# Patient Record
Sex: Female | Born: 1935
Health system: Southern US, Community
[De-identification: ages and names within clinical notes are randomized; demographics above are authoritative.]

## PROBLEM LIST (undated history)

## (undated) DIAGNOSIS — J4489 Other specified chronic obstructive pulmonary disease: Secondary | ICD-10-CM

## (undated) DIAGNOSIS — I10 Essential (primary) hypertension: Secondary | ICD-10-CM

## (undated) DIAGNOSIS — N904 Leukoplakia of vulva: Secondary | ICD-10-CM

## (undated) DIAGNOSIS — R1011 Right upper quadrant pain: Secondary | ICD-10-CM

## (undated) DIAGNOSIS — J449 Chronic obstructive pulmonary disease, unspecified: Secondary | ICD-10-CM

## (undated) DIAGNOSIS — C189 Malignant neoplasm of colon, unspecified: Secondary | ICD-10-CM

## (undated) DIAGNOSIS — M5030 Other cervical disc degeneration, unspecified cervical region: Secondary | ICD-10-CM

## (undated) DIAGNOSIS — G5680 Other specified mononeuropathies of unspecified upper limb: Secondary | ICD-10-CM

## (undated) DIAGNOSIS — B379 Candidiasis, unspecified: Secondary | ICD-10-CM

## (undated) DIAGNOSIS — M542 Cervicalgia: Secondary | ICD-10-CM

## (undated) DIAGNOSIS — M069 Rheumatoid arthritis, unspecified: Secondary | ICD-10-CM

## (undated) DIAGNOSIS — I4891 Unspecified atrial fibrillation: Secondary | ICD-10-CM

## (undated) HISTORY — DX: Right upper quadrant pain: R10.11

## (undated) HISTORY — DX: Candidiasis, unspecified: B37.9

## (undated) HISTORY — DX: Rheumatoid arthritis, unspecified: M06.9

## (undated) HISTORY — PX: APPENDECTOMY: SHX54

## (undated) HISTORY — DX: Leukoplakia of vulva: N90.4

## (undated) HISTORY — DX: Malignant neoplasm of colon, unspecified: C18.9

## (undated) HISTORY — DX: Other cervical disc degeneration, unspecified cervical region: M50.30

## (undated) HISTORY — DX: Cervicalgia: M54.2

## (undated) HISTORY — DX: Other specified mononeuropathies of unspecified upper limb: G56.80

---

## 1958-09-13 HISTORY — PX: ABDOMINAL HYSTERECTOMY: SHX81

## 2000-01-13 HISTORY — PX: CHOLECYSTECTOMY: SHX55

## 2001-05-18 ENCOUNTER — Ambulatory Visit (HOSPITAL_COMMUNITY): Admission: RE | Admit: 2001-05-18 | Discharge: 2001-05-18 | Payer: Self-pay | Admitting: Obstetrics and Gynecology

## 2001-05-18 ENCOUNTER — Encounter: Payer: Self-pay | Admitting: Obstetrics and Gynecology

## 2001-12-14 ENCOUNTER — Encounter: Payer: Self-pay | Admitting: Internal Medicine

## 2001-12-14 ENCOUNTER — Ambulatory Visit (HOSPITAL_COMMUNITY): Admission: RE | Admit: 2001-12-14 | Discharge: 2001-12-14 | Payer: Self-pay | Admitting: Internal Medicine

## 2002-05-22 ENCOUNTER — Ambulatory Visit (HOSPITAL_COMMUNITY): Admission: RE | Admit: 2002-05-22 | Discharge: 2002-05-22 | Payer: Self-pay | Admitting: Obstetrics and Gynecology

## 2002-05-22 ENCOUNTER — Encounter: Payer: Self-pay | Admitting: Obstetrics and Gynecology

## 2002-12-08 ENCOUNTER — Emergency Department (HOSPITAL_COMMUNITY): Admission: EM | Admit: 2002-12-08 | Discharge: 2002-12-09 | Payer: Self-pay | Admitting: *Deleted

## 2003-04-25 ENCOUNTER — Ambulatory Visit (HOSPITAL_COMMUNITY): Admission: RE | Admit: 2003-04-25 | Discharge: 2003-04-25 | Payer: Self-pay | Admitting: Internal Medicine

## 2003-10-10 ENCOUNTER — Ambulatory Visit (HOSPITAL_COMMUNITY): Admission: RE | Admit: 2003-10-10 | Discharge: 2003-10-10 | Payer: Self-pay | Admitting: Family Medicine

## 2003-10-11 ENCOUNTER — Ambulatory Visit (HOSPITAL_COMMUNITY): Admission: RE | Admit: 2003-10-11 | Discharge: 2003-10-11 | Payer: Self-pay | Admitting: Family Medicine

## 2003-10-13 HISTORY — PX: HEMICOLECTOMY: SHX854

## 2003-10-15 ENCOUNTER — Inpatient Hospital Stay (HOSPITAL_COMMUNITY): Admission: AD | Admit: 2003-10-15 | Discharge: 2003-10-21 | Payer: Self-pay | Admitting: Internal Medicine

## 2003-10-22 ENCOUNTER — Emergency Department (HOSPITAL_COMMUNITY): Admission: EM | Admit: 2003-10-22 | Discharge: 2003-10-22 | Payer: Self-pay | Admitting: Emergency Medicine

## 2003-11-08 ENCOUNTER — Encounter: Admission: RE | Admit: 2003-11-08 | Discharge: 2003-11-08 | Payer: Self-pay | Admitting: Oncology

## 2003-11-08 ENCOUNTER — Encounter (HOSPITAL_COMMUNITY): Admission: RE | Admit: 2003-11-08 | Discharge: 2003-12-08 | Payer: Self-pay | Admitting: Oncology

## 2003-11-19 ENCOUNTER — Ambulatory Visit (HOSPITAL_COMMUNITY): Admission: RE | Admit: 2003-11-19 | Discharge: 2003-11-19 | Payer: Self-pay | Admitting: Obstetrics and Gynecology

## 2004-01-22 ENCOUNTER — Encounter: Admission: RE | Admit: 2004-01-22 | Discharge: 2004-01-22 | Payer: Self-pay | Admitting: Oncology

## 2004-01-22 ENCOUNTER — Ambulatory Visit (HOSPITAL_COMMUNITY): Payer: Self-pay | Admitting: Oncology

## 2004-01-22 ENCOUNTER — Encounter (HOSPITAL_COMMUNITY): Admission: RE | Admit: 2004-01-22 | Discharge: 2004-02-21 | Payer: Self-pay | Admitting: Oncology

## 2004-05-07 ENCOUNTER — Encounter: Admission: RE | Admit: 2004-05-07 | Discharge: 2004-05-07 | Payer: Self-pay | Admitting: Oncology

## 2004-05-07 ENCOUNTER — Encounter (HOSPITAL_COMMUNITY): Admission: RE | Admit: 2004-05-07 | Discharge: 2004-06-06 | Payer: Self-pay | Admitting: Oncology

## 2004-05-07 ENCOUNTER — Ambulatory Visit (HOSPITAL_COMMUNITY): Payer: Self-pay | Admitting: Oncology

## 2004-08-06 ENCOUNTER — Encounter: Admission: RE | Admit: 2004-08-06 | Discharge: 2004-08-06 | Payer: Self-pay | Admitting: Oncology

## 2004-08-06 ENCOUNTER — Ambulatory Visit (HOSPITAL_COMMUNITY): Payer: Self-pay | Admitting: Oncology

## 2004-08-06 ENCOUNTER — Encounter (HOSPITAL_COMMUNITY): Admission: RE | Admit: 2004-08-06 | Discharge: 2004-09-05 | Payer: Self-pay | Admitting: Oncology

## 2004-10-29 ENCOUNTER — Ambulatory Visit (HOSPITAL_COMMUNITY): Payer: Self-pay | Admitting: Oncology

## 2004-10-29 ENCOUNTER — Encounter: Admission: RE | Admit: 2004-10-29 | Discharge: 2004-10-29 | Payer: Self-pay | Admitting: Oncology

## 2004-10-29 ENCOUNTER — Encounter (HOSPITAL_COMMUNITY): Admission: RE | Admit: 2004-10-29 | Discharge: 2004-11-28 | Payer: Self-pay | Admitting: Oncology

## 2004-11-20 ENCOUNTER — Ambulatory Visit (HOSPITAL_COMMUNITY): Admission: RE | Admit: 2004-11-20 | Discharge: 2004-11-20 | Payer: Self-pay | Admitting: Obstetrics and Gynecology

## 2004-12-07 ENCOUNTER — Emergency Department (HOSPITAL_COMMUNITY): Admission: EM | Admit: 2004-12-07 | Discharge: 2004-12-07 | Payer: Self-pay | Admitting: *Deleted

## 2005-01-21 ENCOUNTER — Encounter: Admission: RE | Admit: 2005-01-21 | Discharge: 2005-01-21 | Payer: Self-pay | Admitting: Oncology

## 2005-01-21 ENCOUNTER — Ambulatory Visit (HOSPITAL_COMMUNITY): Payer: Self-pay | Admitting: Oncology

## 2005-01-21 ENCOUNTER — Encounter (HOSPITAL_COMMUNITY): Admission: RE | Admit: 2005-01-21 | Discharge: 2005-02-20 | Payer: Self-pay | Admitting: Oncology

## 2005-05-07 ENCOUNTER — Ambulatory Visit (HOSPITAL_COMMUNITY): Payer: Self-pay | Admitting: Oncology

## 2005-05-07 ENCOUNTER — Encounter (HOSPITAL_COMMUNITY): Admission: RE | Admit: 2005-05-07 | Discharge: 2005-06-06 | Payer: Self-pay | Admitting: Oncology

## 2005-05-07 ENCOUNTER — Encounter: Admission: RE | Admit: 2005-05-07 | Discharge: 2005-05-07 | Payer: Self-pay | Admitting: Oncology

## 2005-06-04 ENCOUNTER — Ambulatory Visit (HOSPITAL_COMMUNITY): Admission: RE | Admit: 2005-06-04 | Discharge: 2005-06-04 | Payer: Self-pay | Admitting: Internal Medicine

## 2005-06-04 ENCOUNTER — Ambulatory Visit: Payer: Self-pay | Admitting: Internal Medicine

## 2005-06-04 HISTORY — PX: COLONOSCOPY: SHX174

## 2005-07-21 ENCOUNTER — Encounter (HOSPITAL_COMMUNITY): Admission: RE | Admit: 2005-07-21 | Discharge: 2005-08-20 | Payer: Self-pay | Admitting: Oncology

## 2005-07-21 ENCOUNTER — Ambulatory Visit (HOSPITAL_COMMUNITY): Payer: Self-pay | Admitting: Oncology

## 2005-07-21 ENCOUNTER — Encounter: Admission: RE | Admit: 2005-07-21 | Discharge: 2005-07-21 | Payer: Self-pay | Admitting: Oncology

## 2005-11-19 ENCOUNTER — Encounter (HOSPITAL_COMMUNITY): Admission: RE | Admit: 2005-11-19 | Discharge: 2005-12-19 | Payer: Self-pay | Admitting: Oncology

## 2005-11-19 ENCOUNTER — Encounter: Admission: RE | Admit: 2005-11-19 | Discharge: 2005-11-19 | Payer: Self-pay | Admitting: Oncology

## 2005-11-19 ENCOUNTER — Ambulatory Visit (HOSPITAL_COMMUNITY): Payer: Self-pay | Admitting: Oncology

## 2005-11-26 ENCOUNTER — Ambulatory Visit (HOSPITAL_COMMUNITY): Admission: RE | Admit: 2005-11-26 | Discharge: 2005-11-26 | Payer: Self-pay | Admitting: Family Medicine

## 2005-12-31 ENCOUNTER — Ambulatory Visit (HOSPITAL_COMMUNITY): Admission: RE | Admit: 2005-12-31 | Discharge: 2005-12-31 | Payer: Self-pay | Admitting: Family Medicine

## 2006-02-11 ENCOUNTER — Encounter (HOSPITAL_COMMUNITY): Admission: RE | Admit: 2006-02-11 | Discharge: 2006-03-13 | Payer: Self-pay | Admitting: Oncology

## 2006-02-11 ENCOUNTER — Ambulatory Visit (HOSPITAL_COMMUNITY): Payer: Self-pay | Admitting: Oncology

## 2006-05-18 ENCOUNTER — Ambulatory Visit (HOSPITAL_COMMUNITY): Payer: Self-pay | Admitting: Oncology

## 2006-05-18 ENCOUNTER — Encounter (HOSPITAL_COMMUNITY): Admission: RE | Admit: 2006-05-18 | Discharge: 2006-06-17 | Payer: Self-pay | Admitting: Oncology

## 2006-08-02 ENCOUNTER — Ambulatory Visit: Payer: Self-pay | Admitting: Critical Care Medicine

## 2006-08-03 ENCOUNTER — Ambulatory Visit: Payer: Self-pay | Admitting: Critical Care Medicine

## 2006-08-03 ENCOUNTER — Ambulatory Visit: Admission: RE | Admit: 2006-08-03 | Discharge: 2006-08-03 | Payer: Self-pay | Admitting: Critical Care Medicine

## 2006-08-05 ENCOUNTER — Encounter: Payer: Self-pay | Admitting: Critical Care Medicine

## 2006-08-05 ENCOUNTER — Ambulatory Visit: Admission: RE | Admit: 2006-08-05 | Discharge: 2006-08-05 | Payer: Self-pay | Admitting: Critical Care Medicine

## 2006-08-20 ENCOUNTER — Encounter (HOSPITAL_COMMUNITY): Admission: RE | Admit: 2006-08-20 | Discharge: 2006-09-19 | Payer: Self-pay | Admitting: Oncology

## 2006-08-20 ENCOUNTER — Ambulatory Visit (HOSPITAL_COMMUNITY): Payer: Self-pay | Admitting: Oncology

## 2006-08-31 ENCOUNTER — Ambulatory Visit: Payer: Self-pay | Admitting: Critical Care Medicine

## 2006-09-08 ENCOUNTER — Ambulatory Visit: Payer: Self-pay | Admitting: Internal Medicine

## 2006-11-29 ENCOUNTER — Encounter (HOSPITAL_COMMUNITY): Admission: RE | Admit: 2006-11-29 | Discharge: 2006-12-29 | Payer: Self-pay | Admitting: Oncology

## 2006-11-29 ENCOUNTER — Ambulatory Visit (HOSPITAL_COMMUNITY): Payer: Self-pay | Admitting: Oncology

## 2006-11-30 ENCOUNTER — Ambulatory Visit: Payer: Self-pay | Admitting: Critical Care Medicine

## 2006-11-30 DIAGNOSIS — J479 Bronchiectasis, uncomplicated: Secondary | ICD-10-CM

## 2006-11-30 DIAGNOSIS — M069 Rheumatoid arthritis, unspecified: Secondary | ICD-10-CM | POA: Insufficient documentation

## 2006-11-30 DIAGNOSIS — E785 Hyperlipidemia, unspecified: Secondary | ICD-10-CM | POA: Insufficient documentation

## 2007-02-28 ENCOUNTER — Encounter (HOSPITAL_COMMUNITY): Admission: RE | Admit: 2007-02-28 | Discharge: 2007-03-30 | Payer: Self-pay | Admitting: Oncology

## 2007-02-28 ENCOUNTER — Ambulatory Visit (HOSPITAL_COMMUNITY): Payer: Self-pay | Admitting: Oncology

## 2007-07-13 ENCOUNTER — Ambulatory Visit (HOSPITAL_COMMUNITY): Payer: Self-pay | Admitting: Oncology

## 2007-07-13 ENCOUNTER — Encounter (HOSPITAL_COMMUNITY): Admission: RE | Admit: 2007-07-13 | Discharge: 2007-08-12 | Payer: Self-pay | Admitting: Oncology

## 2007-10-05 ENCOUNTER — Encounter (HOSPITAL_COMMUNITY): Admission: RE | Admit: 2007-10-05 | Discharge: 2007-10-10 | Payer: Self-pay | Admitting: Oncology

## 2007-10-05 ENCOUNTER — Ambulatory Visit (HOSPITAL_COMMUNITY): Payer: Self-pay | Admitting: Oncology

## 2007-12-06 ENCOUNTER — Ambulatory Visit (HOSPITAL_COMMUNITY): Admission: RE | Admit: 2007-12-06 | Discharge: 2007-12-06 | Payer: Self-pay | Admitting: Obstetrics and Gynecology

## 2008-01-09 ENCOUNTER — Encounter (HOSPITAL_COMMUNITY): Admission: RE | Admit: 2008-01-09 | Discharge: 2008-02-08 | Payer: Self-pay | Admitting: Oncology

## 2008-01-09 ENCOUNTER — Ambulatory Visit (HOSPITAL_COMMUNITY): Payer: Self-pay | Admitting: Oncology

## 2008-04-09 ENCOUNTER — Encounter (HOSPITAL_COMMUNITY): Admission: RE | Admit: 2008-04-09 | Discharge: 2008-05-10 | Payer: Self-pay | Admitting: Oncology

## 2008-04-09 ENCOUNTER — Ambulatory Visit (HOSPITAL_COMMUNITY): Payer: Self-pay | Admitting: Oncology

## 2008-07-02 ENCOUNTER — Encounter (HOSPITAL_COMMUNITY): Admission: RE | Admit: 2008-07-02 | Discharge: 2008-08-01 | Payer: Self-pay | Admitting: Oncology

## 2008-07-02 ENCOUNTER — Ambulatory Visit (HOSPITAL_COMMUNITY): Payer: Self-pay | Admitting: Oncology

## 2008-08-24 ENCOUNTER — Inpatient Hospital Stay (HOSPITAL_COMMUNITY): Admission: EM | Admit: 2008-08-24 | Discharge: 2008-08-25 | Payer: Self-pay | Admitting: Emergency Medicine

## 2008-09-24 ENCOUNTER — Ambulatory Visit (HOSPITAL_COMMUNITY): Payer: Self-pay | Admitting: Oncology

## 2008-09-24 ENCOUNTER — Encounter (HOSPITAL_COMMUNITY): Admission: RE | Admit: 2008-09-24 | Discharge: 2008-10-10 | Payer: Self-pay | Admitting: Oncology

## 2008-12-10 ENCOUNTER — Ambulatory Visit (HOSPITAL_COMMUNITY): Admission: RE | Admit: 2008-12-10 | Discharge: 2008-12-10 | Payer: Self-pay | Admitting: Obstetrics and Gynecology

## 2009-02-15 ENCOUNTER — Ambulatory Visit (HOSPITAL_COMMUNITY): Payer: Self-pay | Admitting: Oncology

## 2009-02-15 ENCOUNTER — Encounter (HOSPITAL_COMMUNITY): Admission: RE | Admit: 2009-02-15 | Discharge: 2009-03-17 | Payer: Self-pay | Admitting: Oncology

## 2009-04-02 HISTORY — PX: COLONOSCOPY: SHX174

## 2009-05-08 ENCOUNTER — Encounter (HOSPITAL_COMMUNITY): Admission: RE | Admit: 2009-05-08 | Discharge: 2009-06-07 | Payer: Self-pay | Admitting: Oncology

## 2009-05-08 ENCOUNTER — Ambulatory Visit (HOSPITAL_COMMUNITY): Payer: Self-pay | Admitting: Oncology

## 2009-12-12 ENCOUNTER — Ambulatory Visit (HOSPITAL_COMMUNITY): Payer: Self-pay | Admitting: Oncology

## 2009-12-12 ENCOUNTER — Encounter (HOSPITAL_COMMUNITY)
Admission: RE | Admit: 2009-12-12 | Discharge: 2010-01-11 | Payer: Self-pay | Source: Home / Self Care | Attending: Oncology | Admitting: Oncology

## 2009-12-12 ENCOUNTER — Ambulatory Visit (HOSPITAL_COMMUNITY): Admission: RE | Admit: 2009-12-12 | Discharge: 2009-12-12 | Payer: Self-pay | Admitting: Family Medicine

## 2010-02-01 ENCOUNTER — Encounter: Payer: Self-pay | Admitting: Otolaryngology

## 2010-02-11 ENCOUNTER — Ambulatory Visit (HOSPITAL_COMMUNITY)
Admission: RE | Admit: 2010-02-11 | Discharge: 2010-02-11 | Payer: Self-pay | Source: Home / Self Care | Attending: Oncology | Admitting: Oncology

## 2010-02-11 ENCOUNTER — Encounter (HOSPITAL_COMMUNITY)
Admission: RE | Admit: 2010-02-11 | Discharge: 2010-02-11 | Payer: Self-pay | Source: Home / Self Care | Attending: Oncology | Admitting: Oncology

## 2010-02-14 ENCOUNTER — Ambulatory Visit (HOSPITAL_COMMUNITY): Payer: MEDICARE | Admitting: Oncology

## 2010-02-14 DIAGNOSIS — C189 Malignant neoplasm of colon, unspecified: Secondary | ICD-10-CM

## 2010-04-01 LAB — CEA: CEA: 0.8 ng/mL (ref 0.0–5.0)

## 2010-04-02 LAB — COMPREHENSIVE METABOLIC PANEL
ALT: 27 U/L (ref 0–35)
Alkaline Phosphatase: 45 U/L (ref 39–117)
CO2: 28 mEq/L (ref 19–32)
GFR calc non Af Amer: >60 mL/min (ref >60)
Glucose, Bld: 95 mg/dL (ref 70–99)
Potassium: 3.4 mEq/L — ABNORMAL LOW (ref 3.5–5.1)
Sodium: 139 mEq/L (ref 135–145)

## 2010-04-02 LAB — CBC
HCT: 39.8 % (ref 36.0–46.0)
Hemoglobin: 13.6 g/dL (ref 12.0–15.0)
RDW: 14.4 % (ref 11.5–15.5)

## 2010-04-02 LAB — DIFFERENTIAL
Basophils Relative: 1 % (ref 0–1)
Eosinophils Absolute: 0.2 10*3/uL (ref 0.0–0.7)
Neutrophils Relative %: 67 % (ref 43–77)

## 2010-04-18 LAB — CEA: CEA: 0.9 ng/mL (ref 0.0–5.0)

## 2010-04-20 LAB — DIFFERENTIAL
Basophils Absolute: 0 10*3/uL (ref 0.0–0.1)
Eosinophils Relative: 1 % (ref 0–5)
Lymphocytes Relative: 11 % — ABNORMAL LOW (ref 12–46)
Monocytes Absolute: 0.6 10*3/uL (ref 0.1–1.0)

## 2010-04-20 LAB — CBC
HCT: 35.5 % — ABNORMAL LOW (ref 36.0–46.0)
Hemoglobin: 12.2 g/dL (ref 12.0–15.0)
MCHC: 34.5 g/dL (ref 30.0–36.0)
MCV: 90.8 fL (ref 78.0–100.0)
Platelets: 214 10*3/uL (ref 150–400)
RDW: 14.5 % (ref 11.5–15.5)
WBC: 7.6 10*3/uL (ref 4.0–10.5)

## 2010-04-20 LAB — BASIC METABOLIC PANEL
BUN: 12 mg/dL (ref 6–23)
Calcium: 8.3 mg/dL — ABNORMAL LOW (ref 8.4–10.5)
Creatinine, Ser: 0.85 mg/dL (ref 0.4–1.2)
GFR calc non Af Amer: >60 mL/min (ref >60)
Glucose, Bld: 83 mg/dL (ref 70–99)
Sodium: 144 mEq/L (ref 135–145)

## 2010-04-20 LAB — COMPREHENSIVE METABOLIC PANEL
AST: 38 U/L — ABNORMAL HIGH (ref 0–37)
Albumin: 3.7 g/dL (ref 3.5–5.2)
Alkaline Phosphatase: 45 U/L (ref 39–117)
Chloride: 109 mEq/L (ref 96–112)
Creatinine, Ser: 0.79 mg/dL (ref 0.4–1.2)
GFR calc Af Amer: >60 mL/min (ref >60)
Potassium: 3.8 mEq/L (ref 3.5–5.1)
Total Bilirubin: 0.7 mg/dL (ref 0.3–1.2)

## 2010-04-20 LAB — CK TOTAL AND CKMB (NOT AT ARMC)
CK, MB: 2.7 ng/mL (ref 0.3–4.0)
Total CK: 81 U/L (ref 7–177)

## 2010-04-20 LAB — PROTIME-INR: Prothrombin Time: 13.4 seconds (ref 11.6–15.2)

## 2010-04-20 LAB — POCT CARDIAC MARKERS: Troponin i, poc: <0.05 ng/mL (ref 0.00–0.09)

## 2010-04-20 LAB — LIPID PANEL: VLDL: 25 mg/dL (ref 0–40)

## 2010-04-20 LAB — TROPONIN I: Troponin I: 0.03 ng/mL (ref 0.00–0.06)

## 2010-04-21 LAB — CEA: CEA: 1.2 ng/mL (ref 0.0–5.0)

## 2010-04-24 LAB — CEA: CEA: 1.5 ng/mL (ref 0.0–5.0)

## 2010-05-13 ENCOUNTER — Emergency Department (HOSPITAL_COMMUNITY): Admission: EM | Admit: 2010-05-13 | Payer: Medicare Other | Source: Home / Self Care

## 2010-05-13 ENCOUNTER — Encounter (HOSPITAL_COMMUNITY): Payer: Medicare Other | Attending: Oncology

## 2010-05-13 DIAGNOSIS — C189 Malignant neoplasm of colon, unspecified: Secondary | ICD-10-CM

## 2010-05-13 DIAGNOSIS — Z85038 Personal history of other malignant neoplasm of large intestine: Secondary | ICD-10-CM | POA: Insufficient documentation

## 2010-05-13 DIAGNOSIS — I10 Essential (primary) hypertension: Secondary | ICD-10-CM | POA: Insufficient documentation

## 2010-05-13 DIAGNOSIS — M069 Rheumatoid arthritis, unspecified: Secondary | ICD-10-CM | POA: Insufficient documentation

## 2010-05-13 DIAGNOSIS — Z79899 Other long term (current) drug therapy: Secondary | ICD-10-CM | POA: Insufficient documentation

## 2010-05-22 ENCOUNTER — Other Ambulatory Visit (HOSPITAL_COMMUNITY): Payer: Self-pay | Admitting: Family Medicine

## 2010-05-22 DIAGNOSIS — M069 Rheumatoid arthritis, unspecified: Secondary | ICD-10-CM

## 2010-05-27 ENCOUNTER — Ambulatory Visit (HOSPITAL_COMMUNITY)
Admission: RE | Admit: 2010-05-27 | Discharge: 2010-05-27 | Disposition: A | Discharge status: Home / Self Care | Payer: Medicare Other | Source: Ambulatory Visit | Attending: Family Medicine | Admitting: Family Medicine

## 2010-05-27 ENCOUNTER — Encounter (HOSPITAL_COMMUNITY): Payer: Self-pay

## 2010-05-27 DIAGNOSIS — IMO0002 Reserved for concepts with insufficient information to code with codable children: Secondary | ICD-10-CM | POA: Insufficient documentation

## 2010-05-27 DIAGNOSIS — M069 Rheumatoid arthritis, unspecified: Secondary | ICD-10-CM

## 2010-05-27 DIAGNOSIS — Z780 Asymptomatic menopausal state: Secondary | ICD-10-CM | POA: Insufficient documentation

## 2010-05-27 HISTORY — DX: Essential (primary) hypertension: I10

## 2010-05-27 NOTE — Op Note (Signed)
[name], [name]             ACCOUNT NO.:  [id number]   MEDICAL RECORD NO.:  [id number]          PATIENT TYPE:  AMB   LOCATION:  CARD                         FACILITY:  [facility]   PHYSICIAN:  Charlcie Cradle. Delford Field, MD, FCCPDATE OF BIRTH:  01-30-35   DATE OF PROCEDURE:  08/05/2006  DATE OF DISCHARGE:                               OPERATIVE REPORT   PROCEDURE PERFORMED:  Bronchoscopy.   CHIEF COMPLAINT:  Right lower lobe bronchiectasis, history of rheumatoid  arthritis and immunosuppressive therapy, methotrexate use, evaluate for  drug toxicity versus opportunistic infection with immunosuppression.   OPERATOR:  Charlcie Cradle. Delford Field, M.D., [facility].   ANESTHESIA:  1% Xylocaine local.   PREOPERATIVE MEDICATION:  Fentanyl 30 mcg, Versed 3 mg IV push.   PROCEDURE:  The video bronchoscope was introduced via the left naris.  The upper airways were visualized and were unremarkable.  The entire  tracheobronchial tree was visualized and revealed diffuse  tracheobronchitis with thin white secretions seen.  No endobronchial  lesions were seen.  Attention was then paid to the right lower lobe  medial basilar segment.  Bronchoalveolar lavage was obtained, 120 mL  volume infused, half this volume was returned. Transbronchial biopsies  x8 eight were obtained.   COMPLICATIONS:  None.   IMPRESSION:  Bronchiectasis right lower lobe with rheumatoid arthritis  and methotrexate use, evaluate for immunosuppressed infection versus  drug toxicity.   RECOMMENDATIONS:  Follow up microbiology and pathology.      Charlcie Cradle Delford Field, MD, [facility]  Electronically Signed     PEW/MEDQ  D:  08/05/2006  T:  08/05/2006  Job:  [id number]   cc:   Madelin Rear. Sherwood Gambler, MD  Fax: [phone]   Lemmie Evens, M.D.  Fax: [phone]

## 2010-05-27 NOTE — Assessment & Plan Note (Signed)
[facility]                             PULMONARY OFFICE NOTE   MECHELLE, PATES                      MRN:          [id number]  DATE:09/08/2006                            DOB:          1933-05-13    HISTORY:  A 77 year old white female, never smoker with documented  bronchiectasis by CT scan dated Jun 03, 2006 but normal lung function  tests on August 03, 2006 while on Symbicort 160/4.5 two puffs b.i.d. which  she feels has improved her breathing.  However, she continues to be  short of breath doing anything more than light housework for more than 5  or 10 minutes, after that she has to stop and rest.  She denies any  variability in terms of her symptoms with weather or environmental  changes and denies any significant fevers, chills, sweats or excessive  sputum production, pleuritic or exertional chest pain, orthopnea, PND or  leg swelling.   She comes in today acutely worse over the last week with tremors, rash,  face rash, tongue sore and chest discomfort that she has all read from  the package insert as side effects of taking Symbicort and stopped  medicine yesterday but is not sure she feels better yet.  She is mostly  concerned because it is making her break out.   MEDICATIONS:  Inventoried on the face sheet column dated September 08, 2006  and do include Toprol at a dose of 50 mg one whole daily and tapering  course of prednisone.   PHYSICAL EXAMINATION:  She is an anxious but pleasant ambulatory white  female in no acute distress.  She was afebrile, normal vital signs.  HEENT:  Unremarkable, oropharynx clear.  LUNGS:  Fields are completely clear bilaterally to auscultation and  percussion.  Regular rhythm without murmur, gallop, rub.  ABDOMEN:  Soft, benign.  EXTREMITIES:  Warm without calf tenderness, cyanosis, clubbing, edema.  Skin:  No rash seen.   IMPRESSION:  Possible adverse drug effect.  I think it is unlikely that  Symbicort is  the cause of all of her problems but she looks so good and  her lung functions are so normal I think it would be fine to stop the  Symbicort and then re-challenge her if she develops any symptoms of  dyspnea with Symbicort dose 160/4.5 one puff q.6prn dysnea (certainly  not an approved dose in the [place] but used this way in [place]  and can be at least tried this way) to see to what extent it helps her  with her symptoms or re-exacerbates or contributes to any of the  symptoms that she is describing.  That is, if she has no need for  Symbicort in terms of change in dyspnea on exertion then there is no  need to even re-challenge her, but if she does need it she can use it at  1 puff every 6 hours to see to what extent it helps her symptoms of  dyspnea and does or does not recreate some of the nonspecific symptoms  she complained about today.   I see no  evidence of thrush but did demonstrate for her optimal use of  the MDI and rinse and gargle after use.   I note that some of the nonspecific complaints she has are related to  tapering off of prednisone which I have encouraged her to continue under  Dr. Jeanine Luz direction and see her back ihere  right away f she notices a need for Symbicort but is truly intolerant of  the drug for alternative bronchodilator regimen.     Charlaine Dalton. Sherene Sires, MD, [facility]  Electronically Signed    MBW/MedQ  DD: 09/08/2006  DT: 09/09/2006  Job #: [id number]   cc:   Lemmie Evens, M.D.

## 2010-05-27 NOTE — Assessment & Plan Note (Signed)
[facility]                             PULMONARY OFFICE NOTE   [name], [name]                      MRN:          [id number]  DATE:08/31/2006                            DOB:          28-Jul-1933    Ms. [name] returns in followup a 77 year old white female history of  chronic dyspnea, bronchiectasis right lower lobe, underlying rheumatoid  arthritis, rule out __________ drug toxicity. Overall she has improved  since starting Symbicort 160/4.5 at 2 sprays b.i.d. She had undergone  bronchoscopy, results showed chronic inflammation, focal goblet cell  metaplasia, cultures were negative on the BAL. CT scan of the chest had  previously shown mild pleural parenchymal scarring and medial right  lower lobe associated bronchiectasis. There was no evidence on the  biopsy for drug induced toxicity. In the interim she has stopped the  chronic Ceftin therapy. She is maintaining;  1. Leflunomide at 20 mg daily.  2. Prednisone now 7.5 mg daily.  Other maintenance medicines are unchanged except that she is no longer  on tumeric or folic acid.   On exam, temperature 98, blood pressure 130/78, pulse 78, saturation 94%  room air.  CHEST: Showed to be clear except for a few scattered squeaks and expired  wheezes at the right base.  CARDIAC EXAM: Showed a regular rate and rhythm without S3, normal S1,  S2.  ABDOMEN: Soft, nontender.  EXTREMITIES: Showed no edema or clubbing.   IMPRESSION:  That of bronchiectasis with stable airflow function  positive response to Symbicort therapy. Plan is to maintain Symbicort as  is and return to this office in 3 months.     Charlcie Cradle Delford Field, MD, [facility]  Electronically Signed    PEW/MedQ  DD: 08/31/2006  DT: 09/01/2006  Job #: [id number]   cc:   Lemmie Evens, M.D.

## 2010-05-27 NOTE — Assessment & Plan Note (Signed)
[facility]                             PULMONARY OFFICE NOTE   RAQUELLE, PIETRO                      MRN:          [id number]  DATE:11/30/2006                            DOB:          1933-06-12    Ms. [name] is a 77 year old white female with a history of rheumatoid  arthritis, bronchiectasis, right lower lobe.  We had this patient on  Symbicort in August, but she developed a soreness to the tongue, in  which she had to stop this medication.  She states that her breathing is  doing well.  Her cough is improved.  She is currently having no  respiratory complaints.   PHYSICAL EXAMINATION:  Temp 98, blood pressure 132/76, pulse 80,  saturation 92% on room air.  CHEST:  Diminished breath sounds with prolonged expiratory phase.  No  wheeze or rhonchi noted.  CARDIAC:  Regular rate and rhythm without S3.  Normal S1 and S2.  ABDOMEN:  Soft, nontender.  EXTREMITIES:  No clubbing or edema.  SKIN:  Clear.   IMPRESSION:  Bronchiectasis, right lower lobe, with associated  rheumatoid arthritis.  Stable at this time.   PLAN:  Maintain off inhaled medications and return this patient for a  recheck in four months.     Charlcie Cradle Delford Field, MD, [facility]  Electronically Signed    PEW/MedQ  DD: 11/30/2006  DT: 11/30/2006  Job #: [id number]   cc:   Lemmie Evens, M.D.

## 2010-05-27 NOTE — Discharge Summary (Signed)
[name], [name]             ACCOUNT NO.:  [id number]   MEDICAL RECORD NO.:  [id number]          PATIENT TYPE:  INP   LOCATION:  2502                         FACILITY:  [facility]   PHYSICIAN:  Antonieta Iba, MD   DATE OF BIRTH:  06-May-1933   DATE OF ADMISSION:  08/24/2008  DATE OF DISCHARGE:  08/25/2008                               DISCHARGE SUMMARY   DISCHARGE DIAGNOSES:  1. Left shoulder and left arm pain secondary to rheumatoid arthritis.      a.     Negative myocardial infarction.      b.     Patent coronary arteries.  2. Rheumatoid arthritis.  3. Chronic steroid use.  4. Abnormal EKG with no cardiac reason found on cardiac      catheterization.   DISCHARGE CONDITION:  Improved.   PROCEDURES:  Combined left heart catheterization on August 24, 2008, by  Dr. Julieanne Manson with patent coronary arteries.   DISCHARGE MEDICATIONS:  1. Prednisone 5 mg daily.  2. Toprol-XL 50 mg daily.  3. Leflunomide 20 mg daily.  4. Aspirin 81 mg daily.   DISCHARGE INSTRUCTIONS:  1. Increase activity slowly.  May shower.  No lifting for 1 week.  No      driving for 2 days.  No sexual activity for 2 days.  2. Low-sodium, heart-healthy diet.  3. Wash cath site with soap and water.  Call if any bleeding,      swelling, or drainage.  4. Follow up with Dr. Regino Schultze next week.  Call for appointment.  5. Follow up with Dr. Mariah Milling if needed.   HOSPITAL COURSE:  The patient was admitted secondary to left shoulder  and left arm pain and abnormal EKG with T-wave inversion in V2-V5.  She  went to the Cath Lab more urgently and was found to have patent coronary  arteries and normal systolic function with EF of 60%.  She then  underwent CT of the chest to rule out PE and this again was negative for  pulmonary embolus, questionable acute viral or atypical pulmonary  infection, but she related she had problems with her lungs in the past  and actually had biopsy that was negative.   The patient  tolerated cardiac catheterization well by the next morning,  August 25, 2008, and she ambulated without problems.   PHYSICAL EXAMINATION:  VITAL SIGNS:  Blood pressure 140/70, pulse 66,  respirations 16, temp 97.9, and oxygen saturation 93% on room air.  HEART:  Regular rate and rhythm.  LUNGS:  Clear to auscultation with few diffuse rales.  ABDOMEN:  Soft and nontender.  EXTREMITIES: No edema.  Groin cath site stable.  NEURO:  Alert and oriented.  SKIN:  Warm and dry.   LABORATORY DATA:  Hemoglobin 12.2, hematocrit 35, platelets 192, and WBC  5.8.  TSH 0.764.  The patient was felt ready for discharge home and will  follow up as an outpatient with Dr. Regino Schultze and she can return to Dr.  Mariah Milling if any other cardiac issues arise.  We will be delighted to see  her.  Dr. Mariah Milling saw her  and discharged her home.      [name], N.P.      Antonieta Iba, MD  Electronically Signed    LRI/MEDQ  D:  08/25/2008  T:  08/25/2008  Job:  [id number]   cc:   Kirk Ruths, M.D.

## 2010-05-27 NOTE — H&P (Signed)
[name], [name]             ACCOUNT NO.:  [id number]   MEDICAL RECORD NO.:  [id number]          PATIENT TYPE:  EMS   LOCATION:  MAJO                         FACILITY:  [facility]   PHYSICIAN:  Cassandra Iba, MD   DATE OF BIRTH:  April 22, 1933   DATE OF ADMISSION:  08/24/2008  DATE OF DISCHARGE:                              HISTORY & PHYSICAL   CHIEF COMPLAINT:  Chest pain.   HISTORY OF PRESENT ILLNESS:  [name] is a pleasant 77 year old  female from [place] with no prior history of coronary disease.  She  is followed by [name].  She presented to [name] office today  with complaints of left shoulder and left arm pain.  She described this  as a dull ache.  She had taken Aleve at home without relief of her  symptoms.  At [name] office her EKG showed sinus rhythm with T-  wave inversion in V2 and V5.  This was a change from her previous EKGs.  Her EKG in the emergency room now shows a biphasic T-wave in V2, in V3  and V4.  There is no significant ST elevation.  The patient continues to  have some left arm pain.  She denies any associated nausea, vomiting,  diaphoresis or shortness of breath.   PAST MEDICAL HISTORY:  Remarkable for rheumatoid arthritis, she is  followed by [name].  She has a history of colon cancer and had  surgery in [place] in October of 2005.  She has treated hypertension.  She has had previous cholecystectomy, hysterectomy and appendectomy.   CURRENT MEDICATIONS:  1. Leflunomide 20 mg a day.  2. Toprol-XL 50 mg a day.  3. Prednisone 5 mg a day.   ALLERGIES:  She has no known drug allergies.   SOCIAL HISTORY:  She is married.  She never smoked.  She has 4 children,  11 grandchildren and 1 great-grandchild.  She lives in [place] with  her husband.   FAMILY HISTORY:  Is unremarkable for coronary disease, her father died  at an elderly age of complications of heart failure.   REVIEW OF SYSTEMS:  Essentially unremarkable  except for noted above.  She denies any GI bleeding or melena.  She has not had thyroid disease  or kidney disease.  She has not had palpitations, syncope or recent  fever or chills.  Her arm pain is not exacerbated by movement.  She does  admit to occasional red blood on her tissue which she attributes to  hemorrhoids.   PHYSICAL EXAMINATION:  VITAL SIGNS:  Blood pressure 143/63, pulse 75,  temperature 98.2, O2 is 96% on room air.  GENERAL:  She is a well-developed, well-nourished female in no acute  distress.  HEENT:  Normocephalic.  Extraocular movements are intact.  She wears  glasses.  NECK:  Without JVD.  She does have a soft short right carotid bruit.  CHEST:  Clear to auscultation and percussion.  CARDIAC:  Reveals regular rate and rhythm without obvious murmur, rub or  gallop.  Normal S1, S2.  ABDOMEN:  Nontender.  Nondistended.  EXTREMITIES:  Without  edema.  Distal pulses are 3+/4 bilaterally.  NEUROLOGICAL:  Exam is grossly intact.  She is awake, alert, oriented  and cooperative.  Moves all extremities without obvious deficit.  SKIN:  Skin is cool and dry.   LABS:  Are pending.   IMPRESSION:  1. Unstable angina.  2. Abnormal EKG.  3. Treated hypertension.  4. History of rheumatoid arthritis, on steroids, followed by Dr.      Jimmy Nguyen.  5. History of colon cancer, status post surgery October of 2005 in      [place].   PLAN:  The patient will be admitted to [facility] and started on heparin and  nitrates, beta-blocker, aspirin, statin and a proton pump inhibitor.  She will most likely require diagnostic catheterization.  She will be  seen today by [name] to making a final disposition.      Abelino Derrick, P.A.      Cassandra Iba, MD  Electronically Signed    LKK/MEDQ  D:  08/24/2008  T:  08/24/2008  Job:  [id number]   cc:   Cassandra Iba, MD

## 2010-05-27 NOTE — Cardiovascular Report (Signed)
[name], [name]             ACCOUNT NO.:  [id number]   MEDICAL RECORD NO.:  [id number]          PATIENT TYPE:  INP   LOCATION:  2502                         FACILITY:  [facility]   PHYSICIAN:  Thereasa Solo. Little, M.D. DATE OF BIRTH:  [birth year]   DATE OF PROCEDURE:  08/24/2008  DATE OF DISCHARGE:                            CARDIAC CATHETERIZATION   INDICATIONS FOR TEST:  This 77 year old female has a history of  rheumatoid arthritis and some rheumatoid lung involvement.  She awoke  this morning with the most severe shoulder and left arm pain she had  ever had.  She was seen urgently in Dr. Edison Simon office where an EKG  showed T-wave inversion in V2 through V5.  She came to the emergency  room here.  Her repeat cardiogram showed almost complete resolution of  the T-wave abnormalities, but she was still having 5/10 shoulder pain.   After obtaining informed consent, the patient was prepped and draped in  the usual sterile fashion exposing the right groin.  Following local  anesthetic with 1% Xylocaine, the Seldinger technique was employed, and  a 5-French introducer sheath was placed in the right femoral artery.  Left and right coronary arteriography and ventriculography in the RAO  projection was performed.   COMPLICATIONS:  None.   EQUIPMENT:  5-French Judkins configuration catheters.   TOTAL CONTRAST USED:  70 mL.   RESULTS:  1. Hemodynamic monitoring; central aortic pressure was 141/63.  Left      ventricular pressure was 142/3 and at the time of pullback, there      was no aortic valve gradient.  2. Ventriculography.  Ventriculography in the RAO projection performed      at the end of the procedure using 25 mL of contrast at 12 mL/sec      revealed normal LV systolic function with ejection fraction excess      of 60% and no mitral regurgitation.   Coronary arteriography:  Mild calcification seen in the left main,  proximal LAD, and proximal circ.   1. Left main normal.   It bifurcated.  2. Circumflex.  The circumflex gave rise to 2 medium size OM vessels,      both the OMs and the circumflex itself were free of disease.  3. LAD.  The LAD crossed the apex of the heart.  There was a diagonal      that came off in the midportion, it was a medium-sized vessel.      This entire system was free of disease.  4. Right coronary artery.  The right coronary artery including the PDA      and posterior lateral vessels were all free of disease.   CONCLUSION:  1. Normal left ventricular function.  2. No evidence of epicardial coronary artery disease.   I cannot explain her shoulder pain/arm pain secondary to her coronary  anatomy.  I suspect this is musculoskeletal/rheumatoid related.  Because  it is already 3 in the afternoon, I planned to keep her overnight, start  her on pain medications, check her shoulder x-ray tomorrow,  and we will  let her followup with  Dr. Regino Schultze and Dr. Jimmy Footman.           ______________________________  Thereasa Solo. Little, M.D.     ABL/MEDQ  D:  08/24/2008  T:  08/25/2008  Job:  [id number]   cc:   Madelin Rear. Sherwood Gambler, MD  Kirk Ruths, M.D.  Cath Lab

## 2010-05-27 NOTE — Assessment & Plan Note (Signed)
[facility]                             PULMONARY OFFICE NOTE   [name], [name]                      MRN:          [id number]  DATE:08/02/2006                            DOB:          12-05-1933    CHIEF COMPLAINT:  This 77 year old female here for second opinion on  pulmonary status.  She has been seen by Dr. Shaune Pollack, previously  diagnosed with bronchiectasis and placed on Ceftin 250 mg daily for the  first 7 days of each month for life.  Her first dose of this was earlier  this month.  She has a history of rheumatoid arthritis for which she  stopped methotrexate because of dyspnea in February of 2007 after 2-1/2  months therapy.  She has abnormal chest x-rays and referred for further  evaluation of this.  Currently her symptom is that she clears her throat  a lot.  Her shortness of breath is better but is with exertion only.  She is not shortness of breath at rest.  The cough is productive of  clear mucus.  She is also on Leflunomide, she has been on this for one  month.  Also on prednisone at 10 mg a day, not on any inhaled  medications.  Denies any chest pain.  Mucus is light but occasionally  green.  She does have rheumatoid arthritis involving all joints.  She is  a lifelong never smoker.  She denies any acid reflux, no loss of  appetite, weight change, abdominal pain, difficulty swallowing.  She  denies sore throat, nasal congestion, sneezing, itching, ear ache,  anxiety or depression.  She is referred for evaluation of pulmonary  status.   PAST MEDICAL HISTORY:  1. Hypertension.  2. Hypercholesterolemia.  3. History of colon cancer 2005.   OPERATIVE HISTORY:  1. Cholecystectomy in 1995.  2. Colon surgery 2005.  3. Partial hysterectomy in 1967.  4. Fractured hand in the past and arthritis as noted.   MEDICATION ALLERGIES:  NONE.   CURRENT MEDICATIONS:  1. Leflunomide 20 mg daily.  2. Ceftin 250 mg 2 tablets 7 days of each  month.  3. Prednisone 10 mg daily.  4. Zetia 5 mg daily.  5. Toprol XL 50 mg daily.  6. Folic acid 1 mg daily.  7. Vitamin D a week every other week.  8. Calcium.  9. Omega 3.  10.Tumeric daily.   SOCIAL HISTORY:  Lifelong never smoker, retired, lives at home with her  husband.   FAMILY HISTORY:  Asthma in a sister, heart disease in her father.   REVIEW OF SYSTEMS:  Otherwise noncontributory.   PHYSICAL EXAMINATION:  This is a mildly obese white female in no  distress.  Temperature 98, blood pressure 158/90, pulse 77, saturation 94% room  air, weight 182 pounds.  CHEST:  Showed a few expiratory squeaks in the right lower lung zone, no  other wheeze is determined.  CARDIAC:  Showed a regular rate and rhythm, without S3, normal S1, S2.  ABDOMEN:  Soft, nontender.  EXTREMITIES:  Showed no edema or clubbing.  SKIN:  Clear.  NEUROLOGIC:  Intact.  HEENT:  Showed no jugular venous distention, no lymphadenopathy,  oropharynx clear.  NECK:  Supple.   LABORATORY DATA:  Chest CT from May 2008 revealed bronchiectatic changes  right middle and right lower lobe.  No other acute infiltrates seen.  There is no evidence of metastatic disease.  There is mild pleural  parenchymal scarring in the right lower lobe noted.  White count from  May 2008 showed white count 8700, hemoglobin 13.9.  Liver functions  unremarkable.  Strongly positive anti-CCP antibody, greater than 100 is  noted from May 2008.  In June 2008 she had a hemoglobin of 13.4, white  count 11.6.  The CMET is unremarkable in June 2008 with the exception of  serum blood sugar 161.  Antimyeloperoxidase antibody is 1 which is  essentially negative.  The ANCA titers were negative.  The CCP antibody  again was positive.  No pulmonary functions have been yet obtained.   IMPRESSION:  That of bronchiectasis right middle and lower lobe with  associated scarring.  In this patient we need to evaluate for  immunosuppressed state with  rheumatoid arthritis treatment versus  toxicity from current medications including Leflunomide.  Also need to  evaluate for chronic colonization with mycobacterium avium  intracellulare.   PLAN:  Pursue pulmonary functions on August 03, 2006 and pursue  bronchoscopy on August 05, 2006.  Once these results are available,  further recommendations would follow.  No change in medications were  made today.     Charlcie Cradle Delford Field, MD, [facility]  Electronically Signed    PEW/MedQ  DD: 08/03/2006  DT: 08/03/2006  Job #: [id number]   cc:   Lemmie Evens, M.D.

## 2010-05-30 NOTE — Procedures (Signed)
NAMEJEREMIE, [name]             ACCOUNT NO.:  [id number]   MEDICAL RECORD NO.:  [id number]          PATIENT TYPE:  OUT   LOCATION:  RAD                           FACILITY:  [facility]   PHYSICIAN:  Nicki Guadalajara, M.D.     DATE OF BIRTH:  Feb 05, 1933   DATE OF PROCEDURE:  10/11/2003  DATE OF DISCHARGE:                                  ECHOCARDIOGRAM   PROCEDURE:  2-D echo Doppler study.   INDICATIONS:  [name] is a 77 year old patient of Dr. Phillips Odor  who has recently noted increasing episodes of dyspnea on exertion and  shortness of breath.  The patient is referred for a 2-D Doppler study to  assess LV size, wall thickness and valvular architecture.   FINDINGS:  1.  Technically adequate M-mode 2-Dimensional comprehensive Doppler      echocardiogram.  2.  There is evidence for concentric left ventricular hypertrophy with wall      thickness measuring 1.4 cm septally and 1.2 cm posteriorly.  Systolic      function is normal.  End-diastolic and end-systolic dimensions are      normal at 4.2 and 2.6 cm, respectively.  Diastolic parameters appeared      normal.  3.  Left atrial dimension is mildly increased at 4.4 cm.  The right atrium      is upper normal in size.  The right ventricle is normal size and      function.  4.  The aortic root dimension is normal at 3.2 cm.  5.  There is minimal sclerosis of a trileaflet aortic valve.  Systolic      excursion is excellent.  There is mild aortic insufficiency.  6.  There is a predominant moderate posterior mitral annular calcification      of the mitral valve.  Leaflets appeared minimally thickened, but had      normal excursion.  Frank mitral valve prolapse was not demonstrated.      There was mild mitral regurgitation.  7.  There was mild tricuspid regurgitation.  8.  Pulmonic valve was structurally normal.  9.  There were no intramyocardial masses, thrombi or effusions seen.   IMPRESSION:  Technically, this was an adequate echo  Doppler study  demonstrating mild concentric left ventricular hypertrophy with normal left  ventricular systolic as well as diastolic function.  There is very mild  aortic valve sclerosis with mild aortic insufficiency.  There is mitral  annular calcification with mild mitral regurgitation as well as mild left  atrial enlargement, and mild tricuspid regurgitation.      TK/MEDQ  D:  10/11/2003  T:  10/11/2003  Job:  [id number]   cc:   Corrie Mckusick, M.D.  [address] [place]. [place]  [place]  [place] [address]  Fax: [phone]

## 2010-08-01 ENCOUNTER — Other Ambulatory Visit (HOSPITAL_COMMUNITY): Payer: Self-pay | Admitting: Family Medicine

## 2010-08-01 DIAGNOSIS — M5090 Cervical disc disorder, unspecified, unspecified cervical region: Secondary | ICD-10-CM

## 2010-08-04 ENCOUNTER — Ambulatory Visit (HOSPITAL_COMMUNITY)
Admission: RE | Admit: 2010-08-04 | Discharge: 2010-08-04 | Disposition: A | Discharge status: Home / Self Care | Payer: Medicare Other | Source: Ambulatory Visit | Attending: Family Medicine | Admitting: Family Medicine

## 2010-08-04 DIAGNOSIS — M5020 Other cervical disc displacement, unspecified cervical region: Secondary | ICD-10-CM | POA: Insufficient documentation

## 2010-08-04 DIAGNOSIS — M542 Cervicalgia: Secondary | ICD-10-CM | POA: Insufficient documentation

## 2010-08-04 DIAGNOSIS — M5090 Cervical disc disorder, unspecified, unspecified cervical region: Secondary | ICD-10-CM

## 2010-08-05 ENCOUNTER — Encounter (HOSPITAL_COMMUNITY): Payer: Medicare Other | Attending: Oncology

## 2010-08-05 DIAGNOSIS — Z79899 Other long term (current) drug therapy: Secondary | ICD-10-CM | POA: Insufficient documentation

## 2010-08-05 DIAGNOSIS — I10 Essential (primary) hypertension: Secondary | ICD-10-CM | POA: Insufficient documentation

## 2010-08-05 DIAGNOSIS — Z85038 Personal history of other malignant neoplasm of large intestine: Secondary | ICD-10-CM | POA: Insufficient documentation

## 2010-08-05 DIAGNOSIS — C189 Malignant neoplasm of colon, unspecified: Secondary | ICD-10-CM

## 2010-08-05 DIAGNOSIS — M069 Rheumatoid arthritis, unspecified: Secondary | ICD-10-CM | POA: Insufficient documentation

## 2010-08-05 NOTE — Progress Notes (Signed)
Labs drawn today for cea 

## 2010-08-07 ENCOUNTER — Other Ambulatory Visit: Payer: Self-pay | Admitting: Neurosurgery

## 2010-08-07 DIAGNOSIS — M47812 Spondylosis without myelopathy or radiculopathy, cervical region: Secondary | ICD-10-CM

## 2010-08-12 ENCOUNTER — Ambulatory Visit
Admission: RE | Admit: 2010-08-12 | Discharge: 2010-08-12 | Disposition: A | Discharge status: Home / Self Care | Payer: Medicare Other | Source: Ambulatory Visit | Attending: Neurosurgery | Admitting: Neurosurgery

## 2010-08-12 ENCOUNTER — Observation Stay (HOSPITAL_COMMUNITY)
Admission: EM | Admit: 2010-08-12 | Discharge: 2010-08-13 | Disposition: A | Discharge status: Home / Self Care | Payer: Medicare Other | Attending: Internal Medicine | Admitting: Internal Medicine

## 2010-08-12 VITALS — BP 63/38 | HR 61 | Resp 22

## 2010-08-12 DIAGNOSIS — M129 Arthropathy, unspecified: Secondary | ICD-10-CM | POA: Insufficient documentation

## 2010-08-12 DIAGNOSIS — H8110 Benign paroxysmal vertigo, unspecified ear: Secondary | ICD-10-CM | POA: Insufficient documentation

## 2010-08-12 DIAGNOSIS — T782XXA Anaphylactic shock, unspecified, initial encounter: Principal | ICD-10-CM | POA: Insufficient documentation

## 2010-08-12 DIAGNOSIS — M47812 Spondylosis without myelopathy or radiculopathy, cervical region: Secondary | ICD-10-CM

## 2010-08-12 DIAGNOSIS — K219 Gastro-esophageal reflux disease without esophagitis: Secondary | ICD-10-CM | POA: Insufficient documentation

## 2010-08-12 DIAGNOSIS — I10 Essential (primary) hypertension: Secondary | ICD-10-CM | POA: Insufficient documentation

## 2010-08-12 DIAGNOSIS — Z85038 Personal history of other malignant neoplasm of large intestine: Secondary | ICD-10-CM | POA: Insufficient documentation

## 2010-08-12 LAB — DIFFERENTIAL
Basophils Absolute: 0 10*3/uL (ref 0.0–0.1)
Basophils Relative: 0 % (ref 0–1)
Eosinophils Absolute: 0.1 10*3/uL (ref 0.0–0.7)
Monocytes Relative: 5 % (ref 3–12)
Neutrophils Relative %: 46 % (ref 43–77)

## 2010-08-12 LAB — BASIC METABOLIC PANEL
CO2: 21 mEq/L (ref 19–32)
Calcium: 9 mg/dL (ref 8.4–10.5)
GFR calc non Af Amer: >60 mL/min (ref >60)
Sodium: 138 mEq/L (ref 135–145)

## 2010-08-12 LAB — CBC
MCH: 29.9 pg (ref 26.0–34.0)
Platelets: 210 10*3/uL (ref 150–400)
RBC: 4.81 MIL/uL (ref 3.87–5.11)

## 2010-08-12 MED ORDER — DIPHENHYDRAMINE HCL 25 MG PO CAPS
50.0000 mg | ORAL_CAPSULE | Freq: Once | ORAL | Status: AC
  Administered 2010-08-12: 50 mg via ORAL

## 2010-08-12 MED ORDER — EPINEPHRINE HCL 1 MG/ML IJ SOLN
0.2000 mg | Freq: Once | INTRAMUSCULAR | Status: AC
  Administered 2010-08-12: 0.2 mg via SUBCUTANEOUS

## 2010-08-12 MED ORDER — IOHEXOL 300 MG/ML  SOLN
1.0000 mL | Freq: Once | INTRAMUSCULAR | Status: AC | PRN
  Administered 2010-08-12: 1 mL via EPIDURAL

## 2010-08-12 MED ORDER — TRIAMCINOLONE ACETONIDE 40 MG/ML IJ SUSP (RADIOLOGY)
60.0000 mg | Freq: Once | INTRAMUSCULAR | Status: AC
  Administered 2010-08-12: 60 mg via EPIDURAL

## 2010-08-12 NOTE — Progress Notes (Signed)
Received pt to nursing station approx 0930 after CEPI.  Within one to two minutes patient began complaining of itching on arms, trunk.  Rash over these areas developed within two minutes.  Benadryl 50mg  PO given 0934.  By 1610 patient unresponsive in chair with eyes rolled back in head to upper right, jerking gently all over.  Assistance summoned; chair reclined in shock postion.  O2 added at 2L/min/[place] for O2 sats 88%.  Dr. Alfredo Batty at patient's side; patient responsive and pale within a minute of lying flat.  Epinephrine 1:1000, 0.2cc given SQ right arm.  EKG at [phone] showed SR with frequent PACs, rare PVCs, possible AF runs versus artifact.  Unable to obtain strip.  NS 250cc hung wide open through 22g  PIV left AC space.  O2 increased to 4L; sats remained mid-80's.  EMS arrived quickly; left with patient to [facility] ED at [phone].  Pt's husband given purse and shirt; Dr. Alfredo Batty had brought him in to see patient and to speak with him around 0945.  Details given to [facility], RN in ED approx 1040, including procedure and contents of CEPI.  jkl

## 2010-08-15 NOTE — H&P (Signed)
NAMEVERDIE, WILMS NO.:  [id number]  MEDICAL RECORD NO.:  [id number]  LOCATION:  3743                         FACILITY:  [facility]  PHYSICIAN:  Candelaria Celeste, DO      DATE OF BIRTH:  [birth year]  DATE OF ADMISSION:  08/12/2010 DATE OF DISCHARGE:                             HISTORY & PHYSICAL   PRIMARY CARE PROVIDER:  Corrie Mckusick, MD  CHIEF COMPLAINT:  Anaphylaxis with hypertension.  Ms. Shands is a 77 year old Caucasian female who presents with a past medical history including arthritis, hypertension, history of colon cancer status post colectomy and reflux who was brought to the emergency department by EMS after an anaphylactic reaction.  [facility] and undergoing a epidural injection to the cervical spine which contained 1 mL each of Kenalog IV contrast and lidocaine. The patient underwent the injection and approximately 5 minutes later, had acute onset of itching rash.  The patient was unresponsive for about 30 seconds.  The patient was given 50 mg of Benadryl and one dose of an EpiPen by EMS.  The patient did have hypertension to 60-80 systolically. The patient had 1-2 more doses of epinephrine in the emergency department to keep her blood pressure stabilized.  The patient currently denies symptoms of itching, rash, lightheadedness, chest pain, shortness of breath, nausea, abdominal pain.  She does have mild dizziness at the present time.  PAST MEDICAL HISTORY: 1. Hypertension. 2. Arthritis. 3. Gastroesophageal reflux disease. 4. History of colon cancer status post colectomy. 5. Appendectomy. 6. Cholecystectomy. 7. Hysterectomy. 8. Cataract removal of the left eye. 9. Right hand surgery for fracture. 10.Partial colectomy.  MEDICATIONS: 1. Metoprolol 50 mg daily. 2. Nexium 40 mg twice daily. 3. Hydrocodone/Tylenol 7.5/325 one tablet every 4 hours as needed. 4. Lisinopril 10 mg daily. 5. Vitamin D2 50,000 units  every 7 days. 6. Leflunomide 20 mg daily. 7. Fish oil over-the-counter 1 tablet daily.  ALLERGIES:  The patient describes reaction to NITROFURANTOIN.  FAMILY HISTORY:  Coronary artery disease, Parkinson's rheumatoid arthritis, vasculitis.  SOCIAL HISTORY:  The patient lives with her husband.  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  Patient is nonsmoker, nondrinker, and denies alcohol use.  REVIEW OF SYSTEMS:  A 11 systems review of systems was performed which was as stated in the HPI, otherwise negative.  PHYSICAL EXAMINATION:  VITAL SIGNS:  The patient is afebrile with blood pressure 108/59, heart rate of 67, respiratory rate 22, oxygen saturation 97% on room air. GENERAL:  This is an elderly Caucasian female who is awake, alert, and oriented x3 in no acute distress. HEENT:  Head normocephalic, atraumatic.  Pupils equal, round, and reactive light.  Extraocular muscles are intact.  Sclerae anicteric and noninjected.  Tympanic membranes are translucent and reflected good cone of light.  Mucous membranes are moist, nonerythematous oropharynx.  Neck is supple without lymphadenopathy. LUNGS:  Rales in the right lower base.  There is no egophony. CARDIAC:  Regular rate with normal S1 and S2 sound with no murmurs auscultated. ABDOMEN:  Soft, nontender, nondistended with appropriate bowel sounds. VASCULAR:  2+ dorsalis pedis radial pulses with warm extremities and  good capillary refill.  There is no edema present.  LABORATORY DATA: 1. A CMP was performed which was normal with the exception of a     glucose 163. 2. A CBC was drawn which was normal. 3. A EKG was drawn which showed sinus rhythm with a left anterior     fascicular block with no ST elevation and depressions.  IMPRESSION AND PLAN: 1. Anaphylaxis.  We will admit the patient to observation and provide     frequent vital signs.  We will continue patient's IV hydration at     75 mL  an hour with normal saline.  We will allow patient to eat. 2. Hypertension.  We will continue the patient's antihypertensives. 3. Arthritis.  The patient does take a Vicodin certainly at home, but     we will provide this as patient needs. 4. Deep vein thrombosis prophylaxis.  We will start the patient on     Lovenox.  CODE STATUS:  The patient is a full code.          ______________________________ Candelaria Celeste, DO     JS/MEDQ  D:  08/12/2010  T:  08/13/2010  Job:  [id number]  cc:   Corrie Mckusick, M.D.  Electronically Signed by Candelaria Celeste DO on 08/15/2010 01:19:44 PM

## 2010-08-20 ENCOUNTER — Encounter (INDEPENDENT_AMBULATORY_CARE_PROVIDER_SITE_OTHER): Payer: Self-pay

## 2010-09-17 NOTE — Discharge Summary (Signed)
NAMESHERECE, GAMBRILL             ACCOUNT NO.:  [id number]  MEDICAL RECORD NO.:  [id number]  LOCATION:  3743                         FACILITY:  [facility]  PHYSICIAN:  Ladell Pier, M.D.   DATE OF BIRTH:  03-Jan-1934  DATE OF ADMISSION:  08/12/2010 DATE OF DISCHARGE:  08/13/2010                              DISCHARGE SUMMARY   DISCHARGE DIAGNOSES: 1. Anaphylactic reaction while getting spinal injection, question of     secondary to Kenalog versus lidocaine.  She was getting a surgical     epidural injection. 2. Hypertension. 3. Arthritis. 4. Gastroesophageal reflux disease. 5. History of colon cancer, status post colectomy. 6. Status post appendectomy. 7. Status post cholecystectomy. 8. Status post hysterectomy. 9. Cataract removal of the left eye. 10.Right hand surgery for fracture. 11.Benign paroxysmal positional vertigo.  DISCHARGE MEDICATIONS: 1. Fish oil daily. 2. Hydrocodone 7.5/325 every 4 hours as needed for pain. 3. Leflunomide 20 mg daily. 4. Lisinopril 10 mg daily. 5. Metoprolol 50 mg daily. 6. Nexium 40 mg twice daily. 7. Vitamin D2 50,000 units every 7 days.  FOLLOWUP APPOINTMENTS:  The patient to follow up with Dr. Phillips Odor sometime this week.  PROCEDURES:  None.  CONSULTANTS:  None.  HISTORY OF PRESENT ILLNESS:  The patient is a 77 year old female who presents with a past medical history significant for hypertension, arthritis, colon cancer, status post colectomy.  [facility] and undergoing epidural injection in the cervical spine which contained Kenalog and lidocaine.  The patient underwent injection, and then approximately 5 minutes later she had an acute onset of itching, then she became unresponsive.  The patient was given 50 of Benadryl and one dose of epinephrine.  The patient did have hypotension, systolic blood pressure falls between 60 and 80.  The patient had 1-2 doses of epinephrine in the emergency department.   Blood pressure stabilized.  Please see admission note for remainder of history, past medical history, family history, social history, meds, allergies, review of systems per admission H and P.  VITAL SIGNS:  Temperature 97.3, pulse of 90, respirations 18, blood pressure 169/76, pulse ox 93% on room air. GENERAL:  The patient is lying in bed, well-nourished white female. HEENT:  Normocephalic, atraumatic.  Pupils reactive to light without erythema. CARDIOVASCULAR:  Regular rate and rhythm. LUNGS:  Clear bilaterally. ABDOMEN:  Positive bowel sounds. EXTREMITIES:  Without edema.  HOSPITAL COURSE: 1. Anaphylactic reaction while getting cervical epidural injection.     The patient was admitted to the hospital, given IV fluids.  She was     given epinephrine in the emergency room and was given Benadryl at     the [facility].  The patient feels fine and back to baseline     now.  She had one episode while inpatient of almost falling.  Per     husband, she fell on the bed.  She does have benign paroxysmal     positional vertigo and that also happens at home from time to time,     she has to get up slowly, but this is not a new event. 2. Hypertension.  She will resume her home medications and follow up  with her PCP in 2 days.  DISCHARGE LABS:  Sodium 138, potassium 3.7, chloride 103, CO2 of 21, glucose 163, BUN 15, creatinine 0.68.  WBC 6.8, hemoglobin of 40.4, MCV 87.7, platelets of 210.  Time spent with the patient and family and doing this discharge is approximately 35 minutes.     Ladell Pier, M.D.     N[place]/MEDQ  D:  08/13/2010  T:  08/14/2010  Job:  [id number]  cc:   Corrie Mckusick, M.D.  Electronically Signed by Ladell Pier M.D. on 09/17/2010 10:09:24 PM

## 2010-09-18 ENCOUNTER — Ambulatory Visit (INDEPENDENT_AMBULATORY_CARE_PROVIDER_SITE_OTHER): Payer: Medicare Other | Admitting: Internal Medicine

## 2010-09-30 ENCOUNTER — Encounter (INDEPENDENT_AMBULATORY_CARE_PROVIDER_SITE_OTHER): Payer: Self-pay | Admitting: Internal Medicine

## 2010-09-30 ENCOUNTER — Ambulatory Visit (INDEPENDENT_AMBULATORY_CARE_PROVIDER_SITE_OTHER): Payer: Medicare Other | Admitting: Internal Medicine

## 2010-09-30 VITALS — BP 140/80 | HR 74 | Temp 98.2°F | Resp 12 | Ht 62.0 in | Wt 152.0 lb

## 2010-09-30 DIAGNOSIS — K279 Peptic ulcer, site unspecified, unspecified as acute or chronic, without hemorrhage or perforation: Secondary | ICD-10-CM

## 2010-09-30 MED ORDER — LANSOPRAZOLE 30 MG PO CPDR: 30.0000 mg | DELAYED_RELEASE_CAPSULE | Freq: Every day | ORAL | Status: DC

## 2010-09-30 NOTE — Progress Notes (Signed)
Presenting complaint; epigastric pain. Subjective; [name] is 77 year old Caucasian female patient of Dr. Lamar Blinks was ever scheduled visit. About 2 months ago she presented to Dr. Phillips Odor with epigastric pain agree with meals. She was noted to be tender in the epigastric region. She was suspected of peptic ulcer disease and begun on a PPI double dose and now she is 95% better. She is wondering what is the next step. In July last year she was seen in our office by Ms. Dorene Ar NP and responded to AcipHex. Her H. pylori serology was negative. Presently she is feeling fine. She has a good appetite. She states she has lost 40 pounds over a period of 7 years and this year she believes she has lost 10 pounds but now has leveled off. She has 3-4 bowel movements per day which has been her pattern since her colonic surgery she had in October 2005. She denies melena or rectal bleeding. She also denies heartburn nausea or vomiting. She does not take OTC NSAIDs. Current medications; Current Outpatient Prescriptions on File Prior to Visit  Medication Sig Dispense Refill  . Calcium Carbonate-Vitamin D (CALCIUM-VITAMIN D) 500-200 MG-UNIT per tablet Take 1 tablet by mouth.        . esomeprazole (NEXIUM) 40 MG capsule Take 40 mg by mouth. Patient takes 1 in the morning and 1 at night      . fish oil-omega-3 fatty acids 1000 MG capsule Take 2 g by mouth daily.        Marland Kitchen HYDROcodone-acetaminophen (NORCO) 10-325 MG per tablet Take by mouth Nightly.       . leflunomide (ARAVA) 10 MG tablet Take 10 mg by mouth daily.        . lansoprazole (PREVACID) 30 MG capsule Take 1 capsule (30 mg total) by mouth daily.  30 capsule  11   past medical history; History of colon carcinoma status post right hemicolectomy in October 2005. T3, N0, M0 lesion he did not receive any chemotherapy and has remained in remission She had colonoscopy in May 2007 revealing sigmoid diverticulosis and wide-open anastomosis. Another colonoscopy in  March 2011. A few diverticula at sigmoid colon otherwise unremarkable. She has hyperlipidemia Bronchiectasis involving right lung. Rheumatoid arthritis diagnosed in 2800 care of Dr. Jimmy Footman and doing well. H. pylori serology in 2011 was negative Other surgeries include hysterectomy cholecystectomy and surgery on right wrist for fracture as well as a right shoulder for fracture Allergies NKA. Family history is negative for colorectal carcinoma. Objective; BP 140/80  Pulse 74  Temp(Src) 98.2 F (36.8 C) (Oral)  Resp 12  Ht 5\' 2"  (1.575 m)  Wt 152 lb (68.947 kg)  BMI 27.80 kg/m2 Conjunctiva is pink sclerae nonicteric. Oropharyngeal mucosa is normal. No neck masses or thyromegaly noted. Cardiac exam with regular rhythm normal S1 and S2. No murmur gallop noted. Lungs clear to auscultation the Abdomen is symmetrical. Bowel sounds are normal. No bruits noted. On palpation soft abdomen without tenderness organomegaly or masses. Rectal examination reveals soft stool in the vault and it is quite negative. No peripheral edema or clubbing noted. Lab data from Dr. Lamar Blinks office on 07/28/2010 WBC 7.8, hemoglobin 13.7, hematocrit 42.3, platelets 246k Comprehensive chemistry panel normal. Calcium 9.5. Serum albumin 4.4 AST 17 ALT 15 Assessment; #1 Epigastric pain has responded to double dose PPI; her abdominal exam is unremarkable and her stool is guaiac-negative. Her clinical course his daffy suggestive of peptic ulcer disease. Her H. pylori serology was negative last year and she does  not take any NSAIDs. Since she is feeling so much better am afraid EGD would be negative; however if her symptoms relapse consider EGD. #2. History of colon carcinoma last colonoscopy was in March 2011; next one due in March 2014. She is having frequent stools secondary to surgery. Plan; Would switch her to lansoprazole at 30 mg daily before his her co-pay is high for Nexium. She can take Imodium OTC 1 daily to  reduce frequency of her bowel movements. If her epigastric pain relapses will proceed with EGD otherwise she will return for office visit in 3 months.

## 2010-09-30 NOTE — Patient Instructions (Signed)
Stop nexium. Start Lansoprazole 30 mg by mouth 30 minutes daily before breakfast. Call if pain returns.

## 2010-10-03 LAB — CBC
MCV: 87.4
RBC: 4.19
WBC: 7.5

## 2010-10-03 LAB — CEA: CEA: <0.5

## 2010-10-09 LAB — CEA: CEA: 1.4

## 2010-10-17 LAB — CEA: CEA: 1.1 ng/mL (ref 0.0–5.0)

## 2010-10-21 LAB — CEA: CEA: 0.5

## 2010-10-27 LAB — FUNGUS CULTURE W SMEAR

## 2010-10-27 LAB — LEGIONELLA PROFILE(CULTURE+DFA/SMEAR)

## 2010-10-27 LAB — P CARINII SMEAR DFA

## 2010-10-28 ENCOUNTER — Encounter (HOSPITAL_COMMUNITY): Payer: Medicare Other | Attending: Oncology

## 2010-10-28 DIAGNOSIS — Z85038 Personal history of other malignant neoplasm of large intestine: Secondary | ICD-10-CM | POA: Insufficient documentation

## 2010-10-28 DIAGNOSIS — C189 Malignant neoplasm of colon, unspecified: Secondary | ICD-10-CM

## 2010-10-28 NOTE — Progress Notes (Signed)
Labs drawn today for cea 

## 2010-12-08 HISTORY — PX: BACK SURGERY: SHX140

## 2010-12-24 ENCOUNTER — Other Ambulatory Visit (HOSPITAL_COMMUNITY): Payer: Self-pay | Admitting: Family Medicine

## 2010-12-24 DIAGNOSIS — Z139 Encounter for screening, unspecified: Secondary | ICD-10-CM

## 2010-12-30 ENCOUNTER — Ambulatory Visit (HOSPITAL_COMMUNITY)
Admission: RE | Admit: 2010-12-30 | Discharge: 2010-12-30 | Disposition: A | Discharge status: Home / Self Care | Payer: Medicare Other | Source: Ambulatory Visit | Attending: Family Medicine | Admitting: Family Medicine

## 2010-12-30 DIAGNOSIS — Z139 Encounter for screening, unspecified: Secondary | ICD-10-CM

## 2010-12-30 DIAGNOSIS — Z1231 Encounter for screening mammogram for malignant neoplasm of breast: Secondary | ICD-10-CM | POA: Insufficient documentation

## 2011-01-20 ENCOUNTER — Encounter (HOSPITAL_COMMUNITY): Payer: Medicare Other | Attending: Oncology

## 2011-01-20 DIAGNOSIS — Z85038 Personal history of other malignant neoplasm of large intestine: Secondary | ICD-10-CM | POA: Insufficient documentation

## 2011-01-20 DIAGNOSIS — C189 Malignant neoplasm of colon, unspecified: Secondary | ICD-10-CM

## 2011-01-20 NOTE — Progress Notes (Signed)
Labs drawn today for cea 

## 2011-02-25 ENCOUNTER — Ambulatory Visit (HOSPITAL_COMMUNITY): Payer: Medicare Other | Admitting: Oncology

## 2011-02-25 ENCOUNTER — Encounter (HOSPITAL_COMMUNITY): Payer: Self-pay | Admitting: Oncology

## 2011-02-25 ENCOUNTER — Encounter (HOSPITAL_COMMUNITY): Payer: Medicare Other | Attending: Oncology | Admitting: Oncology

## 2011-02-25 DIAGNOSIS — C189 Malignant neoplasm of colon, unspecified: Secondary | ICD-10-CM

## 2011-02-25 DIAGNOSIS — R197 Diarrhea, unspecified: Secondary | ICD-10-CM

## 2011-02-25 DIAGNOSIS — R634 Abnormal weight loss: Secondary | ICD-10-CM | POA: Insufficient documentation

## 2011-02-25 LAB — DIFFERENTIAL
Basophils Relative: 1 % (ref 0–1)
Eosinophils Absolute: 0.3 10*3/uL (ref 0.0–0.7)
Monocytes Relative: 15 % — ABNORMAL HIGH (ref 3–12)
Neutrophils Relative %: 44 % (ref 43–77)

## 2011-02-25 LAB — CBC
Hemoglobin: 13.8 g/dL (ref 12.0–15.0)
MCH: 29.7 pg (ref 26.0–34.0)
MCHC: 33.5 g/dL (ref 30.0–36.0)
Platelets: 219 10*3/uL (ref 150–400)
RDW: 14.1 % (ref 11.5–15.5)

## 2011-02-25 LAB — SEDIMENTATION RATE: Sed Rate: 8 mm/hr (ref 0–22)

## 2011-02-25 LAB — COMPREHENSIVE METABOLIC PANEL
Albumin: 3.9 g/dL (ref 3.5–5.2)
Alkaline Phosphatase: 69 U/L (ref 39–117)
BUN: 8 mg/dL (ref 6–23)
Potassium: 3.8 mEq/L (ref 3.5–5.1)
Total Protein: 7.2 g/dL (ref 6.0–8.3)

## 2011-02-25 NOTE — Patient Instructions (Signed)
CORABELLE SPACKMAN  [id number] 1933/10/13   [facility]  Discharge Instructions  RECOMMENDATIONS MADE BY THE CONSULTANT AND ANY TEST RESULTS WILL BE SENT TO YOUR REFERRING DOCTOR.   EXAM FINDINGS BY MD TODAY AND SIGNS AND SYMPTOMS TO REPORT TO CLINIC OR PRIMARY MD: want to do some labs today and also make a referral for you to see Dr. Karilyn Cota for your diarrhea and weight loss.  If you start coughing up yellow or greenish mucus let us know.  MEDICATIONS PRESCRIBED: none   INSTRUCTIONS GIVEN AND DISCUSSED:   SPECIAL INSTRUCTIONS/FOLLOW-UP: Return to Clinic in 1 month.   I acknowledge that I have been informed and understand all the instructions given to me and received a copy. I do not have any more questions at this time, but understand that I may call the Specialty Clinic at [facility] at [phone] during business hours should I have any further questions or need assistance in obtaining follow-up care.    __________________________________________  _____________  __________ Signature of Patient or Authorized Representative            Date                   Time    __________________________________________ Nurse's Signature

## 2011-02-25 NOTE — Progress Notes (Signed)
This office note has been dictated.

## 2011-02-25 NOTE — Progress Notes (Signed)
[name] presented for labwork. Labs per MD order drawn via Peripheral Line 23 gauge needle inserted in left AC  Good blood return present. Procedure without incident.  Needle removed intact. Patient tolerated procedure well.

## 2011-02-25 NOTE — Progress Notes (Signed)
CC:   Cassandra Rear. Sherwood Gambler, MD [name], M.D. Cassandra Deeds, MD [name], M.D.  DIAGNOSES: 1. Stage II adenocarcinoma of the colon (T3 N0 M0) right-sided     intermediate grade with a right hemicolectomy on 10/17/2003 for a     5.0 cm cancer of the cecum, 13 negative nodes and we did not     recommend adjuvant chemotherapy at that time, though we did offer     her a protocol and she declined.  She had no LVI.  CEAs have     remained within the normal range since and she had a colonoscopy     February 2011.  She is due for one in February 2014. 2. Rheumatoid arthritis for which she has seen Dr. Syliva [place] in     the past, now seeing Dr. Dierdre [place]. 3. History of bronchiectasis for which she saw Dr. Delford [place] many years     ago. 4. Weight loss of approximately 28 pounds in 2 years now with a     decreasing appetite. 5. Hypertension on the lisinopril presently. 6. Gastroesophageal reflux disease symptomatology on Prevacid.  [name] is here today, just not feeling well, does not look as well to me either.  She looks more chronically ill these days.  She does not have a great appetite.  She has a little bit of an appetite.  When she eats anything, it stimulates her bowels to move and she is going 10-12 times a day.  Several months ago, she was going as much as 15-20 times a day.  It is usually small amounts, and she states it has been somewhat like this since her surgery.  She just does not know why she is not holding on to her weight.  She has not had any fevers or night sweats. Her last CEA level that we checked was still well within the normal range.  That was on 01/20/2011.  She is up-to-date on her mammography, which was done in [place].  She still sees Dr. Emelda [place], her gynecologist.  She is not really on much right now for her rheumatoid arthritis realistically at all except some acetaminophen.  She is not aware of any lumps anywhere.  She says she coughs, brings  up only clear phlegm.  She did have neck surgery by Dr. Trey [place] about 2 months ago.  That went well.  She states she has much less neck pain, but still has some right arm discomfort and shoulder discomfort on the right, which is not much better, but she was told that might not improve, but the neck pain is significantly better she states and her incision is well-healed on the left neck.  PHYSICAL EXAMINATION:  She is afebrile.  She denies any pain.  Blood pressure 175/83 left arm sitting position, pulse 80 and regular, respirations 16 and unlabored.  Her weight is 140 pounds, 5 feet 2 inches tall.  BMI 25.7.  She has no obvious lymphadenopathy in any location.  She has no obvious hepatosplenomegaly.  She has no abdominal distention.  Bowel sounds are normal.  Her heart shows a regular rhythm and rate without obvious murmur, rub, or gallop, but she does have rales at both lung bases.  She has some small expiratory wheezes on both upper and lower lobes bilaterally.  She has some left anterior wheezing and some rhonchi, and she has a few rhonchi bilaterally in both lungs.  She is 78 years old, but she thought her appetite should be  decreasing anyway with age, but I do not think that is the case.  So I am going to do some blood work, and also get her back to Dr. Karilyn [place] to see about anything else to be done about this diarrhea, frequency of stools.  I want to see her back in a month.  If her lungs are not better, I think we ought to repeat CT of the chest and probably do the abdomen as well to make sure nothing else is going on.  I do not really like this weight loss and the way that she looks today.    ______________________________ Cassandra Horns. Mariel Sleet, MD ESN/MEDQ  D:  02/25/2011  T:  02/25/2011  Job:  [id number]

## 2011-02-26 LAB — CEA: CEA: 1 ng/mL (ref 0.0–5.0)

## 2011-02-27 ENCOUNTER — Telehealth (HOSPITAL_COMMUNITY): Payer: Self-pay | Admitting: *Deleted

## 2011-02-27 NOTE — Telephone Encounter (Signed)
Message copied by Dennie Maizes on Fri Feb 27, 2011  9:46 AM ------      Message from: Mariel Sleet, ERIC S      Created: Fri Feb 27, 2011  9:25 AM       Labs great.

## 2011-02-27 NOTE — Telephone Encounter (Signed)
Spoke with pt as below. Pleased with results.

## 2011-03-17 ENCOUNTER — Encounter (INDEPENDENT_AMBULATORY_CARE_PROVIDER_SITE_OTHER): Payer: Self-pay | Admitting: Internal Medicine

## 2011-03-17 ENCOUNTER — Ambulatory Visit (INDEPENDENT_AMBULATORY_CARE_PROVIDER_SITE_OTHER): Payer: Medicare Other | Admitting: Internal Medicine

## 2011-03-17 VITALS — BP 140/90 | HR 80 | Temp 97.9°F | Resp 12 | Ht 61.0 in | Wt 139.9 lb

## 2011-03-17 DIAGNOSIS — R634 Abnormal weight loss: Secondary | ICD-10-CM

## 2011-03-17 DIAGNOSIS — Z85038 Personal history of other malignant neoplasm of large intestine: Secondary | ICD-10-CM | POA: Insufficient documentation

## 2011-03-17 DIAGNOSIS — R197 Diarrhea, unspecified: Secondary | ICD-10-CM

## 2011-03-17 MED ORDER — LOPERAMIDE HCL 2 MG PO TABS: 2.0000 mg | ORAL_TABLET | Freq: Every day | ORAL | Status: AC

## 2011-03-17 NOTE — Patient Instructions (Signed)
Continue probiotic one capsule daily. Imodium (OTC) 2 mg daily by mouth every morning. Keep stool diary until her office visit

## 2011-03-18 NOTE — Progress Notes (Signed)
Presenting complaint; Diarrhea and weight loss. Subjective: [name] is an 78 year old Caucasian female who presents for evaluation of diarrhea. Patient states that she's had 3-4 bowel movements ever since she had right, colectomy in October 2005. However last fall frequency of her bowel movements increased to 10-12 per day. She had liquid stools. She was seen by Dr. Sherwood Gambler. One of her stool studies was abnormal and she was given antibiotic. In the meantime she is also started take probiotic and feels much better. She has kept stool diary for the last 3 weeks and she said anywhere from one to 4 stools per day. Her appetite is fair. She denies nausea vomiting heartburn or epigastric pain. When she was having 10-12 stools per day she had few accidents. She is still wearing a diaper just to be on the safe side. He has abdominal pain or cramps. Her stool and foul smelling or bulky. She also denies excessive flatus. Since her last visit of 09/30/2011 for epigastric pain she has lost another 13 pounds. All in all she has lost close to 50 pounds since her colon surgery. She denies fever chills or night sweats. She has been having problems with neck pain. She developed reaction to epidural injection and ended up in the hospital. After failed neck traction she went on to have neck surgery on 12/08/2010 and finally she is feeling better. Current Medications: Current Outpatient Prescriptions on File Prior to Visit  Medication Sig Dispense Refill  . Calcium Carbonate-Vitamin D (CALCIUM-VITAMIN D) 500-200 MG-UNIT per tablet Take 1 tablet by mouth.        . ergocalciferol (VITAMIN D2) 50000 UNITS capsule Take 50,000 Units by mouth once a week.        . lansoprazole (PREVACID) 30 MG capsule Take 1 capsule (30 mg total) by mouth daily.  30 capsule  11  . leflunomide (ARAVA) 10 MG tablet Take 20 mg by mouth daily.       Marland Kitchen lisinopril (PRINIVIL,ZESTRIL) 10 MG tablet Take 10 mg by mouth daily.         past medical  history; History of colon carcinoma. Status post right, colectomy in October 2005. He not receive adjuvant therapy and has remained in remission. [place] colonoscopy was in March 2011. Epigastric pain responding to PPI. Pylori serology has been negative. Rheumatoid arthritis was diagnosed in 200. Bronchiectasis involving the right lung. Hyperlipidemia. Next surgery in November 2012. Cholecystectomy. Hysterectomy. Right wrist surgery for fracture. Surgery on shoulder for fracture     Objective: Blood pressure 140/90, pulse 80, temperature 97.9 F (36.6 C), temperature source Oral, resp. rate 12, height 5\' 1"  (1.549 m), weight 139 lb 14.4 oz (63.458 kg).  Conjunctiva is pink. Sclera is nonicteric Oropharyngeal mucosa is normal. No neck masses or thyromegaly noted. Cardiac exam with regular rhythm normal S1 and S2. No murmur or gallop noted. Lungs are clear to auscultation. Abdomen is symmetrical. Bowel sounds are normal. No bruits noted. On palpation soft abdomen without tenderness organomegaly or masses. Rectal examination reveals guaiac-negative stool. LE edema or clubbing noted.  Labs/studies Results: From 02/25/2011. WBC 5.3, hemoglobin 13.8, hematocrit 41.2, platelets 219K. After lites normal. BUN 8, creatinine 0.53, calcium 9.9, bilirubin oh 0.3, AB 69,  AST 32, ALT 24, total protein 7.2 and albumin of 3.9.   Assessment: Patient is a 78 year old Caucasian female who has noted increased frequency of defecation since right, colectomy in October 2006 who recently noted big change in her bowel movements with as many as 10-12 stools per day.  She has improved with antibiotic therapy that was given by Dr. Sherwood Gambler for abnormal stool study. She is now having 1-4 stools per day. Her gradual weight loss is of concern. She has lost 13 pounds since her office visit on 09/30/2010. it is possible some of her weight loss may be secondary to neck problems and drop in appetite  She could have small  bowel bacterial overgrowth which would explain her weight loss and response to recent course of antibiotic. However she does not have very foul-smelling stool or bulky stool. It is reassuring to know that she had normal CBC and comprehensive chemistry panel 3 weeks ago. Since she is feeling so much better I would like to monitor her over the next 2 weeks and determine need for further workup.   Plan: Request results of stool studies from Dr. Sharyon Medicus office. Continue probiotic one capsule daily.  Imodium OTC 2 mg by mouth every morning. Stool diary as to consistency and frequency of bowel movements. Office visit in 4 weeks.

## 2011-03-24 ENCOUNTER — Encounter (HOSPITAL_COMMUNITY): Payer: Self-pay | Admitting: Oncology

## 2011-03-24 ENCOUNTER — Encounter (HOSPITAL_COMMUNITY): Payer: Medicare Other | Attending: Oncology | Admitting: Oncology

## 2011-03-24 VITALS — BP 131/73 | HR 73 | Temp 97.8°F | Ht 62.0 in | Wt 139.2 lb

## 2011-03-24 DIAGNOSIS — J479 Bronchiectasis, uncomplicated: Secondary | ICD-10-CM

## 2011-03-24 DIAGNOSIS — Z85038 Personal history of other malignant neoplasm of large intestine: Secondary | ICD-10-CM | POA: Insufficient documentation

## 2011-03-24 NOTE — Progress Notes (Signed)
CC:   [name]. Cassandra Gambler, MD Lionel December, M.D. Alben Deeds, MD Dalia Heading, M.D.  DIAGNOSES: 1. Stage II adenocarcinoma of the colon (T3 N0 right-sided     intermediate grade) with a right hemicolectomy on 10/17/2003 for a     5.0-cm cancer of the cecum with 13 negative nodes, and she was     offered inclusion in a protocol at that time but declined.  She had     no LVI. CEAs remained normal.  Colonoscopies have been     unremarkable, and she is due for another 1 in February 2014. 2. Bronchiectasis since 2008. 3. Rheumatoid arthritis. 4. Hyperlipidemia. 5. Appendectomy at the time of the hysterectomy for precancer cells of     the cervix. 6. Hysterectomy. 7. Cholecystectomy. 8. Right rotator cuff damage at the time of trauma at age 24, and also     requiring right hand surgery x3 with pin placements. [name] saw Dr. Karilyn Cota who helped her to get much better control of her bowels.  He has put her on a probiotic capsule once a day, Imodium over the counter 2 mg by mouth every morning, and to keep a stool diarrhea. She is already feeling a whole lot better.  Her weight is stabilized. She is not losing weight.  Her appetite seems to be little better she states.  From the standpoint of her lungs, which I brought her back today to listen to, she restarted her Mucinex once a day, and she still has rales at the right lung base in particular. Just a few at the left lung base, very minor. Most of the rales clear with coughing, but the ones on the right persists.  So I think this goes along with him bronchiectasis. Since starting the Mucinex once again, she has also started bringing up the last few days, some greenish phlegm, but she is breathing better she states.  So what I have told her to do, if the green phlegm persists for 2 more weeks, then I think I would like her to see Dr. Delford Field to see if she needs any further therapy.  I am going to ask her to increase her Mucinex to  twice a day to see if that will help, and hopefully she will push fluids.  Her sense of well-being is definitely better she states.  So we will see her back in February of next year sooner if need be, but she is out over 7 years since her surgery.  She just needs routine follow up from her colon standpoint with Dr. Karilyn Cota and just to see [place] once a year.    ______________________________ Ladona Horns. Mariel Sleet, MD ESN/MEDQ  D:  03/24/2011  T:  03/24/2011  Job:  [id number]

## 2011-03-24 NOTE — Progress Notes (Signed)
This office note has been dictated.

## 2011-03-24 NOTE — Patient Instructions (Signed)
MECKENZIE BALSLEY  [id number] [birth year]  [facility]  Discharge Instructions  RECOMMENDATIONS MADE BY THE CONSULTANT AND ANY TEST RESULTS WILL BE SENT TO YOUR REFERRING DOCTOR.   EXAM FINDINGS BY MD TODAY AND SIGNS AND SYMPTOMS TO REPORT TO CLINIC OR PRIMARY MD: MD wants you to increase your mucinex to two times daily.  If no improvement he wants you to let us know and we will get you scheduled to see Dr. Delford Field.  MEDICATIONS PRESCRIBED: increase mucinex to twice daily   INSTRUCTIONS GIVEN AND DISCUSSED: Other :  Report increased shortness of breath, changes in bowel habits, blood in stool, etc.  SPECIAL INSTRUCTIONS/FOLLOW-UP: Lab work Needed in 1 year  and Return to Clinic in 1 year after lab work.   I acknowledge that I have been informed and understand all the instructions given to me and received a copy. I do not have any more questions at this time, but understand that I may call the Specialty Clinic at [facility] at [phone] during business hours should I have any further questions or need assistance in obtaining follow-up care.    __________________________________________  _____________  __________ Signature of Patient or Authorized Representative            Date                   Time    __________________________________________ Nurse's Signature

## 2011-04-14 ENCOUNTER — Encounter (INDEPENDENT_AMBULATORY_CARE_PROVIDER_SITE_OTHER): Payer: Self-pay | Admitting: Internal Medicine

## 2011-04-14 ENCOUNTER — Ambulatory Visit (INDEPENDENT_AMBULATORY_CARE_PROVIDER_SITE_OTHER): Payer: Medicare Other | Admitting: Internal Medicine

## 2011-04-14 VITALS — BP 140/90 | HR 70 | Temp 98.5°F | Resp 18 | Ht 62.0 in | Wt 139.4 lb

## 2011-04-14 DIAGNOSIS — R197 Diarrhea, unspecified: Secondary | ICD-10-CM

## 2011-04-14 DIAGNOSIS — R1013 Epigastric pain: Secondary | ICD-10-CM | POA: Insufficient documentation

## 2011-04-14 NOTE — Patient Instructions (Addendum)
Continue Imodium 1-2 mg daily by mouth. Can substitute activia for probiotic or use align one capsule daily.

## 2011-04-14 NOTE — Progress Notes (Signed)
Presenting complaint;  Followup for chronic diarrhea.  Subjective:  [name] is 78 year old Caucasian female who is here for followup regarding her diarrhea. She was last seen 4 weeks ago. She had negative stool studies by Dr. Sherwood Gambler. Patient was advised to continue probiotic and asked to take Imodium 2 mg every morning. As recommended she has kept stool diary for 26 days. Only on one day she had 3 bowel movements and rest of the days she had one or two stools per day. She has not had any accidents or urgency. She has stopped using depends. Most of her stools are small caliber and semi-formed. Her weight is stable and her appetite is normal. She is interested in an alternative to probiotic as it costs her $34 a month. She has been taking Mucinex and her cough has decreased and she has easy time expectorating and sputum is getting clear.   Current Medications: Current Outpatient Prescriptions  Medication Sig Dispense Refill  . Calcium Carbonate-Vitamin D (CALCIUM-VITAMIN D) 500-200 MG-UNIT per tablet Take 1 tablet by mouth.        . ergocalciferol (VITAMIN D2) 50000 UNITS capsule Take 50,000 Units by mouth once a week.        . GuaiFENesin (MUCINEX PO) Take by mouth 2 (two) times daily.       Marland Kitchen HYDROcodone-acetaminophen (VICODIN) 5-500 MG per tablet Take 1 tablet by mouth. Patient states that she takes 1 - 1 1/2 at night      . Krill Oil 500 MG CAPS Take 500 mg by mouth daily.      . lansoprazole (PREVACID) 30 MG capsule Take 1 capsule (30 mg total) by mouth daily.  30 capsule  11  . leflunomide (ARAVA) 10 MG tablet Take 20 mg by mouth daily.       Marland Kitchen lisinopril (PRINIVIL,ZESTRIL) 10 MG tablet Take 10 mg by mouth daily.        Marland Kitchen loperamide (IMODIUM A-D) 2 MG tablet Take 2 mg by mouth daily.      . Probiotic Product (PROBIOTIC FORMULA) CAPS Take 1 capsule by mouth daily.         Objective: Blood pressure 140/90, pulse 70, temperature 98.5 F (36.9 C), temperature source Oral, resp. rate 18,  height 5\' 2"  (1.575 m), weight 139 lb 6.4 oz (63.231 kg). Patient is in no distress. Conjunctiva is pink. Sclera is nonicteric Oropharyngeal mucosa is normal. No neck masses or thyromegaly noted. Cardiac exam with regular rhythm normal S1 and S2. No murmur or gallop noted. She has few rhonchi at left base. Abdomen is flat. Bowel sounds are normal. Abdomen is soft and nontender without organomegaly or masses the  No LE edema or clubbing noted.    Assessment:  #1. Chronic diarrhea possibly related to prior right, colectomy she had in October 2005 for colon carcinoma. She has responded to simple measures; therefore would not pursue with further workup. Her last colonoscopy was in March 2011. Her diarrhea is not typical of small bowel bacterial overgrowth or do to chloretic  effect of bile on colon. If she begins to lose weight Will need further workup.   Plan:  Continue Imodium OTC 2 mg by mouth every morning. She was given samples of Align 1 capsule by mouth daily. When she runs out of samples she will try Activia one cup daily. Unless she has problems she will return for office visit in one year. Surveillance colonoscopy in one year

## 2011-11-23 ENCOUNTER — Other Ambulatory Visit (HOSPITAL_COMMUNITY): Payer: Self-pay | Admitting: Family Medicine

## 2011-11-23 DIAGNOSIS — Z139 Encounter for screening, unspecified: Secondary | ICD-10-CM

## 2012-01-01 ENCOUNTER — Ambulatory Visit (HOSPITAL_COMMUNITY)
Admission: RE | Admit: 2012-01-01 | Discharge: 2012-01-01 | Disposition: A | Discharge status: Home / Self Care | Payer: Medicare Other | Source: Ambulatory Visit | Attending: Family Medicine | Admitting: Family Medicine

## 2012-01-01 DIAGNOSIS — Z139 Encounter for screening, unspecified: Secondary | ICD-10-CM

## 2012-01-01 DIAGNOSIS — Z1231 Encounter for screening mammogram for malignant neoplasm of breast: Secondary | ICD-10-CM | POA: Insufficient documentation

## 2012-03-11 ENCOUNTER — Encounter (INDEPENDENT_AMBULATORY_CARE_PROVIDER_SITE_OTHER): Payer: Self-pay | Admitting: *Deleted

## 2012-03-21 ENCOUNTER — Encounter (HOSPITAL_COMMUNITY): Payer: Medicare Other | Attending: Oncology

## 2012-03-21 ENCOUNTER — Other Ambulatory Visit (HOSPITAL_COMMUNITY): Payer: Self-pay | Admitting: Oncology

## 2012-03-21 DIAGNOSIS — Z85038 Personal history of other malignant neoplasm of large intestine: Secondary | ICD-10-CM | POA: Insufficient documentation

## 2012-03-21 DIAGNOSIS — Z09 Encounter for follow-up examination after completed treatment for conditions other than malignant neoplasm: Secondary | ICD-10-CM | POA: Insufficient documentation

## 2012-03-21 DIAGNOSIS — E876 Hypokalemia: Secondary | ICD-10-CM

## 2012-03-21 LAB — COMPREHENSIVE METABOLIC PANEL
BUN: 11 mg/dL (ref 6–23)
CO2: 30 mEq/L (ref 19–32)
Calcium: 9.7 mg/dL (ref 8.4–10.5)
Creatinine, Ser: 0.72 mg/dL (ref 0.50–1.10)
GFR calc Af Amer: >90 mL/min (ref >90)
GFR calc non Af Amer: 81 mL/min — ABNORMAL LOW (ref >90)
Glucose, Bld: 100 mg/dL — ABNORMAL HIGH (ref 70–99)

## 2012-03-21 LAB — CBC
HCT: 39.7 % (ref 36.0–46.0)
Hemoglobin: 13.3 g/dL (ref 12.0–15.0)
MCH: 29.8 pg (ref 26.0–34.0)
MCV: 88.8 fL (ref 78.0–100.0)
RBC: 4.47 MIL/uL (ref 3.87–5.11)

## 2012-03-21 MED ORDER — POTASSIUM CHLORIDE CRYS ER 15 MEQ PO TBCR: 15.0000 meq | EXTENDED_RELEASE_TABLET | Freq: Every day | ORAL | Status: DC

## 2012-03-21 NOTE — Progress Notes (Signed)
Labs drawn today for cbc,cmp,cea

## 2012-03-23 ENCOUNTER — Encounter (HOSPITAL_COMMUNITY): Payer: Self-pay | Admitting: Oncology

## 2012-03-23 ENCOUNTER — Encounter (HOSPITAL_BASED_OUTPATIENT_CLINIC_OR_DEPARTMENT_OTHER): Payer: Medicare Other | Admitting: Oncology

## 2012-03-23 VITALS — BP 131/78 | HR 84 | Temp 97.9°F | Resp 16 | Wt 137.4 lb

## 2012-03-23 DIAGNOSIS — L293 Anogenital pruritus, unspecified: Secondary | ICD-10-CM

## 2012-03-23 DIAGNOSIS — Z85038 Personal history of other malignant neoplasm of large intestine: Secondary | ICD-10-CM

## 2012-03-23 DIAGNOSIS — L290 Pruritus ani: Secondary | ICD-10-CM

## 2012-03-23 NOTE — Patient Instructions (Addendum)
[facility] Discharge Instructions  RECOMMENDATIONS MADE BY THE CONSULTANT AND ANY TEST RESULTS WILL BE SENT TO YOUR REFERRING PHYSICIAN.  EXAM FINDINGS BY THE PHYSICIAN TODAY AND SIGNS OR SYMPTOMS TO REPORT TO CLINIC OR PRIMARY PHYSICIAN: Exam and discussion by MD.  The area on your perineal area is not a yeast infection and you need to see Dr. Emelda Fear again.  Avoid rubbing or scratching the area.  Don't use soaps with fragrances when washing that area.  After urination rinse area and pat dry. Your CEA is great.  No evidence of recurrence by exam.  MEDICATIONS PRESCRIBED:  none  INSTRUCTIONS GIVEN AND DISCUSSED: Report changes in your bowel habits, blood in your stool etc.  SPECIAL INSTRUCTIONS/FOLLOW-UP: Return for blood work in 1 year and then for follow-up.  Thank you for choosing [facility] to provide your oncology and hematology care.  To afford each patient quality time with our providers, please arrive at least 15 minutes before your scheduled appointment time.  With your help, our goal is to use those 15 minutes to complete the necessary work-up to ensure our physicians have the information they need to help with your evaluation and healthcare recommendations.    Effective January 1st, 2014, we ask that you re-schedule your appointment with our physicians should you arrive 10 or more minutes late for your appointment.  We strive to give you quality time with our providers, and arriving late affects you and other patients whose appointments are after yours.    Again, thank you for choosing [facility].  Our hope is that these requests will decrease the amount of time that you wait before being seen by our physicians.       _____________________________________________________________  Should you have questions after your visit to [facility], please contact our office at [phone] between the hours of 8:30 a.m. and  5:00 p.m.  Voicemails left after 4:30 p.m. will not be returned until the following business day.  For prescription refill requests, have your pharmacy contact our office with your prescription refill request.

## 2012-03-23 NOTE — Progress Notes (Signed)
Problem #1 stage II adenocarcinoma of colon (T3, N0) right-sided, grade 2, status post right hemicolectomy on 10/17/2003 for a 5.0 cm cancer the cecum with 13 negative nodes. She was offered inclusion in a protocol at that time but declined. She did not have LV I. Her CEAs have remained normal and she has remained disease-free since. Colonoscopies have remained unremarkable. She is due for EGD and colonoscopy very soon. #2 bronchiectasis since 2008 #3 rheumatoid arthritis #4 hyperlipidemia #5 appendectomy at the time of a hysterectomy for a pre-malignant process of the cervix. #6 hysterectomy in the past #7 cholecystectomy #8 right rotator cuff damage at the time of trauma at age 34 required right hand surgery x3 as well #9 itching and burning of the peri- labial areas  Her review of systems is positive for itching and burning around her genital areas and perianal areas. She has been treated with an antibiotic for UTI per Dr. Emelda Fear. She is also been treated on 2 occasions for candidiasis of the vaginal area without any improvement in her symptoms. Therefore all 3 interventions since January have been futile. She still itches and burns daily. She cannot hardly keep herself from scratching.  Vital signs are stable. Bowel function is good. She is not aware of any discharge I should also add at the present time.  She has no lymphadenopathy in any location. Her lungs are clear except for rales at the bases predominantly laterally. Heart shows a regular rhythm and rate without murmur rub or gallop. Abdomen remains soft and nontender without hepatosplenomegaly. Bowel sounds are normal. She has no arm edema or leg edema.  Her genital areas have no discharge from the vagina but she has a whitish, atrophic, slightly irregularly bordered area that extends the entire length around the labia majora and the perianal area. She has one at least small excavated area 9 mm across to the right of the anus.  I've  discussed her case with Dr. Emelda Fear who is agreed to see and evaluate her again in the very near future.  I have informed her that this is not candidiasis but that needs further evaluation and may be a type of lichen sclerosis.

## 2012-03-28 ENCOUNTER — Encounter (INDEPENDENT_AMBULATORY_CARE_PROVIDER_SITE_OTHER): Payer: Self-pay | Admitting: Internal Medicine

## 2012-03-28 ENCOUNTER — Other Ambulatory Visit (INDEPENDENT_AMBULATORY_CARE_PROVIDER_SITE_OTHER): Payer: Self-pay | Admitting: *Deleted

## 2012-03-28 ENCOUNTER — Telehealth (INDEPENDENT_AMBULATORY_CARE_PROVIDER_SITE_OTHER): Payer: Self-pay | Admitting: *Deleted

## 2012-03-28 ENCOUNTER — Ambulatory Visit (INDEPENDENT_AMBULATORY_CARE_PROVIDER_SITE_OTHER): Payer: Medicare Other | Admitting: Internal Medicine

## 2012-03-28 VITALS — BP 146/68 | HR 72 | Temp 98.4°F | Ht 62.0 in | Wt 138.7 lb

## 2012-03-28 DIAGNOSIS — R1013 Epigastric pain: Secondary | ICD-10-CM

## 2012-03-28 DIAGNOSIS — G8929 Other chronic pain: Secondary | ICD-10-CM | POA: Insufficient documentation

## 2012-03-28 DIAGNOSIS — Z85038 Personal history of other malignant neoplasm of large intestine: Secondary | ICD-10-CM

## 2012-03-28 DIAGNOSIS — Z1211 Encounter for screening for malignant neoplasm of colon: Secondary | ICD-10-CM

## 2012-03-28 NOTE — Telephone Encounter (Signed)
Patient needs movi prep 

## 2012-03-28 NOTE — Patient Instructions (Addendum)
EGD/Colonoscopy

## 2012-03-28 NOTE — Progress Notes (Signed)
Subjective:     Patient ID: [name], female   DOB: [birth year], 79 y.o.   MRN: [id number]  HPIHere for f/u. Due for a surveillance colonoscopy.  She tells me today she is having epigastric pain. She has had pain for 2-3 yrs. Pain is worse.  She takes Tums during the night for the pain. She took Nexium 6-8 months ago and this helped her pain. She stopped but the pain came back.  She says she has no indigestion, just a gnawing feeling. Omeprazole and Prevacid helped but not as much as the Nexium. Appetite is not good. She tells me that foods due not taste right. Foods taste like motor oil.   She usually has BM twice a day. She is on Align and this has helped. She previously was having about 10 stools a day. Weight in 2011 166. Today weight is 138.7   Right colectomy she had in October 2005 for colon carcinoma. Last cololnoscopy in 2011: Single small diverticulum at sigmoid colon, otherwise normal.   CMP     Component Value Date/Time   NA 142 03/21/2012 0900   K 3.4* 03/21/2012 0900   CL 101 03/21/2012 0900   CO2 30 03/21/2012 0900   GLUCOSE 100* 03/21/2012 0900   BUN 11 03/21/2012 0900   CREATININE 0.72 03/21/2012 0900   CALCIUM 9.7 03/21/2012 0900   PROT 6.8 03/21/2012 0900   ALBUMIN 3.6 03/21/2012 0900   AST 27 03/21/2012 0900   ALT 21 03/21/2012 0900   ALKPHOS 58 03/21/2012 0900   BILITOT 0.4 03/21/2012 0900   GFRNONAA 81* 03/21/2012 0900   GFRAA >90 03/21/2012 0900       Review of Systems  See hpi Past Medical History  Diagnosis Date  . Hypertension   . Colon cancer   . RUQ pain   . Rheumatoid arthritis   . Cervicalgia   . Degeneration of cervical intervertebral disc   . Pinched nerve in shoulder   . Yeast infection    Current Outpatient Prescriptions  Medication Sig Dispense Refill  . Calcium Carbonate-Vitamin D (CALCIUM-VITAMIN D) 500-200 MG-UNIT per tablet Take 1 tablet by mouth.        . ergocalciferol (VITAMIN D2) 50000 UNITS capsule Take 50,000 Units by mouth once a  week.        Marland Kitchen HYDROcodone-acetaminophen (VICODIN) 5-500 MG per tablet Take 1 tablet by mouth. Patient states that she takes 1 - 1 1/2 at night      . leflunomide (ARAVA) 10 MG tablet Take 20 mg by mouth daily.       Marland Kitchen lisinopril (PRINIVIL,ZESTRIL) 10 MG tablet Take 10 mg by mouth daily.        . Magnesium 250 MG TABS Take by mouth as needed.      . Omega-3 Fatty Acids (FISH OIL) 1000 MG CAPS Take 2 capsules by mouth daily. Out right now but normally takes daily.      . potassium chloride SA (KLOR-CON M15) 15 MEQ tablet Take 1 tablet (15 mEq total) by mouth daily.  30 tablet  0  . Probiotic Product (PROBIOTIC FORMULA) CAPS Take 1 capsule by mouth daily.      . vitamin B-12 (CYANOCOBALAMIN) 1000 MCG tablet Take 1,000 mcg by mouth daily. Takes occasionally      . vitamin C (ASCORBIC ACID) 500 MG tablet Take 500 mg by mouth daily. Takes occasionally      . lansoprazole (PREVACID) 30 MG capsule Take 1 capsule (30 mg total)  by mouth daily.  30 capsule  11   No current facility-administered medications for this visit.   Past Surgical History  Procedure Laterality Date  . Colonoscopy  04/02/2009  . Colonoscopy  06/04/05  . Hemicolectomy  10/05  . Cholecystectomy  2002  . Appendectomy  with hysterectomy  . Abdominal hysterectomy  1960'S  . Back surgery  12/08/10    plate/screws C3 C4   Allergies  Allergen Reactions  . Iodinated Diagnostic Agents Anaphylaxis    08-12-10 Pt had severe reaction with itching/hives/loss of consciousness within 5 minutes of CEPI today.  She received both Kenalog and Iondinated Contrast, so not certain which agent caused reaction.  If patient returns for another CEPI, Dr. Alfredo Batty considers using Decadron and a 13-hour prep.  jkl  . Nerve Block Tray Anaphylaxis    Patient states that she was sent to have Epidural for her shoulder. After rec'ving she remembers saying that she was itching all over  And that's it. She was transported to hospital  By EMS. Stayed overnight.   Claudine Mouton Derivatives Hives and Nausea And Vomiting  . Triamcinolone Acetonide Anaphylaxis    08-12-10  Pt had severe reaction with itching/hives/loss of consciousness within five minutes of CEPI today.  She received both Kenalog and Iodinated Contrast, so not certain which agent caused reaction.  If pt returns for another cervical epidural steroid injection, Dr. Alfredo Batty suggests using Decadron and a 13-hr prep.  JKL          Objective:   Physical Exam  Filed Vitals:   03/28/12 1450  BP: 146/68  Pulse: 72  Temp: 98.4 F (36.9 C)  Height: 5\' 2"  (1.575 m)  Weight: 138 lb 11.2 oz (62.914 kg)   Alert and oriented. Skin warm and dry. Oral mucosa is moist.   . Sclera anicteric, conjunctivae is pink. Thyroid not enlarged. No cervical lymphadenopathy. Lungs clear. Heart regular rate and rhythm.  Abdomen is soft. Bowel sounds are positive. No hepatomegaly. No abdominal masses felt Epigastric  tenderness.  No edema to lower extremities.       Assessment:    Epigastric pain: PUD needs to be ruled out.  She also is due for a surveillance colonoscopy for hx of colon cancer.   Plan:    EGD/Colonoscopy. Align 20 boxes given to patient and Dexilant 4 box given to patient (samples)

## 2012-03-29 MED ORDER — PEG-KCL-NACL-NASULF-NA ASC-C 100 G PO SOLR: 1.0000 | Freq: Once | ORAL | Status: DC

## 2012-03-31 ENCOUNTER — Encounter (HOSPITAL_COMMUNITY): Payer: Self-pay | Admitting: Pharmacy Technician

## 2012-04-08 ENCOUNTER — Encounter (HOSPITAL_COMMUNITY): Payer: Self-pay | Admitting: *Deleted

## 2012-04-08 ENCOUNTER — Ambulatory Visit (HOSPITAL_COMMUNITY)
Admission: RE | Admit: 2012-04-08 | Discharge: 2012-04-08 | Disposition: A | Discharge status: Home / Self Care | Payer: Medicare Other | Source: Ambulatory Visit | Attending: Internal Medicine | Admitting: Internal Medicine

## 2012-04-08 ENCOUNTER — Encounter (HOSPITAL_COMMUNITY)
Admission: RE | Disposition: A | Discharge status: Home / Self Care | Payer: Self-pay | Source: Ambulatory Visit | Attending: Internal Medicine

## 2012-04-08 DIAGNOSIS — Z85038 Personal history of other malignant neoplasm of large intestine: Secondary | ICD-10-CM | POA: Insufficient documentation

## 2012-04-08 DIAGNOSIS — K2100 Gastro-esophageal reflux disease with esophagitis, without bleeding: Secondary | ICD-10-CM | POA: Insufficient documentation

## 2012-04-08 DIAGNOSIS — R1013 Epigastric pain: Secondary | ICD-10-CM | POA: Insufficient documentation

## 2012-04-08 DIAGNOSIS — I10 Essential (primary) hypertension: Secondary | ICD-10-CM | POA: Insufficient documentation

## 2012-04-08 DIAGNOSIS — K5730 Diverticulosis of large intestine without perforation or abscess without bleeding: Secondary | ICD-10-CM | POA: Insufficient documentation

## 2012-04-08 DIAGNOSIS — G8929 Other chronic pain: Secondary | ICD-10-CM

## 2012-04-08 HISTORY — PX: COLONOSCOPY WITH ESOPHAGOGASTRODUODENOSCOPY (EGD): SHX5779

## 2012-04-08 SURGERY — COLONOSCOPY WITH ESOPHAGOGASTRODUODENOSCOPY (EGD)
Anesthesia: Moderate Sedation

## 2012-04-08 MED ORDER — OMEPRAZOLE MAGNESIUM 20 MG PO TBEC: 40.0000 mg | DELAYED_RELEASE_TABLET | Freq: Every day | ORAL | Status: DC

## 2012-04-08 MED ORDER — STERILE WATER FOR IRRIGATION IR SOLN
Status: DC | PRN
  Administered 2012-04-08 (×2)

## 2012-04-08 MED ORDER — BUTAMBEN-TETRACAINE-BENZOCAINE 2-2-14 % EX AERO
INHALATION_SPRAY | CUTANEOUS | Status: DC | PRN
  Administered 2012-04-08: 2 via TOPICAL

## 2012-04-08 MED ORDER — MIDAZOLAM HCL 5 MG/5ML IJ SOLN
INTRAMUSCULAR | Status: AC
  Filled 2012-04-08: qty 10

## 2012-04-08 MED ORDER — MEPERIDINE HCL 50 MG/ML IJ SOLN
INTRAMUSCULAR | Status: DC | PRN
  Administered 2012-04-08 (×2): 25 mg via INTRAVENOUS

## 2012-04-08 MED ORDER — MEPERIDINE HCL 50 MG/ML IJ SOLN
INTRAMUSCULAR | Status: AC
  Filled 2012-04-08: qty 1

## 2012-04-08 MED ORDER — SODIUM CHLORIDE 0.9 % IV SOLN
INTRAVENOUS | Status: DC
  Administered 2012-04-08: 11:00:00 via INTRAVENOUS

## 2012-04-08 MED ORDER — MIDAZOLAM HCL 5 MG/5ML IJ SOLN
INTRAMUSCULAR | Status: DC | PRN
  Administered 2012-04-08: 2 mg via INTRAVENOUS
  Administered 2012-04-08: 1 mg via INTRAVENOUS
  Administered 2012-04-08: 2 mg via INTRAVENOUS

## 2012-04-08 NOTE — Op Note (Signed)
EGD PROCEDURE REPORT  PATIENT:  [name]  MR#:  [id number] Birthdate:  [birth year], 79 y.o., female Endoscopist:  Dr. Malissa Hippo, MD Referred By:  Dr. Madelin Rear. Sherwood Gambler, MD Procedure Date: 04/08/2012  Procedure:   EGD & Colonoscopy  Indications:  Patient is 79 year old Caucasian female with recurrent epigastric pain. She is feeling better since her PPI was changed. She has history of colon carcinoma. She is status post right hemi-colectomy in 2005. Her last colonoscopy was 3 years ago            Informed Consent:  The risks, benefits, alternatives & imponderables which include, but are not limited to, bleeding, infection, perforation, drug reaction and potential missed lesion have been reviewed.  The potential for biopsy, lesion removal, esophageal dilation, etc. have also been discussed.  Questions have been answered.  All parties agreeable.  Please see history & physical in medical record for more information.  Medications:  Demerol 50 mg IV Versed 5 mg IV Cetacaine spray topically for oropharyngeal anesthesia  EGD  Description of procedure:  The endoscope was introduced through the mouth and advanced to the second portion of the duodenum without difficulty or limitations. The mucosal surfaces were surveyed very carefully during advancement of the scope and upon withdrawal.  Findings:  Esophagus:  Mucosa of the esophagus was normal. Erythema and edema noted at GE junction. No ring or stricture present. GEJ:  40 cm Stomach:  Stomach was empty and distended very well with insufflation. Folds in the proximal stomach are normal. Examination mucosa at body, antrum, pyloric channel, angularis, fundus and cardia was normal. Duodenum:  Normal bulbar and post bulbar mucosa. Ampulla of Vater  was also identified and appeared to be normal.  Therapeutic/Diagnostic Maneuvers Performed:  None  COLONOSCOPY Description of procedure:  After a digital rectal exam was performed, that  colonoscope was advanced from the anus through the rectum and colon to the area of the cecum, ileocecal valve and appendiceal orifice. The cecum was deeply intubated. These structures were well-seen and photographed for the record. From the level of the cecum and ileocecal valve, the scope was slowly and cautiously withdrawn. The mucosal surfaces were carefully surveyed utilizing scope tip to flexion to facilitate fold flattening as needed. The scope was pulled down into the rectum where a thorough exam including retroflexion was performed.  Findings:   Prep excellent. Wide-open ileocolonic anastomosis located in the vicinity of hepatic flexure. Few small diverticula at sigmoid colon but no other abnormalities noted. Normal rectal mucosa and anal rectal junction.  Therapeutic/Diagnostic Maneuvers Performed:  None  Complications:  None  Cecal Withdrawal Time:  7 minutes  Impression:  Mild changes of reflux esophagitis limited to GE junction. No evidence of peptic ulcer disease or gastritis. Normal colonoscopy to ileocolonic anastomosis except few small diverticula at sigmoid colon.  Recommendations:  Begin omeprazole 40 mg by mouth every morning when samples of Dexilant used. Next colonoscopy in 5 years.  REHMAN,NAJEEB U  04/08/2012 12:18 PM  CC: Dr. Cassell Smiles., MD & Dr. Bonnetta Barry ref. provider found

## 2012-04-08 NOTE — H&P (Addendum)
[name] is an 79 y.o. female.   Chief Complaint: Patient is here for EGD and colonoscopy. HPI: Patient is 79 year old Caucasian female who has acute on chronic epigastric pain not associated with nausea vomiting melena or rectal bleeding. She is feeling better with a different PPI. At times pain is quite sharp. She has had her gallbladder removed remotely. She does not take NSAIDs. She is also undergoing colonoscopy for surveillance purposes. She has history of colon carcinoma and underwent right hemi-colectomy in 2005. Her last colonoscopy was 3 years ago.  Past Medical History  Diagnosis Date  . Hypertension   . Colon cancer   . RUQ pain   . Rheumatoid arthritis   . Cervicalgia   . Degeneration of cervical intervertebral disc   . Pinched nerve in shoulder   . Yeast infection     Past Surgical History  Procedure Laterality Date  . Colonoscopy  04/02/2009  . Colonoscopy  06/04/05  . Hemicolectomy  10/05  . Cholecystectomy  2002  . Appendectomy  with hysterectomy  . Abdominal hysterectomy  1960'S  . Back surgery  12/08/10    plate/screws C3 C4    Family History  Problem Relation Age of Onset  . Rheum arthritis Mother   . Parkinsonism Father   . Hiatal hernia Sister   . Healthy Brother   . Healthy Daughter   . Healthy Son   . Healthy Son    Social History:  reports that she has never smoked. She has never used smokeless tobacco. She reports that she does not drink alcohol or use illicit drugs.  Allergies:  Allergies  Allergen Reactions  . Iodinated Diagnostic Agents Anaphylaxis    08-12-10 Pt had severe reaction with itching/hives/loss of consciousness within 5 minutes of CEPI today.  She received both Kenalog and Iondinated Contrast, so not certain which agent caused reaction.  If patient returns for another CEPI, Dr. Alfredo Batty considers using Decadron and a 13-hour prep.  jkl  . Nerve Block Tray Anaphylaxis    Patient states that she was sent to have Epidural for  her shoulder. After rec'ving she remembers saying that she was itching all over  And that's it. She was transported to hospital  By EMS. Stayed overnight.  Claudine Mouton Derivatives Hives and Nausea And Vomiting  . Triamcinolone Acetonide Anaphylaxis    08-12-10  Pt had severe reaction with itching/hives/loss of consciousness within five minutes of CEPI today.  She received both Kenalog and Iodinated Contrast, so not certain which agent caused reaction.  If pt returns for another cervical epidural steroid injection, Dr. Alfredo Batty suggests using Decadron and a 13-hr prep.  JKL    Medications Prior to Admission  Medication Sig Dispense Refill  . Calcium Carbonate-Vitamin D (CALCIUM-VITAMIN D) 500-200 MG-UNIT per tablet Take 1 tablet by mouth daily.       Marland Kitchen dexlansoprazole (DEXILANT) 60 MG capsule Take 60 mg by mouth daily.      . ergocalciferol (VITAMIN D2) 50000 UNITS capsule Take 50,000 Units by mouth once a week.        Marland Kitchen HYDROcodone-acetaminophen (VICODIN) 5-500 MG per tablet Take 1 tablet by mouth at bedtime as needed for pain.       Marland Kitchen leflunomide (ARAVA) 20 MG tablet Take 20 mg by mouth daily.      Marland Kitchen lisinopril (PRINIVIL,ZESTRIL) 10 MG tablet Take 10 mg by mouth daily.        . Magnesium 250 MG TABS Take by mouth as needed.      Marland Kitchen  Omega-3 Fatty Acids (FISH OIL) 1000 MG CAPS Take 2 capsules by mouth daily.       . peg 3350 powder (MOVIPREP) 100 G SOLR Take 1 kit (100 g total) by mouth once.  1 kit  0  . potassium chloride SA (KLOR-CON M15) 15 MEQ tablet Take 1 tablet (15 mEq total) by mouth daily.  30 tablet  0  . Probiotic Product (PROBIOTIC FORMULA) CAPS Take 1 capsule by mouth once a week.       . vitamin B-12 (CYANOCOBALAMIN) 1000 MCG tablet Take 1,000 mcg by mouth daily. Takes occasionally      . vitamin C (ASCORBIC ACID) 500 MG tablet Take 500 mg by mouth daily. Takes occasionally        No results found for this or any previous visit (from the past 48 hour(s)). No results  found.  ROS  Blood pressure 159/91, pulse 98, temperature 98.2 F (36.8 C), temperature source Oral, resp. rate 24, height 5\' 2"  (1.575 m), weight 136 lb (61.689 kg), SpO2 96.00%. Physical Exam  Constitutional: She appears well-developed and well-nourished.  HENT:  Mouth/Throat: Oropharynx is clear and moist.  Eyes: Conjunctivae are normal. No scleral icterus.  Neck: No thyromegaly present.  Cardiovascular: Normal rate, regular rhythm and normal heart sounds.   No murmur heard. Respiratory: Effort normal and breath sounds normal.  GI: She exhibits no distension and no mass. Tenderness: mild midepigastric tenderness.  Musculoskeletal: She exhibits no edema.  Lymphadenopathy:    She has no cervical adenopathy.  Neurological: She is alert.  Skin: Skin is warm and dry.     Assessment/Plan Acute on chronic epigastric pain. History of colon carcinoma. EGD and colonoscopy.  Sanela Evola U 04/08/2012, 11:37 AM

## 2012-04-12 ENCOUNTER — Encounter (INDEPENDENT_AMBULATORY_CARE_PROVIDER_SITE_OTHER): Payer: Self-pay

## 2012-04-12 ENCOUNTER — Encounter (HOSPITAL_COMMUNITY): Payer: Self-pay | Admitting: Internal Medicine

## 2012-04-25 ENCOUNTER — Telehealth: Payer: Self-pay | Admitting: Obstetrics and Gynecology

## 2012-04-25 NOTE — Telephone Encounter (Signed)
Routed to Dr. Ferguson. JSY 

## 2012-04-26 NOTE — Telephone Encounter (Signed)
Phone call followup regarding [name]'s treatment for vulvar dystrophy. Notes and prior paper chart indicates in March 2014 patient was placed on testosterone 2% topical for suspected vulvar dystrophy. This is been used for a month and the patient's irritation in the rectal area is worse. She was advised to stop the testosterone, make followup appointment has been made for next Friday, 10 days. Patient using over-the-counter cream and present. If symptoms not improved in 2 days which will call back for attempt to move her appointment closer.

## 2012-04-28 ENCOUNTER — Encounter (INDEPENDENT_AMBULATORY_CARE_PROVIDER_SITE_OTHER): Payer: Self-pay | Admitting: *Deleted

## 2012-05-02 ENCOUNTER — Encounter: Payer: Self-pay | Admitting: Radiology

## 2012-05-03 ENCOUNTER — Encounter: Payer: Self-pay | Admitting: *Deleted

## 2012-05-04 ENCOUNTER — Encounter: Payer: Self-pay | Admitting: Obstetrics and Gynecology

## 2012-05-04 ENCOUNTER — Ambulatory Visit (INDEPENDENT_AMBULATORY_CARE_PROVIDER_SITE_OTHER): Payer: Medicare Other | Admitting: Obstetrics and Gynecology

## 2012-05-04 VITALS — BP 172/100 | Wt 138.4 lb

## 2012-05-04 DIAGNOSIS — R82998 Other abnormal findings in urine: Secondary | ICD-10-CM

## 2012-05-04 DIAGNOSIS — N904 Leukoplakia of vulva: Secondary | ICD-10-CM | POA: Insufficient documentation

## 2012-05-04 DIAGNOSIS — R300 Dysuria: Secondary | ICD-10-CM

## 2012-05-04 LAB — POCT URINALYSIS DIPSTICK: Glucose, UA: NEGATIVE

## 2012-05-04 NOTE — Progress Notes (Signed)
Patient ID: [name], female   DOB: April 08, 1933, 79 y.o.   MRN: [id number] [name] returns for followup regarding vulvar dystrophy Patient continues to have irritation particularly after voiding. She has nocturnal pruritus. She is burning upon voiding Exam shows cracking and irritation particularly on the posterior fourchette. The labia minora have become obliterated by the dystrophy process. Plan: Drive vulva regimen with tissue tuckled between labial and gluteal creases, changing of tissue at least 6 times daily Desitin to be used prior to voiding. Followup 2 weeks

## 2012-05-04 NOTE — Patient Instructions (Addendum)
Please use the tissue stuck between skin folds at least 6 times daily  Do not use clobetasol anymore May use Desitin prior to urination

## 2012-05-06 ENCOUNTER — Ambulatory Visit: Payer: Self-pay | Admitting: Obstetrics and Gynecology

## 2012-05-09 ENCOUNTER — Ambulatory Visit (INDEPENDENT_AMBULATORY_CARE_PROVIDER_SITE_OTHER): Payer: Medicare Other | Admitting: Internal Medicine

## 2012-05-18 ENCOUNTER — Ambulatory Visit (INDEPENDENT_AMBULATORY_CARE_PROVIDER_SITE_OTHER): Payer: Medicare Other | Admitting: Obstetrics and Gynecology

## 2012-05-18 ENCOUNTER — Encounter: Payer: Self-pay | Admitting: Obstetrics and Gynecology

## 2012-05-18 VITALS — BP 178/100 | Ht 62.0 in | Wt 138.6 lb

## 2012-05-18 DIAGNOSIS — N904 Leukoplakia of vulva: Secondary | ICD-10-CM

## 2012-05-18 MED ORDER — HYDROCORTISONE 2.5 % RE CREA: TOPICAL_CREAM | Freq: Two times a day (BID) | RECTAL | Status: DC

## 2012-05-18 NOTE — Patient Instructions (Signed)
Use proctocream twice daily

## 2012-05-18 NOTE — Progress Notes (Signed)
  Assessment:  refractory vulvar dystrophy, involving perianal and perineal tissues will reschedule patient for vulvar biopsy, and use Proct ocream in the meantime   Plan:  Return for vulvar biopsy rx Proctocream to see if symptomatic relief possible    Subjective:  [name] is a 79 y.o. female, G4P4, who presents for f/u dystophy. Patient continues to remain irritated, and burning with defecation or voiding She's been tried on clobetasol which made things worse, and has had some improvement in her ability to sleep at night when using Benadryl. She remains irritated and frustrated The following portions of the patient's history were reviewed and updated as appropriate: allergies, current medications, past medical & surgical history, & past family history.   There is no significant family history of breast or ovarian cancer.    Review of Systems Pertinent items are noted in HPI. Breast:Negative for breast lump,nipple discharge or nipple retraction Gastrointestinal: Negative for abdominal pain, change in bowel habits or rectal bleeding GU: Negative for dysuria, frequency, urgency or incontinence.   GYN: No LMP recorded. Patient has had a hysterectomy.   Objective:  BP 178/100  Ht 5\' 2"  (1.575 m)  Wt 138 lb 9.6 oz (62.869 kg)  BMI 25.34 kg/m2    BMI: Body mass index is 25.34 kg/(m^2).  General Appearance: Alert, appropriate appearance for age. No acute distress HEENT: Grossly normal Neck / Thyroid: Supple, no masses, nodes or enlargement Cardiovascular: Regular rate and rhythm. S1, S2, no murmur Lungs: Clear to auscultation bilaterally Back: No CVA tenderness Gastrointestinal: Soft, non-tender, no masses or organomegaly Pelvic Exam: External genitalia: Atrophic tissues with loss of labia minora. A 2 cm circumferential area of erythema and leukoplakia surround the anus Rectovaginal: not indicated  Christin Bach MD

## 2012-05-25 ENCOUNTER — Ambulatory Visit: Payer: Medicare Other | Admitting: Obstetrics and Gynecology

## 2012-11-16 ENCOUNTER — Other Ambulatory Visit (HOSPITAL_COMMUNITY): Payer: Self-pay | Admitting: Internal Medicine

## 2012-11-28 ENCOUNTER — Other Ambulatory Visit (HOSPITAL_COMMUNITY): Payer: Self-pay | Admitting: Family Medicine

## 2012-11-28 DIAGNOSIS — Z139 Encounter for screening, unspecified: Secondary | ICD-10-CM

## 2013-01-09 ENCOUNTER — Ambulatory Visit (HOSPITAL_COMMUNITY)
Admission: RE | Admit: 2013-01-09 | Discharge: 2013-01-09 | Disposition: A | Discharge status: Home / Self Care | Payer: Medicare Other | Source: Ambulatory Visit | Attending: Family Medicine | Admitting: Family Medicine

## 2013-01-09 DIAGNOSIS — Z139 Encounter for screening, unspecified: Secondary | ICD-10-CM

## 2013-01-09 DIAGNOSIS — Z1231 Encounter for screening mammogram for malignant neoplasm of breast: Secondary | ICD-10-CM | POA: Insufficient documentation

## 2013-01-31 ENCOUNTER — Encounter: Payer: Self-pay | Admitting: *Deleted

## 2013-01-31 DIAGNOSIS — R000 Tachycardia, unspecified: Secondary | ICD-10-CM | POA: Insufficient documentation

## 2013-02-01 ENCOUNTER — Ambulatory Visit (INDEPENDENT_AMBULATORY_CARE_PROVIDER_SITE_OTHER): Payer: Medicare Other | Admitting: Cardiovascular Disease

## 2013-02-01 VITALS — BP 190/98 | HR 105 | Ht 62.0 in | Wt 137.0 lb

## 2013-02-01 DIAGNOSIS — R000 Tachycardia, unspecified: Secondary | ICD-10-CM

## 2013-02-01 DIAGNOSIS — I10 Essential (primary) hypertension: Secondary | ICD-10-CM

## 2013-02-01 LAB — CBC
HCT: 41.1 % (ref 36.0–46.0)
HEMOGLOBIN: 13.8 g/dL (ref 12.0–15.0)
MCH: 29.9 pg (ref 26.0–34.0)
MCHC: 33.6 g/dL (ref 30.0–36.0)
MCV: 89.2 fL (ref 78.0–100.0)
PLATELETS: 217 10*3/uL (ref 150–400)
RBC: 4.61 MIL/uL (ref 3.87–5.11)
RDW: 14.1 % (ref 11.5–15.5)
WBC: 5.3 10*3/uL (ref 4.0–10.5)

## 2013-02-01 LAB — TSH: TSH: 0.465 u[IU]/mL (ref 0.350–4.500)

## 2013-02-01 LAB — T4, FREE: FREE T4: 1.13 ng/dL (ref 0.80–1.80)

## 2013-02-01 NOTE — Progress Notes (Signed)
Patient ID: [name], female   DOB: Jun 03, 1933, 80 y.o.   MRN: [id number]       CARDIOLOGY CONSULT NOTE  Patient ID: [name] MRN: [id number] DOB/AGE: Sep 15, 1933 80 y.o.  Admit date: (Not on file) Primary Physician Glo Herring., MD  Reason for Consultation: tachycardia  HPI: The patient is a 80 year old woman who has been referred for the evaluation and treatment of tachycardia. She has a history of hypertension, hyperlipidemia and GERD. She exercises quite regularly, at least 4 times per week at the [facility]. As per records PCPs office and upon speaking with the patient, when she exercises at the gym, she has noticed that her heart rate rapidly increased to 170 beats per minute. She exercised on different machines and confirmed that this was the case. She later went home and lied down and after approximately 2 hours, her heart rate returned to normal. She was asymptomatic during this time. At her PCPs office, her heart rate was noted to be 140 beats per minute.  These episodes at the gym occurred approximately 7 weeks ago. Over a period of about 2 or 3 days, she also noted that her heart beat was irregular. She denied any associated chest pain or shortness of breath. She has benign positional vertigo and has dizziness related to this. She passed out approximately 2 months ago but said this occurred when she stood up too quickly and rushed to the bathroom. She denies having had any recent blood work. She says that approximately 2 years ago she had a heart evaluation at [facility], although I did not find any results of stress testing or echocardiography, and am uncertain as to what her testing involved.  She said that her blood pressure is normally well controlled and her systolic readings are usually in the 130 mm mercury range, but she was notified shortly before this office visit that one of her close friends passed away in his sleep and this alarmed her.   Fam: Her sister  reportedly had a history of tachycardia and required an ablation. Her father received a pacemaker in his 58s.     Allergies  Allergen Reactions  . Iodinated Diagnostic Agents Anaphylaxis    08-12-10 Pt had severe reaction with itching/hives/loss of consciousness within 5 minutes of CEPI today.  She received both Kenalog and Iondinated Contrast, so not certain which agent caused reaction.  If patient returns for another CEPI, Dr. Jobe Igo considers using Decadron and a 13-hour prep.  jkl  . Nerve Block Tray Anaphylaxis    Patient states that she was sent to have Epidural for her shoulder. After rec'ving she remembers saying that she was itching all over  And that's it. She was transported to hospital  By EMS. Stayed overnight.  Bufford Spikes Derivatives Hives and Nausea And Vomiting  . Triamcinolone Acetonide Anaphylaxis    08-12-10  Pt had severe reaction with itching/hives/loss of consciousness within five minutes of CEPI today.  She received both Kenalog and Iodinated Contrast, so not certain which agent caused reaction.  If pt returns for another cervical epidural steroid injection, Dr. Jobe Igo suggests using Decadron and a 13-hr prep.  JKL    Current Outpatient Prescriptions  Medication Sig Dispense Refill  . ergocalciferol (VITAMIN D2) 50000 UNITS capsule Take 50,000 Units by mouth once a week.        . fluticasone (FLONASE) 50 MCG/ACT nasal spray Place 1 spray into both nostrils daily.      Marland Kitchen HYDROcodone-acetaminophen (VICODIN) 5-500  MG per tablet Take 1 tablet by mouth at bedtime as needed for pain.       . hydrocortisone (PROCTOZONE-HC) 2.5 % rectal cream Place rectally 2 (two) times daily.  30 g  2  . leflunomide (ARAVA) 20 MG tablet Take 20 mg by mouth daily.      Marland Kitchen lisinopril (PRINIVIL,ZESTRIL) 10 MG tablet Take 10 mg by mouth daily.        Marland Kitchen omeprazole (PRILOSEC) 20 MG capsule Take 20 mg by mouth 2 (two) times daily before a meal.      . Probiotic Product (PROBIOTIC FORMULA) CAPS  Take 1 capsule by mouth every other day.        No current facility-administered medications for this visit.    Past Medical History  Diagnosis Date  . Hypertension   . Colon cancer   . RUQ pain   . Rheumatoid arthritis(714.0)   . Cervicalgia   . Degeneration of cervical intervertebral disc   . Pinched nerve in shoulder   . Yeast infection   . Vulvar dystrophy     Past Surgical History  Procedure Laterality Date  . Colonoscopy  04/02/2009  . Colonoscopy  06/04/05  . Hemicolectomy  10/05  . Cholecystectomy  2002  . Appendectomy  with hysterectomy  . Abdominal hysterectomy  1960'S  . Back surgery  12/08/10    plate/screws C3 C4  . Colonoscopy with esophagogastroduodenoscopy (egd) N/A 04/08/2012    Procedure: COLONOSCOPY WITH ESOPHAGOGASTRODUODENOSCOPY (EGD);  Surgeon: Rogene Houston, MD;  Location: [facility];  Service: Endoscopy;  Laterality: N/A;  130-moved to [place] notified pt    History   Social History  . Marital Status: Married    Spouse Name: N/A    Number of Children: N/A  . Years of Education: N/A   Occupational History  . Not on file.   Social History Main Topics  . Smoking status: Never Smoker   . Smokeless tobacco: Never Used  . Alcohol Use: No  . Drug Use: No  . Sexual Activity: No   Other Topics Concern  . Not on file   Social History Narrative  . No narrative on file     Family History  Problem Relation Age of Onset  . Rheum arthritis Mother   . Parkinsonism Father   . Hiatal hernia Sister   . Healthy Brother   . Healthy Daughter   . Healthy Son   . Healthy Son      Prior to Admission medications   Medication Sig Start Date End Date Taking? Authorizing Provider  ergocalciferol (VITAMIN D2) 50000 UNITS capsule Take 50,000 Units by mouth once a week.     Yes Historical Provider, MD  fluticasone (FLONASE) 50 MCG/ACT nasal spray Place 1 spray into both nostrils daily.   Yes Historical Provider, MD  HYDROcodone-acetaminophen  (VICODIN) 5-500 MG per tablet Take 1 tablet by mouth at bedtime as needed for pain.    Yes Historical Provider, MD  hydrocortisone (PROCTOZONE-HC) 2.5 % rectal cream Place rectally 2 (two) times daily. 05/18/12  Yes Jonnie Kind, MD  leflunomide (ARAVA) 20 MG tablet Take 20 mg by mouth daily.   Yes Historical Provider, MD  lisinopril (PRINIVIL,ZESTRIL) 10 MG tablet Take 10 mg by mouth daily.     Yes Historical Provider, MD  omeprazole (PRILOSEC) 20 MG capsule Take 20 mg by mouth 2 (two) times daily before a meal.   Yes Historical Provider, MD  Probiotic Product (PROBIOTIC FORMULA) CAPS Take  1 capsule by mouth every other day.    Yes Historical Provider, MD     Review of systems complete and found to be negative unless listed above in HPI     Physical exam Blood pressure 190/98, pulse 105, height 5\' 2"  (1.575 m), weight 137 lb (62.143 kg).  Repeat HR: 96 bpm  General: NAD Neck: No JVD, no thyromegaly or thyroid nodule.  Lungs: Clear to auscultation bilaterally with normal respiratory effort. CV: Nondisplaced PMI.  Heart regular rhythm, HR at upper limits of normal, normal S1/S2, no S3/S4, no murmur.  No peripheral edema.  No carotid bruit.  Normal pedal pulses.  Abdomen: Soft, nontender, no hepatosplenomegaly, no distention.  Skin: Intact without lesions or rashes.  Neurologic: Alert and oriented x 3.  Psych: Normal affect. Extremities: No clubbing or cyanosis.  HEENT: Normal.   Labs:   Lab Results  Component Value Date   WBC 4.8 03/21/2012   HGB 13.3 03/21/2012   HCT 39.7 03/21/2012   MCV 88.8 03/21/2012   PLT 185 03/21/2012   No results found for this basename: NA, K, CL, CO2, BUN, CREATININE, CALCIUM, LABALBU, PROT, BILITOT, ALKPHOS, ALT, AST, GLUCOSE,  in the last 168 hours Lab Results  Component Value Date   CKTOTAL 81 08/24/2008   CKMB 2.7 08/24/2008   TROPONINI  Value: 0.03        NO INDICATION OF MYOCARDIAL INJURY. 08/24/2008    Lab Results  Component Value Date   CHOL   Value: 209        ATP III CLASSIFICATION:  <200     mg/dL   Desirable  200-239  mg/dL   Borderline High  >=240    mg/dL   High       * 08/25/2008   Lab Results  Component Value Date   HDL 44 08/25/2008   Lab Results  Component Value Date   LDLCALC  Value: 140        Total Cholesterol/HDL:CHD Risk Coronary Heart Disease Risk Table                     Men   Women  1/2 Average Risk   3.4   3.3  Average Risk       5.0   4.4  2 X Average Risk   9.6   7.1  3 X Average Risk  23.4   11.0        Use the calculated Patient Ratio above and the CHD Risk Table to determine the patient's CHD Risk.        ATP III CLASSIFICATION (LDL):  <100     mg/dL   Optimal  100-129  mg/dL   Near or Above                    Optimal  130-159  mg/dL   Borderline  160-189  mg/dL   High  >190     mg/dL   Very High* 08/25/2008   Lab Results  Component Value Date   TRIG 126 08/25/2008   Lab Results  Component Value Date   CHOLHDL 4.8 08/25/2008   No results found for this basename: LDLDIRECT       EKG: Sinus rhythm, rate 94 bpm, axis within normal limits, intervals within normal limits, nonspecific ST-T abnormality with mild T wave inversions in V2-4.  Studies: No results found.  ASSESSMENT AND PLAN:  1. Tachycardia: In order to rule out a physiologic cause of  tachycardia, I will check thyroid function studies to rule out hyperthyroidism and also check a CBC to rule out anemia. I will also have her wear a 24-hour Holter monitor to see what her heart rate averages. I will followup with her in one week. I would consider the addition of atenolol 25 mg daily if she were to have an inappropriate sinus tachycardia. 2. HTN: I will monitor this. She believes it may be elevated this morning due to receiving alarming news of a friend that suddenly passed away. If it remains elevated, I will increase the dose of lisinopril.  Dispo: f/u 1 week.   Signed: Kate Sable, M.D., F.A.C.C.  02/01/2013, 9:17 AM

## 2013-02-01 NOTE — Patient Instructions (Signed)
Your physician recommends that you schedule a follow-up appointment in: [place]  Your physician recommends that you return for lab work in: TODAY ([place] T4, TSH,CBC)  Your physician has recommended that you wear a holter monitor. Holter monitors are medical devices that record the heart's electrical activity. Doctors most often use these monitors to diagnose arrhythmias. Arrhythmias are problems with the speed or rhythm of the heartbeat. The monitor is a small, portable device. You can wear one while you do your normal daily activities. This is usually used to diagnose what is causing palpitations/syncope (passing out).  WE WILL CALL YOU WITH YOUR TEST RESULTS/INSTRUCTIONS/NEXT STEPS ONCE RECEIVED BY THE PROVIDER

## 2013-02-02 ENCOUNTER — Ambulatory Visit (HOSPITAL_COMMUNITY)
Admission: RE | Admit: 2013-02-02 | Discharge: 2013-02-02 | Disposition: A | Discharge status: Home / Self Care | Payer: Medicare Other | Source: Ambulatory Visit | Attending: Cardiovascular Disease | Admitting: Cardiovascular Disease

## 2013-02-02 DIAGNOSIS — R000 Tachycardia, unspecified: Secondary | ICD-10-CM

## 2013-02-02 NOTE — Progress Notes (Signed)
24 hour Holter Monitor in progress. 

## 2013-02-09 ENCOUNTER — Ambulatory Visit (INDEPENDENT_AMBULATORY_CARE_PROVIDER_SITE_OTHER): Payer: Medicare Other | Admitting: Cardiovascular Disease

## 2013-02-09 ENCOUNTER — Encounter: Payer: Self-pay | Admitting: Cardiovascular Disease

## 2013-02-09 VITALS — BP 168/86 | HR 102 | Ht 62.0 in | Wt 137.0 lb

## 2013-02-09 DIAGNOSIS — I10 Essential (primary) hypertension: Secondary | ICD-10-CM

## 2013-02-09 DIAGNOSIS — R000 Tachycardia, unspecified: Secondary | ICD-10-CM

## 2013-02-09 MED ORDER — METOPROLOL SUCCINATE ER 25 MG PO TB24: 25.0000 mg | ORAL_TABLET | Freq: Every day | ORAL | Status: DC

## 2013-02-09 NOTE — Patient Instructions (Signed)
Your physician recommends that you schedule a follow-up appointment in: 3 weeks   Your physician has recommended you make the following change in your medication:    1.START Toprol XL 25 mg daily

## 2013-02-09 NOTE — Progress Notes (Signed)
Patient ID: [name], female   DOB: Dec 10, 1933, 80 y.o.   MRN: [id number]      SUBJECTIVE: The patient returns for followup of cardiovascular testing performed for the evaluation of tachycardia. Her Holter monitor showed predominantly sinus rhythm with one episode of sinus tachycardia with a heart rate of 122 beats per minute. There were occasional PACs and very few PVCs. There was no evidence of a supraventricular tachyarrhythmia. Her hemoglobin, TSH and free T4 were all normal. She denies chest pain, palpitations, shortness of breath, lightheadedness, dizziness and leg swelling.    Allergies  Allergen Reactions  . Iodinated Diagnostic Agents Anaphylaxis    08-12-10 Pt had severe reaction with itching/hives/loss of consciousness within 5 minutes of CEPI today.  She received both Kenalog and Iondinated Contrast, so not certain which agent caused reaction.  If patient returns for another CEPI, Dr. Jobe Igo considers using Decadron and a 13-hour prep.  jkl  . Nerve Block Tray Anaphylaxis    Patient states that she was sent to have Epidural for her shoulder. After rec'ving she remembers saying that she was itching all over  And that's it. She was transported to hospital  By EMS. Stayed overnight.  Bufford Spikes Derivatives Hives and Nausea And Vomiting  . Triamcinolone Acetonide Anaphylaxis    08-12-10  Pt had severe reaction with itching/hives/loss of consciousness within five minutes of CEPI today.  She received both Kenalog and Iodinated Contrast, so not certain which agent caused reaction.  If pt returns for another cervical epidural steroid injection, Dr. Jobe Igo suggests using Decadron and a 13-hr prep.  JKL    Current Outpatient Prescriptions  Medication Sig Dispense Refill  . ergocalciferol (VITAMIN D2) 50000 UNITS capsule Take 50,000 Units by mouth once a week.        . fluticasone (FLONASE) 50 MCG/ACT nasal spray Place 1 spray into both nostrils daily.      Marland Kitchen  HYDROcodone-acetaminophen (VICODIN) 5-500 MG per tablet Take 1 tablet by mouth at bedtime as needed for pain.       . hydrocortisone (PROCTOZONE-HC) 2.5 % rectal cream Place rectally 2 (two) times daily.  30 g  2  . leflunomide (ARAVA) 20 MG tablet Take 20 mg by mouth daily.      Marland Kitchen lisinopril (PRINIVIL,ZESTRIL) 10 MG tablet Take 10 mg by mouth daily.        Marland Kitchen omeprazole (PRILOSEC) 20 MG capsule Take 20 mg by mouth 2 (two) times daily before a meal.      . Probiotic Product (PROBIOTIC FORMULA) CAPS Take 1 capsule by mouth every other day.        No current facility-administered medications for this visit.    Past Medical History  Diagnosis Date  . Hypertension   . Colon cancer   . RUQ pain   . Rheumatoid arthritis(714.0)   . Cervicalgia   . Degeneration of cervical intervertebral disc   . Pinched nerve in shoulder   . Yeast infection   . Vulvar dystrophy     Past Surgical History  Procedure Laterality Date  . Colonoscopy  04/02/2009  . Colonoscopy  06/04/05  . Hemicolectomy  10/05  . Cholecystectomy  2002  . Appendectomy  with hysterectomy  . Abdominal hysterectomy  1960'S  . Back surgery  12/08/10    plate/screws C3 C4  . Colonoscopy with esophagogastroduodenoscopy (egd) N/A 04/08/2012    Procedure: COLONOSCOPY WITH ESOPHAGOGASTRODUODENOSCOPY (EGD);  Surgeon: Rogene Houston, MD;  Location[facility];  Service: Endoscopy;  Laterality: N/A;  130-moved to [place] notified pt    History   Social History  . Marital Status: Married    Spouse Name: N/A    Number of Children: N/A  . Years of Education: N/A   Occupational History  . Not on file.   Social History Main Topics  . Smoking status: Never Smoker   . Smokeless tobacco: Never Used  . Alcohol Use: No  . Drug Use: No  . Sexual Activity: No   Other Topics Concern  . Not on file   Social History Narrative  . No narrative on file     Filed Vitals:   02/09/13 1617  BP: 168/86  Pulse: 102  Height: 5\' 2"   (1.575 m)  Weight: 137 lb (62.143 kg)    PHYSICAL EXAM General: NAD Neck: No JVD, no thyromegaly or thyroid nodule.  Lungs: Clear to auscultation bilaterally with normal respiratory effort. CV: Nondisplaced PMI.  Heart regular but tachycardic, normal S1/S2, no S3/S4, no murmur.  No peripheral edema.  No carotid bruit.  Normal pedal pulses.  Abdomen: Soft, nontender, no hepatosplenomegaly, no distention.  Neurologic: Alert and oriented x 3.  Psych: Normal affect. Extremities: No clubbing or cyanosis.   ECG: reviewed and available in electronic records.      ASSESSMENT AND PLAN: 1. Tachycardia: there do not appear to be any readily apparent physiologic causes of her tachycardia. I will start Toprol-XL 25 mg daily for what appears to be an inappropriate sinus tachycardia.  2. HTN: it remains uncontrolled today, and I will continue to monitor this. If it remains elevated at her next visit after starting Toprol-XL, I will increase the dose of lisinopril.   Dispo: f/u 3 weeks.    Kate Sable, M.D., F.A.C.C.

## 2013-02-13 ENCOUNTER — Other Ambulatory Visit: Payer: Self-pay | Admitting: *Deleted

## 2013-02-13 DIAGNOSIS — R000 Tachycardia, unspecified: Secondary | ICD-10-CM

## 2013-03-02 ENCOUNTER — Encounter: Payer: Self-pay | Admitting: Cardiovascular Disease

## 2013-03-02 ENCOUNTER — Ambulatory Visit (INDEPENDENT_AMBULATORY_CARE_PROVIDER_SITE_OTHER): Payer: Medicare Other | Admitting: Cardiovascular Disease

## 2013-03-02 VITALS — BP 155/72 | HR 93 | Ht 62.0 in | Wt 137.2 lb

## 2013-03-02 DIAGNOSIS — R000 Tachycardia, unspecified: Secondary | ICD-10-CM

## 2013-03-02 DIAGNOSIS — I10 Essential (primary) hypertension: Secondary | ICD-10-CM

## 2013-03-02 MED ORDER — LISINOPRIL 10 MG PO TABS: 20.0000 mg | ORAL_TABLET | Freq: Every day | ORAL | Status: DC

## 2013-03-02 MED ORDER — LISINOPRIL 20 MG PO TABS: 20.0000 mg | ORAL_TABLET | Freq: Every day | ORAL | Status: DC

## 2013-03-02 MED ORDER — METOPROLOL SUCCINATE ER 25 MG PO TB24: 50.0000 mg | ORAL_TABLET | Freq: Every day | ORAL | Status: DC

## 2013-03-02 MED ORDER — METOPROLOL SUCCINATE ER 50 MG PO TB24: 50.0000 mg | ORAL_TABLET | Freq: Two times a day (BID) | ORAL | Status: DC

## 2013-03-02 NOTE — Patient Instructions (Addendum)
Your physician recommends that you schedule a follow-up appointment in: 4 months   Your physician has recommended you make the following change in your medication:    Increase Toprol  XL to 50 mg daily  Increase Lisinopril to 20 mg daily     Thanks for choosing [facility] !

## 2013-03-02 NOTE — Progress Notes (Signed)
Patient ID: [name], female   DOB: 12-21-1933, 80 y.o.   MRN: [id number]      SUBJECTIVE: The patient is here to follow up for inappropriate sinus tachycardia and hypertension. She is feeling well and denies chest pain, palpitations, shortness of breath and leg swelling. She has chronic positional vertigo which is unchanged.    Allergies  Allergen Reactions  . Iodinated Diagnostic Agents Anaphylaxis    08-12-10 Pt had severe reaction with itching/hives/loss of consciousness within 5 minutes of CEPI today.  She received both Kenalog and Iondinated Contrast, so not certain which agent caused reaction.  If patient returns for another CEPI, Dr. Jobe Igo considers using Decadron and a 13-hour prep.  jkl  . Nerve Block Tray Anaphylaxis    Patient states that she was sent to have Epidural for her shoulder. After rec'ving she remembers saying that she was itching all over  And that's it. She was transported to hospital  By EMS. Stayed overnight.  Bufford Spikes Derivatives Hives and Nausea And Vomiting  . Triamcinolone Acetonide Anaphylaxis    08-12-10  Pt had severe reaction with itching/hives/loss of consciousness within five minutes of CEPI today.  She received both Kenalog and Iodinated Contrast, so not certain which agent caused reaction.  If pt returns for another cervical epidural steroid injection, Dr. Jobe Igo suggests using Decadron and a 13-hr prep.  JKL    Current Outpatient Prescriptions  Medication Sig Dispense Refill  . ergocalciferol (VITAMIN D2) 50000 UNITS capsule Take 50,000 Units by mouth once a week.        . fluticasone (FLONASE) 50 MCG/ACT nasal spray Place 1 spray into both nostrils daily.      Marland Kitchen HYDROcodone-acetaminophen (VICODIN) 5-500 MG per tablet Take 1 tablet by mouth at bedtime as needed for pain.       . hydrocortisone (PROCTOZONE-HC) 2.5 % rectal cream Place rectally 2 (two) times daily.  30 g  2  . leflunomide (ARAVA) 20 MG tablet Take 20 mg by mouth daily.        Marland Kitchen lisinopril (PRINIVIL,ZESTRIL) 10 MG tablet Take 10 mg by mouth daily.        . metoprolol succinate (TOPROL XL) 25 MG 24 hr tablet Take 1 tablet (25 mg total) by mouth daily.  60 tablet  3  . omeprazole (PRILOSEC) 20 MG capsule Take 20 mg by mouth 2 (two) times daily before a meal.      . Probiotic Product (PROBIOTIC FORMULA) CAPS Take 1 capsule by mouth every other day.        No current facility-administered medications for this visit.    Past Medical History  Diagnosis Date  . Hypertension   . Colon cancer   . RUQ pain   . Rheumatoid arthritis(714.0)   . Cervicalgia   . Degeneration of cervical intervertebral disc   . Pinched nerve in shoulder   . Yeast infection   . Vulvar dystrophy     Past Surgical History  Procedure Laterality Date  . Colonoscopy  04/02/2009  . Colonoscopy  06/04/05  . Hemicolectomy  10/05  . Cholecystectomy  2002  . Appendectomy  with hysterectomy  . Abdominal hysterectomy  1960'S  . Back surgery  12/08/10    plate/screws C3 C4  . Colonoscopy with esophagogastroduodenoscopy (egd) N/A 04/08/2012    Procedure: COLONOSCOPY WITH ESOPHAGOGASTRODUODENOSCOPY (EGD);  Surgeon: Rogene Houston, MD;  Location: [facility];  Service: Endoscopy;  Laterality: N/A;  130-moved to [address] notified pt  History   Social History  . Marital Status: Married    Spouse Name: N/A    Number of Children: N/A  . Years of Education: N/A   Occupational History  . Not on file.   Social History Main Topics  . Smoking status: Never Smoker   . Smokeless tobacco: Never Used  . Alcohol Use: No  . Drug Use: No  . Sexual Activity: No   Other Topics Concern  . Not on file   Social History Narrative  . No narrative on file     Filed Vitals:   03/02/13 1051  BP: 155/72  Pulse: 93  Height: 5\' 2"  (1.575 m)  Weight: 137 lb 4 oz (62.256 kg)    PHYSICAL EXAM General: NAD Neck: No JVD, no thyromegaly or thyroid nodule.  Lungs: Clear to auscultation bilaterally  with normal respiratory effort. CV: Nondisplaced PMI.  Heart regular S1/S2, no S3/S4, no murmur.  No peripheral edema.  No carotid bruit.  Normal pedal pulses.  Abdomen: Soft, nontender, no hepatosplenomegaly, no distention.  Neurologic: Alert and oriented x 3.  Psych: Normal affect. Extremities: No clubbing or cyanosis.   ECG: reviewed and available in electronic records.     ASSESSMENT AND PLAN: 1. Tachycardia: She has what appears to be an inappropriate sinus tachycardia, which is better controlled on Toprol-XL 25 mg daily. However, her resting heart rate is 93 beats per minute. I will increase Toprol-XL to 50 mg daily so that she does not have an inappropriate elevation of her heart rate with exertion. 2. HTN: it continues to remain uncontrolled today. I will increase lisinopril to 20 mg daily.   Dispo: f/u 4 months.    Kate Sable, M.D., F.A.C.C.

## 2013-03-21 ENCOUNTER — Telehealth: Payer: Self-pay | Admitting: Adult Health

## 2013-03-21 NOTE — Telephone Encounter (Signed)
Confirmed lisinopril increased to 20 mg daily and Toprol XL increased to 50 mg daily

## 2013-03-21 NOTE — Telephone Encounter (Signed)
Please see paper in refill bin / tgs  °

## 2013-03-22 ENCOUNTER — Encounter (HOSPITAL_COMMUNITY): Payer: Medicare Other | Attending: Hematology and Oncology

## 2013-03-22 DIAGNOSIS — Z9049 Acquired absence of other specified parts of digestive tract: Secondary | ICD-10-CM | POA: Insufficient documentation

## 2013-03-22 DIAGNOSIS — M5030 Other cervical disc degeneration, unspecified cervical region: Secondary | ICD-10-CM | POA: Insufficient documentation

## 2013-03-22 DIAGNOSIS — I10 Essential (primary) hypertension: Secondary | ICD-10-CM | POA: Insufficient documentation

## 2013-03-22 DIAGNOSIS — Z85038 Personal history of other malignant neoplasm of large intestine: Secondary | ICD-10-CM | POA: Insufficient documentation

## 2013-03-22 DIAGNOSIS — Z09 Encounter for follow-up examination after completed treatment for conditions other than malignant neoplasm: Secondary | ICD-10-CM | POA: Insufficient documentation

## 2013-03-22 LAB — CBC WITH DIFFERENTIAL/PLATELET
BASOS ABS: 0 10*3/uL (ref 0.0–0.1)
BASOS PCT: 1 % (ref 0–1)
Eosinophils Absolute: 0.3 10*3/uL (ref 0.0–0.7)
Eosinophils Relative: 6 % — ABNORMAL HIGH (ref 0–5)
HCT: 37.9 % (ref 36.0–46.0)
Hemoglobin: 12.6 g/dL (ref 12.0–15.0)
LYMPHS PCT: 20 % (ref 12–46)
Lymphs Abs: 1.1 10*3/uL (ref 0.7–4.0)
MCH: 29.8 pg (ref 26.0–34.0)
MCHC: 33.2 g/dL (ref 30.0–36.0)
MCV: 89.6 fL (ref 78.0–100.0)
MONO ABS: 0.8 10*3/uL (ref 0.1–1.0)
Monocytes Relative: 14 % — ABNORMAL HIGH (ref 3–12)
NEUTROS ABS: 3.4 10*3/uL (ref 1.7–7.7)
NEUTROS PCT: 60 % (ref 43–77)
PLATELETS: 174 10*3/uL (ref 150–400)
RBC: 4.23 MIL/uL (ref 3.87–5.11)
RDW: 14.5 % (ref 11.5–15.5)
WBC: 5.6 10*3/uL (ref 4.0–10.5)

## 2013-03-22 LAB — COMPREHENSIVE METABOLIC PANEL
ALBUMIN: 3.5 g/dL (ref 3.5–5.2)
ALT: 14 U/L (ref 0–35)
AST: 21 U/L (ref 0–37)
Alkaline Phosphatase: 66 U/L (ref 39–117)
BILIRUBIN TOTAL: 0.4 mg/dL (ref 0.3–1.2)
BUN: 10 mg/dL (ref 6–23)
CHLORIDE: 102 meq/L (ref 96–112)
CO2: 29 meq/L (ref 19–32)
CREATININE: 0.71 mg/dL (ref 0.50–1.10)
Calcium: 8.9 mg/dL (ref 8.4–10.5)
GFR calc Af Amer: >90 mL/min (ref >90)
GFR calc non Af Amer: 81 mL/min — ABNORMAL LOW (ref >90)
Glucose, Bld: 103 mg/dL — ABNORMAL HIGH (ref 70–99)
POTASSIUM: 3.8 meq/L (ref 3.7–5.3)
SODIUM: 140 meq/L (ref 137–147)
Total Protein: 6.9 g/dL (ref 6.0–8.3)

## 2013-03-22 NOTE — Progress Notes (Signed)
Labs drawn today for cbc/diff,cmp,cea 

## 2013-03-23 LAB — CEA: CEA: 1 ng/mL (ref 0.0–5.0)

## 2013-03-24 ENCOUNTER — Encounter (HOSPITAL_COMMUNITY): Payer: Self-pay

## 2013-03-24 ENCOUNTER — Encounter (HOSPITAL_BASED_OUTPATIENT_CLINIC_OR_DEPARTMENT_OTHER): Payer: Medicare Other

## 2013-03-24 VITALS — BP 171/70 | HR 77 | Temp 97.9°F | Resp 18 | Wt 138.9 lb

## 2013-03-24 DIAGNOSIS — K219 Gastro-esophageal reflux disease without esophagitis: Secondary | ICD-10-CM

## 2013-03-24 DIAGNOSIS — Z85038 Personal history of other malignant neoplasm of large intestine: Secondary | ICD-10-CM

## 2013-03-24 NOTE — Patient Instructions (Signed)
[facility] Discharge Instructions  RECOMMENDATIONS MADE BY THE CONSULTANT AND ANY TEST RESULTS WILL BE SENT TO YOUR REFERRING PHYSICIAN.  Call the clinic for any further problems or concerns.    Thank you for choosing [facility] to provide your oncology and hematology care.  To afford each patient quality time with our providers, please arrive at least 15 minutes before your scheduled appointment time.  With your help, our goal is to use those 15 minutes to complete the necessary work-up to ensure our physicians have the information they need to help with your evaluation and healthcare recommendations.    Effective January 1st, 2014, we ask that you re-schedule your appointment with our physicians should you arrive 10 or more minutes late for your appointment.  We strive to give you quality time with our providers, and arriving late affects you and other patients whose appointments are after yours.    Again, thank you for choosing [facility]  Our hope is that these requests will decrease the amount of time that you wait before being seen by our physicians.       _____________________________________________________________  Should you have questions after your visit to [facility], please contact our office at [phone] between the hours of 8:30 a.m. and 5:00 p.m.  Voicemails left after 4:30 p.m. will not be returned until the following business day.  For prescription refill requests, have your pharmacy contact our office with your prescription refill request.

## 2013-03-24 NOTE — Progress Notes (Signed)
[facility]  OFFICE PROGRESS NOTE  Glo Herring., MD [address] [place] [address]  DIAGNOSIS: History of colon cancer  Chief Complaint  Patient presents with  . Colon Cancer    CURRENT THERAPY: Watchful expectation.  INTERVAL HISTORY: REEGAN MCTIGHE 80 y.o. female returns for followup of stage II carcinoma of the ascending colon, status post right hemicolectomy on 10/17/2003 with no postop therapy. Last colonoscopy in March of 2014 was unremarkable. She continues to do well excellent appetite except for occasional reflux symptoms in the middle of the night he she's prior to going to bed. She does have blocks for the upper portion of her bed. She denies any fever, night sweats, cough, wheezing, diarrhea, constipation, melena, hematochezia, hematuria, abdominal distention, fever, night sweats, lower extremity swelling or redness, skin rash, cough, wheezing, headache, or seizures.   MEDICAL HISTORY: Past Medical History  Diagnosis Date  . Hypertension   . Colon cancer   . RUQ pain   . Rheumatoid arthritis(714.0)   . Cervicalgia   . Degeneration of cervical intervertebral disc   . Pinched nerve in shoulder   . Yeast infection   . Vulvar dystrophy     INTERIM HISTORY: has HYPERLIPIDEMIA; BRONCHIECTASIS; RHEUMATOID ARTHRITIS; History of colon cancer; Diarrhea; Dyspepsia; Abdominal pain, epigastric; Dystrophy, vulva; and Tachycardia on her problem list.   stage II adenocarcinoma of colon (T3, N0) right-sided, grade 2, status post right hemicolectomy on 10/17/2003 for a 5.0 cm cancer the cecum with 13 negative nodes. She was offered inclusion in a protocol at that time but declined. She did not have LV I. Her CEAs have remained normal and she has remained disease-free since.  ALLERGIES:  is allergic to iodinated diagnostic agents; nerve block tray; nitrofuran derivatives; and triamcinolone  acetonide.  MEDICATIONS: has a current medication list which includes the following prescription(s): ergocalciferol, fluticasone, hydrocodone-acetaminophen, leflunomide, lisinopril, metoprolol succinate, omeprazole, hydrocortisone, and probiotic formula.  SURGICAL HISTORY:  Past Surgical History  Procedure Laterality Date  . Colonoscopy  04/02/2009  . Colonoscopy  06/04/05  . Hemicolectomy  10/05  . Cholecystectomy  2002  . Appendectomy  with hysterectomy  . Abdominal hysterectomy  1960'S  . Back surgery  12/08/10    plate/screws C3 C4  . Colonoscopy with esophagogastroduodenoscopy (egd) N/A 04/08/2012    Procedure: COLONOSCOPY WITH ESOPHAGOGASTRODUODENOSCOPY (EGD);  Surgeon: Rogene Houston, MD;  Location: [facility];  Service: Endoscopy;  Laterality: N/A;  130-moved to 29 Ann notified pt    FAMILY HISTORY: family history includes Healthy in her brother, daughter, son, and son; Hiatal hernia in her sister; Parkinsonism in her father; Rheum arthritis in her mother.  SOCIAL HISTORY:  reports that she has never smoked. She has never used smokeless tobacco. She reports that she does not drink alcohol or use illicit drugs.  REVIEW OF SYSTEMS:  Other than that discussed above is noncontributory.  PHYSICAL EXAMINATION: ECOG PERFORMANCE STATUS: 0 - Asymptomatic  Blood pressure 171/70, pulse 77, temperature 97.9 F (36.6 C), temperature source Oral, resp. rate 18, weight 138 lb 14.4 oz (63.005 kg), SpO2 94.00%.  GENERAL:alert, no distress and comfortable SKIN: skin color, texture, turgor are normal, no rashes or significant lesions EYES: PERLA; Conjunctiva are pink and non-injected, sclera clear OROPHARYNX:no exudate, no erythema on lips, buccal mucosa, or tongue. NECK: supple, thyroid normal size, non-tender, without nodularity. No masses CHEST: Normal AP diameter with no breast masses. LYMPH:  no  palpable lymphadenopathy in the cervical, axillary or inguinal LUNGS: clear to  auscultation and percussion with normal breathing effort HEART: regular rate & rhythm and no murmurs. ABDOMEN:abdomen soft, non-tender and normal bowel sounds. No evidence of hepatosplenomegaly, ascites, or CVA tenderness. MUSCULOSKELETAL:no cyanosis of digits and no clubbing. Range of motion normal.  NEURO: alert & oriented x 3 with fluent speech, no focal motor/sensory deficits   LABORATORY DATA: Infusion on 03/22/2013  Component Date Value Ref Range Status  . WBC 03/22/2013 5.6  4.0 - 10.5 K/uL Final  . RBC 03/22/2013 4.23  3.87 - 5.11 MIL/uL Final  . Hemoglobin 03/22/2013 12.6  12.0 - 15.0 g/dL Final  . HCT 03/22/2013 37.9  36.0 - 46.0 % Final  . MCV 03/22/2013 89.6  78.0 - 100.0 fL Final  . MCH 03/22/2013 29.8  26.0 - 34.0 pg Final  . MCHC 03/22/2013 33.2  30.0 - 36.0 g/dL Final  . RDW 03/22/2013 14.5  11.5 - 15.5 % Final  . Platelets 03/22/2013 174  150 - 400 K/uL Final  . Neutrophils Relative % 03/22/2013 60  43 - 77 % Final  . Neutro Abs 03/22/2013 3.4  1.7 - 7.7 K/uL Final  . Lymphocytes Relative 03/22/2013 20  12 - 46 % Final  . Lymphs Abs 03/22/2013 1.1  0.7 - 4.0 K/uL Final  . Monocytes Relative 03/22/2013 14* 3 - 12 % Final  . Monocytes Absolute 03/22/2013 0.8  0.1 - 1.0 K/uL Final  . Eosinophils Relative 03/22/2013 6* 0 - 5 % Final  . Eosinophils Absolute 03/22/2013 0.3  0.0 - 0.7 K/uL Final  . Basophils Relative 03/22/2013 1  0 - 1 % Final  . Basophils Absolute 03/22/2013 0.0  0.0 - 0.1 K/uL Final  . Sodium 03/22/2013 140  137 - 147 mEq/L Final  . Potassium 03/22/2013 3.8  3.7 - 5.3 mEq/L Final  . Chloride 03/22/2013 102  96 - 112 mEq/L Final  . CO2 03/22/2013 29  19 - 32 mEq/L Final  . Glucose, Bld 03/22/2013 103* 70 - 99 mg/dL Final  . BUN 03/22/2013 10  6 - 23 mg/dL Final  . Creatinine, Ser 03/22/2013 0.71  0.50 - 1.10 mg/dL Final  . Calcium 03/22/2013 8.9  8.4 - 10.5 mg/dL Final  . Total Protein 03/22/2013 6.9  6.0 - 8.3 g/dL Final  . Albumin 03/22/2013 3.5   3.5 - 5.2 g/dL Final  . AST 03/22/2013 21  0 - 37 U/L Final  . ALT 03/22/2013 14  0 - 35 U/L Final  . Alkaline Phosphatase 03/22/2013 66  39 - 117 U/L Final  . Total Bilirubin 03/22/2013 0.4  0.3 - 1.2 mg/dL Final  . GFR calc non Af Amer 03/22/2013 81* >90 mL/min Final  . GFR calc Af Amer 03/22/2013 >90  >90 mL/min Final   Comment: (NOTE)                          The eGFR has been calculated using the CKD EPI equation.                          This calculation has not been validated in all clinical situations.                          eGFR's persistently <90 mL/min signify possible Chronic Kidney  Disease.  . CEA 03/22/2013 1.0  0.0 - 5.0 ng/mL Final   Performed at [facility]: No new pathology. Upper and lower endoscopy done in March of 2014 unremarkable except for mild reflux esophagitis. Urinalysis    Component Value Date/Time   NITRITE neg 05/04/2012 0856   LEUKOCYTESUR small (1+) 05/04/2012 0856    RADIOGRAPHIC STUDIES: MM Digital Screening Status: Final result            Study Result    CLINICAL DATA: Screening.  EXAM:  DIGITAL SCREENING BILATERAL MAMMOGRAM WITH CAD  COMPARISON: Previous exam(s).  ACR Breast Density Category b: There are scattered areas of  fibroglandular density.  FINDINGS:  There are no findings suspicious for malignancy. Images were  processed with CAD.  IMPRESSION:  No mammographic evidence of malignancy. A result letter of this  screening mammogram will be mailed directly to the patient.  RECOMMENDATION:  Screening mammogram in one year. (Code:SM-B-01Y)  BI-RADS CATEGORY 1: Negative  Electronically Signed  By: Luberta Robertson M.D.  On: 01/10/2013 12   .  ASSESSMENT:  #1. Stage II (T3 N0) adenocarcinoma of the descending colon, grade 2, status post right hemicolectomy on 10/17/2003 for a 5 cm cancer in the cecum with 13 lymph nodes negative, no evidence of disease. #2 bronchiectasis since  2008  #3 rheumatoid arthritis  #4 hyperlipidemia  #5 appendectomy at the time of a hysterectomy for a pre-malignant process of the cervix.  #6 hysterectomy in the past  #7 cholecystectomy  #8 right rotator cuff damage at the time of trauma at age 80 required right hand surgery x3 as well .   PLAN:  #1. Patient was advised to elevate the head of the bed with the blocks she  has at home. She was also advised to continue using antacids during the night  should she have breakthrough reflux symptoms. #2. Patient was discharged from this clinic but was told to call should she have any new problems occur persistent and bothersome.   All questions were answered. The patient knows to call the clinic with any problems, questions or concerns. We can certainly see the patient much sooner if necessary.   I spent 25 minutes counseling the patient face to face. The total time spent in the appointment was 30 minutes.    Doroteo Bradford, MD 03/24/2013 11:07 AM

## 2013-03-27 ENCOUNTER — Other Ambulatory Visit: Payer: Self-pay | Admitting: Obstetrics and Gynecology

## 2013-03-29 NOTE — Telephone Encounter (Signed)
Pt reports allergy to corticosteroid meds, will clarify with pt before refilling meds.

## 2013-03-30 ENCOUNTER — Other Ambulatory Visit: Payer: Self-pay | Admitting: Obstetrics and Gynecology

## 2013-03-30 DIAGNOSIS — N904 Leukoplakia of vulva: Secondary | ICD-10-CM

## 2013-03-30 MED ORDER — HYDROCORTISONE 2.5 % RE CREA: TOPICAL_CREAM | Freq: Two times a day (BID) | RECTAL | Status: DC

## 2013-07-03 ENCOUNTER — Ambulatory Visit: Payer: Medicare Other | Admitting: Cardiovascular Disease

## 2013-07-04 ENCOUNTER — Ambulatory Visit (INDEPENDENT_AMBULATORY_CARE_PROVIDER_SITE_OTHER): Payer: Medicare Other | Admitting: Adult Health

## 2013-07-04 ENCOUNTER — Encounter: Payer: Self-pay | Admitting: Adult Health

## 2013-07-04 VITALS — BP 182/62 | HR 65 | Ht 62.0 in | Wt 139.0 lb

## 2013-07-04 DIAGNOSIS — Z1329 Encounter for screening for other suspected endocrine disorder: Secondary | ICD-10-CM

## 2013-07-04 DIAGNOSIS — I10 Essential (primary) hypertension: Secondary | ICD-10-CM | POA: Insufficient documentation

## 2013-07-04 DIAGNOSIS — R000 Tachycardia, unspecified: Secondary | ICD-10-CM

## 2013-07-04 MED ORDER — AMLODIPINE BESYLATE 5 MG PO TABS: 5.0000 mg | ORAL_TABLET | Freq: Every day | ORAL | Status: DC

## 2013-07-04 NOTE — Progress Notes (Signed)
HPI: [name] is a 80 year old patient of Dr. Pennelope Bracken were following for ongoing assessment and management of tachycardia and hypertension. She was seen by Dr. Pennelope Bracken in February 2015 was continued on metoprolol but had increased dose to 50 mg daily from 25 mg daily.   Blood pressure was not well-controlled during her evaluation in the office at 155/72, Lisinopril was increased to 20 mg daily. She is here for evaluation of heart rate and blood pressure.  She comes today with complaints of recurrent tachycardia, which she has documented on 4 separate occasions. She states once she was walking onto [address] and her heart rate (ACE faster than normal. It was checked at 180 beats per minute. She had another episode while she was bathing and bending over to wash her hair and her heart rate increase rapidly and went away with lying down. She also had an episode while she was lifting 2 L bottles of cola from the grocery store, with heart rate going up lasting approximately one hour. Before the episode was this past Sunday when she had been working in the kitchen preparing a meal, lasting for approximately one hour and going away with lying down.  The patient's blood pressure is elevated today as well. She has been taking her medications as directed and is uncertain why he remains elevated. She is very talkative and continues to have pressure in her chest with multiple symptoms associated.    Allergies  Allergen Reactions  . Iodinated Diagnostic Agents Anaphylaxis    08-12-10 Pt had severe reaction with itching/hives/loss of consciousness within 5 minutes of CEPI today.  She received both Kenalog and Iondinated Contrast, so not certain which agent caused reaction.  If patient returns for another CEPI, Dr. Jobe Igo considers using Decadron and a 13-hour prep.  jkl  . Nerve Block Tray Anaphylaxis    Patient states that she was sent to have Epidural for her shoulder. After rec'ving she remembers saying  that she was itching all over  And that's it. She was transported to hospital  By EMS. Stayed overnight.  [place] Spikes Derivatives Hives and Nausea And Vomiting  . Triamcinolone Acetonide Anaphylaxis    08-12-10  Pt had severe reaction with itching/hives/loss of consciousness within five minutes of CEPI today.  She received both Kenalog and Iodinated Contrast, so not certain which agent caused reaction.  If pt returns for another cervical epidural steroid injection, Dr. Jobe Igo suggests using Decadron and a 13-hr prep.  JKL PATIENT USES PROCTOCREAM 2.5% WITHOUT DIFFICULTY FOR VULVAR DYSTROPHY    Current Outpatient Prescriptions  Medication Sig Dispense Refill  . ergocalciferol (VITAMIN D2) 50000 UNITS capsule Take 50,000 Units by mouth once a week.        . fluticasone (FLONASE) 50 MCG/ACT nasal spray Place 1 spray into both nostrils daily.      Marland Kitchen HYDROcodone-acetaminophen (VICODIN) 5-500 MG per tablet Take 1 tablet by mouth at bedtime as needed for pain.       . hydrocortisone (PROCTOZONE-HC) 2.5 % rectal cream Place rectally 2 (two) times daily.  30 g  2  . leflunomide (ARAVA) 20 MG tablet Take 20 mg by mouth daily.      Marland Kitchen lisinopril (PRINIVIL,ZESTRIL) 20 MG tablet Take 1 tablet (20 mg total) by mouth daily.  30 tablet  3  . metoprolol succinate (TOPROL XL) 50 MG 24 hr tablet Take 1 tablet (50 mg total) by mouth 2 (two) times daily.  60 tablet  3  . omeprazole (PRILOSEC)  20 MG capsule Take 20 mg by mouth 2 (two) times daily before a meal.      . Probiotic Product (PROBIOTIC FORMULA) CAPS Take 1 capsule by mouth every other day.       Marland Kitchen amLODipine (NORVASC) 5 MG tablet Take 1 tablet (5 mg total) by mouth daily.  180 tablet  3   No current facility-administered medications for this visit.    Past Medical History  Diagnosis Date  . Hypertension   . Colon cancer   . RUQ pain   . Rheumatoid arthritis(714.0)   . Cervicalgia   . Degeneration of cervical intervertebral disc   . Pinched nerve  in shoulder   . Yeast infection   . Vulvar dystrophy     Past Surgical History  Procedure Laterality Date  . Colonoscopy  04/02/2009  . Colonoscopy  06/04/05  . Hemicolectomy  10/05  . Cholecystectomy  2002  . Appendectomy  with hysterectomy  . Abdominal hysterectomy  1960'S  . Back surgery  12/08/10    plate/screws C3 C4  . Colonoscopy with esophagogastroduodenoscopy (egd) N/A 04/08/2012    Procedure: COLONOSCOPY WITH ESOPHAGOGASTRODUODENOSCOPY (EGD);  Surgeon: Rogene Houston, MD;  Location: [facility];  Service: Endoscopy;  Laterality: N/A;  130-moved to 245 Ann notified pt    ROS: Review of systems complete and found to be negative unless listed above  PHYSICAL EXAM BP 182/62  Pulse 65  Ht 5\' 2"  (1.575 m)  Wt 139 lb (63.05 kg)  BMI 25.42 kg/m2  SpO2 95% General: Well developed, well nourished, in no acute distress Head: Eyes PERRLA, No xanthomas.   Normal cephalic and atramatic  Lungs: Clear bilaterally to auscultation and percussion. Heart: HRRR S1 S2, S4 noted without MRG.  Pulses are 2+ & equal.            No carotid bruit. No JVD.  No abdominal bruits. No femoral bruits. Abdomen: Bowel sounds are positive, abdomen soft and non-tender without masses or                  Hernia's noted. Msk:  Back normal, normal gait. Normal strength and tone for age. Extremities: No clubbing, cyanosis or edema.  DP +1 Neuro: Alert and oriented X 3. Psych:  Good affect, responds appropriately   ASSESSMENT AND PLAN

## 2013-07-04 NOTE — Assessment & Plan Note (Signed)
Blood pressure was elevated on last office visit at 171/70 at which time lisinopril was increased to 20 mg daily. Blood pressure is elevated again today in the office. I rechecked it manually and found to be accurate with triage recording. I will begin her on amlodipine 5 mg daily. She is advised to get a blood pressure cuff, and take it at the same time every day and record. I provided a pressure recording sheet, which she is to document her blood pressure. She will return in the office in next week for a nurse visit and blood pressure check on new medications. She is to bring with her her record of blood pressures at home.

## 2013-07-04 NOTE — Patient Instructions (Addendum)
Your physician recommends that you schedule a follow-up appointment in: 5-6 weeks with Ms.Lawrence NP   START Amlodipine 5 mg daily for blood pressure  PLEASE buy a blood pressure monitoring machine and retuen in 1 week for nurse visit for blood pressure check   Please get blood work today (TSH,BMET)    Your physician has recommended that you wear an event monitor for 30 days. Event monitors are medical devices that record the heart's electrical activity. Doctors most often [place] these monitors to diagnose arrhythmias. Arrhythmias are problems with the speed or rhythm of the heartbeat. The monitor is a small, portable device. You can wear one while you do your normal daily activities. This is usually used to diagnose what is causing palpitations/syncope (passing out).

## 2013-07-04 NOTE — Assessment & Plan Note (Signed)
She continues to have episodes of tachycardia to which she is very sensitive causing her to feel pressure in her chest, usually lasting about an hour sometimes longer. She has had 4 episodes over the last 3 months which is documented. She has had a 24-hour Holter monitor placed but not recording a rapid heart rhythm. She remains medically compliant.  We will place a 30 day cardiac monitor on this patient. The patient is having more frequent episodes of tachycardia. She states that she has a sister who had the same episodes and eventually was seen by electrophysiology, and required ablation. This may be something we need to address should be have some documented tachycardia or other arrhythmias. For now make any changes on her beta blocker as he here in the office it is 65 beats per minute and do not wish to cause significant bradycardia when she is not having these episodes.  A TSH was drawn in January of 2015 and 0.465. I will repeat this along with the BMET to evaluate for dehydration or abnormal levels. She will return to the office and by weeks to discuss cardiac monitor results. She is advised that if any tachycardic arrhythmias occur she will be notified by the monitoring company and we will be notified as well. She verbalizes understanding

## 2013-07-04 NOTE — Progress Notes (Deleted)
Name: [name]    DOB: [birth year]  Age: 80 y.o.  MR#: [id number]       PCP:  Glo Herring., MD      Insurance: Payor: [employer] MEDICARE / Plan: AARP MEDICARE COMPLETE / Product Type: *No Product type* /   CC:    Chief Complaint  Patient presents with  . Hypertension  . Tachycardia    VS Filed Vitals:   07/04/13 1510  BP: 182/62  Pulse: 65  Height: 5\' 2"  (1.575 m)  Weight: 139 lb (63.05 kg)  SpO2: 95%    Weights Current Weight  07/04/13 139 lb (63.05 kg)  03/24/13 138 lb 14.4 oz (63.005 kg)  03/02/13 137 lb 4 oz (62.256 kg)    Blood Pressure  BP Readings from Last 3 Encounters:  07/04/13 182/62  03/24/13 171/70  03/02/13 155/72     Admit date:  (Not on file) Last encounter with RMR:  03/21/2013   Allergy Iodinated diagnostic agents; Nerve block tray; Nitrofuran derivatives; and Triamcinolone acetonide  Current Outpatient Prescriptions  Medication Sig Dispense Refill  . ergocalciferol (VITAMIN D2) 50000 UNITS capsule Take 50,000 Units by mouth once a week.        . fluticasone (FLONASE) 50 MCG/ACT nasal spray Place 1 spray into both nostrils daily.      Marland Kitchen HYDROcodone-acetaminophen (VICODIN) 5-500 MG per tablet Take 1 tablet by mouth at bedtime as needed for pain.       . hydrocortisone (PROCTOZONE-HC) 2.5 % rectal cream Place rectally 2 (two) times daily.  30 g  2  . leflunomide (ARAVA) 20 MG tablet Take 20 mg by mouth daily.      Marland Kitchen lisinopril (PRINIVIL,ZESTRIL) 20 MG tablet Take 1 tablet (20 mg total) by mouth daily.  30 tablet  3  . metoprolol succinate (TOPROL XL) 50 MG 24 hr tablet Take 1 tablet (50 mg total) by mouth 2 (two) times daily.  60 tablet  3  . omeprazole (PRILOSEC) 20 MG capsule Take 20 mg by mouth 2 (two) times daily before a meal.      . Probiotic Product (PROBIOTIC FORMULA) CAPS Take 1 capsule by mouth every other day.        No current facility-administered medications for this visit.    Discontinued Meds:   There are no  discontinued medications.  Patient Active Problem List   Diagnosis Date Noted  . Tachycardia 01/31/2013  . Dystrophy, vulva 05/04/2012  . Abdominal pain, epigastric 03/28/2012  . Dyspepsia 04/14/2011  . History of colon cancer 03/17/2011  . Diarrhea 03/17/2011  . HYPERLIPIDEMIA 11/30/2006  . BRONCHIECTASIS 11/30/2006  . RHEUMATOID ARTHRITIS 11/30/2006    LABS    Component Value Date/Time   NA 140 03/22/2013 1035   NA 142 03/21/2012 0900   NA 140 02/25/2011 1325   K 3.8 03/22/2013 1035   K 3.4* 03/21/2012 0900   K 3.8 02/25/2011 1325   CL 102 03/22/2013 1035   CL 101 03/21/2012 0900   CL 102 02/25/2011 1325   CO2 29 03/22/2013 1035   CO2 30 03/21/2012 0900   CO2 26 02/25/2011 1325   GLUCOSE 103* 03/22/2013 1035   GLUCOSE 100* 03/21/2012 0900   GLUCOSE 89 02/25/2011 1325   BUN 10 03/22/2013 1035   BUN 11 03/21/2012 0900   BUN 8 02/25/2011 1325   CREATININE 0.71 03/22/2013 1035   CREATININE 0.72 03/21/2012 0900   CREATININE 0.53 02/25/2011 1325   CALCIUM 8.9 03/22/2013 1035   CALCIUM 9.7  03/21/2012 0900   CALCIUM 9.9 02/25/2011 1325   GFRNONAA 81* 03/22/2013 1035   GFRNONAA 81* 03/21/2012 0900   GFRNONAA >90 02/25/2011 1325   GFRAA >90 03/22/2013 1035   GFRAA >90 03/21/2012 0900   GFRAA >90 02/25/2011 1325   CMP     Component Value Date/Time   NA 140 03/22/2013 1035   K 3.8 03/22/2013 1035   CL 102 03/22/2013 1035   CO2 29 03/22/2013 1035   GLUCOSE 103* 03/22/2013 1035   BUN 10 03/22/2013 1035   CREATININE 0.71 03/22/2013 1035   CALCIUM 8.9 03/22/2013 1035   PROT 6.9 03/22/2013 1035   ALBUMIN 3.5 03/22/2013 1035   AST 21 03/22/2013 1035   ALT 14 03/22/2013 1035   ALKPHOS 66 03/22/2013 1035   BILITOT 0.4 03/22/2013 1035   GFRNONAA 81* 03/22/2013 1035   GFRAA >90 03/22/2013 1035       Component Value Date/Time   WBC 5.6 03/22/2013 1035   WBC 5.3 02/01/2013 1010   WBC 4.8 03/21/2012 0900   HGB 12.6 03/22/2013 1035   HGB 13.8 02/01/2013 1010   HGB 13.3 03/21/2012 0900   HCT 37.9 03/22/2013 1035    HCT 41.1 02/01/2013 1010   HCT 39.7 03/21/2012 0900   MCV 89.6 03/22/2013 1035   MCV 89.2 02/01/2013 1010   MCV 88.8 03/21/2012 0900    Lipid Panel     Component Value Date/Time   CHOL  Value: 209        ATP III CLASSIFICATION:  <200     mg/dL   Desirable  200-239  mg/dL   Borderline High  >=240    mg/dL   High       * 08/25/2008 0440   TRIG 126 08/25/2008 0440   HDL 44 08/25/2008 0440   CHOLHDL 4.8 08/25/2008 0440   VLDL 25 08/25/2008 0440   LDLCALC  Value: 140        Total Cholesterol/HDL:CHD Risk Coronary Heart Disease Risk Table                     Men   Women  1/2 Average Risk   3.4   3.3  Average Risk       5.0   4.4  2 X Average Risk   9.6   7.1  3 X Average Risk  23.4   11.0        Use the calculated Patient Ratio above and the CHD Risk Table to determine the patient's CHD Risk.        ATP III CLASSIFICATION (LDL):  <100     mg/dL   Optimal  100-129  mg/dL   Near or Above                    Optimal  130-159  mg/dL   Borderline  160-189  mg/dL   High  >190     mg/dL   Very High* 08/25/2008 0440    ABG No results found for this basename: phart, pco2, pco2art, po2, po2art, hco3, tco2, acidbasedef, o2sat     Lab Results  Component Value Date   TSH 0.465 02/01/2013   BNP (last 3 results) No results found for this basename: PROBNP,  in the last 8760 hours Cardiac Panel (last 3 results) No results found for this basename: CKTOTAL, CKMB, TROPONINI, RELINDX,  in the last 72 hours  Iron/TIBC/Ferritin No results found for this basename: iron, tibc, ferritin     EKG  Orders placed in visit on 02/13/13  . HOLTER MONITOR - 24 HOUR     Prior Assessment and Plan Problem List as of 07/04/2013     Respiratory   BRONCHIECTASIS     Musculoskeletal and Integument   RHEUMATOID ARTHRITIS     Genitourinary   Dystrophy, vulva     Other   Tachycardia   HYPERLIPIDEMIA   History of colon cancer   Diarrhea   Dyspepsia   Abdominal pain, epigastric       Imaging: No results  found.

## 2013-07-05 ENCOUNTER — Encounter: Payer: Self-pay | Admitting: *Deleted

## 2013-07-05 LAB — BASIC METABOLIC PANEL
BUN: 11 mg/dL (ref 6–23)
CHLORIDE: 104 meq/L (ref 96–112)
CO2: 25 meq/L (ref 19–32)
CREATININE: 0.68 mg/dL (ref 0.50–1.10)
Calcium: 8.8 mg/dL (ref 8.4–10.5)
GLUCOSE: 88 mg/dL (ref 70–99)
Potassium: 3.7 mEq/L (ref 3.5–5.3)
Sodium: 142 mEq/L (ref 135–145)

## 2013-07-05 LAB — TSH: TSH: 0.873 u[IU]/mL (ref 0.350–4.500)

## 2013-07-12 ENCOUNTER — Ambulatory Visit (INDEPENDENT_AMBULATORY_CARE_PROVIDER_SITE_OTHER): Payer: Medicare Other | Admitting: *Deleted

## 2013-07-12 VITALS — BP 140/78 | HR 78 | Ht 62.0 in | Wt 138.0 lb

## 2013-07-12 DIAGNOSIS — I10 Essential (primary) hypertension: Secondary | ICD-10-CM

## 2013-07-12 NOTE — Patient Instructions (Signed)
Your physician recommends that you continue on your current medications as directed. Please refer to the Current Medication list given to you today.  We will show your blood pressure to Dr Bronson Ing and call you with any changes.  Thank you for choosing [facility]!!

## 2013-07-12 NOTE — Progress Notes (Signed)
Patient here for bp check 140/78. Made copy of bp for Koneswaran review. Will contact pt with any changes in medication or treatment. Average bp is 120s/70s

## 2013-07-17 ENCOUNTER — Other Ambulatory Visit (INDEPENDENT_AMBULATORY_CARE_PROVIDER_SITE_OTHER): Payer: Self-pay | Admitting: Internal Medicine

## 2013-07-17 ENCOUNTER — Telehealth: Payer: Self-pay | Admitting: Cardiovascular Disease

## 2013-07-17 MED ORDER — LISINOPRIL 20 MG PO TABS: 20.0000 mg | ORAL_TABLET | Freq: Every day | ORAL | Status: DC

## 2013-07-17 MED ORDER — OMEPRAZOLE 20 MG PO CPDR: 20.0000 mg | DELAYED_RELEASE_CAPSULE | Freq: Two times a day (BID) | ORAL | Status: DC

## 2013-07-17 NOTE — Telephone Encounter (Signed)
Received fax refill request  Rx # [id number] Medication:  Lisinopril 20 mg tablet Qty 30 Sig:  Take one tablet by mouth once daily Physician:  Bronson Ing

## 2013-07-17 NOTE — Telephone Encounter (Signed)
Medication sent to pharmacy  

## 2013-07-25 ENCOUNTER — Encounter: Payer: Self-pay | Admitting: Cardiovascular Disease

## 2013-08-10 ENCOUNTER — Ambulatory Visit (INDEPENDENT_AMBULATORY_CARE_PROVIDER_SITE_OTHER): Payer: Medicare Other | Admitting: Adult Health

## 2013-08-10 ENCOUNTER — Encounter: Payer: Self-pay | Admitting: Adult Health

## 2013-08-10 ENCOUNTER — Telehealth: Payer: Self-pay | Admitting: Adult Health

## 2013-08-10 VITALS — BP 148/82 | HR 79 | Ht 62.0 in | Wt 139.0 lb

## 2013-08-10 DIAGNOSIS — I10 Essential (primary) hypertension: Secondary | ICD-10-CM

## 2013-08-10 DIAGNOSIS — I4891 Unspecified atrial fibrillation: Secondary | ICD-10-CM

## 2013-08-10 DIAGNOSIS — I480 Paroxysmal atrial fibrillation: Secondary | ICD-10-CM

## 2013-08-10 LAB — BASIC METABOLIC PANEL WITH GFR
BUN: 11 mg/dL (ref 6–23)
CHLORIDE: 105 meq/L (ref 96–112)
CO2: 28 mEq/L (ref 19–32)
CREATININE: 0.65 mg/dL (ref 0.50–1.10)
Calcium: 8.7 mg/dL (ref 8.4–10.5)
GFR, Est Non African American: 85 mL/min
GLUCOSE: 107 mg/dL — AB (ref 70–99)
POTASSIUM: 4.1 meq/L (ref 3.5–5.3)
Sodium: 142 mEq/L (ref 135–145)

## 2013-08-10 MED ORDER — EDOXABAN TOSYLATE 60 MG PO TABS: 60.0000 mg | ORAL_TABLET | Freq: Every day | ORAL | Status: DC

## 2013-08-10 NOTE — Assessment & Plan Note (Signed)
Blood pressure is much better controlled. She brings with her her list of her blood pressure recordings over the last month. Highest is 140/81 with the lowest 115/75. Average average blood pressure running 078 675 systolic. We will continue her on amlodipine 5 mg as directed. As followup.

## 2013-08-10 NOTE — Progress Notes (Deleted)
Name: [name]    DOB: 1933/10/19  Age: 80 y.o.  MR#: [id number]       PCP:  Glo Herring., MD      Insurance: Payor: [employer] MEDICARE / Plan: AARP MEDICARE COMPLETE / Product Type: *No Product type* /   CC:    Chief Complaint  Patient presents with  . Hypertension    VS Filed Vitals:   08/10/13 1315  BP: 148/82  Pulse: 79  Height: 5\' 2"  (1.575 m)  Weight: 139 lb (63.05 kg)  SpO2: 96%    Weights Current Weight  08/10/13 139 lb (63.05 kg)  07/12/13 138 lb (62.596 kg)  07/04/13 139 lb (63.05 kg)    Blood Pressure  BP Readings from Last 3 Encounters:  08/10/13 148/82  07/12/13 140/78  07/04/13 182/62     Admit date:  (Not on file) Last encounter with RMR:  07/04/2013   Allergy Iodinated diagnostic agents; Nerve block tray; Nitrofuran derivatives; and Triamcinolone acetonide  Current Outpatient Prescriptions  Medication Sig Dispense Refill  . amLODipine (NORVASC) 5 MG tablet Take 1 tablet (5 mg total) by mouth daily.  180 tablet  3  . ergocalciferol (VITAMIN D2) 50000 UNITS capsule Take 50,000 Units by mouth once a week.        . fluticasone (FLONASE) 50 MCG/ACT nasal spray Place 1 spray into both nostrils daily.      Marland Kitchen HYDROcodone-acetaminophen (VICODIN) 5-500 MG per tablet Take 1 tablet by mouth at bedtime as needed for pain.       . hydrocortisone (PROCTOZONE-HC) 2.5 % rectal cream Place rectally 2 (two) times daily.  30 g  2  . leflunomide (ARAVA) 20 MG tablet Take 20 mg by mouth daily.      Marland Kitchen lisinopril (PRINIVIL,ZESTRIL) 20 MG tablet Take 1 tablet (20 mg total) by mouth daily.  30 tablet  3  . metoprolol succinate (TOPROL XL) 50 MG 24 hr tablet Take 1 tablet (50 mg total) by mouth 2 (two) times daily.  60 tablet  3  . omeprazole (PRILOSEC) 20 MG capsule Take 1 capsule (20 mg total) by mouth 2 (two) times daily before a meal.  60 capsule  4  . Probiotic Product (PROBIOTIC FORMULA) CAPS Take 1 capsule by mouth every other day.        No current  facility-administered medications for this visit.    Discontinued Meds:   There are no discontinued medications.  Patient Active Problem List   Diagnosis Date Noted  . Essential hypertension 07/04/2013  . Tachycardia 01/31/2013  . Dystrophy, vulva 05/04/2012  . Abdominal pain, epigastric 03/28/2012  . Dyspepsia 04/14/2011  . History of colon cancer 03/17/2011  . Diarrhea 03/17/2011  . HYPERLIPIDEMIA 11/30/2006  . BRONCHIECTASIS 11/30/2006  . RHEUMATOID ARTHRITIS 11/30/2006    LABS    Component Value Date/Time   NA 142 07/04/2013 1639   NA 140 03/22/2013 1035   NA 142 03/21/2012 0900   K 3.7 07/04/2013 1639   K 3.8 03/22/2013 1035   K 3.4* 03/21/2012 0900   CL 104 07/04/2013 1639   CL 102 03/22/2013 1035   CL 101 03/21/2012 0900   CO2 25 07/04/2013 1639   CO2 29 03/22/2013 1035   CO2 30 03/21/2012 0900   GLUCOSE 88 07/04/2013 1639   GLUCOSE 103* 03/22/2013 1035   GLUCOSE 100* 03/21/2012 0900   BUN 11 07/04/2013 1639   BUN 10 03/22/2013 1035   BUN 11 03/21/2012 0900   CREATININE 0.68 07/04/2013  1639   CREATININE 0.71 03/22/2013 1035   CREATININE 0.72 03/21/2012 0900   CREATININE 0.53 02/25/2011 1325   CALCIUM 8.8 07/04/2013 1639   CALCIUM 8.9 03/22/2013 1035   CALCIUM 9.7 03/21/2012 0900   GFRNONAA 81* 03/22/2013 1035   GFRNONAA 81* 03/21/2012 0900   GFRNONAA >90 02/25/2011 1325   GFRAA >90 03/22/2013 1035   GFRAA >90 03/21/2012 0900   GFRAA >90 02/25/2011 1325   CMP     Component Value Date/Time   NA 142 07/04/2013 1639   K 3.7 07/04/2013 1639   CL 104 07/04/2013 1639   CO2 25 07/04/2013 1639   GLUCOSE 88 07/04/2013 1639   BUN 11 07/04/2013 1639   CREATININE 0.68 07/04/2013 1639   CREATININE 0.71 03/22/2013 1035   CALCIUM 8.8 07/04/2013 1639   PROT 6.9 03/22/2013 1035   ALBUMIN 3.5 03/22/2013 1035   AST 21 03/22/2013 1035   ALT 14 03/22/2013 1035   ALKPHOS 66 03/22/2013 1035   BILITOT 0.4 03/22/2013 1035   GFRNONAA 81* 03/22/2013 1035   GFRAA >90 03/22/2013 1035       Component Value  Date/Time   WBC 5.6 03/22/2013 1035   WBC 5.3 02/01/2013 1010   WBC 4.8 03/21/2012 0900   HGB 12.6 03/22/2013 1035   HGB 13.8 02/01/2013 1010   HGB 13.3 03/21/2012 0900   HCT 37.9 03/22/2013 1035   HCT 41.1 02/01/2013 1010   HCT 39.7 03/21/2012 0900   MCV 89.6 03/22/2013 1035   MCV 89.2 02/01/2013 1010   MCV 88.8 03/21/2012 0900    Lipid Panel     Component Value Date/Time   CHOL  Value: 209        ATP III CLASSIFICATION:  <200     mg/dL   Desirable  200-239  mg/dL   Borderline High  >=240    mg/dL   High       * 08/25/2008 0440   TRIG 126 08/25/2008 0440   HDL 44 08/25/2008 0440   CHOLHDL 4.8 08/25/2008 0440   VLDL 25 08/25/2008 0440   LDLCALC  Value: 140        Total Cholesterol/HDL:CHD Risk Coronary Heart Disease Risk Table                     Men   Women  1/2 Average Risk   3.4   3.3  Average Risk       5.0   4.4  2 X Average Risk   9.6   7.1  3 X Average Risk  23.4   11.0        Use the calculated Patient Ratio above and the CHD Risk Table to determine the patient's CHD Risk.        ATP III CLASSIFICATION (LDL):  <100     mg/dL   Optimal  100-129  mg/dL   Near or Above                    Optimal  130-159  mg/dL   Borderline  160-189  mg/dL   High  >190     mg/dL   Very High* 08/25/2008 0440    ABG No results found for this basename: phart, pco2, pco2art, po2, po2art, hco3, tco2, acidbasedef, o2sat     Lab Results  Component Value Date   TSH 0.873 07/04/2013   BNP (last 3 results) No results found for this basename: PROBNP,  in the last 8760 hours Cardiac Panel (  last 3 results) No results found for this basename: CKTOTAL, CKMB, TROPONINI, RELINDX,  in the last 72 hours  Iron/TIBC/Ferritin/ %Sat No results found for this basename: iron, tibc, ferritin, ironpctsat     EKG Orders placed in visit on 07/04/13  . CARDIAC EVENT MONITOR     Prior Assessment and Plan Problem List as of 08/10/2013     Cardiovascular and Mediastinum   Essential hypertension   Last Assessment & Plan    07/04/2013 Office Visit Written 07/04/2013  4:53 PM by Lendon Colonel, NP     Blood pressure was elevated on last office visit at 171/70 at which time lisinopril was increased to 20 mg daily. Blood pressure is elevated again today in the office. I rechecked it manually and found to be accurate with triage recording. I will begin her on amlodipine 5 mg daily. She is advised to get a blood pressure cuff, and take it at the same time every day and record. I provided a pressure recording sheet, which she is to document her blood pressure. She will return in the office in next week for a nurse visit and blood pressure check on new medications. She is to bring with her her record of blood pressures at home.      Respiratory   BRONCHIECTASIS     Musculoskeletal and Integument   RHEUMATOID ARTHRITIS     Genitourinary   Dystrophy, vulva     Other   Tachycardia   Last Assessment & Plan   07/04/2013 Office Visit Written 07/04/2013  4:51 PM by Lendon Colonel, NP     She continues to have episodes of tachycardia to which she is very sensitive causing her to feel pressure in her chest, usually lasting about an hour sometimes longer. She has had 4 episodes over the last 3 months which is documented. She has had a 24-hour Holter monitor placed but not recording a rapid heart rhythm. She remains medically compliant.  We will place a 30 day cardiac monitor on this patient. The patient is having more frequent episodes of tachycardia. She states that she has a sister who had the same episodes and eventually was seen by electrophysiology, and required ablation. This may be something we need to address should be have some documented tachycardia or other arrhythmias. For now make any changes on her beta blocker as he here in the office it is 65 beats per minute and do not wish to cause significant bradycardia when she is not having these episodes.  A TSH was drawn in January of 2015 and 0.465. I will repeat this  along with the BMET to evaluate for dehydration or abnormal levels. She will return to the office and by weeks to discuss cardiac monitor results. She is advised that if any tachycardic arrhythmias occur she will be notified by the monitoring company and we will be notified as well. She verbalizes understanding    HYPERLIPIDEMIA   History of colon cancer   Diarrhea   Dyspepsia   Abdominal pain, epigastric       Imaging: No results found.

## 2013-08-10 NOTE — Assessment & Plan Note (Addendum)
Cardiac monitor revealed paroxysmal atrial fibrillation, this is also intermittent with normal sinus with frequent PACs. The patient did have one elevated heart rate of 152 beats per minute documented, and the patient was symptomatic with it, stating she feel her heart racing, and she was mildly short of breath with it.  Since beginning amlodipine, the patient states that the episodes have almost been eliminated, and her blood pressure is better controlled, even amlodipine is not an actual rate reducing medications.  The patient has a CHADS-Vasc Score of 4. I have shown the cardiac monitor results to Dr. branch on site, and discuss anticoagulation options with the patient. It also discuss potential side effect of bleeding. She wishes to have the least expensive medication available with the least side effects. I suggested Savaysa, as we can provide this for her at $4 a month. I reviewed GFR per labs. We will repeat the BMET as her GFR was greater than 90 in the past. With BMET is evaluated, we will have her begin anticoagulation therapy is recommended in her month supply for free. She will followup in the office in one month with Dr. Pennelope Bracken for ongoing assessment and management. Can consider increasing metoprolol to 75 mg twice a day if she continues to have recurrent rapid heart rhythms, although she states she has not had any in several weeks.

## 2013-08-10 NOTE — Patient Instructions (Addendum)
Your physician recommends that you schedule a follow-up appointment in: 1 month with Dr Bronson Ing   Your physician has recommended you make the following change in your medication:   Start Savaysa 60 mg daily. Gave samples and card.  .Your physician recommends that you return for lab work. CBC, BMET w/ GFR  Please hold Savaysa until we get blood work results.

## 2013-08-10 NOTE — Telephone Encounter (Signed)
Please see refill bin / tgs  °

## 2013-08-10 NOTE — Progress Notes (Signed)
HPI: [name] is a 80 year old patient of Dr. Bronson Ing, for follow for ongoing assessment and management of tachycardia and hypertension. The patient was last seen in the office in June of 2050 with complaints of recurrent tachycardia. Patient states his occurred [place] or [place] heavy objects. Once while cooking in the kitchen. She denies medical noncompliance. She is to be hypertensive in the office I began her on amlodipine 5 mg daily. Heart rate was 65 beats per minute in the office with a blood pressure 182/62 she did have a 24 Holter monitor placed but it did not record any rapid rhythms. I placed a 30 day cardiac monitor on the patient and she is complaining of more frequent episodes of tachycardia. TSH was drawn in January and found to be 0.465.  She comes today with complaints that cardiac monitor caused her to have whelps and itching on her chest. She wore it for 20 days only. She is tolerating amlodipine and has felt better on this medication, with one episode of rapid HR while wearing monitor.   Allergies  Allergen Reactions  . Iodinated Diagnostic Agents Anaphylaxis    08-12-10 Pt had severe reaction with itching/hives/loss of consciousness within 5 minutes of CEPI today.  She received both Kenalog and Iondinated Contrast, so not certain which agent caused reaction.  If patient returns for another CEPI, Dr. Jobe Igo considers using Decadron and a 13-hour prep.  jkl  . Nerve Block Tray Anaphylaxis    Patient states that she was sent to have Epidural for her shoulder. After rec'ving she remembers saying that she was itching all over  And that's it. She was transported to hospital  By EMS. Stayed overnight.  Bufford Spikes Derivatives Hives and Nausea And Vomiting  . Triamcinolone Acetonide Anaphylaxis    08-12-10  Pt had severe reaction with itching/hives/loss of consciousness within five minutes of CEPI today.  She received both Kenalog and Iodinated Contrast,  so not certain which agent caused reaction.  If pt returns for another cervical epidural steroid injection, Dr. Jobe Igo suggests using Decadron and a 13-hr prep.  JKL PATIENT USES PROCTOCREAM 2.5% WITHOUT DIFFICULTY FOR VULVAR DYSTROPHY    Current Outpatient Prescriptions  Medication Sig Dispense Refill  . amLODipine (NORVASC) 5 MG tablet Take 1 tablet (5 mg total) by mouth daily.  180 tablet  3  . ergocalciferol (VITAMIN D2) 50000 UNITS capsule Take 50,000 Units by mouth once a week.        . fluticasone (FLONASE) 50 MCG/ACT nasal spray Place 1 spray into both nostrils daily.      Marland Kitchen HYDROcodone-acetaminophen (VICODIN) 5-500 MG per tablet Take 1 tablet by mouth at bedtime as needed for pain.       . hydrocortisone (PROCTOZONE-HC) 2.5 % rectal cream Place rectally 2 (two) times daily.  30 g  2  . leflunomide (ARAVA) 20 MG tablet Take 20 mg by mouth daily.      Marland Kitchen lisinopril (PRINIVIL,ZESTRIL) 20 MG tablet Take 1 tablet (20 mg total) by mouth daily.  30 tablet  3  . metoprolol succinate (TOPROL XL) 50 MG 24 hr tablet Take 1 tablet (50 mg total) by mouth 2 (two) times daily.  60 tablet  3  . omeprazole (PRILOSEC) 20 MG capsule Take 1 capsule (20 mg total) by mouth 2 (two) times daily before a meal.  60 capsule  4  . Probiotic Product (PROBIOTIC FORMULA) CAPS Take 1 capsule by mouth every other day.       Marland Kitchen  edoxaban (SAVAYSA) 60 MG TABS tablet Take 60 mg by mouth daily.  30 tablet  0   No current facility-administered medications for this visit.    Past Medical History  Diagnosis Date  . Hypertension   . Colon cancer   . RUQ pain   . Rheumatoid arthritis(714.0)   . Cervicalgia   . Degeneration of cervical intervertebral disc   . Pinched nerve in shoulder   . Yeast infection   . Vulvar dystrophy     Past Surgical History  Procedure Laterality Date  . Colonoscopy  04/02/2009  . Colonoscopy  06/04/05  . Hemicolectomy  10/05  . Cholecystectomy  2002  . Appendectomy  with hysterectomy    . Abdominal hysterectomy  1960'S  . Back surgery  12/08/10    plate/screws C3 C4  . Colonoscopy with esophagogastroduodenoscopy (egd) N/A 04/08/2012    Procedure: COLONOSCOPY WITH ESOPHAGOGASTRODUODENOSCOPY (EGD);  Surgeon: Rogene Houston, MD;  Location: [facility];  Service: Endoscopy;  Laterality: N/A;  130-moved to [place] notified pt    ROS: Review of systems complete and found to be negative unless listed above  PHYSICAL EXAM BP 148/82  Pulse 79  Ht 5\' 2"  (1.575 m)  Wt 139 lb (63.05 kg)  BMI 25.42 kg/m2  SpO2 96% General: Well developed, well nourished, in no acute distress Head: Eyes PERRLA, No xanthomas.   Normal cephalic and atramatic  Lungs: Clear bilaterally to auscultation and percussion. Heart: HRIR S1 S2, without MRG.  Pulses are 2+ & equal.            No carotid bruit. No JVD.  No abdominal bruits. No femoral bruits. Abdomen: Bowel sounds are positive, abdomen soft and non-tender without masses or                  Hernia's noted. Msk:  Back normal, normal gait. Normal strength and tone for age. Extremities: No clubbing, cyanosis or edema.  DP +1 Neuro: Alert and oriented X 3. Psych:  Good affect, responds appropriately   EKG: Sinus rhythm with frequent PACs. Rate of 81 beats per minute  ASSESSMENT AND PLAN

## 2013-08-10 NOTE — Assessment & Plan Note (Signed)
Cardiac monitor demonstrated PAF with one episode of symptomatic rapid HR while going to bed. I will begin Savaysa 60 mg daily,

## 2013-08-11 LAB — CBC
HEMATOCRIT: 37.5 % (ref 36.0–46.0)
HEMOGLOBIN: 13.2 g/dL (ref 12.0–15.0)
MCH: 29.8 pg (ref 26.0–34.0)
MCHC: 35.2 g/dL (ref 30.0–36.0)
MCV: 84.7 fL (ref 78.0–100.0)
Platelets: 228 10*3/uL (ref 150–400)
RBC: 4.43 MIL/uL (ref 3.87–5.11)
RDW: 14 % (ref 11.5–15.5)
WBC: 6.1 10*3/uL (ref 4.0–10.5)

## 2013-08-11 NOTE — Addendum Note (Signed)
Addended by: Barbarann Ehlers A on: 08/11/2013 08:14 AM   Modules accepted: Orders

## 2013-08-14 ENCOUNTER — Telehealth: Payer: Self-pay

## 2013-08-14 NOTE — Telephone Encounter (Signed)
BMET with GFR normal,lm that pt may start Savaysa 60 mg daily. Told pt to call if she had any questions      .

## 2013-08-17 ENCOUNTER — Other Ambulatory Visit: Payer: Self-pay | Admitting: *Deleted

## 2013-08-17 DIAGNOSIS — R000 Tachycardia, unspecified: Secondary | ICD-10-CM

## 2013-08-21 ENCOUNTER — Ambulatory Visit: Payer: Medicare Other | Admitting: Cardiovascular Disease

## 2013-09-01 ENCOUNTER — Other Ambulatory Visit: Payer: Self-pay | Admitting: *Deleted

## 2013-09-01 ENCOUNTER — Telehealth: Payer: Self-pay | Admitting: *Deleted

## 2013-09-01 NOTE — Progress Notes (Signed)
Faxed prio auth in for savaysa 60 mg to [facility] [employer]

## 2013-09-01 NOTE — Telephone Encounter (Signed)
Faxed received that pt insurance will not cover savaysa. They will only cover eliquis, pradaxa or xarelto. Will message Jory Sims to advice on which to switch to. Pt wants the cheapest possible

## 2013-09-04 ENCOUNTER — Telehealth: Payer: Self-pay | Admitting: Adult Health

## 2013-09-04 MED ORDER — APIXABAN 5 MG PO TABS: 5.0000 mg | ORAL_TABLET | Freq: Two times a day (BID) | ORAL | Status: DC

## 2013-09-04 NOTE — Telephone Encounter (Signed)
Sent Eliquis to pharmacy. Notified pt

## 2013-09-04 NOTE — Telephone Encounter (Signed)
Message copied by Desma Mcgregor on Mon Sep 04, 2013  7:39 AM ------      Message from: Lendon Colonel      Created: Sun Sep 03, 2013  5:07 PM       May prescribe Eliquis 5 mg BID # 60 with 8 refills      ----- Message -----         From: Desma Mcgregor, CMA         Sent: 09/01/2013   3:38 PM           To: Lendon Colonel, NP            Pt insurance will not cover Savaysa. Will only cover Eliquis, Pradaxa, Xarelto. Pt would like the cheapest possible and also said she has enough samples to last until 09/08/13. Told her we would call her back by 09/06/13. Please advise       ------

## 2013-09-04 NOTE — Telephone Encounter (Signed)
Please see refill bin / tgs  °

## 2013-09-06 ENCOUNTER — Telehealth: Payer: Self-pay | Admitting: *Deleted

## 2013-09-06 NOTE — Telephone Encounter (Signed)
Gave pt samples of Eliquis while waiting for prior auth 4 boxes

## 2013-09-06 NOTE — Telephone Encounter (Signed)
Pharmacy received request. Pt notified

## 2013-09-06 NOTE — Telephone Encounter (Signed)
PT stopped by today because the pharmacy is still waiting for [place] to  [place] on her eliquis. Looks like it was already done on 09/04/13 could we call and make sure they have what they need? Pt only has enough for three days.

## 2013-09-07 ENCOUNTER — Telehealth: Payer: Self-pay | Admitting: *Deleted

## 2013-09-07 NOTE — Telephone Encounter (Signed)
Received prior auth and faxed back to pharmacy for insurance for the Eliquis. Notified pt

## 2013-09-08 ENCOUNTER — Telehealth: Payer: Self-pay | Admitting: Adult Health

## 2013-09-08 NOTE — Telephone Encounter (Signed)
Prior auth faxed yesterday by Hilarie Fredrickson CMA,the request today was dated from yesterday and was duplicate

## 2013-09-08 NOTE — Telephone Encounter (Signed)
Please see refill bin for prior authorization form / tgs

## 2013-09-11 ENCOUNTER — Telehealth: Payer: Self-pay | Admitting: Adult Health

## 2013-09-11 NOTE — Telephone Encounter (Signed)
Prior Authorization / please see refill bin / tgs

## 2013-09-11 NOTE — Telephone Encounter (Signed)
Faxed prio [name]

## 2013-09-15 ENCOUNTER — Encounter: Payer: Self-pay | Admitting: Cardiovascular Disease

## 2013-09-15 ENCOUNTER — Ambulatory Visit (INDEPENDENT_AMBULATORY_CARE_PROVIDER_SITE_OTHER): Payer: Medicare Other | Admitting: Cardiovascular Disease

## 2013-09-15 VITALS — BP 124/70 | HR 74 | Ht 62.0 in | Wt 138.0 lb

## 2013-09-15 DIAGNOSIS — I10 Essential (primary) hypertension: Secondary | ICD-10-CM

## 2013-09-15 DIAGNOSIS — I4891 Unspecified atrial fibrillation: Secondary | ICD-10-CM

## 2013-09-15 DIAGNOSIS — I480 Paroxysmal atrial fibrillation: Secondary | ICD-10-CM

## 2013-09-15 DIAGNOSIS — Z5181 Encounter for therapeutic drug level monitoring: Secondary | ICD-10-CM

## 2013-09-15 DIAGNOSIS — Z7901 Long term (current) use of anticoagulants: Secondary | ICD-10-CM

## 2013-09-15 NOTE — Progress Notes (Signed)
Patient ID: [name], female   DOB: 1933-04-03, 80 y.o.   MRN: [id number]      SUBJECTIVE: The patient is here to follow up for paroxysmal atrial fibrillation and hypertension. She wore an event monitor which detected paroxysms of atrial flutter and fibrillation with RVR. She has since been started on [facility] for anticoagulation. She could not afford this so she was given samples of Eliquis and has yet to fill out the paperwork for patient assistance.  She is feeling well and denies chest pain, palpitations, shortness of breath and leg swelling. She has chronic positional vertigo which is unchanged.    Review of Systems: As per "subjective", otherwise negative.  Allergies  Allergen Reactions  . Iodinated Diagnostic Agents Anaphylaxis    08-12-10 Pt had severe reaction with itching/hives/loss of consciousness within 5 minutes of CEPI today.  She received both Kenalog and Iondinated Contrast, so not certain which agent caused reaction.  If patient returns for another CEPI, Dr. Jobe Igo considers using Decadron and a 13-hour prep.  jkl  . Nerve Block Tray Anaphylaxis    Patient states that she was sent to have Epidural for her shoulder. After rec'ving she remembers saying that she was itching all over  And that's it. She was transported to hospital  By EMS. Stayed overnight.  Bufford Spikes Derivatives Hives and Nausea And Vomiting  . Triamcinolone Acetonide Anaphylaxis    08-12-10  Pt had severe reaction with itching/hives/loss of consciousness within five minutes of CEPI today.  She received both Kenalog and Iodinated Contrast, so not certain which agent caused reaction.  If pt returns for another cervical epidural steroid injection, Dr. Jobe Igo suggests using Decadron and a 13-hr prep.  JKL PATIENT USES PROCTOCREAM 2.5% WITHOUT DIFFICULTY FOR VULVAR DYSTROPHY    Current Outpatient Prescriptions  Medication Sig Dispense Refill  . amLODipine (NORVASC) 5 MG tablet Take 1 tablet (5 mg total)  by mouth daily.  180 tablet  3  . apixaban (ELIQUIS) 5 MG TABS tablet Take 1 tablet (5 mg total) by mouth 2 (two) times daily.  60 tablet  8  . ergocalciferol (VITAMIN D2) 50000 UNITS capsule Take 50,000 Units by mouth once a week.        . fluticasone (FLONASE) 50 MCG/ACT nasal spray Place 1 spray into both nostrils daily.      Marland Kitchen HYDROcodone-acetaminophen (VICODIN) 5-500 MG per tablet Take 1 tablet by mouth at bedtime as needed for pain.       . hydrocortisone (PROCTOZONE-HC) 2.5 % rectal cream Place rectally 2 (two) times daily.  30 g  2  . leflunomide (ARAVA) 20 MG tablet Take 20 mg by mouth daily.      Marland Kitchen lisinopril (PRINIVIL,ZESTRIL) 20 MG tablet Take 1 tablet (20 mg total) by mouth daily.  30 tablet  3  . metoprolol succinate (TOPROL XL) 50 MG 24 hr tablet Take 1 tablet (50 mg total) by mouth 2 (two) times daily.  60 tablet  3  . omeprazole (PRILOSEC) 20 MG capsule Take 1 capsule (20 mg total) by mouth 2 (two) times daily before a meal.  60 capsule  4  . Probiotic Product (PROBIOTIC FORMULA) CAPS Take 1 capsule by mouth every other day.        No current facility-administered medications for this visit.    Past Medical History  Diagnosis Date  . Hypertension   . Colon cancer   . RUQ pain   . Rheumatoid arthritis(714.0)   . Cervicalgia   .  Degeneration of cervical intervertebral disc   . Pinched nerve in shoulder   . Yeast infection   . Vulvar dystrophy     Past Surgical History  Procedure Laterality Date  . Colonoscopy  04/02/2009  . Colonoscopy  06/04/05  . Hemicolectomy  10/05  . Cholecystectomy  2002  . Appendectomy  with hysterectomy  . Abdominal hysterectomy  1960'S  . Back surgery  12/08/10    plate/screws C3 C4  . Colonoscopy with esophagogastroduodenoscopy (egd) N/A 04/08/2012    Procedure: COLONOSCOPY WITH ESOPHAGOGASTRODUODENOSCOPY (EGD);  Surgeon: Rogene Houston, MD;  Location: [facility];  Service: Endoscopy;  Laterality: N/A;  130-moved to [place] notified  pt    History   Social History  . Marital Status: Married    Spouse Name: N/A    Number of Children: N/A  . Years of Education: N/A   Occupational History  . Not on file.   Social History Main Topics  . Smoking status: Never Smoker   . Smokeless tobacco: Never Used  . Alcohol Use: No  . Drug Use: No  . Sexual Activity: No   Other Topics Concern  . Not on file   Social History Narrative  . No narrative on file     Filed Vitals:   09/15/13 1039  BP: 124/70  Pulse: 74  Height: 5\' 2"  (1.575 m)  Weight: 138 lb (62.596 kg)    PHYSICAL EXAM General: NAD HEENT: Normal. Neck: No JVD, no thyromegaly. Lungs: Clear to auscultation bilaterally with normal respiratory effort. CV: Nondisplaced PMI.  Regular rate and rhythm, normal S1/S2, no S3/S4, no murmur. No pretibial or periankle edema.  No carotid bruit.  Normal pedal pulses.  Abdomen: Soft, nontender, no hepatosplenomegaly, no distention.  Neurologic: Alert and oriented x 3.  Psych: Normal affect. Skin: Normal. Musculoskeletal: Normal range of motion, no gross deformities. Extremities: No clubbing or cyanosis.   ECG: Most recent ECG reviewed.      ASSESSMENT AND PLAN: 1. Paroxysmal atrial fibrillation: Controlled on Toprol-XL 50 mg twice daily. Asymptomatic in several weeks. I have asked her to finish her samples of Eliquis and fill out the necessary paperwork. If this too is indeed expensive, I will start her on warfarin as per her request and enroll her in our anticoagulation clinic. 2. Essential HTN: Controlled on present therapy which includes lisinopril 20 mg daily.  Dispo: f/u 6 months.   Kate Sable, M.D., F.A.C.C.

## 2013-09-15 NOTE — Patient Instructions (Signed)
Your physician wants you to follow-up in: 6 months You will receive a reminder letter in the mail two months in advance. If you don't receive a letter, please call our office to schedule the follow-up appointment.     Your physician recommends that you continue on your current medications as directed. Please refer to the Current Medication list given to you today.    Return Pt Assistance form to see if you qualify for discount for Eliquis     Thank you for choosing [facility] !

## 2013-09-19 ENCOUNTER — Telehealth: Payer: Self-pay

## 2013-09-19 NOTE — Telephone Encounter (Signed)
Faxed Pt Assistance form to [facility]

## 2013-10-19 ENCOUNTER — Telehealth: Payer: Self-pay

## 2013-10-19 NOTE — Telephone Encounter (Signed)
Per Chastidy at [facility] they overlooked pt rx that was faxed on 10/12/13 and they will mail pt eliquis in 3-5 business days,more samples left for pt

## 2013-11-10 ENCOUNTER — Other Ambulatory Visit: Payer: Self-pay

## 2013-11-10 MED ORDER — LISINOPRIL 20 MG PO TABS: 20.0000 mg | ORAL_TABLET | Freq: Every day | ORAL | Status: DC

## 2013-11-10 MED ORDER — METOPROLOL SUCCINATE ER 50 MG PO TB24: 50.0000 mg | ORAL_TABLET | Freq: Two times a day (BID) | ORAL | Status: DC

## 2013-11-13 ENCOUNTER — Encounter: Payer: Self-pay | Admitting: Cardiovascular Disease

## 2013-11-14 ENCOUNTER — Telehealth: Payer: Self-pay

## 2013-11-14 NOTE — Telephone Encounter (Signed)
We have sent additional pt info-proof of income-again,out of pocket pharmacy expenses,and Prior Auth to [employer] Pt assistance program on Friday 10/30 and called today checking status and was told to call this Friday 11/6 for status

## 2013-11-15 ENCOUNTER — Telehealth: Payer: Self-pay | Admitting: Cardiovascular Disease

## 2013-11-15 MED ORDER — DABIGATRAN ETEXILATE MESYLATE 150 MG PO CAPS: 150.0000 mg | ORAL_CAPSULE | Freq: Two times a day (BID) | ORAL | Status: DC

## 2013-11-15 NOTE — Telephone Encounter (Signed)
Pt will pick up samples today. Can pt finish eliquis samples she has 2 weeks supply left

## 2013-11-15 NOTE — Telephone Encounter (Signed)
Pt made aware

## 2013-11-15 NOTE — Telephone Encounter (Signed)
That would be fine 

## 2013-11-15 NOTE — Telephone Encounter (Signed)
Pt was denied for pt assistance program. Pt wanting to switch from eliquis. Will forward to Dr. Bronson Ing

## 2013-11-15 NOTE — Telephone Encounter (Signed)
Patient would like to discuss other options than Eliquis.  States she can not afford it.  Please call patient back. / tgs

## 2013-11-15 NOTE — Telephone Encounter (Signed)
Alternatively, she could try Pradaxa 150 mg bid.

## 2013-11-15 NOTE — Telephone Encounter (Signed)
Will need warfarin and enrollment in anticoagulation clinic.

## 2013-11-22 ENCOUNTER — Telehealth: Payer: Self-pay | Admitting: *Deleted

## 2013-11-22 MED ORDER — LISINOPRIL 20 MG PO TABS: 20.0000 mg | ORAL_TABLET | Freq: Two times a day (BID) | ORAL | Status: DC

## 2013-11-22 NOTE — Telephone Encounter (Signed)
Would increase lisinopril to 20 mg bid.

## 2013-11-22 NOTE — Telephone Encounter (Signed)
Pt made aware to take lisinopril 20 mg twice daily. Updated medication list. Pt understood

## 2013-11-22 NOTE — Telephone Encounter (Signed)
Pt BP has been running 190s/100s today only. Pt c/o being a little dizzy with no other symptoms. Pt wanted to make Dr. Bronson Ing aware, pt is going to urgent care for evaluation. Will forward to Dr. Bronson Ing.   Pt was also approved for Eliquis for 1 year of free medication by [employer]. Pt made aware and will pick medication up.

## 2013-11-22 NOTE — Addendum Note (Signed)
Addended by: Julian Hy T on: 11/22/2013 03:14 PM   Modules accepted: Orders

## 2013-12-17 ENCOUNTER — Other Ambulatory Visit (INDEPENDENT_AMBULATORY_CARE_PROVIDER_SITE_OTHER): Payer: Self-pay | Admitting: Internal Medicine

## 2013-12-26 ENCOUNTER — Telehealth: Payer: Self-pay | Admitting: Cardiovascular Disease

## 2013-12-26 ENCOUNTER — Other Ambulatory Visit: Payer: Self-pay

## 2013-12-26 MED ORDER — AMLODIPINE BESYLATE 5 MG PO TABS: 5.0000 mg | ORAL_TABLET | Freq: Every day | ORAL | Status: DC

## 2013-12-26 NOTE — Telephone Encounter (Signed)
Please see refill bin / tgs  °

## 2014-01-01 ENCOUNTER — Telehealth: Payer: Self-pay | Admitting: *Deleted

## 2014-01-01 NOTE — Telephone Encounter (Signed)
Letter sent on 11/23/13 that pt was approved for free eliquis,pt never received.Spoke with [employer] and was told they need to honor the letter approving her meds and they would call me back. I gave pt 4 boxes of samples

## 2014-01-01 NOTE — Telephone Encounter (Signed)
Pt needs eliquis she only has enough to last to sat

## 2014-01-16 ENCOUNTER — Other Ambulatory Visit (HOSPITAL_COMMUNITY): Payer: Self-pay | Admitting: Internal Medicine

## 2014-01-16 DIAGNOSIS — Z1231 Encounter for screening mammogram for malignant neoplasm of breast: Secondary | ICD-10-CM

## 2014-01-24 ENCOUNTER — Ambulatory Visit (HOSPITAL_COMMUNITY)
Admission: RE | Admit: 2014-01-24 | Discharge: 2014-01-24 | Disposition: A | Discharge status: Home / Self Care | Payer: Medicare Other | Source: Ambulatory Visit | Attending: Internal Medicine | Admitting: Internal Medicine

## 2014-01-24 DIAGNOSIS — Z1231 Encounter for screening mammogram for malignant neoplasm of breast: Secondary | ICD-10-CM | POA: Insufficient documentation

## 2014-02-01 DIAGNOSIS — M150 Primary generalized (osteo)arthritis: Secondary | ICD-10-CM | POA: Diagnosis not present

## 2014-02-01 DIAGNOSIS — E559 Vitamin D deficiency, unspecified: Secondary | ICD-10-CM | POA: Diagnosis not present

## 2014-02-01 DIAGNOSIS — M5136 Other intervertebral disc degeneration, lumbar region: Secondary | ICD-10-CM | POA: Diagnosis not present

## 2014-02-01 DIAGNOSIS — M0589 Other rheumatoid arthritis with rheumatoid factor of multiple sites: Secondary | ICD-10-CM | POA: Diagnosis not present

## 2014-02-19 DIAGNOSIS — H5211 Myopia, right eye: Secondary | ICD-10-CM | POA: Diagnosis not present

## 2014-02-19 DIAGNOSIS — H524 Presbyopia: Secondary | ICD-10-CM | POA: Diagnosis not present

## 2014-02-19 DIAGNOSIS — H52223 Regular astigmatism, bilateral: Secondary | ICD-10-CM | POA: Diagnosis not present

## 2014-02-19 DIAGNOSIS — H5202 Hypermetropia, left eye: Secondary | ICD-10-CM | POA: Diagnosis not present

## 2014-02-22 ENCOUNTER — Other Ambulatory Visit: Payer: Self-pay | Admitting: Cardiovascular Disease

## 2014-02-22 MED ORDER — LISINOPRIL 20 MG PO TABS: 20.0000 mg | ORAL_TABLET | Freq: Two times a day (BID) | ORAL | Status: DC

## 2014-02-22 NOTE — Telephone Encounter (Signed)
Patient needs new RX for Lisinopril 20 mg #60 sent to [facility] [place] / tgs

## 2014-03-12 ENCOUNTER — Other Ambulatory Visit: Payer: Self-pay | Admitting: Cardiovascular Disease

## 2014-03-12 ENCOUNTER — Telehealth: Payer: Self-pay | Admitting: Cardiovascular Disease

## 2014-03-12 MED ORDER — LISINOPRIL 20 MG PO TABS: 20.0000 mg | ORAL_TABLET | Freq: Two times a day (BID) | ORAL | Status: DC

## 2014-03-12 MED ORDER — METOPROLOL SUCCINATE ER 50 MG PO TB24: 50.0000 mg | ORAL_TABLET | Freq: Two times a day (BID) | ORAL | Status: DC

## 2014-03-12 NOTE — Telephone Encounter (Signed)
escribed rx's

## 2014-03-12 NOTE — Telephone Encounter (Signed)
Received fax refill request  Rx # [id number] Medication:  Lisinopril 20 mg tablet Qty 30 Sig:  Take one tablet by mouth twice a day Physician:  Bronson Ing Received fax refill request  Rx # [id number] Medication:  Metoprolol Succ ER 50 mg tab Qty 60 Sig:  Take one tablet by mouth twice A day Physician:  Bronson Ing

## 2014-03-12 NOTE — Telephone Encounter (Signed)
Received fax refill request  Rx # [id number] Medication:  Lisinopril 20 mg tablet Qty 30  Sig:  Take one tablet by mouth twice a day Physician:  Bronson Ing Received fax refill request  Rx # [id number] Medication:  Metoprolol Succ Er 50 mg tab Qty 60 Sig:  Take one tablet by mouth twice a day Physician:  Bronson Ing

## 2014-03-22 ENCOUNTER — Ambulatory Visit (INDEPENDENT_AMBULATORY_CARE_PROVIDER_SITE_OTHER): Payer: Medicare Other | Admitting: Cardiovascular Disease

## 2014-03-22 ENCOUNTER — Encounter: Payer: Self-pay | Admitting: Cardiovascular Disease

## 2014-03-22 VITALS — BP 140/70 | HR 72 | Ht 62.0 in | Wt 137.2 lb

## 2014-03-22 DIAGNOSIS — Z5181 Encounter for therapeutic drug level monitoring: Secondary | ICD-10-CM | POA: Diagnosis not present

## 2014-03-22 DIAGNOSIS — I480 Paroxysmal atrial fibrillation: Secondary | ICD-10-CM

## 2014-03-22 DIAGNOSIS — Z7901 Long term (current) use of anticoagulants: Secondary | ICD-10-CM

## 2014-03-22 DIAGNOSIS — I10 Essential (primary) hypertension: Secondary | ICD-10-CM | POA: Diagnosis not present

## 2014-03-22 NOTE — Addendum Note (Signed)
Addended by: Levonne Hubert on: 03/22/2014 11:16 AM   Modules accepted: Level of Service

## 2014-03-22 NOTE — Progress Notes (Signed)
Patient ID: [name], female   DOB: Jun 03, 1933, 81 y.o.   MRN: [id number]      SUBJECTIVE: The patient is here to follow up for paroxysmal atrial fibrillation and hypertension. She is on apixaban for anticoagulation. She has had one episode of rapid palpitations on January 16 which spontaneously resolved. She has felt well and denies chest pain, shortness of breath, bleeding problems, and leg swelling. She said she feels very well and is grateful for her care.   Review of Systems: As per "subjective", otherwise negative.  Allergies  Allergen Reactions  . Iodinated Diagnostic Agents Anaphylaxis    08-12-10 Pt had severe reaction with itching/hives/loss of consciousness within 5 minutes of CEPI today.  She received both Kenalog and Iondinated Contrast, so not certain which agent caused reaction.  If patient returns for another CEPI, Dr. Jobe Igo considers using Decadron and a 13-hour prep.  jkl  . Nerve Block Tray Anaphylaxis    Patient states that she was sent to have Epidural for her shoulder. After rec'ving she remembers saying that she was itching all over  And that's it. She was transported to hospital  By EMS. Stayed overnight.  [place] Spikes Derivatives Hives and Nausea And Vomiting  . Triamcinolone Acetonide Anaphylaxis    08-12-10  Pt had severe reaction with itching/hives/loss of consciousness within five minutes of CEPI today.  She received both Kenalog and Iodinated Contrast, so not certain which agent caused reaction.  If pt returns for another cervical epidural steroid injection, Dr. Jobe Igo suggests using Decadron and a 13-hr prep.  JKL PATIENT USES PROCTOCREAM 2.5% WITHOUT DIFFICULTY FOR VULVAR DYSTROPHY    Current Outpatient Prescriptions  Medication Sig Dispense Refill  . amLODipine (NORVASC) 5 MG tablet Take 1 tablet (5 mg total) by mouth daily. 90 tablet 3  . apixaban (ELIQUIS) 5 MG TABS tablet Take 1 tablet (5 mg total) by mouth 2 (two) times daily. 60 tablet 8  .  ergocalciferol (VITAMIN D2) 50000 UNITS capsule Take 50,000 Units by mouth once a week.      . fluticasone (FLONASE) 50 MCG/ACT nasal spray Place 1 spray into both nostrils daily.    Marland Kitchen HYDROcodone-acetaminophen (VICODIN) 5-500 MG per tablet Take 1 tablet by mouth at bedtime as needed for pain.     . hydrocortisone (PROCTOZONE-HC) 2.5 % rectal cream Place rectally 2 (two) times daily. 30 g 2  . leflunomide (ARAVA) 20 MG tablet Take 20 mg by mouth daily.    Marland Kitchen lisinopril (PRINIVIL,ZESTRIL) 20 MG tablet Take 1 tablet (20 mg total) by mouth 2 (two) times daily. 60 tablet 3  . metoprolol succinate (TOPROL-XL) 50 MG 24 hr tablet Take 1 tablet (50 mg total) by mouth 2 (two) times daily. 60 tablet 3  . omeprazole (PRILOSEC) 20 MG capsule take 1 capsule by mouth twice a day before meals 60 capsule 5  . Probiotic Product (PROBIOTIC FORMULA) CAPS Take 1 capsule by mouth every other day.      No current facility-administered medications for this visit.    Past Medical History  Diagnosis Date  . Hypertension   . Colon cancer   . RUQ pain   . Rheumatoid arthritis(714.0)   . Cervicalgia   . Degeneration of cervical intervertebral disc   . Pinched nerve in shoulder   . Yeast infection   . Vulvar dystrophy     Past Surgical History  Procedure Laterality Date  . Colonoscopy  04/02/2009  . Colonoscopy  06/04/05  . Hemicolectomy  10/05  .  Cholecystectomy  2002  . Appendectomy  with hysterectomy  . Abdominal hysterectomy  1960'S  . Back surgery  12/08/10    plate/screws C3 C4  . Colonoscopy with esophagogastroduodenoscopy (egd) N/A 04/08/2012    Procedure: COLONOSCOPY WITH ESOPHAGOGASTRODUODENOSCOPY (EGD);  Surgeon: Rogene Houston, MD;  Location: [facility];  Service: Endoscopy;  Laterality: N/A;  130-moved to [place] notified pt    History   Social History  . Marital Status: Married    Spouse Name: N/A  . Number of Children: N/A  . Years of Education: N/A   Occupational History  . Not  on file.   Social History Main Topics  . Smoking status: Never Smoker   . Smokeless tobacco: Never Used  . Alcohol Use: No  . Drug Use: No  . Sexual Activity: No   Other Topics Concern  . Not on file   Social History Narrative     Filed Vitals:   03/22/14 1045  BP: 140/70  Pulse: 72  Height: 5\' 2"  (1.575 m)  Weight: 137 lb 3.2 oz (62.234 kg)  SpO2: 94%    PHYSICAL EXAM General: NAD HEENT: Normal. Neck: No JVD, no thyromegaly. Lungs: Clear to auscultation bilaterally with normal respiratory effort. CV: Nondisplaced PMI.  Regular rate and rhythm, normal S1/S2, no S3/S4, no murmur. No pretibial or periankle edema.  No carotid bruit.  Normal pedal pulses.  Abdomen: Soft, nontender, no hepatosplenomegaly, no distention.  Neurologic: Alert and oriented x 3.  Psych: Normal affect. Skin: Normal. Musculoskeletal: Normal range of motion, no gross deformities. Extremities: No clubbing or cyanosis.   ECG: Most recent ECG reviewed.      ASSESSMENT AND PLAN: 1. Paroxysmal atrial fibrillation: Controlled on Toprol-XL 50 mg twice daily. Continue Eliquis for anticoagulation. Will refill. 2. Essential HTN: Reasonably controlled for age on present therapy which includes lisinopril 20 mg daily. No changes.  Dispo: f/u 6 months.   Kate Sable, M.D., F.A.C.C.

## 2014-03-22 NOTE — Patient Instructions (Signed)
Your physician wants you to follow-up in: 6 months with Dr. Bronson Ing. You will receive a reminder letter in the mail two months in advance. If you don't receive a letter, please call our office to schedule the follow-up appointment.  Your physician recommends that you continue on your current medications as directed. Please refer to the Current Medication list given to you today.  You have been given samples of Eliquis today   Thank you for choosing [facility]!

## 2014-04-03 DIAGNOSIS — H18411 Arcus senilis, right eye: Secondary | ICD-10-CM | POA: Diagnosis not present

## 2014-04-03 DIAGNOSIS — H3531 Nonexudative age-related macular degeneration: Secondary | ICD-10-CM | POA: Diagnosis not present

## 2014-04-03 DIAGNOSIS — H18412 Arcus senilis, left eye: Secondary | ICD-10-CM | POA: Diagnosis not present

## 2014-04-03 DIAGNOSIS — H02839 Dermatochalasis of unspecified eye, unspecified eyelid: Secondary | ICD-10-CM | POA: Diagnosis not present

## 2014-04-03 DIAGNOSIS — H2512 Age-related nuclear cataract, left eye: Secondary | ICD-10-CM | POA: Diagnosis not present

## 2014-04-04 DIAGNOSIS — E782 Mixed hyperlipidemia: Secondary | ICD-10-CM | POA: Diagnosis not present

## 2014-04-04 DIAGNOSIS — J0100 Acute maxillary sinusitis, unspecified: Secondary | ICD-10-CM | POA: Diagnosis not present

## 2014-04-04 DIAGNOSIS — J209 Acute bronchitis, unspecified: Secondary | ICD-10-CM | POA: Diagnosis not present

## 2014-04-04 DIAGNOSIS — Z6823 Body mass index (BMI) 23.0-23.9, adult: Secondary | ICD-10-CM | POA: Diagnosis not present

## 2014-04-04 DIAGNOSIS — G894 Chronic pain syndrome: Secondary | ICD-10-CM | POA: Diagnosis not present

## 2014-04-23 ENCOUNTER — Encounter (HOSPITAL_COMMUNITY): Payer: Self-pay | Admitting: Cardiology

## 2014-04-23 ENCOUNTER — Emergency Department (HOSPITAL_COMMUNITY): Payer: Medicare Other

## 2014-04-23 ENCOUNTER — Emergency Department (HOSPITAL_COMMUNITY)
Admission: EM | Admit: 2014-04-23 | Discharge: 2014-04-23 | Disposition: A | Discharge status: Home / Self Care | Payer: Medicare Other | Attending: Emergency Medicine | Admitting: Emergency Medicine

## 2014-04-23 DIAGNOSIS — M1990 Unspecified osteoarthritis, unspecified site: Secondary | ICD-10-CM | POA: Insufficient documentation

## 2014-04-23 DIAGNOSIS — Z8619 Personal history of other infectious and parasitic diseases: Secondary | ICD-10-CM | POA: Diagnosis not present

## 2014-04-23 DIAGNOSIS — J984 Other disorders of lung: Secondary | ICD-10-CM | POA: Diagnosis not present

## 2014-04-23 DIAGNOSIS — Z7901 Long term (current) use of anticoagulants: Secondary | ICD-10-CM | POA: Diagnosis not present

## 2014-04-23 DIAGNOSIS — R918 Other nonspecific abnormal finding of lung field: Secondary | ICD-10-CM | POA: Diagnosis not present

## 2014-04-23 DIAGNOSIS — R079 Chest pain, unspecified: Secondary | ICD-10-CM | POA: Diagnosis not present

## 2014-04-23 DIAGNOSIS — Z85038 Personal history of other malignant neoplasm of large intestine: Secondary | ICD-10-CM | POA: Insufficient documentation

## 2014-04-23 DIAGNOSIS — Z8669 Personal history of other diseases of the nervous system and sense organs: Secondary | ICD-10-CM | POA: Diagnosis not present

## 2014-04-23 DIAGNOSIS — Z7952 Long term (current) use of systemic steroids: Secondary | ICD-10-CM | POA: Diagnosis not present

## 2014-04-23 DIAGNOSIS — Z79899 Other long term (current) drug therapy: Secondary | ICD-10-CM | POA: Diagnosis not present

## 2014-04-23 DIAGNOSIS — R0602 Shortness of breath: Secondary | ICD-10-CM | POA: Insufficient documentation

## 2014-04-23 DIAGNOSIS — R630 Anorexia: Secondary | ICD-10-CM | POA: Diagnosis not present

## 2014-04-23 DIAGNOSIS — I10 Essential (primary) hypertension: Secondary | ICD-10-CM | POA: Diagnosis not present

## 2014-04-23 DIAGNOSIS — Z6822 Body mass index (BMI) 22.0-22.9, adult: Secondary | ICD-10-CM | POA: Diagnosis not present

## 2014-04-23 DIAGNOSIS — J479 Bronchiectasis, uncomplicated: Secondary | ICD-10-CM | POA: Diagnosis not present

## 2014-04-23 DIAGNOSIS — J069 Acute upper respiratory infection, unspecified: Secondary | ICD-10-CM | POA: Diagnosis not present

## 2014-04-23 LAB — CBC WITH DIFFERENTIAL/PLATELET
BASOS ABS: 0.1 10*3/uL (ref 0.0–0.1)
BASOS PCT: 2 % — AB (ref 0–1)
EOS ABS: 0.2 10*3/uL (ref 0.0–0.7)
EOS PCT: 4 % (ref 0–5)
HEMATOCRIT: 40.1 % (ref 36.0–46.0)
Hemoglobin: 13.2 g/dL (ref 12.0–15.0)
LYMPHS PCT: 20 % (ref 12–46)
Lymphs Abs: 1.1 10*3/uL (ref 0.7–4.0)
MCH: 29.5 pg (ref 26.0–34.0)
MCHC: 32.9 g/dL (ref 30.0–36.0)
MCV: 89.5 fL (ref 78.0–100.0)
MONO ABS: 0.9 10*3/uL (ref 0.1–1.0)
Monocytes Relative: 16 % — ABNORMAL HIGH (ref 3–12)
Neutro Abs: 3.3 10*3/uL (ref 1.7–7.7)
Neutrophils Relative %: 58 % (ref 43–77)
PLATELETS: 213 10*3/uL (ref 150–400)
RBC: 4.48 MIL/uL (ref 3.87–5.11)
RDW: 14.4 % (ref 11.5–15.5)
WBC: 5.6 10*3/uL (ref 4.0–10.5)

## 2014-04-23 LAB — D-DIMER, QUANTITATIVE: D-Dimer, Quant: 0.67 ug/mL-FEU — ABNORMAL HIGH (ref 0.00–0.48)

## 2014-04-23 LAB — URINALYSIS, ROUTINE W REFLEX MICROSCOPIC
Bilirubin Urine: NEGATIVE
GLUCOSE, UA: NEGATIVE mg/dL
Hgb urine dipstick: NEGATIVE
Ketones, ur: NEGATIVE mg/dL
LEUKOCYTES UA: NEGATIVE
Nitrite: NEGATIVE
PH: 5.5 (ref 5.0–8.0)
Protein, ur: NEGATIVE mg/dL
Specific Gravity, Urine: 1.02 (ref 1.005–1.030)
Urobilinogen, UA: 0.2 mg/dL (ref 0.0–1.0)

## 2014-04-23 LAB — COMPREHENSIVE METABOLIC PANEL
ALT: 13 U/L (ref 0–35)
ANION GAP: 10 (ref 5–15)
AST: 19 U/L (ref 0–37)
Albumin: 3.5 g/dL (ref 3.5–5.2)
Alkaline Phosphatase: 60 U/L (ref 39–117)
BUN: 22 mg/dL (ref 6–23)
CALCIUM: 9 mg/dL (ref 8.4–10.5)
CO2: 26 mmol/L (ref 19–32)
CREATININE: 0.89 mg/dL (ref 0.50–1.10)
Chloride: 103 mmol/L (ref 96–112)
GFR calc Af Amer: 70 mL/min — ABNORMAL LOW (ref >90)
GFR calc non Af Amer: 60 mL/min — ABNORMAL LOW (ref >90)
GLUCOSE: 114 mg/dL — AB (ref 70–99)
Potassium: 4 mmol/L (ref 3.5–5.1)
Sodium: 139 mmol/L (ref 135–145)
Total Bilirubin: 0.5 mg/dL (ref 0.3–1.2)
Total Protein: 6.8 g/dL (ref 6.0–8.3)

## 2014-04-23 LAB — BRAIN NATRIURETIC PEPTIDE: B Natriuretic Peptide: 83 pg/mL (ref 0.0–100.0)

## 2014-04-23 LAB — TROPONIN I: Troponin I: <0.03 ng/mL (ref <0.031)

## 2014-04-23 MED ORDER — TECHNETIUM TC 99M DIETHYLENETRIAME-PENTAACETIC ACID
40.0000 | Freq: Once | INTRAVENOUS | Status: AC | PRN
  Administered 2014-04-23: 40 via INTRAVENOUS

## 2014-04-23 MED ORDER — TECHNETIUM TO 99M ALBUMIN AGGREGATED
6.0000 | Freq: Once | INTRAVENOUS | Status: AC | PRN
  Administered 2014-04-23: 6 via INTRAVENOUS

## 2014-04-23 NOTE — ED Provider Notes (Signed)
  Physical Exam  BP 134/74 mmHg  Pulse 78  Temp(Src) 98.2 F (36.8 C) (Oral)  Resp 13  Ht 5\' 2"  (1.575 m)  Wt 122 lb (55.339 kg)  BMI 22.31 kg/m2  SpO2 93%  Physical Exam  ED Course  Procedures  MDM CT scan and VQ scan reviewed. VQ scan showed no PE. CT scan did show one small area of atelectasis versus pneumonia. No white count and no change in her sputum despite increased shortness of breath. Will discharge home to follow-up with her PCP.      Davonna Belling, MD 04/23/14 [phone]

## 2014-04-23 NOTE — ED Provider Notes (Signed)
CSN: [id number]     Arrival date & time 04/23/14  1106 History  This chart was scribed for Cassandra Hacker, MD by Donato Schultz, ED Scribe. This patient was seen in room [facility] and the patient's care was started at 11:37 AM.    Chief Complaint  Patient presents with  . Shortness of Breath   HPI HPI Comments: [name] is a 81 y.o. female who presents to the Emergency Department complaining of weight loss, pain across her shoulder blades, fullness in her chest, and SOB that started three weeks ago.  She has been eating a lot of yogurt and Ensure but states that she has to force herself to eat.  Talking aggravates her SOB and she does not feel strong enough to walk.  She was complaining of constant cough and chest congestion 3 weeks ago and went to see Dr. Gerarda Fraction who treated her with Amoxicillin.  She started vomiting two days after she started taking the Amoxicillin and was switched to Levaquin.  She stopped taking Levaquin before she was supposed to due to diarrhea.  She called Dr. Nolon Rod office this morning and was told to report to the ED for further evaluation.  She takes anticoagulants and blood pressure medication regularly.  She denies abdominal pain, leg swelling, fever, chills, nausea, and vomiting as associated symptoms.    Past Medical History  Diagnosis Date  . Hypertension   . Colon cancer   . RUQ pain   . Rheumatoid arthritis(714.0)   . Cervicalgia   . Degeneration of cervical intervertebral disc   . Pinched nerve in shoulder   . Yeast infection   . Vulvar dystrophy    Past Surgical History  Procedure Laterality Date  . Colonoscopy  04/02/2009  . Colonoscopy  06/04/05  . Hemicolectomy  10/05  . Cholecystectomy  2002  . Appendectomy  with hysterectomy  . Abdominal hysterectomy  1960'S  . Back surgery  12/08/10    plate/screws C3 C4  . Colonoscopy with esophagogastroduodenoscopy (egd) N/A 04/08/2012    Procedure: COLONOSCOPY WITH ESOPHAGOGASTRODUODENOSCOPY  (EGD);  Surgeon: Rogene Houston, MD;  Location: [facility];  Service: Endoscopy;  Laterality: N/A;  130-moved to 17 [place] notified pt   Family History  Problem Relation Age of Onset  . Rheum arthritis Mother   . Parkinsonism Father   . Hiatal hernia Sister   . Healthy Brother   . Healthy Daughter   . Healthy Son   . Healthy Son    History  Substance Use Topics  . Smoking status: Never Smoker   . Smokeless tobacco: Never Used  . Alcohol Use: No   OB History    Gravida Para Term Preterm AB TAB SAB Ectopic Multiple Living   4 4        4      Review of Systems  Constitutional: Positive for appetite change and unexpected weight change. Negative for fever.  Respiratory: Positive for cough and shortness of breath. Negative for chest tightness.   Cardiovascular: Negative for chest pain.  Gastrointestinal: Negative for nausea, vomiting and abdominal pain.  Genitourinary: Negative for dysuria.  Musculoskeletal: Negative for back pain.  Neurological: Negative for headaches.  Psychiatric/Behavioral: Negative for confusion.  All other systems reviewed and are negative.     Allergies  Iodinated diagnostic agents; Nerve block tray; Nitrofuran derivatives; and Triamcinolone acetonide  Home Medications   Prior to Admission medications   Medication Sig Start Date End Date Taking? Authorizing Provider  amLODipine (NORVASC)  5 MG tablet Take 1 tablet (5 mg total) by mouth daily. 12/26/13  Yes Herminio Commons, MD  apixaban (ELIQUIS) 5 MG TABS tablet Take 1 tablet (5 mg total) by mouth 2 (two) times daily. 09/04/13  Yes Lendon Colonel, NP  doxycycline (VIBRAMYCIN) 50 MG capsule Take 50 mg by mouth 2 (two) times daily. For 12 days . Started 4/7   Yes Historical Provider, MD  ergocalciferol (VITAMIN D2) 50000 UNITS capsule Take 50,000 Units by mouth once a week. Takes on Thursday   Yes Historical Provider, MD  fluticasone (FLONASE) 50 MCG/ACT nasal spray Place 1 spray into both  nostrils daily.   Yes Historical Provider, MD  HYDROcodone-acetaminophen (VICODIN) 5-500 MG per tablet Take 1 tablet by mouth at bedtime as needed for pain.    Yes Historical Provider, MD  leflunomide (ARAVA) 20 MG tablet Take 20 mg by mouth daily.   Yes Historical Provider, MD  lisinopril (PRINIVIL,ZESTRIL) 20 MG tablet Take 1 tablet (20 mg total) by mouth 2 (two) times daily. 03/12/14  Yes Herminio Commons, MD  Loteprednol-Tobramycin 0.5-0.3 % SUSP Place 1 drop into both eyes 3 (three) times daily.   Yes Historical Provider, MD  metoprolol succinate (TOPROL-XL) 50 MG 24 hr tablet Take 1 tablet (50 mg total) by mouth 2 (two) times daily. 03/12/14  Yes Herminio Commons, MD  omeprazole (PRILOSEC) 20 MG capsule take 1 capsule by mouth twice a day before meals 12/18/13  Yes Rogene Houston, MD  hydrocortisone (PROCTOZONE-HC) 2.5 % rectal cream Place rectally 2 (two) times daily. Patient not taking: Reported on 04/23/2014 03/30/13   Jonnie Kind, MD   BP 98/57 mmHg  Pulse 77  Temp(Src) 98.2 F (36.8 C) (Oral)  Resp 20  Ht 5\' 2"  (1.575 m)  Wt 122 lb (55.339 kg)  BMI 22.31 kg/m2  SpO2 89% Physical Exam  Constitutional: She is oriented to person, place, and time. No distress.  Elderly, no acute distress, thin  HENT:  Head: Normocephalic and atraumatic.  Eyes: Pupils are equal, round, and reactive to light.  Cardiovascular: Normal rate, regular rhythm and normal heart sounds.   Pulmonary/Chest: Effort normal and breath sounds normal. No respiratory distress. She has no wheezes.  Tachypnea, speaking in short sentences  Abdominal: Soft. Bowel sounds are normal. There is no tenderness. There is no rebound.  Neurological: She is alert and oriented to person, place, and time.  Skin: Skin is warm and dry.  Psychiatric: She has a normal mood and affect.  Nursing note and vitals reviewed.   ED Course  Procedures (including critical care time) Labs Review Labs Reviewed  CBC WITH  DIFFERENTIAL/PLATELET - Abnormal; Notable for the following:    Monocytes Relative 16 (*)    Basophils Relative 2 (*)    All other components within normal limits  COMPREHENSIVE METABOLIC PANEL - Abnormal; Notable for the following:    Glucose, Bld 114 (*)    GFR calc non Af Amer 60 (*)    GFR calc Af Amer 70 (*)    All other components within normal limits  D-DIMER, QUANTITATIVE - Abnormal; Notable for the following:    D-Dimer, Quant 0.67 (*)    All other components within normal limits  URINALYSIS, ROUTINE W REFLEX MICROSCOPIC  TROPONIN I  BRAIN NATRIURETIC PEPTIDE    Imaging Review Dg Chest Portable 1 View  04/23/2014   CLINICAL DATA:  Shortness of Breath and chest heaviness for 12 days  EXAM: PORTABLE CHEST -  1 VIEW  COMPARISON:  Chest radiograph August 24, 2008; chest CT August 25, 2008  FINDINGS: There is no edema or consolidation. Heart size and pulmonary vascularity are normal. No adenopathy. There is atherosclerotic change in the aorta. There is elevation of the right hemidiaphragm. No bone lesions.  IMPRESSION: No edema or consolidation.  Stable elevation right hemidiaphragm.   Electronically Signed   By: Lowella Grip III M.D.   On: 04/23/2014 12:41     EKG Interpretation   Date/Time:  Monday April 23 2014 12:45:37 EDT Ventricular Rate:  70 PR Interval:  139 QRS Duration: 85 QT Interval:  401 QTC Calculation: 433 R Axis:   -56 Text Interpretation:  Sinus rhythm Left anterior fascicular block Abnrm T,  consider ischemia, anterolateral lds No significant change since last  tracing Confirmed by HORTON  MD, COURTNEY ([id number]) on 04/23/2014 12:49:14 PM      MDM   Final diagnoses:  SOB (shortness of breath)    Patient presents with progressive shortness of breath and weight loss.  Noted to be tachypnea and speaking in short sentences on exam. Oxygen saturation low 90s. EKG reassuring. Chest x-ray without evidence of consolidation or pneumonia. D-dimer by age is  negative; however at this time and do not have a good etiology for her shortness of breath. Will obtain VQ scan. Patient cannot get contrast with CT scan; however, given weight loss cancer versus metastasis would also be a consideration. These imaging tests are pending at the end of my shift. Patient has already ambulated and maintain a pulse ox of greater than 92%.  Signed out to Dr. Alvino Chapel  I personally performed the services described in this documentation, which was scribed in my presence. The recorded information has been reviewed and is accurate.    Cassandra Hacker, MD 04/24/14 [phone]

## 2014-04-23 NOTE — Discharge Instructions (Signed)

## 2014-04-23 NOTE — ED Notes (Signed)
Sob for a few weeks.  Sent here by Dr. Gerarda Fraction this morning

## 2014-05-04 DIAGNOSIS — M150 Primary generalized (osteo)arthritis: Secondary | ICD-10-CM | POA: Diagnosis not present

## 2014-05-04 DIAGNOSIS — M0589 Other rheumatoid arthritis with rheumatoid factor of multiple sites: Secondary | ICD-10-CM | POA: Diagnosis not present

## 2014-05-04 DIAGNOSIS — M5136 Other intervertebral disc degeneration, lumbar region: Secondary | ICD-10-CM | POA: Diagnosis not present

## 2014-05-04 DIAGNOSIS — E559 Vitamin D deficiency, unspecified: Secondary | ICD-10-CM | POA: Diagnosis not present

## 2014-05-09 DIAGNOSIS — H01009 Unspecified blepharitis unspecified eye, unspecified eyelid: Secondary | ICD-10-CM | POA: Diagnosis not present

## 2014-05-09 DIAGNOSIS — H16223 Keratoconjunctivitis sicca, not specified as Sjogren's, bilateral: Secondary | ICD-10-CM | POA: Diagnosis not present

## 2014-05-16 ENCOUNTER — Telehealth: Payer: Self-pay | Admitting: Adult Health

## 2014-05-16 NOTE — Telephone Encounter (Signed)
I have 2 boxes I gave pt,signed out in drug sample book

## 2014-05-16 NOTE — Telephone Encounter (Signed)
Patient walked in requesting Eliquis samples

## 2014-05-21 ENCOUNTER — Ambulatory Visit: Payer: Self-pay | Admitting: Obstetrics and Gynecology

## 2014-05-23 ENCOUNTER — Encounter: Payer: Self-pay | Admitting: Obstetrics and Gynecology

## 2014-05-23 ENCOUNTER — Ambulatory Visit (INDEPENDENT_AMBULATORY_CARE_PROVIDER_SITE_OTHER): Payer: Medicare Other | Admitting: Obstetrics and Gynecology

## 2014-05-23 VITALS — BP 120/70 | Ht 62.0 in | Wt 131.0 lb

## 2014-05-23 DIAGNOSIS — Z0000 Encounter for general adult medical examination without abnormal findings: Secondary | ICD-10-CM

## 2014-05-23 DIAGNOSIS — N904 Leukoplakia of vulva: Secondary | ICD-10-CM | POA: Insufficient documentation

## 2014-05-23 DIAGNOSIS — Z01419 Encounter for gynecological examination (general) (routine) without abnormal findings: Secondary | ICD-10-CM

## 2014-05-23 NOTE — Progress Notes (Signed)
Patient ID: [name], female   DOB: Aug 20, 1933, 81 y.o.   MRN: [id number] Pt states that she is here today for her 2 year check up.

## 2014-05-23 NOTE — Progress Notes (Signed)
Patient ID: [name], female   DOB: 1933-07-27, 81 y.o.   MRN: [id number]   Assessment:  Annual Gyn Exam  chronic atrophic vulvar dystrophy, stable Plan:  1. pap smear not done; history of abdominal hysterectomy 2. return annually or prn 3    Annual mammogram advised Subjective:  [name] is a 81 y.o. female G4P4 who presents for her 2 year exam. No LMP recorded. Patient has had a hysterectomy. The patient has complaints today of some burning irritation around her rectum and vagina that is exacerbated whenever she has a BM. She has been applying copious amounts of Vaseline with only partial relief.   She states she is unsure who her PCP is, as she sees several specialists for various problems associated with aging.   The following portions of the patient's history were reviewed and updated as appropriate: allergies, current medications, past family history, past medical history, past social history, past surgical history and problem list. Past Medical History  Diagnosis Date  . Hypertension   . Colon cancer   . RUQ pain   . Rheumatoid arthritis(714.0)   . Cervicalgia   . Degeneration of cervical intervertebral disc   . Pinched nerve in shoulder   . Yeast infection   . Vulvar dystrophy     Past Surgical History  Procedure Laterality Date  . Colonoscopy  04/02/2009  . Colonoscopy  06/04/05  . Hemicolectomy  10/05  . Cholecystectomy  2002  . Appendectomy  with hysterectomy  . Abdominal hysterectomy  1960'S  . Back surgery  12/08/10    plate/screws C3 C4  . Colonoscopy with esophagogastroduodenoscopy (egd) N/A 04/08/2012    Procedure: COLONOSCOPY WITH ESOPHAGOGASTRODUODENOSCOPY (EGD);  Surgeon: Rogene Houston, MD;  [facility];  Service: Endoscopy;  Laterality: N/A;  130-moved to [address] [place] notified pt     Current outpatient prescriptions:  .  amLODipine (NORVASC) 5 MG tablet, Take 1 tablet (5 mg total) by mouth daily., Disp: 90 tablet, Rfl: 3 .   apixaban (ELIQUIS) 5 MG TABS tablet, Take 1 tablet (5 mg total) by mouth 2 (two) times daily., Disp: 60 tablet, Rfl: 8 .  doxycycline (VIBRAMYCIN) 50 MG capsule, Take 50 mg by mouth 2 (two) times daily. For 12 days . Started 4/7, Disp: , Rfl:  .  ergocalciferol (VITAMIN D2) 50000 UNITS capsule, Take 50,000 Units by mouth once a week. Takes on Thursday, Disp: , Rfl:  .  fluticasone (FLONASE) 50 MCG/ACT nasal spray, Place 1 spray into both nostrils daily., Disp: , Rfl:  .  HYDROcodone-acetaminophen (VICODIN) 5-500 MG per tablet, Take 1 tablet by mouth at bedtime as needed for pain. , Disp: , Rfl:  .  hydrocortisone (PROCTOZONE-HC) 2.5 % rectal cream, Place rectally 2 (two) times daily., Disp: 30 g, Rfl: 2 .  leflunomide (ARAVA) 20 MG tablet, Take 20 mg by mouth daily., Disp: , Rfl:  .  lisinopril (PRINIVIL,ZESTRIL) 20 MG tablet, Take 1 tablet (20 mg total) by mouth 2 (two) times daily., Disp: 60 tablet, Rfl: 3 .  Loteprednol-Tobramycin 0.5-0.3 % SUSP, Place 1 drop into both eyes 3 (three) times daily., Disp: , Rfl:  .  metoprolol succinate (TOPROL-XL) 50 MG 24 hr tablet, Take 1 tablet (50 mg total) by mouth 2 (two) times daily., Disp: 60 tablet, Rfl: 3 .  omeprazole (PRILOSEC) 20 MG capsule, take 1 capsule by mouth twice a day before meals, Disp: 60 capsule, Rfl: 5  Review of Systems  Constitutional: negative Gastrointestinal: negative  except for pain around the rectum and vagina Genitourinary: negative A complete 10 system review of systems was obtained and all systems are negative except as noted in the HPI and PMH.    Objective:  BP 120/70 mmHg  Ht 5\' 2"  (1.575 m)  Wt 131 lb (59.421 kg)  BMI 23.95 kg/m2   BMI: Body mass index is 23.95 kg/(m^2).  General Appearance: Alert, appropriate appearance for age. No acute distress HEENT: Grossly normal Neck / Thyroid:  Cardiovascular: RRR; normal S1, S2, no murmur Lungs: CTA bilaterally Back: No CVAT Breast Exam: No dimpling, nipple retraction  or discharge. No masses or nodes., Normal to inspection, Normal breast tissue bilaterally and No masses or nodes.No dimpling, nipple retraction or discharge. Gastrointestinal: Soft, non-tender, no masses or organomegaly Pelvic Exam: External genitalia: atrophic labia majora and significantly atropic labia minora Urinary system: urethral meatus normal Vaginal: atrophic mucosa Cervix: surgically absent Adnexa: normal bimanual exam Uterus: surgically absent Rectovaginal: not indicated Lymphatic Exam: Non-palpable nodes in neck, clavicular, axillary, or inguinal regions Skin: no rash or abnormalities Neurologic: Normal gait and speech, no tremor  Psychiatric: Alert and oriented, appropriate affect.  Urinalysis:Not done  Mallory Shirk. MD Pgr [phone] 3:52 PM  I personally performed the services described in this documentation, which was SCRIBED in my presence. The recorded information has been reviewed and considered accurate. It has been edited as necessary during review. Jonnie Kind, MD

## 2014-05-28 DIAGNOSIS — N952 Postmenopausal atrophic vaginitis: Secondary | ICD-10-CM | POA: Diagnosis not present

## 2014-05-28 DIAGNOSIS — Z6822 Body mass index (BMI) 22.0-22.9, adult: Secondary | ICD-10-CM | POA: Diagnosis not present

## 2014-05-28 DIAGNOSIS — L309 Dermatitis, unspecified: Secondary | ICD-10-CM | POA: Diagnosis not present

## 2014-06-04 ENCOUNTER — Telehealth: Payer: Self-pay | Admitting: Cardiovascular Disease

## 2014-06-04 NOTE — Telephone Encounter (Signed)
Patient requesting samples of Eliquis / tg

## 2014-06-04 NOTE — Telephone Encounter (Signed)
Patient notified that samples are ready for pick up.

## 2014-06-12 ENCOUNTER — Other Ambulatory Visit (INDEPENDENT_AMBULATORY_CARE_PROVIDER_SITE_OTHER): Payer: Self-pay | Admitting: Internal Medicine

## 2014-06-13 DIAGNOSIS — H16223 Keratoconjunctivitis sicca, not specified as Sjogren's, bilateral: Secondary | ICD-10-CM | POA: Diagnosis not present

## 2014-06-13 DIAGNOSIS — H01009 Unspecified blepharitis unspecified eye, unspecified eyelid: Secondary | ICD-10-CM | POA: Diagnosis not present

## 2014-06-19 DIAGNOSIS — Z6822 Body mass index (BMI) 22.0-22.9, adult: Secondary | ICD-10-CM | POA: Diagnosis not present

## 2014-06-19 DIAGNOSIS — Z0000 Encounter for general adult medical examination without abnormal findings: Secondary | ICD-10-CM | POA: Diagnosis not present

## 2014-06-19 DIAGNOSIS — Z1389 Encounter for screening for other disorder: Secondary | ICD-10-CM | POA: Diagnosis not present

## 2014-06-19 DIAGNOSIS — E782 Mixed hyperlipidemia: Secondary | ICD-10-CM | POA: Diagnosis not present

## 2014-06-19 DIAGNOSIS — I4891 Unspecified atrial fibrillation: Secondary | ICD-10-CM | POA: Diagnosis not present

## 2014-06-19 DIAGNOSIS — Z23 Encounter for immunization: Secondary | ICD-10-CM | POA: Diagnosis not present

## 2014-07-11 ENCOUNTER — Telehealth: Payer: Self-pay | Admitting: Cardiovascular Disease

## 2014-07-11 NOTE — Telephone Encounter (Signed)
pls call concerning pt's application for Eliquis

## 2014-07-11 NOTE — Telephone Encounter (Signed)
Spoke with PT to let her know

## 2014-07-12 ENCOUNTER — Telehealth: Payer: Self-pay

## 2014-07-12 NOTE — Telephone Encounter (Signed)
Pt approved for free eliquis through 01/12/15 await med to be mailed to office,pt line busy,unable to notify her

## 2014-07-17 ENCOUNTER — Telehealth: Payer: Self-pay

## 2014-07-17 ENCOUNTER — Telehealth: Payer: Self-pay | Admitting: Cardiovascular Disease

## 2014-07-17 NOTE — Telephone Encounter (Signed)
Pt will no longer be able to afford her Eliquis and would like to know if there is something else she can do

## 2014-07-17 NOTE — Telephone Encounter (Signed)
Pt qualified for free Eliquis,await shipment to office,pt finally contacted

## 2014-07-17 NOTE — Telephone Encounter (Signed)
PATIENT RECEIVED 90 DAYS OF ELIQUIS , PATIENT NOTIFIED

## 2014-07-18 ENCOUNTER — Other Ambulatory Visit: Payer: Self-pay | Admitting: *Deleted

## 2014-07-18 ENCOUNTER — Other Ambulatory Visit: Payer: Self-pay

## 2014-07-18 MED ORDER — METOPROLOL SUCCINATE ER 50 MG PO TB24
50.0000 mg | ORAL_TABLET | Freq: Two times a day (BID) | ORAL | Status: DC
Start: 2014-07-18 — End: 2015-03-22

## 2014-07-18 MED ORDER — LISINOPRIL 20 MG PO TABS: 20.0000 mg | ORAL_TABLET | Freq: Two times a day (BID) | ORAL | Status: DC

## 2014-07-18 NOTE — Telephone Encounter (Signed)
Refill complete 

## 2014-09-26 DIAGNOSIS — Z23 Encounter for immunization: Secondary | ICD-10-CM | POA: Diagnosis not present

## 2014-09-27 ENCOUNTER — Other Ambulatory Visit: Payer: Self-pay

## 2014-09-27 MED ORDER — AMLODIPINE BESYLATE 5 MG PO TABS: 5.0000 mg | ORAL_TABLET | Freq: Every day | ORAL | Status: DC

## 2014-10-08 DIAGNOSIS — E559 Vitamin D deficiency, unspecified: Secondary | ICD-10-CM | POA: Diagnosis not present

## 2014-10-08 DIAGNOSIS — M0589 Other rheumatoid arthritis with rheumatoid factor of multiple sites: Secondary | ICD-10-CM | POA: Diagnosis not present

## 2014-10-08 DIAGNOSIS — M5136 Other intervertebral disc degeneration, lumbar region: Secondary | ICD-10-CM | POA: Diagnosis not present

## 2014-10-08 DIAGNOSIS — M150 Primary generalized (osteo)arthritis: Secondary | ICD-10-CM | POA: Diagnosis not present

## 2014-10-09 ENCOUNTER — Telehealth: Payer: Self-pay

## 2014-10-09 NOTE — Telephone Encounter (Signed)
90 day supply Eliquis delivered today for pt aware

## 2014-10-11 ENCOUNTER — Ambulatory Visit (INDEPENDENT_AMBULATORY_CARE_PROVIDER_SITE_OTHER): Payer: Medicare Other | Admitting: Cardiovascular Disease

## 2014-10-11 ENCOUNTER — Encounter: Payer: Self-pay | Admitting: Cardiovascular Disease

## 2014-10-11 VITALS — BP 122/62 | HR 78 | Ht 63.0 in | Wt 132.0 lb

## 2014-10-11 DIAGNOSIS — I10 Essential (primary) hypertension: Secondary | ICD-10-CM

## 2014-10-11 DIAGNOSIS — I480 Paroxysmal atrial fibrillation: Secondary | ICD-10-CM | POA: Diagnosis not present

## 2014-10-11 NOTE — Patient Instructions (Signed)
Your physician wants you to follow-up in: 1 year with Dr Koneswaran You will receive a reminder letter in the mail two months in advance. If you don't receive a letter, please call our office to schedule the follow-up appointment.    Your physician recommends that you continue on your current medications as directed. Please refer to the Current Medication list given to you today.     Thank you for choosing [facility] !        

## 2014-10-11 NOTE — Progress Notes (Signed)
Patient ID: [name], female   DOB: 26-Oct-1933, 81 y.o.   MRN: [id number]      SUBJECTIVE: The patient is here to follow up for paroxysmal atrial fibrillation and hypertension. She is on apixaban for anticoagulation. She is feeling very well. She has palpitations 2-3 times per month but each episode is short lived. Walks 1.5 miles daily.   Review of Systems: As per "subjective", otherwise negative.  Allergies  Allergen Reactions  . Iodinated Diagnostic Agents Anaphylaxis    08-12-10 Pt had severe reaction with itching/hives/loss of consciousness within 5 minutes of CEPI today.  She received both Kenalog and Iondinated Contrast, so not certain which agent caused reaction.  If patient returns for another CEPI, Dr. Jobe Igo considers using Decadron and a 13-hour prep.  jkl  . Nerve Block Tray Anaphylaxis    Patient states that she was sent to have Epidural for her shoulder. After rec'ving she remembers saying that she was itching all over  And that's it. She was transported to hospital  By EMS. Stayed overnight.  Bufford Spikes Derivatives Hives and Nausea And Vomiting  . Triamcinolone Acetonide Anaphylaxis    08-12-10  Pt had severe reaction with itching/hives/loss of consciousness within five minutes of CEPI today.  She received both Kenalog and Iodinated Contrast, so not certain which agent caused reaction.  If pt returns for another cervical epidural steroid injection, Dr. Jobe Igo suggests using Decadron and a 13-hr prep.  JKL PATIENT USES PROCTOCREAM 2.5% WITHOUT DIFFICULTY FOR VULVAR DYSTROPHY    Current Outpatient Prescriptions  Medication Sig Dispense Refill  . amLODipine (NORVASC) 5 MG tablet Take 1 tablet (5 mg total) by mouth daily. 90 tablet 3  . apixaban (ELIQUIS) 5 MG TABS tablet Take 1 tablet (5 mg total) by mouth 2 (two) times daily. 60 tablet 8  . ergocalciferol (VITAMIN D2) 50000 UNITS capsule Take 50,000 Units by mouth once a week. Takes on Thursday    .  HYDROcodone-acetaminophen (VICODIN) 5-500 MG per tablet Take 1 tablet by mouth at bedtime as needed for pain.     Marland Kitchen leflunomide (ARAVA) 20 MG tablet Take 20 mg by mouth daily.    Marland Kitchen lisinopril (PRINIVIL,ZESTRIL) 20 MG tablet Take 1 tablet (20 mg total) by mouth 2 (two) times daily. 60 tablet 6  . metoprolol succinate (TOPROL-XL) 50 MG 24 hr tablet Take 1 tablet (50 mg total) by mouth 2 (two) times daily. 60 tablet 6  . omeprazole (PRILOSEC) 20 MG capsule take 1 capsule by mouth twice a day 60 capsule 5   No current facility-administered medications for this visit.    Past Medical History  Diagnosis Date  . Hypertension   . Colon cancer   . RUQ pain   . Rheumatoid arthritis(714.0)   . Cervicalgia   . Degeneration of cervical intervertebral disc   . Pinched nerve in shoulder   . Yeast infection   . Vulvar dystrophy     Past Surgical History  Procedure Laterality Date  . Colonoscopy  04/02/2009  . Colonoscopy  06/04/05  . Hemicolectomy  10/05  . Cholecystectomy  2002  . Appendectomy  with hysterectomy  . Abdominal hysterectomy  1960'S  . Back surgery  12/08/10    plate/screws C3 C4  . Colonoscopy with esophagogastroduodenoscopy (egd) N/A 04/08/2012    Procedure: COLONOSCOPY WITH ESOPHAGOGASTRODUODENOSCOPY (EGD);  Surgeon: Rogene Houston, MD;  [facility];  Service: Endoscopy;  Laterality: N/A;  130-moved to [place] notified pt    Social  History   Social History  . Marital Status: Married    Spouse Name: N/A  . Number of Children: N/A  . Years of Education: N/A   Occupational History  . Not on file.   Social History Main Topics  . Smoking status: Never Smoker   . Smokeless tobacco: Never Used  . Alcohol Use: No  . Drug Use: No  . Sexual Activity: No   Other Topics Concern  . Not on file   Social History Narrative     Filed Vitals:   10/11/14 1255  BP: 122/62  Pulse: 78  Height: 5\' 3"  (1.6 m)  Weight: 132 lb (59.875 kg)  SpO2: 97%     PHYSICAL EXAM General: NAD HEENT: Normal. Neck: No JVD, no thyromegaly. Lungs: Clear to auscultation bilaterally with normal respiratory effort. CV: Nondisplaced PMI.  Regular rate and rhythm, normal S1/S2, no S3/S4, no murmur. No pretibial or periankle edema.  No carotid bruit.  Normal pedal pulses.  Abdomen: Soft, nontender, no hepatosplenomegaly, no distention.  Neurologic: Alert and oriented x 3.  Psych: Normal affect. Skin: Normal. Musculoskeletal: Normal range of motion, no gross deformities. Extremities: No clubbing or cyanosis.   ECG: Most recent ECG reviewed.      ASSESSMENT AND PLAN: 1. Paroxysmal atrial fibrillation: Symptoms fairly well controlled on Toprol-XL 50 mg twice daily. Continue Eliquis for anticoagulation.   2. Essential HTN: Well controlled on present therapy which includes lisinopril 20 mg daily. No changes.  Dispo: f/u 1 year.  Kate Sable, M.D., F.A.C.C.

## 2014-12-17 ENCOUNTER — Other Ambulatory Visit (HOSPITAL_COMMUNITY): Payer: Self-pay | Admitting: Family Medicine

## 2014-12-17 DIAGNOSIS — Z1231 Encounter for screening mammogram for malignant neoplasm of breast: Secondary | ICD-10-CM

## 2014-12-31 ENCOUNTER — Telehealth: Payer: Self-pay | Admitting: *Deleted

## 2014-12-31 NOTE — Telephone Encounter (Signed)
Patient notified that Eliquis is in office for pick-up  

## 2015-01-11 ENCOUNTER — Telehealth: Payer: Self-pay

## 2015-01-11 NOTE — Telephone Encounter (Signed)
Called pt to see if she had filled out her application for Eliquis patient assistance, she stated that she NEVER received a new application. I agreed to mail her a new one and let her know her she needs to get it  filled out as soon as possible to avoid any delays in getting her medication. Sent out in mail today 01/11/15-LM

## 2015-01-28 ENCOUNTER — Ambulatory Visit (HOSPITAL_COMMUNITY)
Admission: RE | Admit: 2015-01-28 | Discharge: 2015-01-28 | Disposition: A | Discharge status: Home / Self Care | Payer: Medicare Other | Source: Ambulatory Visit | Attending: Family Medicine | Admitting: Family Medicine

## 2015-01-28 DIAGNOSIS — Z1231 Encounter for screening mammogram for malignant neoplasm of breast: Secondary | ICD-10-CM | POA: Diagnosis not present

## 2015-02-01 ENCOUNTER — Telehealth: Payer: Self-pay | Admitting: Cardiovascular Disease

## 2015-02-01 NOTE — Telephone Encounter (Signed)
[name] is calling concerning the pt assistance application

## 2015-02-05 NOTE — Telephone Encounter (Signed)
[place] and spoke with Coralyn Mark who states that a Rx for Eliquis needed to be faxed in order to complete the application process. Rx was faxed.

## 2015-02-07 DIAGNOSIS — M150 Primary generalized (osteo)arthritis: Secondary | ICD-10-CM | POA: Diagnosis not present

## 2015-02-07 DIAGNOSIS — M5136 Other intervertebral disc degeneration, lumbar region: Secondary | ICD-10-CM | POA: Diagnosis not present

## 2015-02-07 DIAGNOSIS — M0589 Other rheumatoid arthritis with rheumatoid factor of multiple sites: Secondary | ICD-10-CM | POA: Diagnosis not present

## 2015-02-07 DIAGNOSIS — E559 Vitamin D deficiency, unspecified: Secondary | ICD-10-CM | POA: Diagnosis not present

## 2015-02-09 ENCOUNTER — Other Ambulatory Visit: Payer: Self-pay | Admitting: Cardiovascular Disease

## 2015-02-20 DIAGNOSIS — J301 Allergic rhinitis due to pollen: Secondary | ICD-10-CM | POA: Diagnosis not present

## 2015-02-20 DIAGNOSIS — Z1389 Encounter for screening for other disorder: Secondary | ICD-10-CM | POA: Diagnosis not present

## 2015-02-20 DIAGNOSIS — Z6823 Body mass index (BMI) 23.0-23.9, adult: Secondary | ICD-10-CM | POA: Diagnosis not present

## 2015-02-20 DIAGNOSIS — J019 Acute sinusitis, unspecified: Secondary | ICD-10-CM | POA: Diagnosis not present

## 2015-03-18 ENCOUNTER — Other Ambulatory Visit: Payer: Self-pay | Admitting: Cardiovascular Disease

## 2015-03-22 ENCOUNTER — Other Ambulatory Visit: Payer: Self-pay | Admitting: Cardiovascular Disease

## 2015-04-19 ENCOUNTER — Other Ambulatory Visit (INDEPENDENT_AMBULATORY_CARE_PROVIDER_SITE_OTHER): Payer: Self-pay | Admitting: Internal Medicine

## 2015-04-22 ENCOUNTER — Other Ambulatory Visit: Payer: Self-pay

## 2015-04-22 MED ORDER — LISINOPRIL 20 MG PO TABS: 20.0000 mg | ORAL_TABLET | Freq: Two times a day (BID) | ORAL | Status: DC

## 2015-04-22 NOTE — Telephone Encounter (Signed)
90 day refill done as requested

## 2015-05-07 DIAGNOSIS — H2512 Age-related nuclear cataract, left eye: Secondary | ICD-10-CM | POA: Diagnosis not present

## 2015-05-07 DIAGNOSIS — H04123 Dry eye syndrome of bilateral lacrimal glands: Secondary | ICD-10-CM | POA: Diagnosis not present

## 2015-05-07 DIAGNOSIS — Z961 Presence of intraocular lens: Secondary | ICD-10-CM | POA: Diagnosis not present

## 2015-05-22 DIAGNOSIS — H2512 Age-related nuclear cataract, left eye: Secondary | ICD-10-CM | POA: Diagnosis not present

## 2015-05-28 DIAGNOSIS — K921 Melena: Secondary | ICD-10-CM | POA: Diagnosis not present

## 2015-05-28 DIAGNOSIS — Z6822 Body mass index (BMI) 22.0-22.9, adult: Secondary | ICD-10-CM | POA: Diagnosis not present

## 2015-05-28 DIAGNOSIS — D509 Iron deficiency anemia, unspecified: Secondary | ICD-10-CM | POA: Diagnosis not present

## 2015-05-28 DIAGNOSIS — Z1389 Encounter for screening for other disorder: Secondary | ICD-10-CM | POA: Diagnosis not present

## 2015-05-31 ENCOUNTER — Encounter (INDEPENDENT_AMBULATORY_CARE_PROVIDER_SITE_OTHER): Payer: Self-pay | Admitting: *Deleted

## 2015-06-06 DIAGNOSIS — Z79899 Other long term (current) drug therapy: Secondary | ICD-10-CM | POA: Diagnosis not present

## 2015-06-06 DIAGNOSIS — M0589 Other rheumatoid arthritis with rheumatoid factor of multiple sites: Secondary | ICD-10-CM | POA: Diagnosis not present

## 2015-06-06 DIAGNOSIS — M5136 Other intervertebral disc degeneration, lumbar region: Secondary | ICD-10-CM | POA: Diagnosis not present

## 2015-06-06 DIAGNOSIS — E559 Vitamin D deficiency, unspecified: Secondary | ICD-10-CM | POA: Diagnosis not present

## 2015-06-06 DIAGNOSIS — M150 Primary generalized (osteo)arthritis: Secondary | ICD-10-CM | POA: Diagnosis not present

## 2015-06-20 ENCOUNTER — Encounter (INDEPENDENT_AMBULATORY_CARE_PROVIDER_SITE_OTHER): Payer: Self-pay

## 2015-06-20 ENCOUNTER — Ambulatory Visit (INDEPENDENT_AMBULATORY_CARE_PROVIDER_SITE_OTHER): Payer: Medicare Other | Admitting: Internal Medicine

## 2015-06-20 ENCOUNTER — Encounter (INDEPENDENT_AMBULATORY_CARE_PROVIDER_SITE_OTHER): Payer: Self-pay | Admitting: Internal Medicine

## 2015-06-20 VITALS — BP 122/56 | HR 72 | Temp 98.4°F | Ht 62.0 in | Wt 129.1 lb

## 2015-06-20 DIAGNOSIS — K921 Melena: Secondary | ICD-10-CM | POA: Diagnosis not present

## 2015-06-20 NOTE — Progress Notes (Signed)
Subjective:    Patient ID: [name], female    DOB: 09-07-1933, 82 y.o.   MRN: [id number]  [facility] (Dr. Sharilyn Sites) for melena in the setting of anticoagulants. She tells me about a month ago she was having black stools. She tells me every once in a while she will have a black stool. She was checked at [facility] and her stool cards x 3 were all negative for blood.  She says her stools are brown in color now.  She has not had a black stool in about a month. Her appetite is fair. She has lost about 2-3 pounds while her husband was in the hospital. He had fallen. Her acid reflux is controlled with Omeprazole.  05/28/2015 H and H 123.3 and 36.2, Platelet ct 245. Hx significant for atrial fib and maintained on Eliquis.     04/08/2012 EGD & Colonoscopy  Indications: Patient is 82 year old Caucasian female with recurrent epigastric pain. She is feeling better since her PPI was changed. She has history of colon carcinoma. She is status post right hemi-colectomy in 2005. Her last colonoscopy was 3 years ago  Impression:  Mild changes of reflux esophagitis limited to GE junction. No evidence of peptic ulcer disease or gastritis. Normal colonoscopy to ileocolonic anastomosis except few small diverticula at sigmoid colon.   Right colectomy she had in October 2005 for colon carcinoma. Last cololnoscopy in 2011: Single small diverticulum at sigmoid colon, otherwise normal.   Review of Systems Past Medical History  Diagnosis Date  . Hypertension   . Colon cancer ([place])   . RUQ pain   . Rheumatoid arthritis(714.0)   . Cervicalgia   . Degeneration of cervical intervertebral disc   . Pinched nerve in shoulder   . Yeast infection   . Vulvar dystrophy     Past Surgical History  Procedure Laterality Date  . Colonoscopy  04/02/2009  .  Colonoscopy  06/04/05  . Hemicolectomy  10/05  . Cholecystectomy  2002  . Appendectomy  with hysterectomy  . Abdominal hysterectomy  1960'S  . Back surgery  12/08/10    plate/screws C3 C4  . Colonoscopy with esophagogastroduodenoscopy (egd) N/A 04/08/2012    Procedure: COLONOSCOPY WITH ESOPHAGOGASTRODUODENOSCOPY (EGD);  Surgeon: Rogene Houston, MD;  [facility];  Service: Endoscopy;  Laterality: N/A;  130-moved to [place] notified pt    Allergies  Allergen Reactions  . Iodinated Diagnostic Agents Anaphylaxis    08-12-10 Pt had severe reaction with itching/hives/loss of consciousness within 5 minutes of CEPI today.  She received both Kenalog and Iondinated Contrast, so not certain which agent caused reaction.  If patient returns for another CEPI, Dr. Jobe Igo considers using Decadron and a 13-hour prep.  jkl  . Nerve Block Tray Anaphylaxis    Patient states that she was sent to have Epidural for her shoulder. After rec'ving she remembers saying that she was itching all over  And that's it. She was transported to hospital  By EMS. Stayed overnight.  Bufford Spikes Derivatives Hives and Nausea And Vomiting  . Triamcinolone Acetonide Anaphylaxis    08-12-10  Pt had severe reaction with itching/hives/loss of consciousness within five minutes of CEPI today.  She received both Kenalog and Iodinated Contrast, so not certain which agent caused reaction.  If pt returns for another cervical epidural steroid injection, Dr. Jobe Igo suggests using Decadron and a 13-hr prep.  JKL PATIENT USES PROCTOCREAM 2.5% WITHOUT DIFFICULTY FOR VULVAR DYSTROPHY    Current  Outpatient Prescriptions on File Prior to Visit  Medication Sig Dispense Refill  . amLODipine (NORVASC) 5 MG tablet Take 1 tablet (5 mg total) by mouth daily. 90 tablet 3  . apixaban (ELIQUIS) 5 MG TABS tablet Take 1 tablet (5 mg total) by mouth 2 (two) times daily. 60 tablet 8  . ergocalciferol (VITAMIN D2) 50000 UNITS capsule Take 50,000  Units by mouth once a week. Takes on Thursday    . leflunomide (ARAVA) 20 MG tablet Take 20 mg by mouth daily.    Marland Kitchen lisinopril (PRINIVIL,ZESTRIL) 20 MG tablet Take 1 tablet (20 mg total) by mouth 2 (two) times daily. 90 tablet 3  . metoprolol succinate (TOPROL-XL) 50 MG 24 hr tablet Take 1 tablet (50 mg total) by mouth 2 (two) times daily. 60 tablet 6  . omeprazole (PRILOSEC) 20 MG capsule take 1 capsule by mouth twice a day 60 capsule 5   No current facility-administered medications on file prior to visit.        Objective:   Physical Exam Blood pressure 122/56, pulse 72, temperature 98.4 F (36.9 C), height 5\' 2"  (1.575 m), weight 129 lb 1.6 oz (58.559 kg). Alert and oriented. Skin warm and dry. Oral mucosa is moist.   . Sclera anicteric, conjunctivae is pink. Thyroid not enlarged. No cervical lymphadenopathy. Lungs clear. Heart regular rate and rhythm.  Abdomen is soft. Bowel sounds are positive. No hepatomegaly. No abdominal masses felt. No tenderness.  No edema to lower extremities.  No stool in rectum and guaiac negative.       Lot [id number] Ex 9/17    Assessment & Plan:  Recent hx of melena. No melena in 4 weeks.  CBC today.  Three stool cards home with patient. I advised her if she had another black stool to come by office and I will check her.

## 2015-06-20 NOTE — Patient Instructions (Addendum)
CBC today.  Three stool cards home with patient.  I advised her if she had another black stool to come by office and I will check her.

## 2015-06-21 LAB — CBC WITH DIFFERENTIAL/PLATELET
BASOS PCT: 1 %
Basophils Absolute: 79 cells/uL (ref 0–200)
Eosinophils Absolute: 316 cells/uL (ref 15–500)
Eosinophils Relative: 4 %
HEMATOCRIT: 35.5 % (ref 35.0–45.0)
Hemoglobin: 11.5 g/dL — ABNORMAL LOW (ref 11.7–15.5)
LYMPHS PCT: 15 %
Lymphs Abs: 1185 cells/uL (ref 850–3900)
MCH: 28.8 pg (ref 27.0–33.0)
MCHC: 32.4 g/dL (ref 32.0–36.0)
MCV: 88.8 fL (ref 80.0–100.0)
MONO ABS: 1027 {cells}/uL — AB (ref 200–950)
MPV: 10 fL (ref 7.5–12.5)
Monocytes Relative: 13 %
NEUTROS ABS: 5293 {cells}/uL (ref 1500–7800)
Neutrophils Relative %: 67 %
Platelets: 232 10*3/uL (ref 140–400)
RBC: 4 MIL/uL (ref 3.80–5.10)
RDW: 14.7 % (ref 11.0–15.0)
WBC: 7.9 10*3/uL (ref 3.8–10.8)

## 2015-06-24 ENCOUNTER — Telehealth (INDEPENDENT_AMBULATORY_CARE_PROVIDER_SITE_OTHER): Payer: Self-pay | Admitting: Internal Medicine

## 2015-06-24 NOTE — Telephone Encounter (Signed)
   Dr. Laural Golden: [name]. I checked her and stool card was negative. Stool was dark. She had 3 stool cards at [facility] and were negative. I sent 3 stool cards home with patient and 2/3 were positive. Hx of colon cancer. Last colonoscopy was in 2014. Is on Eliquis. Hemoglobin 11.5. ? What do we do.

## 2015-06-25 DIAGNOSIS — Z961 Presence of intraocular lens: Secondary | ICD-10-CM | POA: Diagnosis not present

## 2015-07-01 NOTE — Telephone Encounter (Signed)
Would recommend colonoscopy and if negative followed by EGD.

## 2015-07-02 ENCOUNTER — Other Ambulatory Visit (INDEPENDENT_AMBULATORY_CARE_PROVIDER_SITE_OTHER): Payer: Self-pay | Admitting: Internal Medicine

## 2015-07-02 ENCOUNTER — Telehealth (INDEPENDENT_AMBULATORY_CARE_PROVIDER_SITE_OTHER): Payer: Self-pay | Admitting: Internal Medicine

## 2015-07-02 DIAGNOSIS — R195 Other fecal abnormalities: Secondary | ICD-10-CM

## 2015-07-02 NOTE — Telephone Encounter (Signed)
I have sent Lelon Frohlich a message

## 2015-07-02 NOTE — Telephone Encounter (Signed)
[name], Colonoscopy, possible EGD. Heme Positive stool. Patient is on Eliquis.

## 2015-07-03 ENCOUNTER — Encounter (INDEPENDENT_AMBULATORY_CARE_PROVIDER_SITE_OTHER): Payer: Self-pay | Admitting: *Deleted

## 2015-07-03 ENCOUNTER — Other Ambulatory Visit (INDEPENDENT_AMBULATORY_CARE_PROVIDER_SITE_OTHER): Payer: Self-pay | Admitting: *Deleted

## 2015-07-03 ENCOUNTER — Telehealth (INDEPENDENT_AMBULATORY_CARE_PROVIDER_SITE_OTHER): Payer: Self-pay | Admitting: *Deleted

## 2015-07-03 NOTE — Telephone Encounter (Signed)
Patient is scheduled for colonoscopy possible EGD on 07/26/15 and needs to stop Eliquis 2 days prior, please advise if this is ok -- thanks

## 2015-07-03 NOTE — Telephone Encounter (Signed)
Forwarded to Dr Laural Golden for review, patient aware to stop Eliquis

## 2015-07-03 NOTE — Telephone Encounter (Signed)
Patient needs trilyte 

## 2015-07-03 NOTE — Telephone Encounter (Signed)
OK to hold Eliquis 2 days prior to colonoscopy.  Restart Eliquis night of procedure if ok with Dr Laural Golden.

## 2015-07-03 NOTE — Telephone Encounter (Signed)
TCS poss EGD sch'd 07/26/15, patient aware

## 2015-07-04 MED ORDER — PEG 3350-KCL-NA BICARB-NACL 420 G PO SOLR: 4000.0000 mL | Freq: Once | ORAL | Status: DC

## 2015-07-25 ENCOUNTER — Telehealth: Payer: Self-pay | Admitting: *Deleted

## 2015-07-25 NOTE — Telephone Encounter (Signed)
Patient notified that Eliquis is in office to be picked up.

## 2015-07-26 ENCOUNTER — Ambulatory Visit (HOSPITAL_COMMUNITY)
Admission: RE | Admit: 2015-07-26 | Discharge: 2015-07-26 | Disposition: A | Discharge status: Home / Self Care | Payer: Medicare Other | Source: Ambulatory Visit | Attending: Internal Medicine | Admitting: Internal Medicine

## 2015-07-26 ENCOUNTER — Encounter (HOSPITAL_COMMUNITY)
Admission: RE | Disposition: A | Discharge status: Home / Self Care | Payer: Self-pay | Source: Ambulatory Visit | Attending: Internal Medicine

## 2015-07-26 ENCOUNTER — Encounter (HOSPITAL_COMMUNITY): Payer: Self-pay | Admitting: *Deleted

## 2015-07-26 DIAGNOSIS — Z7901 Long term (current) use of anticoagulants: Secondary | ICD-10-CM | POA: Diagnosis not present

## 2015-07-26 DIAGNOSIS — K648 Other hemorrhoids: Secondary | ICD-10-CM | POA: Insufficient documentation

## 2015-07-26 DIAGNOSIS — M069 Rheumatoid arthritis, unspecified: Secondary | ICD-10-CM | POA: Diagnosis not present

## 2015-07-26 DIAGNOSIS — D649 Anemia, unspecified: Secondary | ICD-10-CM | POA: Insufficient documentation

## 2015-07-26 DIAGNOSIS — Z980 Intestinal bypass and anastomosis status: Secondary | ICD-10-CM | POA: Insufficient documentation

## 2015-07-26 DIAGNOSIS — Z85038 Personal history of other malignant neoplasm of large intestine: Secondary | ICD-10-CM | POA: Insufficient documentation

## 2015-07-26 DIAGNOSIS — K921 Melena: Secondary | ICD-10-CM | POA: Insufficient documentation

## 2015-07-26 DIAGNOSIS — I10 Essential (primary) hypertension: Secondary | ICD-10-CM | POA: Insufficient documentation

## 2015-07-26 DIAGNOSIS — R195 Other fecal abnormalities: Secondary | ICD-10-CM | POA: Diagnosis not present

## 2015-07-26 DIAGNOSIS — K5730 Diverticulosis of large intestine without perforation or abscess without bleeding: Secondary | ICD-10-CM | POA: Diagnosis not present

## 2015-07-26 HISTORY — PX: ESOPHAGOGASTRODUODENOSCOPY: SHX5428

## 2015-07-26 HISTORY — PX: COLONOSCOPY: SHX5424

## 2015-07-26 SURGERY — COLONOSCOPY
Anesthesia: Moderate Sedation

## 2015-07-26 MED ORDER — MIDAZOLAM HCL 5 MG/5ML IJ SOLN
INTRAMUSCULAR | Status: DC | PRN
  Administered 2015-07-26: 2 mg via INTRAVENOUS
  Administered 2015-07-26 (×3): 1 mg via INTRAVENOUS

## 2015-07-26 MED ORDER — MEPERIDINE HCL 50 MG/ML IJ SOLN
INTRAMUSCULAR | Status: AC
  Filled 2015-07-26: qty 1

## 2015-07-26 MED ORDER — BUTAMBEN-TETRACAINE-BENZOCAINE 2-2-14 % EX AERO
INHALATION_SPRAY | CUTANEOUS | Status: DC | PRN
  Administered 2015-07-26: 2 via TOPICAL

## 2015-07-26 MED ORDER — SODIUM CHLORIDE 0.9 % IV SOLN
INTRAVENOUS | Status: DC
  Administered 2015-07-26: 12:00:00 via INTRAVENOUS

## 2015-07-26 MED ORDER — MIDAZOLAM HCL 5 MG/5ML IJ SOLN
INTRAMUSCULAR | Status: AC
  Filled 2015-07-26: qty 10

## 2015-07-26 MED ORDER — MEPERIDINE HCL 50 MG/ML IJ SOLN
INTRAMUSCULAR | Status: DC | PRN
  Administered 2015-07-26 (×2): 25 mg via INTRAVENOUS

## 2015-07-26 NOTE — H&P (Signed)
[name] is an 82 y.o. female.   Chief Complaint: Patient is here for EGD and colonoscopy. HPI: Patient is 82 year old Caucasian female who has history of colon carcinoma status post right hemicolectomy 2005 whose last colonoscopy was in March 2014 experienced melena a few weeks ago. She did not experience nausea vomiting abdominal pain. She had 3 Hemoccults and these were negative. When she came to our office she was noted to have mild anemia with hemoglobin of 11 5 g. She had Hemoccults repeated in 203 are positive. She is therefore undergoing diagnostic evaluation. She is on Eliquis cause of paroxysmal atrial fibrillation.  Past Medical History  Diagnosis Date  . Hypertension   . Colon cancer ([place])   . RUQ pain   . Rheumatoid arthritis(714.0)   . Cervicalgia   . Degeneration of cervical intervertebral disc   . Pinched nerve in shoulder   . Yeast infection   . Vulvar dystrophy      History of atrial fibrillation.  Past Surgical History  Procedure Laterality Date  . Colonoscopy  04/02/2009  . Colonoscopy  06/04/05  . Hemicolectomy  10/05  . Cholecystectomy  2002  . Appendectomy  with hysterectomy  . Abdominal hysterectomy  1960'S  . Back surgery  12/08/10    plate/screws C3 C4  . Colonoscopy with esophagogastroduodenoscopy (egd) N/A 04/08/2012    Procedure: COLONOSCOPY WITH ESOPHAGOGASTRODUODENOSCOPY (EGD);  Surgeon: Rogene Houston, MD;  [facility];  Service: Endoscopy;  Laterality: N/A;  130-moved to [place] notified pt    Family History  Problem Relation Age of Onset  . Rheum arthritis Mother   . Parkinsonism Father   . Hiatal hernia Sister   . Healthy Brother   . Healthy Daughter   . Healthy Son   . Healthy Son    Social History:  reports that she has never smoked. She has never used smokeless tobacco. She reports that she does not drink alcohol or use illicit drugs.  Allergies:  Allergies  Allergen Reactions  . Iodinated Diagnostic Agents  Anaphylaxis    08-12-10 Pt had severe reaction with itching/hives/loss of consciousness within 5 minutes of CEPI today.  She received both Kenalog and Iondinated Contrast, so not certain which agent caused reaction.  If patient returns for another CEPI, Dr. Jobe Igo considers using Decadron and a 13-hour prep.  jkl  . Nerve Block Tray Anaphylaxis    Patient states that she was sent to have Epidural for her shoulder. After rec'ving she remembers saying that she was itching all over  And that's it. She was transported to hospital  By EMS. Stayed overnight.  Bufford Spikes Derivatives Hives and Nausea And Vomiting  . Triamcinolone Acetonide Anaphylaxis    08-12-10  Pt had severe reaction with itching/hives/loss of consciousness within five minutes of CEPI today.  She received both Kenalog and Iodinated Contrast, so not certain which agent caused reaction.  If pt returns for another cervical epidural steroid injection, Dr. Jobe Igo suggests using Decadron and a 13-hr prep.  JKL PATIENT USES PROCTOCREAM 2.5% WITHOUT DIFFICULTY FOR VULVAR DYSTROPHY    Medications Prior to Admission  Medication Sig Dispense Refill  . amLODipine (NORVASC) 5 MG tablet Take 1 tablet (5 mg total) by mouth daily. 90 tablet 3  . apixaban (ELIQUIS) 5 MG TABS tablet Take 1 tablet (5 mg total) by mouth 2 (two) times daily. 60 tablet 8  . ergocalciferol (VITAMIN D2) 50000 UNITS capsule Take 50,000 Units by mouth once a week. Takes  on Thursday    . leflunomide (ARAVA) 20 MG tablet Take 20 mg by mouth daily.    Marland Kitchen lisinopril (PRINIVIL,ZESTRIL) 20 MG tablet Take 1 tablet (20 mg total) by mouth 2 (two) times daily. 90 tablet 3  . metoprolol succinate (TOPROL-XL) 50 MG 24 hr tablet Take 1 tablet (50 mg total) by mouth 2 (two) times daily. 60 tablet 6  . Omega-3 Fatty Acids (FISH OIL) 1000 MG CAPS Take by mouth.    Marland Kitchen omeprazole (PRILOSEC) 20 MG capsule take 1 capsule by mouth twice a day 60 capsule 5  . polyethylene glycol-electrolytes  (TRILYTE) 420 g solution Take 4,000 mLs by mouth once. 4000 mL 0    No results found for this or any previous visit (from the past 48 hour(s)). No results found.  ROS  Blood pressure 130/73, pulse 64, temperature 98.1 F (36.7 C), temperature source Oral, resp. rate 23, height 5' 2.45" (1.586 m), weight 125 lb (56.7 kg), SpO2 86 %. Physical Exam  Constitutional: She appears well-developed and well-nourished.  HENT:  Mouth/Throat: Oropharynx is clear and moist.  Eyes: Conjunctivae are normal. No scleral icterus.  Neck: No thyromegaly present.  Cardiovascular: Normal rate, regular rhythm and normal heart sounds.   No murmur heard. Respiratory: Effort normal and breath sounds normal.  GI: She exhibits no distension and no mass. There is no tenderness.  Musculoskeletal: She exhibits no edema.  Lymphadenopathy:    She has no cervical adenopathy.  Neurological: She is alert.  Skin: Skin is warm and dry.     Assessment/Plan History of melena. Heme positive stool and mild anemia. History of CRC. Diagnostic EGD and colonoscopy.  Hildred Laser, MD 07/26/2015, 12:23 PM

## 2015-07-26 NOTE — Op Note (Signed)
[facility] Patient Name: [name] Procedure Date: 07/26/2015 12:12 PM MRN: [id number] Date of Birth: [birth year] Attending MD: Hildred Laser , MD CSN: [id number] Age: 82 Admit Type: Outpatient Procedure:                Upper GI endoscopy Indications:              Melena Providers:                Hildred Laser, MD, Lurline Del, RN, Randa Spike,                            Technician Referring MD:             Halford Chessman, MD Medicines:                Cetacaine spray, Meperidine 25 mg IV, Midazolam 3                            mg IV Complications:            No immediate complications. Estimated Blood Loss:     Estimated blood loss: none. Procedure:                Pre-Anesthesia Assessment:                           - Prior to the procedure, a History and Physical                            was performed, and patient medications and                            allergies were reviewed. The patient's tolerance of                            previous anesthesia was also reviewed. The risks                            and benefits of the procedure and the sedation                            options and risks were discussed with the patient.                            All questions were answered, and informed consent                            was obtained. Prior Anticoagulants: The patient                            last took Eliquis (apixaban) 2 days prior to the                            procedure. ASA Grade Assessment: III - A patient  with severe systemic disease. After reviewing the                            risks and benefits, the patient was deemed in                            satisfactory condition to undergo the procedure.                           After obtaining informed consent, the endoscope was                            passed under direct vision. Throughout the                            procedure, the patient's blood pressure,  pulse, and                            oxygen saturations were monitored continuously. The                            [id number] scope was introduced through the                            mouth, and advanced to the second part of duodenum.                            The upper GI endoscopy was accomplished without                            difficulty. The patient tolerated the procedure                            well. Scope In: 12:33:42 PM Scope Out: 12:37:00 PM Total Procedure Duration: 0 hours 3 minutes 18 seconds  Findings:      The examined esophagus was normal.      The Z-line was regular and was found 40 cm from the incisors.      The entire examined stomach was normal.      The duodenal bulb and second portion of the duodenum were normal. Impression:               - Normal esophagus.                           - Z-line regular, 40 cm from the incisors.                           - Normal stomach.                           - Normal duodenal bulb and second portion of the                            duodenum.                           -  No specimens collected. Moderate Sedation:      Moderate (conscious) sedation was administered by the endoscopy nurse       and supervised by the endoscopist. The following parameters were       monitored: oxygen saturation, heart rate, blood pressure, CO2       capnography and response to care. Total physician intraservice time was       8 minutes. Recommendation:           - Perform a colonoscopy today.                           - Change omeprazole to 20 mg by mouth every other                            day.                           - See the other procedure note for documentation of                            additional recommendations.                           - Resume previous diet.                           - Continue present medications.                           - Resume Eliquis (apixaban) at prior dose today. Procedure  Code(s):        --- Professional ---                           [id number], Esophagogastroduodenoscopy, flexible,                            transoral; diagnostic, including collection of                            specimen(s) by brushing or washing, when performed                            (separate procedure) Diagnosis Code(s):        --- Professional ---                           K92.1, Melena (includes Hematochezia) CPT copyright 2016 [employer]. All rights reserved. The codes documented in this report are preliminary and upon coder review may  be revised to meet current compliance requirements. Hildred Laser, MD Hildred Laser, MD 07/26/2015 1:14:55 PM This report has been signed electronically. Number of Addenda: 0

## 2015-07-26 NOTE — Discharge Instructions (Signed)
Change omeprazole 20 mg to every other day. Resume other medications and diet as before. No driving for 24 hours. Call if you have tarry stools or rectal bleeding. Will check H&H in 3 months.  Colonoscopy, Care After These instructions give you information on caring for yourself after your procedure. Your doctor may also give you more specific instructions. Call your doctor if you have any problems or questions after your procedure. HOME CARE  Do not drive for 24 hours.  Do not sign important papers or use machinery for 24 hours.  You may shower.  You may go back to your usual activities, but go slower for the first 24 hours.  Take rest breaks often during the first 24 hours.  Walk around or use warm packs on your belly (abdomen) if you have belly cramping or gas.  Drink enough fluids to keep your pee (urine) clear or pale yellow.  Resume your normal diet. Avoid heavy or fried foods.  Avoid drinking alcohol for 24 hours or as told by your doctor.  Only take medicines as told by your doctor. If a tissue sample (biopsy) was taken during the procedure:   Do not take aspirin or blood thinners for 7 days, or as told by your doctor.  Do not drink alcohol for 7 days, or as told by your doctor.  Eat soft foods for the first 24 hours. GET HELP IF: You still have a small amount of blood in your poop (stool) 2-3 days after the procedure. GET HELP RIGHT AWAY IF:  You have more than a small amount of blood in your poop.  You see clumps of tissue (blood clots) in your poop.  Your belly is puffy (swollen).  You feel sick to your stomach (nauseous) or throw up (vomit).  You have a fever.  You have belly pain that gets worse and medicine does not help. MAKE SURE YOU:  Understand these instructions.  Will watch your condition.  Will get help right away if you are not doing well or get worse.   This information is not intended to replace advice given to you by your health care  provider. Make sure you discuss any questions you have with your health care provider.   Document Released: 01/31/2010 Document Revised: 01/03/2013 Document Reviewed: 09/05/2012 Elsevier Interactive Patient Education 2016 [place].   Esophagogastroduodenoscopy, Care After Refer to this sheet in the next few weeks. These instructions provide you with information about caring for yourself after your procedure. Your health care provider may also give you more specific instructions. Your treatment has been planned according to current medical practices, but problems sometimes occur. Call your health care provider if you have any problems or questions after your procedure. WHAT TO EXPECT AFTER THE PROCEDURE After your procedure, it is typical to feel:  Soreness in your throat.  Pain with swallowing.  Sick to your stomach (nauseous).  Bloated.  Dizzy.  Fatigued. HOME CARE INSTRUCTIONS  Do not eat or drink anything until the numbing medicine (local anesthetic) has worn off and your gag reflex has returned. You will know that the local anesthetic has worn off when you can swallow comfortably.  Do not drive or operate machinery until directed by your health care provider.  Take medicines only as directed by your health care provider. SEEK MEDICAL CARE IF:   You cannot stop coughing.  You are not urinating at all or less than usual. SEEK IMMEDIATE MEDICAL CARE IF:  You have difficulty swallowing.  You cannot eat or drink.  You have worsening throat or chest pain.  You have dizziness or lightheadedness or you faint.  You have nausea or vomiting.  You have chills.  You have a fever.  You have severe abdominal pain.  You have black, tarry, or bloody stools.   This information is not intended to replace advice given to you by your health care provider. Make sure you discuss any questions you have with your health care provider.   Document Released: 12/16/2011 Document  Revised: 01/19/2014 Document Reviewed: 12/16/2011 Elsevier Interactive Patient Education [employer].

## 2015-07-26 NOTE — Op Note (Signed)
[facility] Patient Name: [name] Procedure Date: 07/26/2015 12:37 PM MRN: [id number] Date of Birth: [birth year] Attending MD: Hildred Laser , MD CSN: [id number] Age: 82 Admit Type: Outpatient Procedure:                Colonoscopy Indications:              Heme positive stool. history of colon carcinoma. Providers:                Hildred Laser, MD, Lurline Del, RN, Randa Spike,                            Technician Referring MD:             Dr. Halford Chessman, MD Medicines:                Meperidine 25 mg IV, Midazolam 2 mg IV Complications:            No immediate complications. Estimated Blood Loss:     Estimated blood loss: none. Procedure:                Pre-Anesthesia Assessment:                           - Prior to the procedure, a History and Physical                            was performed, and patient medications and                            allergies were reviewed. The patient's tolerance of                            previous anesthesia was also reviewed. The risks                            and benefits of the procedure and the sedation                            options and risks were discussed with the patient.                            All questions were answered, and informed consent                            was obtained. Prior Anticoagulants: The patient                            last took Eliquis (apixaban) 2 days prior to the                            procedure. ASA Grade Assessment: III - A patient                            with severe systemic disease. After reviewing the  risks and benefits, the patient was deemed in                            satisfactory condition to undergo the procedure.                           After obtaining informed consent, the colonoscope                            was passed under direct vision. Throughout the                            procedure, the patient's blood pressure, pulse,  and                            oxygen saturations were monitored continuously. The                            [id number]) scope was introduced through                            the anus and advanced to the the terminal ileum.                            The colonoscopy was performed without difficulty.                            The patient tolerated the procedure well. The                            quality of the bowel preparation was adequate. The                            terminal ileum and the rectum were photographed. Scope In: 12:41:59 PM Scope Out: 1:01:48 PM Scope Withdrawal Time: 0 hours 8 minutes 28 seconds  Total Procedure Duration: 0 hours 19 minutes 49 seconds  Findings:      The neo-terminal ileum appeared normal.      There was evidence of a prior end-to-end ileo-colonic anastomosis at the       hepatic flexure. This was patent and was characterized by healthy       appearing mucosa. The anastomosis was traversed.      Two small-mouthed diverticula were found in the sigmoid colon.      The exam was otherwise without abnormality.      Internal hemorrhoids were found during retroflexion. The hemorrhoids       were small. Impression:               - The examined portion of the ileum was normal.                           - Patent end-to-end ileo-colonic anastomosis,                            characterized by healthy appearing mucosa.                           -  Diverticulosis in the sigmoid colon.                           - The examination was otherwise normal.                           - Internal hemorrhoids.                           - No specimens collected. Moderate Sedation:      Moderate (conscious) sedation was administered by the endoscopy nurse       and supervised by the endoscopist. The following parameters were       monitored: oxygen saturation, heart rate, blood pressure, CO2       capnography and response to care. Total physician intraservice  time was       24 minutes. Recommendation:           - Patient has a contact number available for                            emergencies. The signs and symptoms of potential                            delayed complications were discussed with the                            patient. Return to normal activities tomorrow.                            Written discharge instructions were provided to the                            patient.                           - Resume previous diet today.                           - Continue present medications.                           - Resume Eliquis (apixaban) at prior dose today.                           - No repeat colonoscopy due to age. Procedure Code(s):        --- Professional ---                           [id number], Colonoscopy, flexible; diagnostic, including                            collection of specimen(s) by brushing or washing,                            when performed (separate procedure)  [id number], Moderate sedation services provided by the                            same physician or other qualified health care                            professional performing the diagnostic or                            therapeutic service that the sedation supports,                            requiring the presence of an independent trained                            observer to assist in the monitoring of the                            patient's level of consciousness and physiological                            status; initial 15 minutes of intraservice time,                            patient age 54 years or older                           [phone], Moderate sedation services; each additional                            15 minutes intraservice time Diagnosis Code(s):        --- Professional ---                           K64.8, Other hemorrhoids                           Z98.0, Intestinal bypass and anastomosis status                            R19.5, Other fecal abnormalities                           K57.30, Diverticulosis of large intestine without                            perforation or abscess without bleeding CPT copyright 2016 American Medical Association. All rights reserved. The codes documented in this report are preliminary and upon coder review may  be revised to meet current compliance requirements. Hildred Laser, MD Hildred Laser, MD 07/26/2015 1:21:01 PM This report has been signed electronically. Number of Addenda: 0

## 2015-07-30 ENCOUNTER — Encounter (HOSPITAL_COMMUNITY): Payer: Self-pay | Admitting: Internal Medicine

## 2015-09-06 DIAGNOSIS — Z79899 Other long term (current) drug therapy: Secondary | ICD-10-CM | POA: Diagnosis not present

## 2015-10-01 ENCOUNTER — Other Ambulatory Visit: Payer: Self-pay | Admitting: Cardiovascular Disease

## 2015-10-14 ENCOUNTER — Encounter: Payer: Self-pay | Admitting: Cardiovascular Disease

## 2015-10-14 ENCOUNTER — Ambulatory Visit (INDEPENDENT_AMBULATORY_CARE_PROVIDER_SITE_OTHER): Payer: Medicare Other | Admitting: Cardiovascular Disease

## 2015-10-14 VITALS — BP 104/58 | HR 83 | Ht 63.0 in | Wt 130.0 lb

## 2015-10-14 DIAGNOSIS — I10 Essential (primary) hypertension: Secondary | ICD-10-CM | POA: Diagnosis not present

## 2015-10-14 DIAGNOSIS — I480 Paroxysmal atrial fibrillation: Secondary | ICD-10-CM

## 2015-10-14 NOTE — Progress Notes (Signed)
SUBJECTIVE: The patient is here to follow up for paroxysmal atrial fibrillation and hypertension. She is on apixaban for anticoagulation. She is feeling very well. She seldom has palpitations. Denies chest pain and shortness of breath.  ECG performed in the office today which I personally interpreted demonstrated normal sinus rhythm with diffuse nonspecific T wave abnormalities.   Review of Systems: As per "subjective", otherwise negative.  Allergies  Allergen Reactions  . Iodinated Diagnostic Agents Anaphylaxis    08-12-10 Pt had severe reaction with itching/hives/loss of consciousness within 5 minutes of CEPI today.  She received both Kenalog and Iondinated Contrast, so not certain which agent caused reaction.  If patient returns for another CEPI, Dr. Jobe Igo considers using Decadron and a 13-hour prep.  jkl  . Nerve Block Tray Anaphylaxis    Patient states that she was sent to have Epidural for her shoulder. After rec'ving she remembers saying that she was itching all over  And that's it. She was transported to hospital  By EMS. Stayed overnight.  Bufford Spikes Derivatives Hives and Nausea And Vomiting  . Triamcinolone Acetonide Anaphylaxis    08-12-10  Pt had severe reaction with itching/hives/loss of consciousness within five minutes of CEPI today.  She received both Kenalog and Iodinated Contrast, so not certain which agent caused reaction.  If pt returns for another cervical epidural steroid injection, Dr. Jobe Igo suggests using Decadron and a 13-hr prep.  JKL PATIENT USES PROCTOCREAM 2.5% WITHOUT DIFFICULTY FOR VULVAR DYSTROPHY    Current Outpatient Prescriptions  Medication Sig Dispense Refill  . amLODipine (NORVASC) 5 MG tablet take 1 tablet by mouth once daily 90 tablet 3  . apixaban (ELIQUIS) 5 MG TABS tablet Take 1 tablet (5 mg total) by mouth 2 (two) times daily. 60 tablet 8  . ergocalciferol (VITAMIN D2) 50000 UNITS capsule Take 50,000 Units by mouth once a week. Takes  on Thursday    . leflunomide (ARAVA) 20 MG tablet Take 20 mg by mouth daily.    Marland Kitchen lisinopril (PRINIVIL,ZESTRIL) 20 MG tablet Take 1 tablet (20 mg total) by mouth 2 (two) times daily. 90 tablet 3  . metoprolol succinate (TOPROL-XL) 50 MG 24 hr tablet Take 1 tablet (50 mg total) by mouth 2 (two) times daily. 60 tablet 6  . Omega-3 Fatty Acids (FISH OIL) 1000 MG CAPS Take by mouth.    Marland Kitchen omeprazole (PRILOSEC) 20 MG capsule Take 1 capsule (20 mg total) by mouth every other day. 60 capsule 5   No current facility-administered medications for this visit.     Past Medical History:  Diagnosis Date  . Cervicalgia   . Colon cancer ([place])   . Degeneration of cervical intervertebral disc   . Hypertension   . Pinched nerve in shoulder   . Rheumatoid arthritis(714.0)   . RUQ pain   . Vulvar dystrophy   . Yeast infection     Past Surgical History:  Procedure Laterality Date  . ABDOMINAL HYSTERECTOMY  1960'S  . APPENDECTOMY  with hysterectomy  . BACK SURGERY  12/08/10   plate/screws C3 C4  . CHOLECYSTECTOMY  2002  . COLONOSCOPY  04/02/2009  . COLONOSCOPY  06/04/05  . COLONOSCOPY N/A 07/26/2015   Procedure: COLONOSCOPY;  Surgeon: Rogene Houston, MD;  [facility];  Service: Endoscopy;  Laterality: N/A;  1240  . COLONOSCOPY WITH ESOPHAGOGASTRODUODENOSCOPY (EGD) N/A 04/08/2012   Procedure: COLONOSCOPY WITH ESOPHAGOGASTRODUODENOSCOPY (EGD);  Surgeon: Rogene Houston, MD;  [facility];  Service:  Endoscopy;  Laterality: N/A;  130-moved to 245 Ann notified pt  . ESOPHAGOGASTRODUODENOSCOPY N/A 07/26/2015   Procedure: ESOPHAGOGASTRODUODENOSCOPY (EGD);  Surgeon: Rogene Houston, MD;  [facility];  Service: Endoscopy;  Laterality: N/A;  . HEMICOLECTOMY  10/05    Social History   Social History  . Marital status: Married    Spouse name: N/A  . Number of children: N/A  . Years of education: N/A   Occupational History  . Not on file.   Social History Main Topics    . Smoking status: Never Smoker  . Smokeless tobacco: Never Used  . Alcohol use No  . Drug use: No  . Sexual activity: No   Other Topics Concern  . Not on file   Social History Narrative  . No narrative on file     Vitals:   10/14/15 1008  BP: (!) 104/58  Pulse: 83  SpO2: 95%  Weight: 130 lb (59 kg)  Height: 5\' 3"  (1.6 m)    PHYSICAL EXAM General: NAD HEENT: Normal. Neck: No JVD, no thyromegaly. Lungs: Clear to auscultation bilaterally with normal respiratory effort. CV: Nondisplaced PMI.  Regular rate and rhythm, normal S1/S2, no S3/S4, no murmur. No pretibial or periankle edema.  No carotid bruit.   Abdomen: Soft, nontender, no distention.  Neurologic: Alert and oriented.  Psych: Normal affect. Skin: Normal. Musculoskeletal: No gross deformities.    ECG: Most recent ECG reviewed.      ASSESSMENT AND PLAN: 1. Paroxysmal atrial fibrillation: Symptoms fairly well controlled on Toprol-XL 50 mg twice daily. Continue Eliquis for anticoagulation.   2. Essential HTN: Well controlled on present therapy which includes lisinopril 20 mg daily. No changes.  Dispo: f/u 1 year.   Kate Sable, M.D., F.A.C.C.

## 2015-10-14 NOTE — Patient Instructions (Signed)
Your physician wants you to follow-up in: 1 year You will receive a reminder letter in the mail two months in advance. If you don't receive a letter, please call our office to schedule the follow-up appointment.    Your physician recommends that you continue on your current medications as directed. Please refer to the Current Medication list given to you today.     Thank you for choosing [facility] !  

## 2015-10-17 ENCOUNTER — Telehealth: Payer: Self-pay | Admitting: *Deleted

## 2015-10-17 NOTE — Telephone Encounter (Signed)
Patient notified that Eliquis is at office for pick up

## 2015-11-07 DIAGNOSIS — J340 Abscess, furuncle and carbuncle of nose: Secondary | ICD-10-CM | POA: Diagnosis not present

## 2015-11-07 DIAGNOSIS — I10 Essential (primary) hypertension: Secondary | ICD-10-CM | POA: Diagnosis not present

## 2015-11-07 DIAGNOSIS — Z6823 Body mass index (BMI) 23.0-23.9, adult: Secondary | ICD-10-CM | POA: Diagnosis not present

## 2015-11-07 DIAGNOSIS — Z1389 Encounter for screening for other disorder: Secondary | ICD-10-CM | POA: Diagnosis not present

## 2015-12-09 DIAGNOSIS — Z79899 Other long term (current) drug therapy: Secondary | ICD-10-CM | POA: Diagnosis not present

## 2015-12-09 DIAGNOSIS — M5136 Other intervertebral disc degeneration, lumbar region: Secondary | ICD-10-CM | POA: Diagnosis not present

## 2015-12-09 DIAGNOSIS — M0589 Other rheumatoid arthritis with rheumatoid factor of multiple sites: Secondary | ICD-10-CM | POA: Diagnosis not present

## 2015-12-09 DIAGNOSIS — M150 Primary generalized (osteo)arthritis: Secondary | ICD-10-CM | POA: Diagnosis not present

## 2015-12-09 DIAGNOSIS — M810 Age-related osteoporosis without current pathological fracture: Secondary | ICD-10-CM | POA: Diagnosis not present

## 2015-12-09 DIAGNOSIS — E559 Vitamin D deficiency, unspecified: Secondary | ICD-10-CM | POA: Diagnosis not present

## 2015-12-15 ENCOUNTER — Other Ambulatory Visit (INDEPENDENT_AMBULATORY_CARE_PROVIDER_SITE_OTHER): Payer: Self-pay | Admitting: Internal Medicine

## 2015-12-25 ENCOUNTER — Other Ambulatory Visit (HOSPITAL_COMMUNITY): Payer: Self-pay | Admitting: Internal Medicine

## 2015-12-25 DIAGNOSIS — Z1231 Encounter for screening mammogram for malignant neoplasm of breast: Secondary | ICD-10-CM

## 2015-12-31 DIAGNOSIS — H1820 Unspecified corneal edema: Secondary | ICD-10-CM | POA: Diagnosis not present

## 2015-12-31 DIAGNOSIS — H04123 Dry eye syndrome of bilateral lacrimal glands: Secondary | ICD-10-CM | POA: Diagnosis not present

## 2015-12-31 DIAGNOSIS — Z961 Presence of intraocular lens: Secondary | ICD-10-CM | POA: Diagnosis not present

## 2016-01-03 ENCOUNTER — Telehealth: Payer: Self-pay | Admitting: Cardiovascular Disease

## 2016-01-21 ENCOUNTER — Other Ambulatory Visit (HOSPITAL_COMMUNITY): Payer: Self-pay | Admitting: Registered Nurse

## 2016-01-21 DIAGNOSIS — R200 Anesthesia of skin: Secondary | ICD-10-CM | POA: Diagnosis not present

## 2016-01-21 DIAGNOSIS — Z1389 Encounter for screening for other disorder: Secondary | ICD-10-CM | POA: Diagnosis not present

## 2016-01-21 DIAGNOSIS — Z6823 Body mass index (BMI) 23.0-23.9, adult: Secondary | ICD-10-CM | POA: Diagnosis not present

## 2016-01-21 DIAGNOSIS — R0989 Other specified symptoms and signs involving the circulatory and respiratory systems: Secondary | ICD-10-CM

## 2016-01-21 DIAGNOSIS — R202 Paresthesia of skin: Secondary | ICD-10-CM | POA: Diagnosis not present

## 2016-01-21 DIAGNOSIS — I10 Essential (primary) hypertension: Secondary | ICD-10-CM | POA: Diagnosis not present

## 2016-01-22 DIAGNOSIS — Z961 Presence of intraocular lens: Secondary | ICD-10-CM | POA: Diagnosis not present

## 2016-01-22 DIAGNOSIS — H04123 Dry eye syndrome of bilateral lacrimal glands: Secondary | ICD-10-CM | POA: Diagnosis not present

## 2016-01-29 ENCOUNTER — Ambulatory Visit (HOSPITAL_COMMUNITY): Payer: Medicare Other

## 2016-02-12 DIAGNOSIS — Z961 Presence of intraocular lens: Secondary | ICD-10-CM | POA: Diagnosis not present

## 2016-02-12 DIAGNOSIS — H04123 Dry eye syndrome of bilateral lacrimal glands: Secondary | ICD-10-CM | POA: Diagnosis not present

## 2016-02-13 ENCOUNTER — Ambulatory Visit (HOSPITAL_COMMUNITY)
Admission: RE | Admit: 2016-02-13 | Discharge: 2016-02-13 | Disposition: A | Discharge status: Home / Self Care | Payer: Medicare Other | Source: Ambulatory Visit | Attending: Internal Medicine | Admitting: Internal Medicine

## 2016-02-13 DIAGNOSIS — Z1231 Encounter for screening mammogram for malignant neoplasm of breast: Secondary | ICD-10-CM

## 2016-03-16 DIAGNOSIS — Z79899 Other long term (current) drug therapy: Secondary | ICD-10-CM | POA: Diagnosis not present

## 2016-03-25 ENCOUNTER — Other Ambulatory Visit: Payer: Self-pay | Admitting: Cardiovascular Disease

## 2016-03-30 ENCOUNTER — Ambulatory Visit (INDEPENDENT_AMBULATORY_CARE_PROVIDER_SITE_OTHER): Payer: Medicare Other | Admitting: Cardiovascular Disease

## 2016-03-30 ENCOUNTER — Encounter: Payer: Self-pay | Admitting: Cardiovascular Disease

## 2016-03-30 VITALS — BP 136/72 | HR 103 | Ht 63.0 in | Wt 128.0 lb

## 2016-03-30 DIAGNOSIS — I480 Paroxysmal atrial fibrillation: Secondary | ICD-10-CM | POA: Diagnosis not present

## 2016-03-30 DIAGNOSIS — I10 Essential (primary) hypertension: Secondary | ICD-10-CM

## 2016-03-30 DIAGNOSIS — R0602 Shortness of breath: Secondary | ICD-10-CM

## 2016-03-30 NOTE — Patient Instructions (Signed)
Your physician wants you to follow-up in: 1 year with Dr Virgina Jock will receive a reminder letter in the mail two months in advance. If you don't receive a letter, please call our office to schedule the follow-up appointment.    Your physician recommends that you continue on your current medications as directed. Please refer to the Current Medication list given to you today.     You have been referred to ENT, Dr Benjamine Mola  [phone]       Thank you for choosing [facility] !

## 2016-03-30 NOTE — Progress Notes (Signed)
SUBJECTIVE: The patient presents for premature follow-up for shortness of breath. She has a history of paroxysmal atrial fibrillation and hypertension and takes apixaban for anticoagulation. When I saw her on 10/14/15 she was doing well.  She said she has become more short of breath with talking. When she is talking to her brother she quickly becomes short of breath. She does not carry on long conversations. She takes care of her husband who has dementia. She denies exertional dyspnea and chest pain. She said after she has been talking for some time she notices a slight change in the quality of her voice. She has a lot of phlegm but says this is chronic.  She is present with her daughter-in-law.  Chest CT/11/16 showed chronic scarring with bronchiectasis of the right lower lobe which was stable.   Review of Systems: As per "subjective", otherwise negative.  Allergies  Allergen Reactions  . Iodinated Diagnostic Agents Anaphylaxis    08-12-10 Pt had severe reaction with itching/hives/loss of consciousness within 5 minutes of CEPI today.  She received both Kenalog and Iondinated Contrast, so not certain which agent caused reaction.  If patient returns for another CEPI, Dr. Jobe Igo considers using Decadron and a 13-hour prep.  jkl  . Nerve Block Tray Anaphylaxis    Patient states that she was sent to have Epidural for her shoulder. After rec'ving she remembers saying that she was itching all over  And that's it. She was transported to hospital  By EMS. Stayed overnight.  Bufford Spikes Derivatives Hives and Nausea And Vomiting  . Triamcinolone Acetonide Anaphylaxis    08-12-10  Pt had severe reaction with itching/hives/loss of consciousness within five minutes of CEPI today.  She received both Kenalog and Iodinated Contrast, so not certain which agent caused reaction.  If pt returns for another cervical epidural steroid injection, Dr. Jobe Igo suggests using Decadron and a 13-hr prep.   JKL PATIENT USES PROCTOCREAM 2.5% WITHOUT DIFFICULTY FOR VULVAR DYSTROPHY    Current Outpatient Prescriptions  Medication Sig Dispense Refill  . amLODipine (NORVASC) 5 MG tablet take 1 tablet by mouth once daily 90 tablet 3  . apixaban (ELIQUIS) 5 MG TABS tablet Take 1 tablet (5 mg total) by mouth 2 (two) times daily. 60 tablet 8  . ergocalciferol (VITAMIN D2) 50000 UNITS capsule Take 50,000 Units by mouth once a week. Takes on Thursday    . leflunomide (ARAVA) 20 MG tablet Take 20 mg by mouth daily.    Marland Kitchen lisinopril (PRINIVIL,ZESTRIL) 20 MG tablet Take 1 tablet (20 mg total) by mouth 2 (two) times daily. 90 tablet 3  . metoprolol succinate (TOPROL-XL) 50 MG 24 hr tablet take 1 tablet by mouth twice a day 60 tablet 11  . Omega-3 Fatty Acids (FISH OIL) 1000 MG CAPS Take by mouth.    Marland Kitchen omeprazole (PRILOSEC) 20 MG capsule Take 1 capsule (20 mg total) by mouth every other day. 60 capsule 5  . omeprazole (PRILOSEC) 20 MG capsule take 1 capsule by mouth twice a day 60 capsule 5   No current facility-administered medications for this visit.     Past Medical History:  Diagnosis Date  . Cervicalgia   . Colon cancer ([place])   . Degeneration of cervical intervertebral disc   . Hypertension   . Pinched nerve in shoulder   . Rheumatoid arthritis(714.0)   . RUQ pain   . Vulvar dystrophy   . Yeast infection     Past Surgical History:  Procedure  Laterality Date  . ABDOMINAL HYSTERECTOMY  1960'S  . APPENDECTOMY  with hysterectomy  . BACK SURGERY  12/08/10   plate/screws C3 C4  . CHOLECYSTECTOMY  2002  . COLONOSCOPY  04/02/2009  . COLONOSCOPY  06/04/05  . COLONOSCOPY N/A 07/26/2015   Procedure: COLONOSCOPY;  Surgeon: Rogene Houston, MD;  [facility];  Service: Endoscopy;  Laterality: N/A;  1240  . COLONOSCOPY WITH ESOPHAGOGASTRODUODENOSCOPY (EGD) N/A 04/08/2012   Procedure: COLONOSCOPY WITH ESOPHAGOGASTRODUODENOSCOPY (EGD);  Surgeon: Rogene Houston, MD;  [facility];   Service: Endoscopy;  Laterality: N/A;  130-moved to 245 Ann notified pt  . ESOPHAGOGASTRODUODENOSCOPY N/A 07/26/2015   Procedure: ESOPHAGOGASTRODUODENOSCOPY (EGD);  Surgeon: Rogene Houston, MD;  [facility];  Service: Endoscopy;  Laterality: N/A;  . HEMICOLECTOMY  10/05    Social History   Social History  . Marital status: Married    Spouse name: N/A  . Number of children: N/A  . Years of education: N/A   Occupational History  . Not on file.   Social History Main Topics  . Smoking status: Never Smoker  . Smokeless tobacco: Never Used  . Alcohol use No  . Drug use: No  . Sexual activity: No   Other Topics Concern  . Not on file   Social History Narrative  . No narrative on file     Vitals:   03/30/16 1041  BP: 136/72  Pulse: (!) 103  SpO2: 94%  Weight: 128 lb (58.1 kg)  Height: 5\' 3"  (1.6 m)    PHYSICAL EXAM General: NAD HEENT: Normal. Neck: No JVD, no thyromegaly. Lungs: Faint end-expiratory wheezes in upper lobes, faint crackles at right base. CV: Nondisplaced PMI.  Regular rate and rhythm, normal S1/S2, no S3/S4, no murmur. No pretibial or periankle edema.   Abdomen: Soft, nontender, no distention.  Neurologic: Alert and oriented.  Psych: Normal affect. Skin: Normal. Musculoskeletal: No gross deformities.    ECG: Most recent ECG reviewed.      ASSESSMENT AND PLAN: 1. Paroxysmal atrial fibrillation: Symptoms fairly well controlled on Toprol-XL 50 mg twice daily. Continue Eliquis for anticoagulation.   2. Essential HTN: Well controlled on present therapy which includes lisinopril 20 mg daily. No changes.  3. Shortness of breath with talking: She denies exertional dyspnea. I do not feel her symptoms are cardiac in etiology. She may have vocal cord dysfunction. I will refer her to an ENT specialist.  Dispo: f/u 1 year.   Kate Sable, M.D., F.A.C.C.

## 2016-04-22 DIAGNOSIS — I10 Essential (primary) hypertension: Secondary | ICD-10-CM | POA: Diagnosis not present

## 2016-04-22 DIAGNOSIS — M5431 Sciatica, right side: Secondary | ICD-10-CM | POA: Diagnosis not present

## 2016-04-22 DIAGNOSIS — R7309 Other abnormal glucose: Secondary | ICD-10-CM | POA: Diagnosis not present

## 2016-04-22 DIAGNOSIS — Z6824 Body mass index (BMI) 24.0-24.9, adult: Secondary | ICD-10-CM | POA: Diagnosis not present

## 2016-04-22 DIAGNOSIS — Z0001 Encounter for general adult medical examination with abnormal findings: Secondary | ICD-10-CM | POA: Diagnosis not present

## 2016-04-22 DIAGNOSIS — E782 Mixed hyperlipidemia: Secondary | ICD-10-CM | POA: Diagnosis not present

## 2016-04-22 DIAGNOSIS — M069 Rheumatoid arthritis, unspecified: Secondary | ICD-10-CM | POA: Diagnosis not present

## 2016-04-22 DIAGNOSIS — Z1389 Encounter for screening for other disorder: Secondary | ICD-10-CM | POA: Diagnosis not present

## 2016-05-04 ENCOUNTER — Ambulatory Visit (INDEPENDENT_AMBULATORY_CARE_PROVIDER_SITE_OTHER): Payer: Medicare Other | Admitting: Otolaryngology

## 2016-05-04 DIAGNOSIS — R490 Dysphonia: Secondary | ICD-10-CM | POA: Diagnosis not present

## 2016-05-11 ENCOUNTER — Other Ambulatory Visit: Payer: Self-pay | Admitting: Cardiovascular Disease

## 2016-05-14 ENCOUNTER — Other Ambulatory Visit (HOSPITAL_COMMUNITY): Payer: Self-pay | Admitting: Registered Nurse

## 2016-05-14 DIAGNOSIS — R0989 Other specified symptoms and signs involving the circulatory and respiratory systems: Secondary | ICD-10-CM

## 2016-05-14 DIAGNOSIS — R200 Anesthesia of skin: Secondary | ICD-10-CM

## 2016-05-20 DIAGNOSIS — M5410 Radiculopathy, site unspecified: Secondary | ICD-10-CM | POA: Diagnosis not present

## 2016-05-20 DIAGNOSIS — Z6824 Body mass index (BMI) 24.0-24.9, adult: Secondary | ICD-10-CM | POA: Diagnosis not present

## 2016-05-20 DIAGNOSIS — M5136 Other intervertebral disc degeneration, lumbar region: Secondary | ICD-10-CM | POA: Diagnosis not present

## 2016-05-22 ENCOUNTER — Telehealth: Payer: Self-pay | Admitting: Cardiovascular Disease

## 2016-05-22 NOTE — Telephone Encounter (Signed)
Pt called wanting to know about her Eliquis assistance, she's almost out.

## 2016-05-22 NOTE — Telephone Encounter (Signed)
Patient notified that she needs to sign assistance forms. 1 week of Eliquis placed at front desk for pick up

## 2016-05-25 DIAGNOSIS — M5136 Other intervertebral disc degeneration, lumbar region: Secondary | ICD-10-CM | POA: Diagnosis not present

## 2016-05-25 DIAGNOSIS — M9983 Other biomechanical lesions of lumbar region: Secondary | ICD-10-CM | POA: Diagnosis not present

## 2016-05-25 DIAGNOSIS — M8448XA Pathological fracture, other site, initial encounter for fracture: Secondary | ICD-10-CM | POA: Diagnosis not present

## 2016-05-25 DIAGNOSIS — M47817 Spondylosis without myelopathy or radiculopathy, lumbosacral region: Secondary | ICD-10-CM | POA: Diagnosis not present

## 2016-05-25 DIAGNOSIS — M5410 Radiculopathy, site unspecified: Secondary | ICD-10-CM | POA: Diagnosis not present

## 2016-05-25 DIAGNOSIS — M48061 Spinal stenosis, lumbar region without neurogenic claudication: Secondary | ICD-10-CM | POA: Diagnosis not present

## 2016-05-25 DIAGNOSIS — M545 Low back pain: Secondary | ICD-10-CM | POA: Diagnosis not present

## 2016-05-25 DIAGNOSIS — Z6824 Body mass index (BMI) 24.0-24.9, adult: Secondary | ICD-10-CM | POA: Diagnosis not present

## 2016-06-03 ENCOUNTER — Telehealth: Payer: Self-pay | Admitting: Cardiovascular Disease

## 2016-06-03 NOTE — Telephone Encounter (Signed)
Pt is calling to check on her assistance for Eliquis

## 2016-06-03 NOTE — Telephone Encounter (Signed)
Called patient. No answer. Left message to call back.  

## 2016-06-05 NOTE — Telephone Encounter (Signed)
2 sample boxes of Eliquis placed at front desk for patient pick up Lot # [id number] Oct 2020

## 2016-06-09 DIAGNOSIS — M150 Primary generalized (osteo)arthritis: Secondary | ICD-10-CM | POA: Diagnosis not present

## 2016-06-09 DIAGNOSIS — M5136 Other intervertebral disc degeneration, lumbar region: Secondary | ICD-10-CM | POA: Diagnosis not present

## 2016-06-09 DIAGNOSIS — M0589 Other rheumatoid arthritis with rheumatoid factor of multiple sites: Secondary | ICD-10-CM | POA: Diagnosis not present

## 2016-06-09 DIAGNOSIS — E559 Vitamin D deficiency, unspecified: Secondary | ICD-10-CM | POA: Diagnosis not present

## 2016-07-09 ENCOUNTER — Telehealth: Payer: Self-pay

## 2016-07-09 NOTE — Telephone Encounter (Signed)
Eliquis samples, 4 boxes , lot [id number] exp 10/2018   K [employer] LPN checking on assistance from [employer]

## 2016-08-06 ENCOUNTER — Telehealth: Payer: Self-pay | Admitting: Cardiovascular Disease

## 2016-08-06 NOTE — Telephone Encounter (Signed)
Please call the pt concerning her Eliquis assistance.

## 2016-08-07 DIAGNOSIS — S32020A Wedge compression fracture of second lumbar vertebra, initial encounter for closed fracture: Secondary | ICD-10-CM | POA: Diagnosis not present

## 2016-08-07 DIAGNOSIS — M25512 Pain in left shoulder: Secondary | ICD-10-CM | POA: Diagnosis not present

## 2016-08-07 DIAGNOSIS — M75102 Unspecified rotator cuff tear or rupture of left shoulder, not specified as traumatic: Secondary | ICD-10-CM | POA: Diagnosis not present

## 2016-08-17 ENCOUNTER — Telehealth: Payer: Self-pay | Admitting: Cardiovascular Disease

## 2016-08-17 MED ORDER — APIXABAN 5 MG PO TABS: 5.0000 mg | ORAL_TABLET | Freq: Two times a day (BID) | ORAL | 3 refills | Status: DC

## 2016-08-17 NOTE — Telephone Encounter (Signed)
Patient states that she received letter that she no longer qualifies for Eliquis assistance. Needs RX sent to [facility] / tg

## 2016-08-18 DIAGNOSIS — Z01818 Encounter for other preprocedural examination: Secondary | ICD-10-CM | POA: Diagnosis not present

## 2016-08-18 DIAGNOSIS — M75102 Unspecified rotator cuff tear or rupture of left shoulder, not specified as traumatic: Secondary | ICD-10-CM | POA: Diagnosis not present

## 2016-08-18 DIAGNOSIS — Z933 Colostomy status: Secondary | ICD-10-CM | POA: Diagnosis not present

## 2016-08-18 DIAGNOSIS — S32020A Wedge compression fracture of second lumbar vertebra, initial encounter for closed fracture: Secondary | ICD-10-CM | POA: Diagnosis not present

## 2016-08-18 DIAGNOSIS — Z85038 Personal history of other malignant neoplasm of large intestine: Secondary | ICD-10-CM | POA: Diagnosis not present

## 2016-08-18 DIAGNOSIS — R339 Retention of urine, unspecified: Secondary | ICD-10-CM | POA: Diagnosis not present

## 2016-08-18 DIAGNOSIS — I10 Essential (primary) hypertension: Secondary | ICD-10-CM | POA: Diagnosis not present

## 2016-08-18 DIAGNOSIS — Z79899 Other long term (current) drug therapy: Secondary | ICD-10-CM | POA: Diagnosis not present

## 2016-08-18 DIAGNOSIS — M810 Age-related osteoporosis without current pathological fracture: Secondary | ICD-10-CM | POA: Diagnosis not present

## 2016-08-18 DIAGNOSIS — R262 Difficulty in walking, not elsewhere classified: Secondary | ICD-10-CM | POA: Diagnosis not present

## 2016-08-18 DIAGNOSIS — Z91041 Radiographic dye allergy status: Secondary | ICD-10-CM | POA: Diagnosis not present

## 2016-08-18 DIAGNOSIS — M4800 Spinal stenosis, site unspecified: Secondary | ICD-10-CM | POA: Diagnosis not present

## 2016-08-18 DIAGNOSIS — M1990 Unspecified osteoarthritis, unspecified site: Secondary | ICD-10-CM | POA: Diagnosis not present

## 2016-08-18 DIAGNOSIS — K219 Gastro-esophageal reflux disease without esophagitis: Secondary | ICD-10-CM | POA: Diagnosis not present

## 2016-08-18 DIAGNOSIS — Z8249 Family history of ischemic heart disease and other diseases of the circulatory system: Secondary | ICD-10-CM | POA: Diagnosis not present

## 2016-08-18 DIAGNOSIS — Z8261 Family history of arthritis: Secondary | ICD-10-CM | POA: Diagnosis not present

## 2016-08-18 DIAGNOSIS — Z888 Allergy status to other drugs, medicaments and biological substances status: Secondary | ICD-10-CM | POA: Diagnosis not present

## 2016-08-18 DIAGNOSIS — Z7902 Long term (current) use of antithrombotics/antiplatelets: Secondary | ICD-10-CM | POA: Diagnosis not present

## 2016-08-18 DIAGNOSIS — I4891 Unspecified atrial fibrillation: Secondary | ICD-10-CM | POA: Diagnosis not present

## 2016-08-18 DIAGNOSIS — Z9049 Acquired absence of other specified parts of digestive tract: Secondary | ICD-10-CM | POA: Diagnosis not present

## 2016-08-20 DIAGNOSIS — S32020A Wedge compression fracture of second lumbar vertebra, initial encounter for closed fracture: Secondary | ICD-10-CM | POA: Diagnosis not present

## 2016-08-20 DIAGNOSIS — Z79899 Other long term (current) drug therapy: Secondary | ICD-10-CM | POA: Diagnosis not present

## 2016-08-20 DIAGNOSIS — I10 Essential (primary) hypertension: Secondary | ICD-10-CM | POA: Diagnosis not present

## 2016-08-20 DIAGNOSIS — Z9049 Acquired absence of other specified parts of digestive tract: Secondary | ICD-10-CM | POA: Diagnosis not present

## 2016-08-20 DIAGNOSIS — R262 Difficulty in walking, not elsewhere classified: Secondary | ICD-10-CM | POA: Diagnosis not present

## 2016-08-20 DIAGNOSIS — Z8261 Family history of arthritis: Secondary | ICD-10-CM | POA: Diagnosis not present

## 2016-08-20 DIAGNOSIS — M810 Age-related osteoporosis without current pathological fracture: Secondary | ICD-10-CM | POA: Diagnosis not present

## 2016-08-20 DIAGNOSIS — M1990 Unspecified osteoarthritis, unspecified site: Secondary | ICD-10-CM | POA: Diagnosis not present

## 2016-08-20 DIAGNOSIS — M4800 Spinal stenosis, site unspecified: Secondary | ICD-10-CM | POA: Diagnosis not present

## 2016-08-20 DIAGNOSIS — Z85038 Personal history of other malignant neoplasm of large intestine: Secondary | ICD-10-CM | POA: Diagnosis not present

## 2016-08-20 DIAGNOSIS — K219 Gastro-esophageal reflux disease without esophagitis: Secondary | ICD-10-CM | POA: Diagnosis not present

## 2016-08-20 DIAGNOSIS — Z933 Colostomy status: Secondary | ICD-10-CM | POA: Diagnosis not present

## 2016-08-20 DIAGNOSIS — Z888 Allergy status to other drugs, medicaments and biological substances status: Secondary | ICD-10-CM | POA: Diagnosis not present

## 2016-08-20 DIAGNOSIS — Z7902 Long term (current) use of antithrombotics/antiplatelets: Secondary | ICD-10-CM | POA: Diagnosis not present

## 2016-08-20 DIAGNOSIS — M75102 Unspecified rotator cuff tear or rupture of left shoulder, not specified as traumatic: Secondary | ICD-10-CM | POA: Diagnosis not present

## 2016-08-20 DIAGNOSIS — Z91041 Radiographic dye allergy status: Secondary | ICD-10-CM | POA: Diagnosis not present

## 2016-08-20 DIAGNOSIS — R339 Retention of urine, unspecified: Secondary | ICD-10-CM | POA: Diagnosis not present

## 2016-08-20 DIAGNOSIS — Z8249 Family history of ischemic heart disease and other diseases of the circulatory system: Secondary | ICD-10-CM | POA: Diagnosis not present

## 2016-08-20 DIAGNOSIS — I4891 Unspecified atrial fibrillation: Secondary | ICD-10-CM | POA: Diagnosis not present

## 2016-08-21 DIAGNOSIS — M4800 Spinal stenosis, site unspecified: Secondary | ICD-10-CM | POA: Diagnosis not present

## 2016-08-21 DIAGNOSIS — Z9049 Acquired absence of other specified parts of digestive tract: Secondary | ICD-10-CM | POA: Diagnosis not present

## 2016-08-21 DIAGNOSIS — S32020A Wedge compression fracture of second lumbar vertebra, initial encounter for closed fracture: Secondary | ICD-10-CM | POA: Diagnosis not present

## 2016-08-21 DIAGNOSIS — Z8249 Family history of ischemic heart disease and other diseases of the circulatory system: Secondary | ICD-10-CM | POA: Diagnosis not present

## 2016-08-21 DIAGNOSIS — M75102 Unspecified rotator cuff tear or rupture of left shoulder, not specified as traumatic: Secondary | ICD-10-CM | POA: Diagnosis not present

## 2016-08-21 DIAGNOSIS — M19012 Primary osteoarthritis, left shoulder: Secondary | ICD-10-CM | POA: Diagnosis not present

## 2016-08-21 DIAGNOSIS — I10 Essential (primary) hypertension: Secondary | ICD-10-CM | POA: Diagnosis not present

## 2016-08-21 DIAGNOSIS — Z79899 Other long term (current) drug therapy: Secondary | ICD-10-CM | POA: Diagnosis not present

## 2016-08-21 DIAGNOSIS — M1990 Unspecified osteoarthritis, unspecified site: Secondary | ICD-10-CM | POA: Diagnosis not present

## 2016-08-21 DIAGNOSIS — M810 Age-related osteoporosis without current pathological fracture: Secondary | ICD-10-CM | POA: Diagnosis not present

## 2016-08-21 DIAGNOSIS — Z91041 Radiographic dye allergy status: Secondary | ICD-10-CM | POA: Diagnosis not present

## 2016-08-21 DIAGNOSIS — Z85038 Personal history of other malignant neoplasm of large intestine: Secondary | ICD-10-CM | POA: Diagnosis not present

## 2016-08-21 DIAGNOSIS — Z888 Allergy status to other drugs, medicaments and biological substances status: Secondary | ICD-10-CM | POA: Diagnosis not present

## 2016-08-21 DIAGNOSIS — K219 Gastro-esophageal reflux disease without esophagitis: Secondary | ICD-10-CM | POA: Diagnosis not present

## 2016-08-21 DIAGNOSIS — Z933 Colostomy status: Secondary | ICD-10-CM | POA: Diagnosis not present

## 2016-08-21 DIAGNOSIS — Z8261 Family history of arthritis: Secondary | ICD-10-CM | POA: Diagnosis not present

## 2016-08-21 DIAGNOSIS — I4891 Unspecified atrial fibrillation: Secondary | ICD-10-CM | POA: Diagnosis not present

## 2016-08-21 DIAGNOSIS — R262 Difficulty in walking, not elsewhere classified: Secondary | ICD-10-CM | POA: Diagnosis not present

## 2016-08-21 DIAGNOSIS — R339 Retention of urine, unspecified: Secondary | ICD-10-CM | POA: Diagnosis not present

## 2016-08-21 DIAGNOSIS — Z7902 Long term (current) use of antithrombotics/antiplatelets: Secondary | ICD-10-CM | POA: Diagnosis not present

## 2016-08-22 DIAGNOSIS — Z91041 Radiographic dye allergy status: Secondary | ICD-10-CM | POA: Diagnosis not present

## 2016-08-22 DIAGNOSIS — I10 Essential (primary) hypertension: Secondary | ICD-10-CM | POA: Diagnosis not present

## 2016-08-22 DIAGNOSIS — Z79899 Other long term (current) drug therapy: Secondary | ICD-10-CM | POA: Diagnosis not present

## 2016-08-22 DIAGNOSIS — K219 Gastro-esophageal reflux disease without esophagitis: Secondary | ICD-10-CM | POA: Diagnosis not present

## 2016-08-22 DIAGNOSIS — M75102 Unspecified rotator cuff tear or rupture of left shoulder, not specified as traumatic: Secondary | ICD-10-CM | POA: Diagnosis not present

## 2016-08-22 DIAGNOSIS — M1990 Unspecified osteoarthritis, unspecified site: Secondary | ICD-10-CM | POA: Diagnosis not present

## 2016-08-22 DIAGNOSIS — Z85038 Personal history of other malignant neoplasm of large intestine: Secondary | ICD-10-CM | POA: Diagnosis not present

## 2016-08-22 DIAGNOSIS — M4800 Spinal stenosis, site unspecified: Secondary | ICD-10-CM | POA: Diagnosis not present

## 2016-08-22 DIAGNOSIS — Z8249 Family history of ischemic heart disease and other diseases of the circulatory system: Secondary | ICD-10-CM | POA: Diagnosis not present

## 2016-08-22 DIAGNOSIS — Z9049 Acquired absence of other specified parts of digestive tract: Secondary | ICD-10-CM | POA: Diagnosis not present

## 2016-08-22 DIAGNOSIS — R262 Difficulty in walking, not elsewhere classified: Secondary | ICD-10-CM | POA: Diagnosis not present

## 2016-08-22 DIAGNOSIS — I4891 Unspecified atrial fibrillation: Secondary | ICD-10-CM | POA: Diagnosis not present

## 2016-08-22 DIAGNOSIS — Z8261 Family history of arthritis: Secondary | ICD-10-CM | POA: Diagnosis not present

## 2016-08-22 DIAGNOSIS — Z933 Colostomy status: Secondary | ICD-10-CM | POA: Diagnosis not present

## 2016-08-22 DIAGNOSIS — Z888 Allergy status to other drugs, medicaments and biological substances status: Secondary | ICD-10-CM | POA: Diagnosis not present

## 2016-08-22 DIAGNOSIS — M810 Age-related osteoporosis without current pathological fracture: Secondary | ICD-10-CM | POA: Diagnosis not present

## 2016-08-22 DIAGNOSIS — R339 Retention of urine, unspecified: Secondary | ICD-10-CM | POA: Diagnosis not present

## 2016-08-22 DIAGNOSIS — Z7902 Long term (current) use of antithrombotics/antiplatelets: Secondary | ICD-10-CM | POA: Diagnosis not present

## 2016-08-22 DIAGNOSIS — S32020A Wedge compression fracture of second lumbar vertebra, initial encounter for closed fracture: Secondary | ICD-10-CM | POA: Diagnosis not present

## 2016-08-24 DIAGNOSIS — R339 Retention of urine, unspecified: Secondary | ICD-10-CM | POA: Diagnosis not present

## 2016-08-24 DIAGNOSIS — Z933 Colostomy status: Secondary | ICD-10-CM | POA: Diagnosis not present

## 2016-08-24 DIAGNOSIS — I4891 Unspecified atrial fibrillation: Secondary | ICD-10-CM | POA: Diagnosis not present

## 2016-08-24 DIAGNOSIS — Z888 Allergy status to other drugs, medicaments and biological substances status: Secondary | ICD-10-CM | POA: Diagnosis not present

## 2016-08-24 DIAGNOSIS — Z91041 Radiographic dye allergy status: Secondary | ICD-10-CM | POA: Diagnosis not present

## 2016-08-24 DIAGNOSIS — Z9049 Acquired absence of other specified parts of digestive tract: Secondary | ICD-10-CM | POA: Diagnosis not present

## 2016-08-24 DIAGNOSIS — K219 Gastro-esophageal reflux disease without esophagitis: Secondary | ICD-10-CM | POA: Diagnosis not present

## 2016-08-24 DIAGNOSIS — Z7901 Long term (current) use of anticoagulants: Secondary | ICD-10-CM | POA: Diagnosis not present

## 2016-08-24 DIAGNOSIS — Z8261 Family history of arthritis: Secondary | ICD-10-CM | POA: Diagnosis not present

## 2016-08-24 DIAGNOSIS — R262 Difficulty in walking, not elsewhere classified: Secondary | ICD-10-CM | POA: Diagnosis not present

## 2016-08-24 DIAGNOSIS — M810 Age-related osteoporosis without current pathological fracture: Secondary | ICD-10-CM | POA: Diagnosis not present

## 2016-08-24 DIAGNOSIS — Z79899 Other long term (current) drug therapy: Secondary | ICD-10-CM | POA: Diagnosis not present

## 2016-08-24 DIAGNOSIS — Z8249 Family history of ischemic heart disease and other diseases of the circulatory system: Secondary | ICD-10-CM | POA: Diagnosis not present

## 2016-08-24 DIAGNOSIS — M1990 Unspecified osteoarthritis, unspecified site: Secondary | ICD-10-CM | POA: Diagnosis not present

## 2016-08-24 DIAGNOSIS — S32020A Wedge compression fracture of second lumbar vertebra, initial encounter for closed fracture: Secondary | ICD-10-CM | POA: Diagnosis not present

## 2016-08-24 DIAGNOSIS — M75102 Unspecified rotator cuff tear or rupture of left shoulder, not specified as traumatic: Secondary | ICD-10-CM | POA: Diagnosis not present

## 2016-08-24 DIAGNOSIS — Z7902 Long term (current) use of antithrombotics/antiplatelets: Secondary | ICD-10-CM | POA: Diagnosis not present

## 2016-08-24 DIAGNOSIS — I10 Essential (primary) hypertension: Secondary | ICD-10-CM | POA: Diagnosis not present

## 2016-08-24 DIAGNOSIS — M1991 Primary osteoarthritis, unspecified site: Secondary | ICD-10-CM | POA: Diagnosis not present

## 2016-08-24 DIAGNOSIS — M4800 Spinal stenosis, site unspecified: Secondary | ICD-10-CM | POA: Diagnosis not present

## 2016-08-24 DIAGNOSIS — Z79891 Long term (current) use of opiate analgesic: Secondary | ICD-10-CM | POA: Diagnosis not present

## 2016-08-24 DIAGNOSIS — Z85038 Personal history of other malignant neoplasm of large intestine: Secondary | ICD-10-CM | POA: Diagnosis not present

## 2016-08-24 DIAGNOSIS — S32020D Wedge compression fracture of second lumbar vertebra, subsequent encounter for fracture with routine healing: Secondary | ICD-10-CM | POA: Diagnosis not present

## 2016-08-26 DIAGNOSIS — S32020D Wedge compression fracture of second lumbar vertebra, subsequent encounter for fracture with routine healing: Secondary | ICD-10-CM | POA: Diagnosis not present

## 2016-08-26 DIAGNOSIS — Z79891 Long term (current) use of opiate analgesic: Secondary | ICD-10-CM | POA: Diagnosis not present

## 2016-08-26 DIAGNOSIS — I10 Essential (primary) hypertension: Secondary | ICD-10-CM | POA: Diagnosis not present

## 2016-08-26 DIAGNOSIS — Z7901 Long term (current) use of anticoagulants: Secondary | ICD-10-CM | POA: Diagnosis not present

## 2016-08-26 DIAGNOSIS — I4891 Unspecified atrial fibrillation: Secondary | ICD-10-CM | POA: Diagnosis not present

## 2016-08-26 DIAGNOSIS — K219 Gastro-esophageal reflux disease without esophagitis: Secondary | ICD-10-CM | POA: Diagnosis not present

## 2016-08-26 DIAGNOSIS — M1991 Primary osteoarthritis, unspecified site: Secondary | ICD-10-CM | POA: Diagnosis not present

## 2016-08-28 DIAGNOSIS — Z79891 Long term (current) use of opiate analgesic: Secondary | ICD-10-CM | POA: Diagnosis not present

## 2016-08-28 DIAGNOSIS — I10 Essential (primary) hypertension: Secondary | ICD-10-CM | POA: Diagnosis not present

## 2016-08-28 DIAGNOSIS — M1991 Primary osteoarthritis, unspecified site: Secondary | ICD-10-CM | POA: Diagnosis not present

## 2016-08-28 DIAGNOSIS — K219 Gastro-esophageal reflux disease without esophagitis: Secondary | ICD-10-CM | POA: Diagnosis not present

## 2016-08-28 DIAGNOSIS — I4891 Unspecified atrial fibrillation: Secondary | ICD-10-CM | POA: Diagnosis not present

## 2016-08-28 DIAGNOSIS — S32020D Wedge compression fracture of second lumbar vertebra, subsequent encounter for fracture with routine healing: Secondary | ICD-10-CM | POA: Diagnosis not present

## 2016-08-28 DIAGNOSIS — Z7901 Long term (current) use of anticoagulants: Secondary | ICD-10-CM | POA: Diagnosis not present

## 2016-08-31 DIAGNOSIS — Z7901 Long term (current) use of anticoagulants: Secondary | ICD-10-CM | POA: Diagnosis not present

## 2016-08-31 DIAGNOSIS — I10 Essential (primary) hypertension: Secondary | ICD-10-CM | POA: Diagnosis not present

## 2016-08-31 DIAGNOSIS — I4891 Unspecified atrial fibrillation: Secondary | ICD-10-CM | POA: Diagnosis not present

## 2016-08-31 DIAGNOSIS — M545 Low back pain: Secondary | ICD-10-CM | POA: Diagnosis not present

## 2016-08-31 DIAGNOSIS — Z1389 Encounter for screening for other disorder: Secondary | ICD-10-CM | POA: Diagnosis not present

## 2016-08-31 DIAGNOSIS — Z79891 Long term (current) use of opiate analgesic: Secondary | ICD-10-CM | POA: Diagnosis not present

## 2016-08-31 DIAGNOSIS — Z6823 Body mass index (BMI) 23.0-23.9, adult: Secondary | ICD-10-CM | POA: Diagnosis not present

## 2016-08-31 DIAGNOSIS — M5136 Other intervertebral disc degeneration, lumbar region: Secondary | ICD-10-CM | POA: Diagnosis not present

## 2016-08-31 DIAGNOSIS — M1991 Primary osteoarthritis, unspecified site: Secondary | ICD-10-CM | POA: Diagnosis not present

## 2016-08-31 DIAGNOSIS — K219 Gastro-esophageal reflux disease without esophagitis: Secondary | ICD-10-CM | POA: Diagnosis not present

## 2016-08-31 DIAGNOSIS — M5410 Radiculopathy, site unspecified: Secondary | ICD-10-CM | POA: Diagnosis not present

## 2016-08-31 DIAGNOSIS — S32020D Wedge compression fracture of second lumbar vertebra, subsequent encounter for fracture with routine healing: Secondary | ICD-10-CM | POA: Diagnosis not present

## 2016-09-01 DIAGNOSIS — K219 Gastro-esophageal reflux disease without esophagitis: Secondary | ICD-10-CM | POA: Diagnosis not present

## 2016-09-01 DIAGNOSIS — I4891 Unspecified atrial fibrillation: Secondary | ICD-10-CM | POA: Diagnosis not present

## 2016-09-01 DIAGNOSIS — Z79891 Long term (current) use of opiate analgesic: Secondary | ICD-10-CM | POA: Diagnosis not present

## 2016-09-01 DIAGNOSIS — M1991 Primary osteoarthritis, unspecified site: Secondary | ICD-10-CM | POA: Diagnosis not present

## 2016-09-01 DIAGNOSIS — Z7901 Long term (current) use of anticoagulants: Secondary | ICD-10-CM | POA: Diagnosis not present

## 2016-09-01 DIAGNOSIS — I10 Essential (primary) hypertension: Secondary | ICD-10-CM | POA: Diagnosis not present

## 2016-09-01 DIAGNOSIS — S32020D Wedge compression fracture of second lumbar vertebra, subsequent encounter for fracture with routine healing: Secondary | ICD-10-CM | POA: Diagnosis not present

## 2016-09-02 DIAGNOSIS — K219 Gastro-esophageal reflux disease without esophagitis: Secondary | ICD-10-CM | POA: Diagnosis not present

## 2016-09-02 DIAGNOSIS — S32020D Wedge compression fracture of second lumbar vertebra, subsequent encounter for fracture with routine healing: Secondary | ICD-10-CM | POA: Diagnosis not present

## 2016-09-02 DIAGNOSIS — M1991 Primary osteoarthritis, unspecified site: Secondary | ICD-10-CM | POA: Diagnosis not present

## 2016-09-02 DIAGNOSIS — Z79891 Long term (current) use of opiate analgesic: Secondary | ICD-10-CM | POA: Diagnosis not present

## 2016-09-02 DIAGNOSIS — Z7901 Long term (current) use of anticoagulants: Secondary | ICD-10-CM | POA: Diagnosis not present

## 2016-09-02 DIAGNOSIS — I4891 Unspecified atrial fibrillation: Secondary | ICD-10-CM | POA: Diagnosis not present

## 2016-09-02 DIAGNOSIS — I10 Essential (primary) hypertension: Secondary | ICD-10-CM | POA: Diagnosis not present

## 2016-09-04 DIAGNOSIS — Z79891 Long term (current) use of opiate analgesic: Secondary | ICD-10-CM | POA: Diagnosis not present

## 2016-09-04 DIAGNOSIS — I10 Essential (primary) hypertension: Secondary | ICD-10-CM | POA: Diagnosis not present

## 2016-09-04 DIAGNOSIS — I4891 Unspecified atrial fibrillation: Secondary | ICD-10-CM | POA: Diagnosis not present

## 2016-09-04 DIAGNOSIS — S32020D Wedge compression fracture of second lumbar vertebra, subsequent encounter for fracture with routine healing: Secondary | ICD-10-CM | POA: Diagnosis not present

## 2016-09-04 DIAGNOSIS — Z7901 Long term (current) use of anticoagulants: Secondary | ICD-10-CM | POA: Diagnosis not present

## 2016-09-04 DIAGNOSIS — K219 Gastro-esophageal reflux disease without esophagitis: Secondary | ICD-10-CM | POA: Diagnosis not present

## 2016-09-04 DIAGNOSIS — M1991 Primary osteoarthritis, unspecified site: Secondary | ICD-10-CM | POA: Diagnosis not present

## 2016-09-09 DIAGNOSIS — K219 Gastro-esophageal reflux disease without esophagitis: Secondary | ICD-10-CM | POA: Diagnosis not present

## 2016-09-09 DIAGNOSIS — Z79891 Long term (current) use of opiate analgesic: Secondary | ICD-10-CM | POA: Diagnosis not present

## 2016-09-09 DIAGNOSIS — I4891 Unspecified atrial fibrillation: Secondary | ICD-10-CM | POA: Diagnosis not present

## 2016-09-09 DIAGNOSIS — I10 Essential (primary) hypertension: Secondary | ICD-10-CM | POA: Diagnosis not present

## 2016-09-09 DIAGNOSIS — M1991 Primary osteoarthritis, unspecified site: Secondary | ICD-10-CM | POA: Diagnosis not present

## 2016-09-09 DIAGNOSIS — S32020D Wedge compression fracture of second lumbar vertebra, subsequent encounter for fracture with routine healing: Secondary | ICD-10-CM | POA: Diagnosis not present

## 2016-09-09 DIAGNOSIS — Z7901 Long term (current) use of anticoagulants: Secondary | ICD-10-CM | POA: Diagnosis not present

## 2016-09-24 DIAGNOSIS — M5412 Radiculopathy, cervical region: Secondary | ICD-10-CM | POA: Insufficient documentation

## 2016-09-24 DIAGNOSIS — M47816 Spondylosis without myelopathy or radiculopathy, lumbar region: Secondary | ICD-10-CM | POA: Diagnosis not present

## 2016-09-24 DIAGNOSIS — S32020D Wedge compression fracture of second lumbar vertebra, subsequent encounter for fracture with routine healing: Secondary | ICD-10-CM | POA: Diagnosis not present

## 2016-09-24 DIAGNOSIS — M25512 Pain in left shoulder: Secondary | ICD-10-CM | POA: Insufficient documentation

## 2016-10-09 DIAGNOSIS — M5410 Radiculopathy, site unspecified: Secondary | ICD-10-CM | POA: Diagnosis not present

## 2016-10-13 ENCOUNTER — Other Ambulatory Visit: Payer: Self-pay | Admitting: Cardiovascular Disease

## 2016-10-15 DIAGNOSIS — G8911 Acute pain due to trauma: Secondary | ICD-10-CM | POA: Diagnosis present

## 2016-10-15 DIAGNOSIS — M25512 Pain in left shoulder: Secondary | ICD-10-CM | POA: Diagnosis present

## 2016-10-15 DIAGNOSIS — M75122 Complete rotator cuff tear or rupture of left shoulder, not specified as traumatic: Secondary | ICD-10-CM | POA: Diagnosis not present

## 2016-10-26 DIAGNOSIS — K219 Gastro-esophageal reflux disease without esophagitis: Secondary | ICD-10-CM | POA: Diagnosis not present

## 2016-10-26 DIAGNOSIS — G8911 Acute pain due to trauma: Secondary | ICD-10-CM | POA: Diagnosis not present

## 2016-10-26 DIAGNOSIS — Z8261 Family history of arthritis: Secondary | ICD-10-CM | POA: Diagnosis not present

## 2016-10-26 DIAGNOSIS — M25512 Pain in left shoulder: Secondary | ICD-10-CM | POA: Diagnosis not present

## 2016-10-26 DIAGNOSIS — Z85038 Personal history of other malignant neoplasm of large intestine: Secondary | ICD-10-CM | POA: Diagnosis not present

## 2016-10-26 DIAGNOSIS — I10 Essential (primary) hypertension: Secondary | ICD-10-CM | POA: Diagnosis not present

## 2016-10-26 DIAGNOSIS — M75122 Complete rotator cuff tear or rupture of left shoulder, not specified as traumatic: Secondary | ICD-10-CM | POA: Diagnosis not present

## 2016-10-26 DIAGNOSIS — M19012 Primary osteoarthritis, left shoulder: Secondary | ICD-10-CM | POA: Diagnosis not present

## 2016-10-26 DIAGNOSIS — Z8249 Family history of ischemic heart disease and other diseases of the circulatory system: Secondary | ICD-10-CM | POA: Diagnosis not present

## 2016-10-26 DIAGNOSIS — Z888 Allergy status to other drugs, medicaments and biological substances status: Secondary | ICD-10-CM | POA: Diagnosis not present

## 2016-10-26 DIAGNOSIS — M810 Age-related osteoporosis without current pathological fracture: Secondary | ICD-10-CM | POA: Diagnosis not present

## 2016-10-26 DIAGNOSIS — Z981 Arthrodesis status: Secondary | ICD-10-CM | POA: Diagnosis not present

## 2016-10-26 DIAGNOSIS — Z9049 Acquired absence of other specified parts of digestive tract: Secondary | ICD-10-CM | POA: Diagnosis not present

## 2016-10-26 DIAGNOSIS — Z881 Allergy status to other antibiotic agents status: Secondary | ICD-10-CM | POA: Diagnosis not present

## 2016-10-26 DIAGNOSIS — Z883 Allergy status to other anti-infective agents status: Secondary | ICD-10-CM | POA: Diagnosis not present

## 2016-10-26 DIAGNOSIS — M7522 Bicipital tendinitis, left shoulder: Secondary | ICD-10-CM | POA: Diagnosis not present

## 2016-10-26 DIAGNOSIS — I4891 Unspecified atrial fibrillation: Secondary | ICD-10-CM | POA: Diagnosis not present

## 2016-10-26 DIAGNOSIS — Z91041 Radiographic dye allergy status: Secondary | ICD-10-CM | POA: Diagnosis not present

## 2016-10-27 DIAGNOSIS — Z881 Allergy status to other antibiotic agents status: Secondary | ICD-10-CM | POA: Diagnosis not present

## 2016-10-27 DIAGNOSIS — Z8249 Family history of ischemic heart disease and other diseases of the circulatory system: Secondary | ICD-10-CM | POA: Diagnosis not present

## 2016-10-27 DIAGNOSIS — M7522 Bicipital tendinitis, left shoulder: Secondary | ICD-10-CM | POA: Diagnosis not present

## 2016-10-27 DIAGNOSIS — Z85038 Personal history of other malignant neoplasm of large intestine: Secondary | ICD-10-CM | POA: Diagnosis not present

## 2016-10-27 DIAGNOSIS — G8911 Acute pain due to trauma: Secondary | ICD-10-CM | POA: Diagnosis not present

## 2016-10-27 DIAGNOSIS — Z8261 Family history of arthritis: Secondary | ICD-10-CM | POA: Diagnosis not present

## 2016-10-27 DIAGNOSIS — M25512 Pain in left shoulder: Secondary | ICD-10-CM | POA: Diagnosis not present

## 2016-10-27 DIAGNOSIS — Z888 Allergy status to other drugs, medicaments and biological substances status: Secondary | ICD-10-CM | POA: Diagnosis not present

## 2016-10-27 DIAGNOSIS — I4891 Unspecified atrial fibrillation: Secondary | ICD-10-CM | POA: Diagnosis not present

## 2016-10-27 DIAGNOSIS — M19012 Primary osteoarthritis, left shoulder: Secondary | ICD-10-CM | POA: Diagnosis not present

## 2016-10-27 DIAGNOSIS — Z883 Allergy status to other anti-infective agents status: Secondary | ICD-10-CM | POA: Diagnosis not present

## 2016-10-27 DIAGNOSIS — Z9049 Acquired absence of other specified parts of digestive tract: Secondary | ICD-10-CM | POA: Diagnosis not present

## 2016-10-27 DIAGNOSIS — Z91041 Radiographic dye allergy status: Secondary | ICD-10-CM | POA: Diagnosis not present

## 2016-10-27 DIAGNOSIS — I10 Essential (primary) hypertension: Secondary | ICD-10-CM | POA: Diagnosis not present

## 2016-10-27 DIAGNOSIS — Z981 Arthrodesis status: Secondary | ICD-10-CM | POA: Diagnosis not present

## 2016-10-27 DIAGNOSIS — M75122 Complete rotator cuff tear or rupture of left shoulder, not specified as traumatic: Secondary | ICD-10-CM | POA: Diagnosis not present

## 2016-10-27 DIAGNOSIS — K219 Gastro-esophageal reflux disease without esophagitis: Secondary | ICD-10-CM | POA: Diagnosis not present

## 2016-10-27 DIAGNOSIS — M810 Age-related osteoporosis without current pathological fracture: Secondary | ICD-10-CM | POA: Diagnosis not present

## 2016-11-05 DIAGNOSIS — M75122 Complete rotator cuff tear or rupture of left shoulder, not specified as traumatic: Secondary | ICD-10-CM | POA: Diagnosis not present

## 2016-11-05 DIAGNOSIS — Z9889 Other specified postprocedural states: Secondary | ICD-10-CM | POA: Insufficient documentation

## 2016-11-11 ENCOUNTER — Encounter (HOSPITAL_COMMUNITY): Payer: Self-pay | Admitting: Occupational Therapy

## 2016-11-11 ENCOUNTER — Ambulatory Visit (HOSPITAL_COMMUNITY): Payer: Medicare Other | Attending: Orthopedic Surgery | Admitting: Occupational Therapy

## 2016-11-11 DIAGNOSIS — R29898 Other symptoms and signs involving the musculoskeletal system: Secondary | ICD-10-CM | POA: Insufficient documentation

## 2016-11-11 DIAGNOSIS — M25512 Pain in left shoulder: Secondary | ICD-10-CM | POA: Diagnosis not present

## 2016-11-11 DIAGNOSIS — M25612 Stiffness of left shoulder, not elsewhere classified: Secondary | ICD-10-CM | POA: Diagnosis not present

## 2016-11-11 NOTE — Therapy (Signed)
[place], [place], [address] Phone: [phone]   Fax:  [phone]  Occupational Therapy Evaluation  Patient Details  Name: [name] MRN: [id number] Date of Birth: [birth year] Referring Provider: Dr. Remo Lipps Case  Encounter Date: 11/11/2016      OT End of Session - 11/11/16 3662    Visit Number 1   Number of Visits 16   Date for OT Re-Evaluation 01/10/17  mini-reassessment 12/10/2016   Authorization Type U[employer] Medicare; $40 copay   Authorization Time Period Before 10th visit   Authorization - Visit Number 1   Authorization - Number of Visits 10   OT Start Time 1304   OT Stop Time 1340   OT Time Calculation (min) 36 min   Activity Tolerance Patient tolerated treatment well   Behavior During Therapy [facility] for tasks assessed/performed      Past Medical History:  Diagnosis Date  . Cervicalgia   . Colon cancer ([place])   . Degeneration of cervical intervertebral disc   . Hypertension   . Pinched nerve in shoulder   . Rheumatoid arthritis(714.0)   . RUQ pain   . Vulvar dystrophy   . Yeast infection     Past Surgical History:  Procedure Laterality Date  . ABDOMINAL HYSTERECTOMY  1960'S  . APPENDECTOMY  with hysterectomy  . BACK SURGERY  12/08/10   plate/screws C3 C4  . CHOLECYSTECTOMY  2002  . COLONOSCOPY  04/02/2009  . COLONOSCOPY  06/04/05  . COLONOSCOPY N/A 07/26/2015   Procedure: COLONOSCOPY;  Surgeon: Rogene Houston, MD;  [facility];  Service: Endoscopy;  Laterality: N/A;  1240  . COLONOSCOPY WITH ESOPHAGOGASTRODUODENOSCOPY (EGD) N/A 04/08/2012   Procedure: COLONOSCOPY WITH ESOPHAGOGASTRODUODENOSCOPY (EGD);  Surgeon: Rogene Houston, MD;  [facility];  Service: Endoscopy;  Laterality: N/A;  130-moved to 245 Ann notified pt  . ESOPHAGOGASTRODUODENOSCOPY N/A 07/26/2015   Procedure: ESOPHAGOGASTRODUODENOSCOPY (EGD);  Surgeon: Rogene Houston, MD;  [facility];   Service: Endoscopy;  Laterality: N/A;  . HEMICOLECTOMY  10/05    There were no vitals filed for this visit.      Subjective Assessment - 11/11/16 1342    Subjective  S: I've been propping my arm up and not wearing that neck strap.    Pertinent History Pt is an 83 y/o female s/p left RCR on 10/27/16 after injuring her RC in April during a fall. Pt has been using pain medication for pain management. Pt was referred to occupational therapy for evaluation and treatment by Dr. Remo Lipps Case.    Special Tests FOTO Score: 4/100 (96% impairment)   Patient Stated Goals To be able to use my arm again.    Currently in Pain? No/denies           [facility] OT Assessment - 11/11/16 1257      Assessment   Diagnosis s/p Left RCR   Referring Provider Dr. Remo Lipps Case   Onset Date 10/27/16   Prior Therapy None     Precautions   Precautions Shoulder   Type of Shoulder Precautions See protocol: weeks 0-6 (10/16-11/27) P/ROM, pendulums, isometrics. Weeks 6-12 (11/27-1/1): AA/ROM and progress as tolerated. No strengthening until 12 weeks   Shoulder Interventions Shoulder sling/immobilizer;At all times     Restrictions   Weight Bearing Restrictions No     Balance Screen   Has the patient fallen in the past 6 months Yes   How many times? 1  Has the patient had a decrease in activity level because of a fear of falling?  Yes   Is the patient reluctant to leave their home because of a fear of falling?  Yes     Home  Environment   Family/patient expects to be discharged to: Private residence   [place]     ADL   ADL comments Pt is unable to use LUE for ADL completion. Pt is having difficulty with bathing, dressing, grooming, and meal preparation.      Written Expression   Dominant Hand Left     Cognition   Overall Cognitive Status Within Functional Limits for tasks assessed     ROM / Strength   AROM /  PROM / Strength PROM;AROM;Strength     Palpation   Palpation comment Moderate fascial restrictions along left upper arm, trapezius, and scapularis regions     AROM   Overall AROM  Deficits;Unable to assess;Due to precautions     PROM   Overall PROM Comments Assessed supine, er/IR adducted   PROM Assessment Site Shoulder   Right/Left Shoulder Left   Left Shoulder Flexion 125 Degrees   Left Shoulder ABduction 96 Degrees   Left Shoulder Internal Rotation 90 Degrees   Left Shoulder External Rotation 61 Degrees     Strength   Overall Strength Deficits;Unable to assess;Due to precautions                         OT Education - 11/11/16 1326    Education provided Yes   Education Details table slides   Person(s) Educated Patient   Methods Explanation;Demonstration;Handout   Comprehension Verbalized understanding;Returned demonstration          OT Short Term Goals - 11/11/16 1420      OT SHORT TERM GOAL #1   Title Pt will be provided with and educated on HEP to improve mobility required to use LUE as dominant during daily tasks.    Time 4   Period Weeks   Status New   Target Date 12/11/16     OT SHORT TERM GOAL #2   Title Pt will decrease pain to 5/10 in LUE to improve ability to sleep at night.    Time 4   Period Weeks   Status New     OT SHORT TERM GOAL #3   Title Pt will improve LUE P/ROM to [facility] to improve ability to donn shirts with compensatory strategies.    Time 4   Period Weeks   Status New           OT Long Term Goals - 11/11/16 1424      OT LONG TERM GOAL #1   Title Pt will return to highest level of functioning and independence using LUE as dominant during daily tasks.    Time 8   Period Weeks   Status New   Target Date 01/10/17     OT LONG TERM GOAL #2   Title Pt will decrease LUE pain to 3/10 or less to improve ability to perform grooming tasks using LUE as dominant.    Time 8   Period Weeks   Status New     OT LONG TERM  GOAL #3   Title Pt will decrease LUE fascial restrictions from mod to minimal amounts or less to improve mobility required for functional reaching tasks.  Time 8   Period Weeks   Status New     OT LONG TERM GOAL #4   Title Pt will improve LUE A/ROM to [facility] to improve ability to complete bathing tasks using LUE as dominant.    Time 8   Period Weeks   Status New     OT LONG TERM GOAL #5   Title Pt will improve LUE strength to 4/5 to improve ability to perform meal preparation tasks.    Time 8   Period Weeks   Status New               Plan - December 04, 2016 1348    Clinical Impression Statement A: Pt is an 83 y/o female s/p left RCR on 10/27/16 presenting with decreased use of dominant LUE during ADL completion due to pain, increased fascial restrictions, ROM and strength deficits. Pt was provided with and educated on table slides for HEP.  Pt is unable to attend therapy 3x/week due to high copay, is agreeable to begin with 2x/week with potential to decrease to 1x as treatment progress and function improves.    Occupational Profile and client history currently impacting functional performance Pt was independent in ADL tasks prior to surgery and is motivated to return to prior level of functioning.    Occupational performance deficits (Please refer to evaluation for details): ADL's;IADL's;Rest and Sleep;Leisure;Social Participation   Rehab Potential Good   OT Frequency 2x / week   OT Duration 8 weeks   OT Treatment/Interventions Self-care/ADL training;Therapeutic exercise;Patient/family education;Manual Therapy;Ultrasound;Therapeutic activities;Cryotherapy;Electrical Stimulation;Passive range of motion;Moist Heat   Plan P: Pt will benefit from skilled OT services to decrease pain and fascial restrictions, and improve ROM, strength, and functional use of LUE as dominant. Treatment plan: myofascial release, manual therapy, P/ROM, AA/ROM, A/ROM, general LUE strenthening, scapular stability and  strengthening, modalities prn.    Clinical Decision Making Limited treatment options, no task modification necessary   OT Home Exercise Plan 12/05/22: table slides   Consulted and Agree with Plan of Care Patient      Patient will benefit from skilled therapeutic intervention in order to improve the following deficits and impairments:  Decreased activity tolerance, Decreased strength, Impaired flexibility, Decreased range of motion, Pain, Impaired UE functional use, Increased fascial restricitons  Visit Diagnosis: Acute pain of left shoulder  Stiffness of left shoulder, not elsewhere classified  Other symptoms and signs involving the musculoskeletal system      G-Codes - Dec 04, 2016 1508    Functional Assessment Tool Used (Outpatient only) FOTO Score: 4/100 (96% impairment)   Functional Limitation Carrying, moving and handling objects   Carrying, Moving and Handling Objects Current Status (Y1017) At least 80 percent but less than 100 percent impaired, limited or restricted   Carrying, Moving and Handling Objects Goal Status (P1025) At least 40 percent but less than 60 percent impaired, limited or restricted      Problem List Patient Active Problem List   Diagnosis Date Noted  . Vulvar dystrophy 05/23/2014  . Atrial fibrillation ([place]) 08/10/2013  . Essential hypertension 07/04/2013  . Tachycardia 01/31/2013  . Dystrophy, vulva 05/04/2012  . Abdominal pain, epigastric 03/28/2012  . Dyspepsia 04/14/2011  . History of colon cancer 03/17/2011  . Diarrhea 03/17/2011  . HYPERLIPIDEMIA 11/30/2006  . BRONCHIECTASIS 11/30/2006  . RHEUMATOID ARTHRITIS 11/30/2006   Guadelupe Sabin, OTR/L  [phone] December 04, 2016, 4:34 PM  [place] [address] [place], [place], [address] Phone: [phone]   Fax:  [phone]  Name: [name] MRN: [id number] Date of Birth: [birth year]

## 2016-11-11 NOTE — Patient Instructions (Signed)
SHOULDER: Flexion On Table   Place hands on table, elbows straight. Move hips away from body. Press hands down into table.  __10-15_ reps per set, _3__ sets per day  Abduction (Passive)   With arm out to side, resting on table, lower head toward arm, keeping trunk away from table.  Repeat _10-15___ times. Do _3___ sessions per day.  Copyright  VHI. All rights reserved.     Internal Rotation (Assistive)   Seated with elbow bent at right angle and held against side, slide arm on table surface in an inward arc. Repeat _10-15___ times. Do __3__ sessions per day. Activity: Use this motion to brush crumbs off the table.  Copyright  VHI. All rights reserved.

## 2016-11-13 ENCOUNTER — Other Ambulatory Visit: Payer: Self-pay

## 2016-11-13 MED ORDER — LISINOPRIL 20 MG PO TABS: 20.0000 mg | ORAL_TABLET | Freq: Two times a day (BID) | ORAL | 3 refills | Status: DC

## 2016-11-13 NOTE — Telephone Encounter (Signed)
Refilled lisinopril 20 mg to [facility]

## 2016-11-17 ENCOUNTER — Ambulatory Visit (HOSPITAL_COMMUNITY): Payer: Medicare Other | Attending: Orthopedic Surgery | Admitting: Occupational Therapy

## 2016-11-17 ENCOUNTER — Encounter (HOSPITAL_COMMUNITY): Payer: Self-pay | Admitting: Occupational Therapy

## 2016-11-17 DIAGNOSIS — R29898 Other symptoms and signs involving the musculoskeletal system: Secondary | ICD-10-CM | POA: Diagnosis not present

## 2016-11-17 DIAGNOSIS — M25512 Pain in left shoulder: Secondary | ICD-10-CM | POA: Diagnosis not present

## 2016-11-17 DIAGNOSIS — M25612 Stiffness of left shoulder, not elsewhere classified: Secondary | ICD-10-CM | POA: Diagnosis not present

## 2016-11-17 NOTE — Therapy (Signed)
[place], [place], [address] Phone: [phone]   Fax:  [phone]  Occupational Therapy Treatment  Patient Details  Name: [name] MRN: [id number] Date of Birth: [birth year] Referring Provider: Dr. Remo Lipps Case   Encounter Date: 11/17/2016  OT End of Session - 11/17/16 1517    Visit Number  2    Number of Visits  16    Date for OT Re-Evaluation  01/10/17 mini-reassessment 12/10/2016     Authorization Type  [employer]HC Medicare; $40 copay    Authorization Time Period  Before 10th visit    Authorization - Visit Number  2    Authorization - Number of Visits  10    OT Start Time  4196    OT Stop Time  1515    OT Time Calculation (min)  43 min    Activity Tolerance  Patient tolerated treatment well    Behavior During Therapy  [facility] for tasks assessed/performed       Past Medical History:  Diagnosis Date  . Cervicalgia   . Colon cancer ([place])   . Degeneration of cervical intervertebral disc   . Hypertension   . Pinched nerve in shoulder   . Rheumatoid arthritis(714.0)   . RUQ pain   . Vulvar dystrophy   . Yeast infection     Past Surgical History:  Procedure Laterality Date  . ABDOMINAL HYSTERECTOMY  1960'S  . APPENDECTOMY  with hysterectomy  . BACK SURGERY  12/08/10   plate/screws C3 C4  . CHOLECYSTECTOMY  2002  . COLONOSCOPY  04/02/2009  . COLONOSCOPY  06/04/05  . HEMICOLECTOMY  10/05    There were no vitals filed for this visit.  Subjective Assessment - 11/17/16 1431    Subjective   S: It gets sore with those exercises sometimes but I guess that's normal.     Currently in Pain?  No/denies         [facility] OT Assessment - 11/17/16 1431      Assessment   Diagnosis  s/p Left RCR      Precautions   Precautions  Shoulder    Type of Shoulder Precautions  See protocol: weeks 0-6 (10/16-11/27) P/ROM, pendulums, isometrics. Weeks 6-12 (11/27-1/1): AA/ROM and progress as tolerated. No strengthening  until 12 weeks    Shoulder Interventions  Shoulder sling/immobilizer;At all times               OT Treatments/Exercises (OP) - 11/17/16 1435      Exercises   Exercises  Shoulder      Shoulder Exercises: Supine   Protraction  PROM;10 reps    Horizontal ABduction  PROM;10 reps    External Rotation  PROM;10 reps    Internal Rotation  PROM;10 reps    Flexion  PROM;10 reps    ABduction  PROM;10 reps      Shoulder Exercises: Seated   Elevation  AROM;10 reps    Extension  AROM;10 reps    Row  AROM;10 reps      Shoulder Exercises: Therapy Ball   Flexion  10 reps    ABduction  10 reps      Shoulder Exercises: Isometric Strengthening   Flexion  Supine;3X3"    Extension  Supine;3X3"    External Rotation  Supine;3X3"    Internal Rotation  Supine;3X3"    ABduction  Supine;3X3"    ADduction  Supine;3X3"      Manual Therapy   Manual Therapy  Myofascial release  Manual therapy comments  Completed separately from therapeutic exercises    Myofascial Release  Myofascial release to left upper arm, trapezius, and scapularis regions to decrease pain and fascial restrictions and increase joint range of motion.              OT Education - 11/17/16 1516    Education provided  Yes    Education Details  scapular A/ROM    Person(s) Educated  Patient    Methods  Explanation;Demonstration;Handout    Comprehension  Verbalized understanding;Returned demonstration       OT Short Term Goals - 11/17/16 1519      OT SHORT TERM GOAL #1   Title  Pt will be provided with and educated on HEP to improve mobility required to use LUE as dominant during daily tasks.     Time  4    Period  Weeks    Status  On-going      OT SHORT TERM GOAL #2   Title  Pt will decrease pain to 5/10 in LUE to improve ability to sleep at night.     Time  4    Period  Weeks    Status  On-going      OT SHORT TERM GOAL #3   Title  Pt will improve LUE P/ROM to [facility] to improve ability to donn shirts with  compensatory strategies.     Time  4    Period  Weeks    Status  On-going        OT Long Term Goals - 11/17/16 1519      OT LONG TERM GOAL #1   Title  Pt will return to highest level of functioning and independence using LUE as dominant during daily tasks.     Time  8    Period  Weeks    Status  On-going      OT LONG TERM GOAL #2   Title  Pt will decrease LUE pain to 3/10 or less to improve ability to perform grooming tasks using LUE as dominant.     Time  8    Period  Weeks    Status  On-going      OT LONG TERM GOAL #3   Title  Pt will decrease LUE fascial restrictions from mod to minimal amounts or less to improve mobility required for functional reaching tasks.     Time  8    Period  Weeks    Status  On-going      OT LONG TERM GOAL #4   Title  Pt will improve LUE A/ROM to [facility] to improve ability to complete bathing tasks using LUE as dominant.     Time  8    Period  Weeks    Status  On-going      OT LONG TERM GOAL #5   Title  Pt will improve LUE strength to 4/5 to improve ability to perform meal preparation tasks.     Time  8    Period  Weeks    Status  On-going            Plan - 11/17/16 1517    Clinical Impression Statement  A: Initiated myofascial release, manual therapy, P/ROM, scapular A/ROM, isometrics, and therapy ball circles. Verbal cuing throughout for form and technique. Pt reports abduction is the most difficult motion on HEP.     Plan  P: Continue with P/ROM worktin towards [facility] as pt is able to tolerate. Add shoulder  glides, follow up on HEP    OT Home Exercise Plan  10/31: table slides; 11/6: scapular A/ROM    Consulted and Agree with Plan of Care  Patient       Patient will benefit from skilled therapeutic intervention in order to improve the following deficits and impairments:  Decreased activity tolerance, Decreased strength, Impaired flexibility, Decreased range of motion, Pain, Impaired UE functional use, Increased fascial  restricitons  Visit Diagnosis: Acute pain of left shoulder  Stiffness of left shoulder, not elsewhere classified  Other symptoms and signs involving the musculoskeletal system    Problem List Patient Active Problem List   Diagnosis Date Noted  . Vulvar dystrophy 05/23/2014  . Atrial fibrillation ([place]) 08/10/2013  . Essential hypertension 07/04/2013  . Tachycardia 01/31/2013  . Dystrophy, vulva 05/04/2012  . Abdominal pain, epigastric 03/28/2012  . Dyspepsia 04/14/2011  . History of colon cancer 03/17/2011  . Diarrhea 03/17/2011  . HYPERLIPIDEMIA 11/30/2006  . BRONCHIECTASIS 11/30/2006  . RHEUMATOID ARTHRITIS 11/30/2006   Guadelupe Sabin, OTR/L  [phone] 11/17/2016, 3:19 PM  [place] [address] [place], [place], [address] Phone: [phone]   Fax:  [phone]  Name: [name] MRN: [id number] Date of Birth: 09/07/1933

## 2016-11-17 NOTE — Patient Instructions (Signed)
1) Seated Row   Sit up straight with elbows by your sides. Pull back with shoulders/elbows, keeping forearms straight, as if pulling back on the reins of a horse. Squeeze shoulder blades together. Repeat _10__times, __2__sets/day    2) Shoulder Elevation    Sit up straight with arms by your sides. Slowly bring your shoulders up towards your ears. Repeat_10__times, __2__ sets/day    3) Shoulder Extension    Sit up straight with both arms by your side, draw your arms back behind your waist. Keep your elbows straight. Repeat __10__times, _2___sets/day.        

## 2016-11-18 DIAGNOSIS — H6122 Impacted cerumen, left ear: Secondary | ICD-10-CM | POA: Diagnosis not present

## 2016-11-18 DIAGNOSIS — M75102 Unspecified rotator cuff tear or rupture of left shoulder, not specified as traumatic: Secondary | ICD-10-CM | POA: Diagnosis not present

## 2016-11-18 DIAGNOSIS — Z6823 Body mass index (BMI) 23.0-23.9, adult: Secondary | ICD-10-CM | POA: Diagnosis not present

## 2016-11-19 ENCOUNTER — Ambulatory Visit (HOSPITAL_COMMUNITY): Payer: Medicare Other

## 2016-11-19 ENCOUNTER — Encounter (HOSPITAL_COMMUNITY): Payer: Self-pay

## 2016-11-19 ENCOUNTER — Ambulatory Visit (INDEPENDENT_AMBULATORY_CARE_PROVIDER_SITE_OTHER): Payer: Medicare Other | Admitting: Otolaryngology

## 2016-11-19 DIAGNOSIS — H900 Conductive hearing loss, bilateral: Secondary | ICD-10-CM

## 2016-11-19 DIAGNOSIS — R29898 Other symptoms and signs involving the musculoskeletal system: Secondary | ICD-10-CM

## 2016-11-19 DIAGNOSIS — M25612 Stiffness of left shoulder, not elsewhere classified: Secondary | ICD-10-CM | POA: Diagnosis not present

## 2016-11-19 DIAGNOSIS — M25512 Pain in left shoulder: Secondary | ICD-10-CM

## 2016-11-19 DIAGNOSIS — H6123 Impacted cerumen, bilateral: Secondary | ICD-10-CM

## 2016-11-19 NOTE — Therapy (Signed)
[place], [place], [address] Phone: [phone]   Fax:  [phone]  Occupational Therapy Treatment  Patient Details  Name: [name] MRN: [id number] Date of Birth: October 13, 1933 Referring Provider: Dr. Remo Lipps Case   Encounter Date: 11/19/2016  OT End of Session - 11/19/16 1454    Visit Number  3    Number of Visits  16    Date for OT Re-Evaluation  01/10/17 mini-reassessment 12/10/2016    Authorization Type  [employer]HC Medicare; $40 copay    Authorization Time Period  Before 10th visit    Authorization - Visit Number  3    Authorization - Number of Visits  10    OT Start Time  1435    OT Stop Time  1510    OT Time Calculation (min)  35 min    Activity Tolerance  Patient tolerated treatment well    Behavior During Therapy  [facility] for tasks assessed/performed       Past Medical History:  Diagnosis Date  . Cervicalgia   . Colon cancer ([place])   . Degeneration of cervical intervertebral disc   . Hypertension   . Pinched nerve in shoulder   . Rheumatoid arthritis(714.0)   . RUQ pain   . Vulvar dystrophy   . Yeast infection     Past Surgical History:  Procedure Laterality Date  . ABDOMINAL HYSTERECTOMY  1960'S  . APPENDECTOMY  with hysterectomy  . BACK SURGERY  12/08/10   plate/screws C3 C4  . CHOLECYSTECTOMY  2002  . COLONOSCOPY  04/02/2009  . COLONOSCOPY  06/04/05  . HEMICOLECTOMY  10/05    There were no vitals filed for this visit.  Subjective Assessment - 11/19/16 1454    Subjective   S: I don't hurt right now. Unless I move it the wrong way it's ok.    Currently in Pain?  No/denies                   OT Treatments/Exercises (OP) - 11/19/16 0001      Exercises   Exercises  Shoulder      Shoulder Exercises: Supine   Protraction  PROM;10 reps    Horizontal ABduction  PROM;10 reps    External Rotation  PROM;10 reps    Internal Rotation  PROM;10 reps    Flexion  PROM;10 reps     ABduction  PROM;10 reps      Shoulder Exercises: ROM/Strengthening   Thumb Tacks  1'    Anterior Glide  5x5"    Caudal Glide  5x5"    Prot/Ret//Elev/Dep  1'      Shoulder Exercises: Isometric Strengthening   Flexion  Supine;3X3"    Extension  Supine;3X3"    External Rotation  Supine;3X3"    Internal Rotation  Supine;3X3"    ABduction  Supine;3X3"    ADduction  Supine;3X3"      Manual Therapy   Manual Therapy  Myofascial release    Manual therapy comments  Completed separately from therapeutic exercises    Myofascial Release  Myofascial release to left upper arm, trapezius, and scapularis regions to decrease pain and fascial restrictions and increase joint range of motion.                OT Short Term Goals - 11/17/16 1519      OT SHORT TERM GOAL #1   Title  Pt will be provided with and educated on HEP to improve mobility required  to use LUE as dominant during daily tasks.     Time  4    Period  Weeks    Status  On-going      OT SHORT TERM GOAL #2   Title  Pt will decrease pain to 5/10 in LUE to improve ability to sleep at night.     Time  4    Period  Weeks    Status  On-going      OT SHORT TERM GOAL #3   Title  Pt will improve LUE P/ROM to [facility] to improve ability to donn shirts with compensatory strategies.     Time  4    Period  Weeks    Status  On-going        OT Long Term Goals - 11/17/16 1519      OT LONG TERM GOAL #1   Title  Pt will return to highest level of functioning and independence using LUE as dominant during daily tasks.     Time  8    Period  Weeks    Status  On-going      OT LONG TERM GOAL #2   Title  Pt will decrease LUE pain to 3/10 or less to improve ability to perform grooming tasks using LUE as dominant.     Time  8    Period  Weeks    Status  On-going      OT LONG TERM GOAL #3   Title  Pt will decrease LUE fascial restrictions from mod to minimal amounts or less to improve mobility required for functional reaching tasks.      Time  8    Period  Weeks    Status  On-going      OT LONG TERM GOAL #4   Title  Pt will improve LUE A/ROM to [facility] to improve ability to complete bathing tasks using LUE as dominant.     Time  8    Period  Weeks    Status  On-going      OT LONG TERM GOAL #5   Title  Pt will improve LUE strength to 4/5 to improve ability to perform meal preparation tasks.     Time  8    Period  Weeks    Status  On-going            Plan - 11/19/16 1515    Clinical Impression Statement  A: Continued with P/ROM, isometrics, and therapy ball stretches. Added shoulder glides and thumb tacks. Pt experienced increased pain with passive stretching during the decent of flexion. Able to tolerate stretching. VC for form and technique.     Plan  P: Continue with P/ROM and try to increase functional mobility needed to complete daily tasks. Attempt pro/ret/elev/dep       Patient will benefit from skilled therapeutic intervention in order to improve the following deficits and impairments:  Decreased activity tolerance, Decreased strength, Impaired flexibility, Decreased range of motion, Pain, Impaired UE functional use, Increased fascial restricitons  Visit Diagnosis: Acute pain of left shoulder  Stiffness of left shoulder, not elsewhere classified  Other symptoms and signs involving the musculoskeletal system    Problem List Patient Active Problem List   Diagnosis Date Noted  . Vulvar dystrophy 05/23/2014  . Atrial fibrillation ([place]) 08/10/2013  . Essential hypertension 07/04/2013  . Tachycardia 01/31/2013  . Dystrophy, vulva 05/04/2012  . Abdominal pain, epigastric 03/28/2012  . Dyspepsia 04/14/2011  . History of colon cancer 03/17/2011  .  Diarrhea 03/17/2011  . HYPERLIPIDEMIA 11/30/2006  . BRONCHIECTASIS 11/30/2006  . RHEUMATOID ARTHRITIS 11/30/2006   Ailene Ravel, OTR/L,CBIS  [phone]  11/19/2016, 3:36 PM  [place], [place], [address] Phone: [phone]   Fax:  [phone]  Name: [name] MRN: [id number] Date of Birth: Sep 27, 1933

## 2016-11-24 ENCOUNTER — Ambulatory Visit (HOSPITAL_COMMUNITY): Payer: Medicare Other

## 2016-11-24 ENCOUNTER — Encounter (HOSPITAL_COMMUNITY): Payer: Self-pay

## 2016-11-24 DIAGNOSIS — M25512 Pain in left shoulder: Secondary | ICD-10-CM | POA: Diagnosis not present

## 2016-11-24 DIAGNOSIS — R29898 Other symptoms and signs involving the musculoskeletal system: Secondary | ICD-10-CM | POA: Diagnosis not present

## 2016-11-24 DIAGNOSIS — M25612 Stiffness of left shoulder, not elsewhere classified: Secondary | ICD-10-CM | POA: Diagnosis not present

## 2016-11-24 NOTE — Therapy (Signed)
[place], [place], [address] Phone: [phone]   Fax:  [phone]  Occupational Therapy Treatment  Patient Details  Name: [name] MRN: [id number] Date of Birth: 1934/01/01 Referring Provider: Dr. Remo Lipps Case   Encounter Date: 11/24/2016  OT End of Session - 11/24/16 1601    Visit Number  4    Number of Visits  16    Date for OT Re-Evaluation  01/10/17 mini-reassessment 12/10/2016    Authorization Type  [employer] Medicare; $40 copay    Authorization Time Period  Before 10th visit    Authorization - Visit Number  4    Authorization - Number of Visits  10    OT Start Time  1520    OT Stop Time  1600    OT Time Calculation (min)  40 min    Activity Tolerance  Patient tolerated treatment well    Behavior During Therapy  [facility] for tasks assessed/performed       Past Medical History:  Diagnosis Date  . Cervicalgia   . Colon cancer ([place])   . Degeneration of cervical intervertebral disc   . Hypertension   . Pinched nerve in shoulder   . Rheumatoid arthritis(714.0)   . RUQ pain   . Vulvar dystrophy   . Yeast infection     Past Surgical History:  Procedure Laterality Date  . ABDOMINAL HYSTERECTOMY  1960'S  . APPENDECTOMY  with hysterectomy  . BACK SURGERY  12/08/10   plate/screws C3 C4  . CHOLECYSTECTOMY  2002  . COLONOSCOPY  04/02/2009  . COLONOSCOPY  06/04/05  . HEMICOLECTOMY  10/05    There were no vitals filed for this visit.  Subjective Assessment - 11/24/16 1522    Currently in Pain?  No/denies         [facility] OT Assessment - 11/24/16 1522      Assessment   Diagnosis  s/p Left RCR      Precautions   Precautions  Shoulder    Type of Shoulder Precautions  See protocol: weeks 0-6 (10/16-11/27) P/ROM, pendulums, isometrics. Weeks 6-12 (11/27-1/1): AA/ROM and progress as tolerated. No strengthening until 12 weeks    Shoulder Interventions  Shoulder sling/immobilizer;At all times                OT Treatments/Exercises (OP) - 11/24/16 1534      Exercises   Exercises  Shoulder      Shoulder Exercises: Supine   Protraction  PROM;10 reps    Horizontal ABduction  PROM;10 reps    External Rotation  PROM;10 reps    Internal Rotation  PROM;10 reps    Flexion  PROM;10 reps    ABduction  PROM;10 reps      Shoulder Exercises: Seated   Extension  AROM;10 reps    Row  AROM;10 reps      Shoulder Exercises: Therapy Ball   Flexion  10 reps    ABduction  10 reps      Shoulder Exercises: ROM/Strengthening   Thumb Tacks  1'    Anterior Glide  5x5"    Caudal Glide  5x5"    Prot/Ret//Elev/Dep  poor retraction form    Other ROM/Strengthening Exercises  Stopped pro/ret/elev/dep and just focused on restraction of scapula for 1'      Shoulder Exercises: Isometric Strengthening   Flexion  Supine;3X5"    Extension  Supine;3X5"    External Rotation  Supine;3X5"    Internal Rotation  Supine;3X5"  ABduction  Supine;3X5"    ADduction  Supine;3X5"      Manual Therapy   Manual Therapy  Myofascial release    Manual therapy comments  Completed separately from therapeutic exercises    Myofascial Release  Myofascial release to left upper arm, trapezius, and scapularis regions to decrease pain and fascial restrictions and increase joint range of motion.                OT Short Term Goals - 11/17/16 1519      OT SHORT TERM GOAL #1   Title  Pt will be provided with and educated on HEP to improve mobility required to use LUE as dominant during daily tasks.     Time  4    Period  Weeks    Status  On-going      OT SHORT TERM GOAL #2   Title  Pt will decrease pain to 5/10 in LUE to improve ability to sleep at night.     Time  4    Period  Weeks    Status  On-going      OT SHORT TERM GOAL #3   Title  Pt will improve LUE P/ROM to [facility] to improve ability to donn shirts with compensatory strategies.     Time  4    Period  Weeks    Status  On-going        OT  Long Term Goals - 11/17/16 1519      OT LONG TERM GOAL #1   Title  Pt will return to highest level of functioning and independence using LUE as dominant during daily tasks.     Time  8    Period  Weeks    Status  On-going      OT LONG TERM GOAL #2   Title  Pt will decrease LUE pain to 3/10 or less to improve ability to perform grooming tasks using LUE as dominant.     Time  8    Period  Weeks    Status  On-going      OT LONG TERM GOAL #3   Title  Pt will decrease LUE fascial restrictions from mod to minimal amounts or less to improve mobility required for functional reaching tasks.     Time  8    Period  Weeks    Status  On-going      OT LONG TERM GOAL #4   Title  Pt will improve LUE A/ROM to [facility] to improve ability to complete bathing tasks using LUE as dominant.     Time  8    Period  Weeks    Status  On-going      OT LONG TERM GOAL #5   Title  Pt will improve LUE strength to 4/5 to improve ability to perform meal preparation tasks.     Time  8    Period  Weeks    Status  On-going            Plan - 11/24/16 1601    Clinical Impression Statement  A: Pt was able to achieve greater P/ROM this session versus previous session. Continues to need min VC for form and technique.     Plan  P: Focus on decreased trapezius activation and focus on scapular mobility during retraction.       Patient will benefit from skilled therapeutic intervention in order to improve the following deficits and impairments:  Decreased activity tolerance, Decreased strength, Impaired flexibility,  Decreased range of motion, Pain, Impaired UE functional use, Increased fascial restricitons  Visit Diagnosis: Acute pain of left shoulder  Stiffness of left shoulder, not elsewhere classified  Other symptoms and signs involving the musculoskeletal system    Problem List Patient Active Problem List   Diagnosis Date Noted  . Vulvar dystrophy 05/23/2014  . Atrial fibrillation ([place]) 08/10/2013  .  Essential hypertension 07/04/2013  . Tachycardia 01/31/2013  . Dystrophy, vulva 05/04/2012  . Abdominal pain, epigastric 03/28/2012  . Dyspepsia 04/14/2011  . History of colon cancer 03/17/2011  . Diarrhea 03/17/2011  . HYPERLIPIDEMIA 11/30/2006  . BRONCHIECTASIS 11/30/2006  . RHEUMATOID ARTHRITIS 11/30/2006   Ailene Ravel, OTR/L,CBIS  [phone]  11/24/2016, 4:03 PM  [place] [address] [place], [place], [address] Phone: [phone]   Fax:  [phone]  Name: JENNAVECIA SCHWIER MRN: [id number] Date of Birth: [birth year]

## 2016-11-27 ENCOUNTER — Other Ambulatory Visit: Payer: Self-pay

## 2016-11-27 ENCOUNTER — Encounter (HOSPITAL_COMMUNITY): Payer: Self-pay | Admitting: Occupational Therapy

## 2016-11-27 ENCOUNTER — Ambulatory Visit (HOSPITAL_COMMUNITY): Payer: Medicare Other | Admitting: Occupational Therapy

## 2016-11-27 DIAGNOSIS — M25612 Stiffness of left shoulder, not elsewhere classified: Secondary | ICD-10-CM

## 2016-11-27 DIAGNOSIS — R29898 Other symptoms and signs involving the musculoskeletal system: Secondary | ICD-10-CM | POA: Diagnosis not present

## 2016-11-27 DIAGNOSIS — M25512 Pain in left shoulder: Secondary | ICD-10-CM

## 2016-11-27 NOTE — Therapy (Signed)
[place], [place], [address] Phone: [phone]   Fax:  [phone]  Occupational Therapy Treatment  Patient Details  Name: [name] MRN: [id number] Date of Birth: [birth year] Referring Provider: Dr. Remo Lipps Case   Encounter Date: 11/27/2016  OT End of Session - 11/27/16 1543    Visit Number  5    Number of Visits  16    Date for OT Re-Evaluation  01/10/17 mini-reassessment 12/10/2016    Authorization Type  [employer] Medicare; $40 copay    Authorization Time Period  Before 10th visit    Authorization - Visit Number  5    Authorization - Number of Visits  10    OT Start Time  0867    OT Stop Time  1515    OT Time Calculation (min)  41 min    Activity Tolerance  Patient tolerated treatment well    Behavior During Therapy  [facility] for tasks assessed/performed       Past Medical History:  Diagnosis Date  . Cervicalgia   . Colon cancer ([place])   . Degeneration of cervical intervertebral disc   . Hypertension   . Pinched nerve in shoulder   . Rheumatoid arthritis(714.0)   . RUQ pain   . Vulvar dystrophy   . Yeast infection     Past Surgical History:  Procedure Laterality Date  . ABDOMINAL HYSTERECTOMY  1960'S  . APPENDECTOMY  with hysterectomy  . BACK SURGERY  12/08/10   plate/screws C3 C4  . CHOLECYSTECTOMY  2002  . COLONOSCOPY  04/02/2009  . COLONOSCOPY  06/04/05  . COLONOSCOPY N/A 07/26/2015   Performed by Rogene Houston, MD at [place]  . COLONOSCOPY WITH ESOPHAGOGASTRODUODENOSCOPY (EGD) N/A 04/08/2012   Performed by Rogene Houston, MD at [place]  . ESOPHAGOGASTRODUODENOSCOPY (EGD) N/A 07/26/2015   Performed by Rogene Houston, MD at [place]  . HEMICOLECTOMY  10/05    There were no vitals filed for this visit.  Subjective Assessment - 11/27/16 1437    Subjective   S: I try to do those exercises some.     Currently in Pain?  No/denies         [facility] OT Assessment -  11/27/16 1437      Assessment   Diagnosis  s/p Left RCR      Precautions   Precautions  Shoulder    Type of Shoulder Precautions  See protocol: weeks 0-6 (10/16-11/27) P/ROM, pendulums, isometrics. Weeks 6-12 (11/27-1/1): AA/ROM and progress as tolerated. No strengthening until 12 weeks    Shoulder Interventions  Shoulder sling/immobilizer;At all times               OT Treatments/Exercises (OP) - 11/27/16 1437      Exercises   Exercises  Shoulder      Shoulder Exercises: Supine   Protraction  PROM;10 reps    Horizontal ABduction  PROM;10 reps    External Rotation  PROM;10 reps    Internal Rotation  PROM;10 reps    Flexion  PROM;10 reps    ABduction  PROM;10 reps      Shoulder Exercises: Seated   Elevation  AROM;10 reps    Extension  AROM;10 reps    Row  AROM;10 reps      Shoulder Exercises: ROM/Strengthening   Thumb Tacks  1'    Anterior Glide  5x5"    Other ROM/Strengthening Exercises  retraction of scapula 1'- completed  at mirror so pt could sit      Shoulder Exercises: Isometric Strengthening   Flexion  Supine;3X5"    Extension  Supine;3X5"    External Rotation  Supine;3X5"    Internal Rotation  Supine;3X5"    ABduction  Supine;3X5"    ADduction  Supine;3X5"      Manual Therapy   Manual Therapy  Myofascial release    Manual therapy comments  Completed separately from therapeutic exercises    Myofascial Release  Myofascial release to left upper arm, trapezius, and scapularis regions to decrease pain and fascial restrictions and increase joint range of motion.                OT Short Term Goals - 11/17/16 1519      OT SHORT TERM GOAL #1   Title  Pt will be provided with and educated on HEP to improve mobility required to use LUE as dominant during daily tasks.     Time  4    Period  Weeks    Status  On-going      OT SHORT TERM GOAL #2   Title  Pt will decrease pain to 5/10 in LUE to improve ability to sleep at night.     Time  4    Period   Weeks    Status  On-going      OT SHORT TERM GOAL #3   Title  Pt will improve LUE P/ROM to [facility] to improve ability to donn shirts with compensatory strategies.     Time  4    Period  Weeks    Status  On-going        OT Long Term Goals - 11/17/16 1519      OT LONG TERM GOAL #1   Title  Pt will return to highest level of functioning and independence using LUE as dominant during daily tasks.     Time  8    Period  Weeks    Status  On-going      OT LONG TERM GOAL #2   Title  Pt will decrease LUE pain to 3/10 or less to improve ability to perform grooming tasks using LUE as dominant.     Time  8    Period  Weeks    Status  On-going      OT LONG TERM GOAL #3   Title  Pt will decrease LUE fascial restrictions from mod to minimal amounts or less to improve mobility required for functional reaching tasks.     Time  8    Period  Weeks    Status  On-going      OT LONG TERM GOAL #4   Title  Pt will improve LUE A/ROM to [facility] to improve ability to complete bathing tasks using LUE as dominant.     Time  8    Period  Weeks    Status  On-going      OT LONG TERM GOAL #5   Title  Pt will improve LUE strength to 4/5 to improve ability to perform meal preparation tasks.     Time  8    Period  Weeks    Status  On-going            Plan - 11/27/16 1543    Clinical Impression Statement  A: Continued with P/ROM and isometrics. Thumb tacks completed at mirror due to pt poor balance in standing, caudal glides not completed this session. Verbal cuing for form,  especially with scapular A/ROM exercises.     Plan  P: continue to focus on decreased trapezius activation and focus on scapular mobility    OT Home Exercise Plan  10/31: table slides; 11/6: scapular A/ROM       Patient will benefit from skilled therapeutic intervention in order to improve the following deficits and impairments:  Decreased activity tolerance, Decreased strength, Impaired flexibility, Decreased range of motion, Pain,  Impaired UE functional use, Increased fascial restricitons  Visit Diagnosis: Acute pain of left shoulder  Stiffness of left shoulder, not elsewhere classified  Other symptoms and signs involving the musculoskeletal system    Problem List Patient Active Problem List   Diagnosis Date Noted  . Vulvar dystrophy 05/23/2014  . Atrial fibrillation ([place]) 08/10/2013  . Essential hypertension 07/04/2013  . Tachycardia 01/31/2013  . Dystrophy, vulva 05/04/2012  . Abdominal pain, epigastric 03/28/2012  . Dyspepsia 04/14/2011  . History of colon cancer 03/17/2011  . Diarrhea 03/17/2011  . HYPERLIPIDEMIA 11/30/2006  . BRONCHIECTASIS 11/30/2006  . RHEUMATOID ARTHRITIS 11/30/2006    Guadelupe Sabin, OTR/L  [phone] 11/27/2016, 3:45 PM  [place] [address] [place], [place], [address] Phone: [phone]   Fax:  [phone]  Name: [name] MRN: [id number] Date of Birth: 1933-02-10

## 2016-11-30 ENCOUNTER — Ambulatory Visit (HOSPITAL_COMMUNITY): Payer: Medicare Other | Admitting: Specialist

## 2016-11-30 ENCOUNTER — Encounter (HOSPITAL_COMMUNITY): Payer: Self-pay | Admitting: Specialist

## 2016-11-30 DIAGNOSIS — M25612 Stiffness of left shoulder, not elsewhere classified: Secondary | ICD-10-CM | POA: Diagnosis not present

## 2016-11-30 DIAGNOSIS — R29898 Other symptoms and signs involving the musculoskeletal system: Secondary | ICD-10-CM

## 2016-11-30 DIAGNOSIS — M25512 Pain in left shoulder: Secondary | ICD-10-CM | POA: Diagnosis not present

## 2016-11-30 NOTE — Therapy (Signed)
[place], [place], [address] Phone: [phone]   Fax:  [phone]  Occupational Therapy Treatment  Patient Details  Name: [name] MRN: [id number] Date of Birth: 11/26/1933 Referring Provider: Dr. Remo Lipps Case   Encounter Date: 11/30/2016  OT End of Session - 11/30/16 1523    Visit Number  6    Number of Visits  16    Date for OT Re-Evaluation  01/10/17 mini reassessment on 11/29    Authorization Type  [employer] Medicare; $40 copay    Authorization Time Period  Before 10th visit    Authorization - Visit Number  6    Authorization - Number of Visits  10    OT Start Time  1432    OT Stop Time  1515    OT Time Calculation (min)  43 min       Past Medical History:  Diagnosis Date  . Cervicalgia   . Colon cancer ([place])   . Degeneration of cervical intervertebral disc   . Hypertension   . Pinched nerve in shoulder   . Rheumatoid arthritis(714.0)   . RUQ pain   . Vulvar dystrophy   . Yeast infection     Past Surgical History:  Procedure Laterality Date  . ABDOMINAL HYSTERECTOMY  1960'S  . APPENDECTOMY  with hysterectomy  . BACK SURGERY  12/08/10   plate/screws C3 C4  . CHOLECYSTECTOMY  2002  . COLONOSCOPY  04/02/2009  . COLONOSCOPY  06/04/05  . COLONOSCOPY N/A 07/26/2015   Performed by Rogene Houston, MD at [place]  . COLONOSCOPY WITH ESOPHAGOGASTRODUODENOSCOPY (EGD) N/A 04/08/2012   Performed by Rogene Houston, MD at [place]  . ESOPHAGOGASTRODUODENOSCOPY (EGD) N/A 07/26/2015   Performed by Rogene Houston, MD at [place]  . HEMICOLECTOMY  10/05    There were no vitals filed for this visit.  Subjective Assessment - 11/30/16 1438    Subjective   S;  I forgot to take my pain medicine before I came.  its a bit sore.     Currently in Pain?  Yes    Pain Score  5     Pain Location  Shoulder    Pain Orientation  Left    Pain Descriptors / Indicators  Aching    Pain Type   Acute pain    Pain Onset  1 to 4 weeks ago    Aggravating Factors   use    Pain Relieving Factors  medicine    Effect of Pain on Daily Activities  severe         [facility] OT Assessment - 11/30/16 0001      Assessment   Diagnosis  s/p Left RCR      Precautions   Precautions  Shoulder    Type of Shoulder Precautions  See protocol: weeks 0-6 (10/16-11/27) P/ROM, pendulums, isometrics. Weeks 6-12 (11/27-1/1): AA/ROM and progress as tolerated. No strengthening until 12 weeks    Shoulder Interventions  Shoulder sling/immobilizer;At all times               OT Treatments/Exercises (OP) - 11/30/16 0001      Exercises   Exercises  Shoulder      Shoulder Exercises: Supine   Protraction  PROM;10 reps    Horizontal ABduction  PROM;10 reps    External Rotation  PROM;10 reps    Internal Rotation  PROM;10 reps    Flexion  PROM;10 reps  ABduction  PROM;10 reps    Other Supine Exercises  bridges 15 times      Shoulder Exercises: Seated   Elevation  AROM;10 reps    Extension  AROM;10 reps    Row  AROM;10 reps    Other Seated Exercises  elbow flexion extension, supination pronation, wrist flexion extension 10 times each      Shoulder Exercises: Therapy Ball   Flexion  15 reps    ABduction  15 reps      Shoulder Exercises: Isometric Strengthening   Flexion  Supine;5X5"    Extension  Supine;5X5"    External Rotation  Supine;5X5"    Internal Rotation  Supine;5X5"    ABduction  Supine;5X5"    ADduction  Supine;5X5"      Manual Therapy   Manual Therapy  Myofascial release    Manual therapy comments  Completed separately from therapeutic exercises    Myofascial Release  Myofascial release to left upper arm, trapezius, and scapularis regions to decrease pain and fascial restrictions and increase joint range of motion.                OT Short Term Goals - 11/17/16 1519      OT SHORT TERM GOAL #1   Title  Pt will be provided with and educated on HEP to improve  mobility required to use LUE as dominant during daily tasks.     Time  4    Period  Weeks    Status  On-going      OT SHORT TERM GOAL #2   Title  Pt will decrease pain to 5/10 in LUE to improve ability to sleep at night.     Time  4    Period  Weeks    Status  On-going      OT SHORT TERM GOAL #3   Title  Pt will improve LUE P/ROM to [facility] to improve ability to donn shirts with compensatory strategies.     Time  4    Period  Weeks    Status  On-going        OT Long Term Goals - 11/17/16 1519      OT LONG TERM GOAL #1   Title  Pt will return to highest level of functioning and independence using LUE as dominant during daily tasks.     Time  8    Period  Weeks    Status  On-going      OT LONG TERM GOAL #2   Title  Pt will decrease LUE pain to 3/10 or less to improve ability to perform grooming tasks using LUE as dominant.     Time  8    Period  Weeks    Status  On-going      OT LONG TERM GOAL #3   Title  Pt will decrease LUE fascial restrictions from mod to minimal amounts or less to improve mobility required for functional reaching tasks.     Time  8    Period  Weeks    Status  On-going      OT LONG TERM GOAL #4   Title  Pt will improve LUE A/ROM to [facility] to improve ability to complete bathing tasks using LUE as dominant.     Time  8    Period  Weeks    Status  On-going      OT LONG TERM GOAL #5   Title  Pt will improve LUE strength to 4/5 to  improve ability to perform meal preparation tasks.     Time  8    Period  Weeks    Status  On-going            Plan - 11/30/16 1523    Clinical Impression Statement  A:  Patient very sensitive to [place], therapist providing minimal pressure with manual therapy.  abduction p/rom is painful for patient above 70 degrees.  able to increase isometric contraction to 5" for 5 repetitions this date.     Plan  P:  Increase available range into abduction, with less pain.  resume thumb tacks and add seated caudal glide and anterior  glide.        Patient will benefit from skilled therapeutic intervention in order to improve the following deficits and impairments:  Decreased activity tolerance, Decreased strength, Impaired flexibility, Decreased range of motion, Pain, Impaired UE functional use, Increased fascial restricitons  Visit Diagnosis: Acute pain of left shoulder  Stiffness of left shoulder, not elsewhere classified  Other symptoms and signs involving the musculoskeletal system    Problem List Patient Active Problem List   Diagnosis Date Noted  . Vulvar dystrophy 05/23/2014  . Atrial fibrillation ([place]) 08/10/2013  . Essential hypertension 07/04/2013  . Tachycardia 01/31/2013  . Dystrophy, vulva 05/04/2012  . Abdominal pain, epigastric 03/28/2012  . Dyspepsia 04/14/2011  . History of colon cancer 03/17/2011  . Diarrhea 03/17/2011  . HYPERLIPIDEMIA 11/30/2006  . BRONCHIECTASIS 11/30/2006  . RHEUMATOID ARTHRITIS 11/30/2006    Vangie Bicker, [place], OTR/L [phone]  11/30/2016, 3:26 PM  [place] [address] [place], [place], [address] Phone: [phone]   Fax:  [phone]  Name: BRITTANI PURDUM MRN: [id number] Date of Birth: July 13, 1933

## 2016-12-02 ENCOUNTER — Other Ambulatory Visit: Payer: Self-pay

## 2016-12-02 ENCOUNTER — Encounter (HOSPITAL_COMMUNITY): Payer: Self-pay | Admitting: Occupational Therapy

## 2016-12-02 ENCOUNTER — Ambulatory Visit (HOSPITAL_COMMUNITY): Payer: Medicare Other | Admitting: Occupational Therapy

## 2016-12-02 DIAGNOSIS — M25512 Pain in left shoulder: Secondary | ICD-10-CM

## 2016-12-02 DIAGNOSIS — M25612 Stiffness of left shoulder, not elsewhere classified: Secondary | ICD-10-CM | POA: Diagnosis not present

## 2016-12-02 DIAGNOSIS — R29898 Other symptoms and signs involving the musculoskeletal system: Secondary | ICD-10-CM | POA: Diagnosis not present

## 2016-12-02 NOTE — Therapy (Addendum)
[place], [place], [address] Phone: [phone]   Fax:  [phone]  Occupational Therapy Treatment & Discharge  Patient Details  Name: [name] MRN: [id number] Date of Birth: September 18, 1933 Referring Provider: Dr. Remo Lipps Case   Encounter Date: 12/02/2016  OT End of Session - 12/02/16 1513    Visit Number  7    Number of Visits  16    Date for OT Re-Evaluation  01/10/17 mini reassessment on 11/29    Authorization Type  [employer] Medicare; $40 copay    Authorization Time Period  Before 10th visit    Authorization - Visit Number  7    Authorization - Number of Visits  10    OT Start Time  1427    OT Stop Time  1511    OT Time Calculation (min)  44 min       Past Medical History:  Diagnosis Date  . Cervicalgia   . Colon cancer ([place])   . Degeneration of cervical intervertebral disc   . Hypertension   . Pinched nerve in shoulder   . Rheumatoid arthritis(714.0)   . RUQ pain   . Vulvar dystrophy   . Yeast infection     Past Surgical History:  Procedure Laterality Date  . ABDOMINAL HYSTERECTOMY  1960'S  . APPENDECTOMY  with hysterectomy  . BACK SURGERY  12/08/10   plate/screws C3 C4  . CHOLECYSTECTOMY  2002  . COLONOSCOPY  04/02/2009  . COLONOSCOPY  06/04/05  . COLONOSCOPY N/A 07/26/2015   Procedure: COLONOSCOPY;  Surgeon: Rogene Houston, MD;  [facility];  Service: Endoscopy;  Laterality: N/A;  1240  . COLONOSCOPY WITH ESOPHAGOGASTRODUODENOSCOPY (EGD) N/A 04/08/2012   Procedure: COLONOSCOPY WITH ESOPHAGOGASTRODUODENOSCOPY (EGD);  Surgeon: Rogene Houston, MD;  [facility];  Service: Endoscopy;  Laterality: N/A;  130-moved to 245 Ann notified pt  . ESOPHAGOGASTRODUODENOSCOPY N/A 07/26/2015   Procedure: ESOPHAGOGASTRODUODENOSCOPY (EGD);  Surgeon: Rogene Houston, MD;  [facility];  Service: Endoscopy;  Laterality: N/A;  . HEMICOLECTOMY  10/05    There were no vitals  filed for this visit.  Subjective Assessment - 12/02/16 1427    Subjective   S: I've done my exercises twice today.     Currently in Pain?  Yes    Pain Score  4     Pain Location  Shoulder    Pain Orientation  Left    Pain Descriptors / Indicators  Aching;Sore    Pain Type  Acute pain    Pain Radiating Towards  none    Pain Onset  1 to 4 weeks ago    Pain Frequency  Intermittent    Aggravating Factors   use    Pain Relieving Factors  medicine    Effect of Pain on Daily Activities  severe effect on ADL completion    Multiple Pain Sites  No         [facility] OT Assessment - 12/02/16 1427      Assessment   Diagnosis  s/p Left RCR      Precautions   Precautions  Shoulder    Type of Shoulder Precautions  See protocol: weeks 0-6 (10/16-11/27) P/ROM, pendulums, isometrics. Weeks 6-12 (11/27-1/1): AA/ROM and progress as tolerated. No strengthening until 12 weeks    Shoulder Interventions  Shoulder sling/immobilizer;At all times               OT Treatments/Exercises (OP) - 12/02/16  1428      Exercises   Exercises  Shoulder      Shoulder Exercises: Supine   Protraction  PROM;10 reps    Horizontal ABduction  PROM;10 reps    External Rotation  PROM;10 reps    Internal Rotation  PROM;10 reps    Flexion  PROM;10 reps    ABduction  PROM;10 reps    Other Supine Exercises  bridges 15 times      Shoulder Exercises: Seated   Elevation  AROM;15 reps    Extension  AROM;15 reps    Row  AROM;15 reps      Shoulder Exercises: Therapy Ball   Flexion  15 reps    ABduction  15 reps      Shoulder Exercises: ROM/Strengthening   Thumb Tacks  1' low level    Anterior Glide  5x10"    Caudal Glide  5x10"      Shoulder Exercises: Isometric Strengthening   Flexion  Supine;5X5"    Extension  Supine;5X5"    External Rotation  Supine;5X5"    Internal Rotation  Supine;5X5"    ABduction  Supine;5X5"    ADduction  Supine;5X5"      Manual Therapy   Manual Therapy  Myofascial release     Manual therapy comments  Completed separately from therapeutic exercises    Myofascial Release  Myofascial release to left upper arm, trapezius, and scapularis regions to decrease pain and fascial restrictions and increase joint range of motion.                OT Short Term Goals - 11/17/16 1519      OT SHORT TERM GOAL #1   Title  Pt will be provided with and educated on HEP to improve mobility required to use LUE as dominant during daily tasks.     Time  4    Period  Weeks    Status  On-going      OT SHORT TERM GOAL #2   Title  Pt will decrease pain to 5/10 in LUE to improve ability to sleep at night.     Time  4    Period  Weeks    Status  On-going      OT SHORT TERM GOAL #3   Title  Pt will improve LUE P/ROM to [facility] to improve ability to donn shirts with compensatory strategies.     Time  4    Period  Weeks    Status  On-going        OT Long Term Goals - 11/17/16 1519      OT LONG TERM GOAL #1   Title  Pt will return to highest level of functioning and independence using LUE as dominant during daily tasks.     Time  8    Period  Weeks    Status  On-going      OT LONG TERM GOAL #2   Title  Pt will decrease LUE pain to 3/10 or less to improve ability to perform grooming tasks using LUE as dominant.     Time  8    Period  Weeks    Status  On-going      OT LONG TERM GOAL #3   Title  Pt will decrease LUE fascial restrictions from mod to minimal amounts or less to improve mobility required for functional reaching tasks.     Time  8    Period  Weeks    Status  On-going  OT LONG TERM GOAL #4   Title  Pt will improve LUE A/ROM to [facility] to improve ability to complete bathing tasks using LUE as dominant.     Time  8    Period  Weeks    Status  On-going      OT LONG TERM GOAL #5   Title  Pt will improve LUE strength to 4/5 to improve ability to perform meal preparation tasks.     Time  8    Period  Weeks    Status  On-going            Plan -  12/02/16 1506    Clinical Impression Statement  A: Pt able to tolerate P/ROM WNL this date, much improved in tolerance to manual therapy, slight discomfort with horizontabl abduction. Increased scapular A/ROM to 15 reps and increased glides to 10" holds. Occasional cuing for form.  Pt reports HEP is going well and she is completing twice per day.     Plan  P: Mini-reassessment. Progress with protocol adding AA/ROM as able to tolerate    OT Home Exercise Plan  10/31: table slides; 11/6: scapular A/ROM    Consulted and Agree with Plan of Care  Patient       Patient will benefit from skilled therapeutic intervention in order to improve the following deficits and impairments:  Decreased activity tolerance, Decreased strength, Impaired flexibility, Decreased range of motion, Pain, Impaired UE functional use, Increased fascial restricitons  Visit Diagnosis: Acute pain of left shoulder  Stiffness of left shoulder, not elsewhere classified  Other symptoms and signs involving the musculoskeletal system    Problem List Patient Active Problem List   Diagnosis Date Noted  . Vulvar dystrophy 05/23/2014  . Atrial fibrillation ([place]) 08/10/2013  . Essential hypertension 07/04/2013  . Tachycardia 01/31/2013  . Dystrophy, vulva 05/04/2012  . Abdominal pain, epigastric 03/28/2012  . Dyspepsia 04/14/2011  . History of colon cancer 03/17/2011  . Diarrhea 03/17/2011  . HYPERLIPIDEMIA 11/30/2006  . BRONCHIECTASIS 11/30/2006  . RHEUMATOID ARTHRITIS 11/30/2006   Guadelupe Sabin, OTR/L  [phone] 12/02/2016, 3:14 PM  [place], [place], [address] Phone: [phone]   Fax:  [phone]  Name: CHARO PHILIPP MRN: [id number] Date of Birth: 12-14-1933    OCCUPATIONAL THERAPY DISCHARGE SUMMARY  Visits from Start of Care: 7  Current functional level related to goals / functional outcomes: Unknown. Pt requested to be discharged  after last MD appt.    Remaining deficits: Unknown.    Education / Equipment: HEP throughout therapy duration Plan: Patient agrees to discharge.  Patient goals were partially met. Patient is being discharged due to being pleased with the current functional level.  ?????

## 2016-12-08 ENCOUNTER — Encounter (HOSPITAL_COMMUNITY): Payer: Medicare Other | Admitting: Occupational Therapy

## 2016-12-08 ENCOUNTER — Telehealth (HOSPITAL_COMMUNITY): Payer: Self-pay | Admitting: Occupational Therapy

## 2016-12-08 NOTE — Telephone Encounter (Signed)
Pt saw Md today and states she wants to cx all future apptments. MD will send her back in January if needed

## 2016-12-10 ENCOUNTER — Encounter (HOSPITAL_COMMUNITY): Payer: Medicare Other | Admitting: Occupational Therapy

## 2016-12-11 ENCOUNTER — Encounter (HOSPITAL_COMMUNITY): Payer: Self-pay | Admitting: Specialist

## 2016-12-15 ENCOUNTER — Encounter (HOSPITAL_COMMUNITY): Payer: Medicare Other

## 2016-12-15 DIAGNOSIS — E559 Vitamin D deficiency, unspecified: Secondary | ICD-10-CM | POA: Diagnosis not present

## 2016-12-15 DIAGNOSIS — M150 Primary generalized (osteo)arthritis: Secondary | ICD-10-CM | POA: Diagnosis not present

## 2016-12-15 DIAGNOSIS — M5136 Other intervertebral disc degeneration, lumbar region: Secondary | ICD-10-CM | POA: Diagnosis not present

## 2016-12-15 DIAGNOSIS — M0589 Other rheumatoid arthritis with rheumatoid factor of multiple sites: Secondary | ICD-10-CM | POA: Diagnosis not present

## 2016-12-15 DIAGNOSIS — Z79899 Other long term (current) drug therapy: Secondary | ICD-10-CM | POA: Diagnosis not present

## 2016-12-17 ENCOUNTER — Encounter (HOSPITAL_COMMUNITY): Payer: Medicare Other

## 2016-12-22 ENCOUNTER — Encounter (HOSPITAL_COMMUNITY): Payer: Medicare Other | Admitting: Occupational Therapy

## 2016-12-24 ENCOUNTER — Encounter (HOSPITAL_COMMUNITY): Payer: Medicare Other | Admitting: Occupational Therapy

## 2016-12-29 ENCOUNTER — Encounter (HOSPITAL_COMMUNITY): Payer: Medicare Other | Admitting: Occupational Therapy

## 2016-12-31 ENCOUNTER — Encounter (HOSPITAL_COMMUNITY): Payer: Medicare Other | Admitting: Occupational Therapy

## 2017-01-02 ENCOUNTER — Emergency Department (HOSPITAL_COMMUNITY): Payer: Medicare Other

## 2017-01-02 ENCOUNTER — Emergency Department (HOSPITAL_COMMUNITY)
Admission: EM | Admit: 2017-01-02 | Discharge: 2017-01-02 | Disposition: A | Discharge status: Home / Self Care | Payer: Medicare Other | Attending: Emergency Medicine | Admitting: Emergency Medicine

## 2017-01-02 ENCOUNTER — Encounter (HOSPITAL_COMMUNITY): Payer: Self-pay | Admitting: Adult Health

## 2017-01-02 ENCOUNTER — Other Ambulatory Visit: Payer: Self-pay

## 2017-01-02 DIAGNOSIS — I10 Essential (primary) hypertension: Secondary | ICD-10-CM | POA: Insufficient documentation

## 2017-01-02 DIAGNOSIS — R200 Anesthesia of skin: Secondary | ICD-10-CM | POA: Diagnosis not present

## 2017-01-02 DIAGNOSIS — S299XXA Unspecified injury of thorax, initial encounter: Secondary | ICD-10-CM | POA: Diagnosis not present

## 2017-01-02 DIAGNOSIS — S0990XA Unspecified injury of head, initial encounter: Secondary | ICD-10-CM | POA: Diagnosis not present

## 2017-01-02 DIAGNOSIS — R0789 Other chest pain: Secondary | ICD-10-CM | POA: Diagnosis not present

## 2017-01-02 DIAGNOSIS — Z79899 Other long term (current) drug therapy: Secondary | ICD-10-CM | POA: Diagnosis not present

## 2017-01-02 DIAGNOSIS — M25512 Pain in left shoulder: Secondary | ICD-10-CM | POA: Diagnosis not present

## 2017-01-02 DIAGNOSIS — Z7901 Long term (current) use of anticoagulants: Secondary | ICD-10-CM | POA: Insufficient documentation

## 2017-01-02 DIAGNOSIS — Z7982 Long term (current) use of aspirin: Secondary | ICD-10-CM | POA: Insufficient documentation

## 2017-01-02 DIAGNOSIS — R531 Weakness: Secondary | ICD-10-CM | POA: Insufficient documentation

## 2017-01-02 DIAGNOSIS — R202 Paresthesia of skin: Secondary | ICD-10-CM | POA: Diagnosis present

## 2017-01-02 DIAGNOSIS — T148XXA Other injury of unspecified body region, initial encounter: Secondary | ICD-10-CM | POA: Diagnosis not present

## 2017-01-02 LAB — CBC WITH DIFFERENTIAL/PLATELET
BASOS PCT: 1 %
Basophils Absolute: 0.1 10*3/uL (ref 0.0–0.1)
EOS ABS: 0.4 10*3/uL (ref 0.0–0.7)
Eosinophils Relative: 5 %
HEMATOCRIT: 40.2 % (ref 36.0–46.0)
HEMOGLOBIN: 12.8 g/dL (ref 12.0–15.0)
Lymphocytes Relative: 20 %
Lymphs Abs: 1.6 10*3/uL (ref 0.7–4.0)
MCH: 28.5 pg (ref 26.0–34.0)
MCHC: 31.8 g/dL (ref 30.0–36.0)
MCV: 89.5 fL (ref 78.0–100.0)
MONOS PCT: 11 %
Monocytes Absolute: 0.9 10*3/uL (ref 0.1–1.0)
NEUTROS ABS: 5.3 10*3/uL (ref 1.7–7.7)
NEUTROS PCT: 63 %
Platelets: 289 10*3/uL (ref 150–400)
RBC: 4.49 MIL/uL (ref 3.87–5.11)
RDW: 14.5 % (ref 11.5–15.5)
WBC: 8.3 10*3/uL (ref 4.0–10.5)

## 2017-01-02 LAB — URINALYSIS, ROUTINE W REFLEX MICROSCOPIC
Bilirubin Urine: NEGATIVE
Glucose, UA: NEGATIVE mg/dL
Hgb urine dipstick: NEGATIVE
Ketones, ur: NEGATIVE mg/dL
LEUKOCYTES UA: NEGATIVE
NITRITE: NEGATIVE
PH: 5 (ref 5.0–8.0)
Protein, ur: NEGATIVE mg/dL
SPECIFIC GRAVITY, URINE: 1.005 (ref 1.005–1.030)

## 2017-01-02 LAB — BASIC METABOLIC PANEL
ANION GAP: 13 (ref 5–15)
BUN: 13 mg/dL (ref 6–20)
CHLORIDE: 101 mmol/L (ref 101–111)
CO2: 24 mmol/L (ref 22–32)
Calcium: 8.9 mg/dL (ref 8.9–10.3)
Creatinine, Ser: 0.71 mg/dL (ref 0.44–1.00)
GFR calc non Af Amer: >60 mL/min (ref >60)
Glucose, Bld: 123 mg/dL — ABNORMAL HIGH (ref 65–99)
POTASSIUM: 3.7 mmol/L (ref 3.5–5.1)
SODIUM: 138 mmol/L (ref 135–145)

## 2017-01-02 MED ORDER — SODIUM CHLORIDE 0.9 % IV BOLUS (SEPSIS)
500.0000 mL | Freq: Once | INTRAVENOUS | Status: AC
  Administered 2017-01-02: 500 mL via INTRAVENOUS

## 2017-01-02 NOTE — ED Provider Notes (Signed)
[facility] EMERGENCY DEPARTMENT Provider Note   CSN: [id number] Arrival date & time: 01/02/17  1152     History   Chief Complaint Chief Complaint  Patient presents with  . Stroke Symptoms    HPI [name] is a 83 y.o. female.  Complains of periorbital tingling, numbness in fingertips for 1-2 weeks.  This morning upon awakening patient felt "strange my head".  Patient was unable to further delineate this feeling.  She is normally ambulatory with a walker.  She states her left foot did not work as well today.  She has had previous lumbar surgery.  No fever, sweats, chills, dysuria, chest pain, nausea.  She fell earlier today striking her head and left ribs.  Family reports normal mental status.      Past Medical History:  Diagnosis Date  . Cervicalgia   . Colon cancer ([place])   . Degeneration of cervical intervertebral disc   . Hypertension   . Pinched nerve in shoulder   . Rheumatoid arthritis(714.0)   . RUQ pain   . Vulvar dystrophy   . Yeast infection     Patient Active Problem List   Diagnosis Date Noted  . Vulvar dystrophy 05/23/2014  . Atrial fibrillation ([place]) 08/10/2013  . Essential hypertension 07/04/2013  . Tachycardia 01/31/2013  . Dystrophy, vulva 05/04/2012  . Abdominal pain, epigastric 03/28/2012  . Dyspepsia 04/14/2011  . History of colon cancer 03/17/2011  . Diarrhea 03/17/2011  . HYPERLIPIDEMIA 11/30/2006  . BRONCHIECTASIS 11/30/2006  . RHEUMATOID ARTHRITIS 11/30/2006    Past Surgical History:  Procedure Laterality Date  . ABDOMINAL HYSTERECTOMY  1960'S  . APPENDECTOMY  with hysterectomy  . BACK SURGERY  12/08/10   plate/screws C3 C4  . CHOLECYSTECTOMY  2002  . COLONOSCOPY  04/02/2009  . COLONOSCOPY  06/04/05  . COLONOSCOPY N/A 07/26/2015   Procedure: COLONOSCOPY;  Surgeon: Rogene Houston, MD;  [facility];  Service: Endoscopy;  Laterality: N/A;  1240  . COLONOSCOPY WITH ESOPHAGOGASTRODUODENOSCOPY (EGD) N/A 04/08/2012     Procedure: COLONOSCOPY WITH ESOPHAGOGASTRODUODENOSCOPY (EGD);  Surgeon: Rogene Houston, MD;  [facility];  Service: Endoscopy;  Laterality: N/A;  130-moved to 245 Ann notified pt  . ESOPHAGOGASTRODUODENOSCOPY N/A 07/26/2015   Procedure: ESOPHAGOGASTRODUODENOSCOPY (EGD);  Surgeon: Rogene Houston, MD;  [facility];  Service: Endoscopy;  Laterality: N/A;  . HEMICOLECTOMY  10/05    OB History    Gravida Para Term Preterm AB Living   4 4       4    SAB TAB Ectopic Multiple Live Births           4       Home Medications    Prior to Admission medications   Medication Sig Start Date End Date Taking? Authorizing Provider  amLODipine (NORVASC) 5 MG tablet take 1 tablet by mouth once daily 10/13/16  Yes Herminio Commons, MD  aspirin EC 81 MG tablet Take 81 mg by mouth daily.   Yes [provider]  HYDROcodone-acetaminophen (NORCO/VICODIN) 5-325 MG tablet Take 1 tablet by mouth every 6 (six) hours as needed.   Yes [provider]  leflunomide (ARAVA) 20 MG tablet Take 20 mg by mouth daily.   Yes [provider]  lisinopril (PRINIVIL,ZESTRIL) 20 MG tablet Take 1 tablet (20 mg total) by mouth 2 (two) times daily. 11/13/16  Yes Herminio Commons, MD  metoprolol succinate (TOPROL-XL) 50 MG 24 hr tablet take 1 tablet by mouth twice  a day 03/25/16  Yes Herminio Commons, MD  omeprazole (PRILOSEC) 20 MG capsule take 1 capsule by mouth twice a day 12/16/15  Yes Setzer, Terri L, NP  traMADol (ULTRAM) 50 MG tablet Take 50 mg by mouth every 6 (six) hours as needed.   Yes [provider]  apixaban (ELIQUIS) 5 MG TABS tablet Take 1 tablet (5 mg total) by mouth 2 (two) times daily. Patient taking differently: Take 5 mg by mouth daily.  08/17/16   Herminio Commons, MD  ergocalciferol (VITAMIN D2) 50000 UNITS capsule Take 50,000 Units by mouth once a week. Takes on Thursday    [provider]    Family History Family History   Problem Relation Age of Onset  . Rheum arthritis Mother   . Parkinsonism Father   . Hiatal hernia Sister   . Healthy Brother   . Healthy Daughter   . Healthy Son   . Healthy Son     Social History Social History   Tobacco Use  . Smoking status: Never Smoker  . Smokeless tobacco: Never Used  Substance Use Topics  . Alcohol use: No  . Drug use: No     Allergies   Iodinated diagnostic agents; Nerve block tray; Nitrofuran derivatives; Triamcinolone acetonide; and Vibra-tab [doxycycline]   Review of Systems Review of Systems  All other systems reviewed and are negative.    Physical Exam Updated Vital Signs BP (!) 144/77   Pulse 99   Temp 98.1 F (36.7 C) (Oral)   Resp 17   Ht 5\' 2"  (1.575 m)   Wt 54.4 kg (120 lb)   SpO2 93%   BMI 21.95 kg/m   Physical Exam  Constitutional: She is oriented to person, place, and time.  Alert and oriented x3 without gross neurological deficits.  HENT:  Head: Normocephalic and atraumatic.  Eyes: Conjunctivae are normal.  Neck: Neck supple.  Cardiovascular: Normal rate and regular rhythm.  Pulmonary/Chest: Effort normal and breath sounds normal.  Minimal left thoracic tenderness.  Abdominal: Soft. Bowel sounds are normal.  Musculoskeletal: Normal range of motion.  Neurological: She is alert and oriented to person, place, and time.  Skin: Skin is warm and dry.  Psychiatric: She has a normal mood and affect. Her behavior is normal.  Nursing note and vitals reviewed.    ED Treatments / Results  Labs (all labs ordered are listed, but only abnormal results are displayed) Labs Reviewed  BASIC METABOLIC PANEL - Abnormal; Notable for the following components:      Result Value   Glucose, Bld 123 (*)    All other components within normal limits  URINALYSIS, ROUTINE W REFLEX MICROSCOPIC - Abnormal; Notable for the following components:   Color, Urine STRAW (*)    All other components within normal limits  CBC WITH  DIFFERENTIAL/PLATELET    EKG  EKG Interpretation  Date/Time:  Saturday January 02 2017 11:54:25 EST Ventricular Rate:  99 PR Interval:    QRS Duration: 84 QT Interval:  344 QTC Calculation: 442 R Axis:   -68 Text Interpretation:  Sinus tachycardia Atrial premature complexes Left anterior fascicular block Consider right ventricular hypertrophy Nonspecific T abnrm, anterolateral leads Confirmed by Nat Christen [phone]) on 01/02/2017 3:39:37 PM       Radiology Dg Ribs Unilateral W/chest Left  Result Date: 01/02/2017 CLINICAL DATA:  Left chest pain due to a fall today. EXAM: LEFT RIBS AND CHEST - 3+ VIEW COMPARISON:  CT chest and single view of the chest  04/23/2014. FINDINGS: Asymmetric elevation of the right hemidiaphragm relative to the left is unchanged. Lungs are clear. Heart size is normal. No pneumothorax or pleural effusion. Extensive aortic atherosclerosis noted. No acute fracture. The patient is status post L2 vertebral augmentation and left rotator cuff repair. IMPRESSION: Negative for acute fracture.  No acute disease. Atherosclerosis. Postoperative change left shoulder. Status post L2 vertebral augmentation. Electronically Signed   By: Inge Rise M.D.   On: 01/02/2017 15:20   Ct Head Wo Contrast  Result Date: 01/02/2017 CLINICAL DATA:  Fall this am, hitting forehead on the floor. Unsure of loc. Pt stated she just didn't feel right when she got up this am. Left foot and left leg weakness. Hx of colon ca with hemicolectomy EXAM: CT HEAD WITHOUT CONTRAST TECHNIQUE: Contiguous axial images were obtained from the base of the skull through the vertex without intravenous contrast. COMPARISON:  None. FINDINGS: Brain: No evidence of acute infarction, hemorrhage, hydrocephalus, extra-axial collection or mass lesion/mass effect. There is ventricular sulcal enlargement reflecting age-appropriate volume loss. Patchy white matter hypoattenuation is noted consistent with mild chronic  microvascular ischemic change. Vascular: No hyperdense vessel or unexpected calcification. Skull: Normal. Negative for fracture or focal lesion. Sinuses/Orbits: Globes and orbits are unremarkable. There is a small amount of dependent fluid in the left maxillary sinus. Sinuses otherwise clear. Clear mastoid air cells. Other: None. IMPRESSION: 1. No acute intracranial abnormalities.  No skull fracture. 2. Age-appropriate volume loss. Mild chronic microvascular ischemic change. Electronically Signed   By: Lajean Manes M.D.   On: 01/02/2017 13:46    Procedures Procedures (including critical care time)  Medications Ordered in ED Medications  sodium chloride 0.9 % bolus 500 mL (0 mLs Intravenous Stopped 01/02/17 1349)     Initial Impression / Assessment and Plan / ED Course  I have reviewed the triage vital signs and the nursing notes.  Pertinent labs & imaging results that were available during my care of the patient were reviewed by me and considered in my medical decision making (see chart for details).     Patient is exhibiting no gross neurological deficits.  She has full range of motion of her left leg, ankle, foot.  CT head negative.  Urinalysis negative.  Discussed findings with the patient and her daughter.  She will follow-up with her primary care doctor or neurosurgeon if her left leg weakness continues.  Final Clinical Impressions(s) / ED Diagnoses   Final diagnoses:  Weakness    ED Discharge Orders    None       Nat Christen, MD 01/02/17 1640

## 2017-01-02 NOTE — ED Triage Notes (Addendum)
Presents from home, lives alone with c/o of intermittent numbness and tingling in lips and both hands of fingertips for one-two weeks. THis AM, at 8 am she woke up and didn't feel well, "I just felt strange in my head. I don't know how to explain it. It happens off and on for the past few weeks now" . She was trying to walk and could not get her left leg to lift up so she fell onto the ground and hit her head on the right side and was unable to get up. She is unsure and can not recall if her right leg felt the same, she denies confusion or slurred speech. HEmatoma to right forehead and right knee. PT is currently taking Eliquis. Reports some right sided hip pain and left sided chest pain with inspiration. NO deformity, shortening or rotation of hip noted. BReath sounds equal.  Alert, denies dizziness, no drift or droop, no slurred speech.

## 2017-01-02 NOTE — ED Notes (Signed)
Patient transported to X-ray 

## 2017-01-02 NOTE — ED Notes (Signed)
Pt transported to CT ?

## 2017-01-02 NOTE — Discharge Instructions (Signed)
Tests showed no life-threatening conditions.  Follow-up with your primary care doctor or your neurosurgeon if the foot or leg weakness persists.

## 2017-01-08 DIAGNOSIS — G894 Chronic pain syndrome: Secondary | ICD-10-CM | POA: Diagnosis not present

## 2017-01-08 DIAGNOSIS — M810 Age-related osteoporosis without current pathological fracture: Secondary | ICD-10-CM | POA: Diagnosis not present

## 2017-01-08 DIAGNOSIS — Z6823 Body mass index (BMI) 23.0-23.9, adult: Secondary | ICD-10-CM | POA: Diagnosis not present

## 2017-01-08 DIAGNOSIS — M1991 Primary osteoarthritis, unspecified site: Secondary | ICD-10-CM | POA: Diagnosis not present

## 2017-02-10 DIAGNOSIS — Z6823 Body mass index (BMI) 23.0-23.9, adult: Secondary | ICD-10-CM | POA: Diagnosis not present

## 2017-02-10 DIAGNOSIS — G894 Chronic pain syndrome: Secondary | ICD-10-CM | POA: Diagnosis not present

## 2017-02-11 ENCOUNTER — Telehealth: Payer: Self-pay | Admitting: *Deleted

## 2017-02-11 ENCOUNTER — Telehealth: Payer: Self-pay

## 2017-02-11 NOTE — Telephone Encounter (Signed)
Called pt assistance (Eliquis) spoke with [place]. I sent in pt's assistance in 02/04/17 and had not heard anything back. Malachi Paradise stated that she would send the paperwork to get expedited and would take up to 3 business days. She stated they will contact patient of the decision. I will check with patient on Tuesday to see if she has been notified of decision. Pt did pick up samples to help with this month.

## 2017-02-11 NOTE — Telephone Encounter (Signed)
Letter received from [place] stating that Patient Assistance for Eliquis is under review. Case # [id number]

## 2017-02-16 ENCOUNTER — Telehealth: Payer: Self-pay | Admitting: *Deleted

## 2017-02-16 NOTE — Telephone Encounter (Signed)
Eliquis 5 mg, 6 month supply arrived at office. Msg left on pt cell phone.

## 2017-02-17 DIAGNOSIS — M7542 Impingement syndrome of left shoulder: Secondary | ICD-10-CM | POA: Insufficient documentation

## 2017-02-17 DIAGNOSIS — Z9889 Other specified postprocedural states: Secondary | ICD-10-CM | POA: Diagnosis not present

## 2017-02-17 DIAGNOSIS — M25512 Pain in left shoulder: Secondary | ICD-10-CM | POA: Diagnosis not present

## 2017-02-17 DIAGNOSIS — M75122 Complete rotator cuff tear or rupture of left shoulder, not specified as traumatic: Secondary | ICD-10-CM | POA: Diagnosis not present

## 2017-02-22 ENCOUNTER — Emergency Department (HOSPITAL_COMMUNITY): Payer: Medicare Other

## 2017-02-22 ENCOUNTER — Other Ambulatory Visit: Payer: Self-pay

## 2017-02-22 ENCOUNTER — Inpatient Hospital Stay (HOSPITAL_COMMUNITY)
Admission: EM | Admit: 2017-02-22 | Discharge: 2017-02-24 | DRG: 193 | Disposition: A | Discharge status: Home Health | Payer: Medicare Other | Attending: Family Medicine | Admitting: Family Medicine

## 2017-02-22 ENCOUNTER — Encounter (HOSPITAL_COMMUNITY): Payer: Self-pay

## 2017-02-22 DIAGNOSIS — K219 Gastro-esophageal reflux disease without esophagitis: Secondary | ICD-10-CM | POA: Diagnosis present

## 2017-02-22 DIAGNOSIS — Z85038 Personal history of other malignant neoplasm of large intestine: Secondary | ICD-10-CM | POA: Diagnosis not present

## 2017-02-22 DIAGNOSIS — J189 Pneumonia, unspecified organism: Secondary | ICD-10-CM | POA: Diagnosis present

## 2017-02-22 DIAGNOSIS — J4520 Mild intermittent asthma, uncomplicated: Secondary | ICD-10-CM | POA: Diagnosis not present

## 2017-02-22 DIAGNOSIS — R0902 Hypoxemia: Secondary | ICD-10-CM | POA: Diagnosis present

## 2017-02-22 DIAGNOSIS — Z884 Allergy status to anesthetic agent status: Secondary | ICD-10-CM

## 2017-02-22 DIAGNOSIS — I10 Essential (primary) hypertension: Secondary | ICD-10-CM | POA: Diagnosis present

## 2017-02-22 DIAGNOSIS — R531 Weakness: Secondary | ICD-10-CM | POA: Diagnosis not present

## 2017-02-22 DIAGNOSIS — Z7901 Long term (current) use of anticoagulants: Secondary | ICD-10-CM | POA: Diagnosis not present

## 2017-02-22 DIAGNOSIS — J40 Bronchitis, not specified as acute or chronic: Secondary | ICD-10-CM | POA: Diagnosis present

## 2017-02-22 DIAGNOSIS — E860 Dehydration: Secondary | ICD-10-CM | POA: Diagnosis not present

## 2017-02-22 DIAGNOSIS — J209 Acute bronchitis, unspecified: Secondary | ICD-10-CM | POA: Diagnosis not present

## 2017-02-22 DIAGNOSIS — E785 Hyperlipidemia, unspecified: Secondary | ICD-10-CM | POA: Diagnosis not present

## 2017-02-22 DIAGNOSIS — M069 Rheumatoid arthritis, unspecified: Secondary | ICD-10-CM | POA: Diagnosis present

## 2017-02-22 DIAGNOSIS — Z888 Allergy status to other drugs, medicaments and biological substances status: Secondary | ICD-10-CM

## 2017-02-22 DIAGNOSIS — J9601 Acute respiratory failure with hypoxia: Secondary | ICD-10-CM | POA: Diagnosis present

## 2017-02-22 DIAGNOSIS — J181 Lobar pneumonia, unspecified organism: Principal | ICD-10-CM | POA: Diagnosis present

## 2017-02-22 DIAGNOSIS — J22 Unspecified acute lower respiratory infection: Secondary | ICD-10-CM | POA: Diagnosis not present

## 2017-02-22 DIAGNOSIS — G8911 Acute pain due to trauma: Secondary | ICD-10-CM | POA: Diagnosis present

## 2017-02-22 DIAGNOSIS — R05 Cough: Secondary | ICD-10-CM | POA: Diagnosis not present

## 2017-02-22 DIAGNOSIS — R0602 Shortness of breath: Secondary | ICD-10-CM

## 2017-02-22 DIAGNOSIS — Z66 Do not resuscitate: Secondary | ICD-10-CM | POA: Diagnosis not present

## 2017-02-22 DIAGNOSIS — Z6823 Body mass index (BMI) 23.0-23.9, adult: Secondary | ICD-10-CM | POA: Diagnosis not present

## 2017-02-22 DIAGNOSIS — G8929 Other chronic pain: Secondary | ICD-10-CM | POA: Diagnosis not present

## 2017-02-22 DIAGNOSIS — R109 Unspecified abdominal pain: Secondary | ICD-10-CM

## 2017-02-22 DIAGNOSIS — R1010 Upper abdominal pain, unspecified: Secondary | ICD-10-CM | POA: Diagnosis not present

## 2017-02-22 DIAGNOSIS — I480 Paroxysmal atrial fibrillation: Secondary | ICD-10-CM | POA: Diagnosis not present

## 2017-02-22 DIAGNOSIS — J4521 Mild intermittent asthma with (acute) exacerbation: Secondary | ICD-10-CM | POA: Diagnosis present

## 2017-02-22 DIAGNOSIS — Z91041 Radiographic dye allergy status: Secondary | ICD-10-CM | POA: Diagnosis not present

## 2017-02-22 DIAGNOSIS — I4891 Unspecified atrial fibrillation: Secondary | ICD-10-CM | POA: Diagnosis not present

## 2017-02-22 DIAGNOSIS — Z79899 Other long term (current) drug therapy: Secondary | ICD-10-CM | POA: Diagnosis not present

## 2017-02-22 DIAGNOSIS — I9589 Other hypotension: Secondary | ICD-10-CM | POA: Diagnosis not present

## 2017-02-22 DIAGNOSIS — M25512 Pain in left shoulder: Secondary | ICD-10-CM | POA: Diagnosis present

## 2017-02-22 HISTORY — DX: Unspecified atrial fibrillation: I48.91

## 2017-02-22 LAB — LACTIC ACID, PLASMA: Lactic Acid, Venous: 1.7 mmol/L (ref 0.5–1.9)

## 2017-02-22 LAB — COMPREHENSIVE METABOLIC PANEL
ALT: 16 U/L (ref 14–54)
AST: 29 U/L (ref 15–41)
Albumin: 2.9 g/dL — ABNORMAL LOW (ref 3.5–5.0)
Alkaline Phosphatase: 111 U/L (ref 38–126)
Anion gap: 15 (ref 5–15)
BILIRUBIN TOTAL: 1.1 mg/dL (ref 0.3–1.2)
BUN: 37 mg/dL — ABNORMAL HIGH (ref 6–20)
CHLORIDE: 100 mmol/L — AB (ref 101–111)
CO2: 21 mmol/L — ABNORMAL LOW (ref 22–32)
CREATININE: 0.99 mg/dL (ref 0.44–1.00)
Calcium: 8.1 mg/dL — ABNORMAL LOW (ref 8.9–10.3)
GFR, EST NON AFRICAN AMERICAN: 52 mL/min — AB (ref >60)
Glucose, Bld: 114 mg/dL — ABNORMAL HIGH (ref 65–99)
POTASSIUM: 4.3 mmol/L (ref 3.5–5.1)
Sodium: 136 mmol/L (ref 135–145)
TOTAL PROTEIN: 7 g/dL (ref 6.5–8.1)

## 2017-02-22 LAB — CBC WITH DIFFERENTIAL/PLATELET
BASOS PCT: 0 %
Basophils Absolute: 0 10*3/uL (ref 0.0–0.1)
EOS ABS: 0.1 10*3/uL (ref 0.0–0.7)
EOS PCT: 1 %
HCT: 36.5 % (ref 36.0–46.0)
Hemoglobin: 11.7 g/dL — ABNORMAL LOW (ref 12.0–15.0)
LYMPHS ABS: 1.4 10*3/uL (ref 0.7–4.0)
Lymphocytes Relative: 9 %
MCH: 28.4 pg (ref 26.0–34.0)
MCHC: 32.1 g/dL (ref 30.0–36.0)
MCV: 88.6 fL (ref 78.0–100.0)
MONOS PCT: 14 %
Monocytes Absolute: 2.2 10*3/uL — ABNORMAL HIGH (ref 0.1–1.0)
Neutro Abs: 11.6 10*3/uL — ABNORMAL HIGH (ref 1.7–7.7)
Neutrophils Relative %: 76 %
PLATELETS: 275 10*3/uL (ref 150–400)
RBC: 4.12 MIL/uL (ref 3.87–5.11)
RDW: 14.7 % (ref 11.5–15.5)
WBC: 15.3 10*3/uL — AB (ref 4.0–10.5)

## 2017-02-22 LAB — BRAIN NATRIURETIC PEPTIDE: B NATRIURETIC PEPTIDE 5: 221 pg/mL — AB (ref 0.0–100.0)

## 2017-02-22 LAB — URINALYSIS, ROUTINE W REFLEX MICROSCOPIC
BILIRUBIN URINE: NEGATIVE
Glucose, UA: NEGATIVE mg/dL
Hgb urine dipstick: NEGATIVE
KETONES UR: NEGATIVE mg/dL
Leukocytes, UA: NEGATIVE
NITRITE: NEGATIVE
PROTEIN: NEGATIVE mg/dL
Specific Gravity, Urine: 1.023 (ref 1.005–1.030)
pH: 5 (ref 5.0–8.0)

## 2017-02-22 LAB — PHOSPHORUS: Phosphorus: 3.4 mg/dL (ref 2.5–4.6)

## 2017-02-22 LAB — INFLUENZA PANEL BY PCR (TYPE A & B)
Influenza A By PCR: NEGATIVE
Influenza B By PCR: NEGATIVE

## 2017-02-22 LAB — MAGNESIUM: MAGNESIUM: 1.9 mg/dL (ref 1.7–2.4)

## 2017-02-22 LAB — TSH: TSH: 0.441 u[IU]/mL (ref 0.350–4.500)

## 2017-02-22 LAB — LIPASE, BLOOD: LIPASE: 27 U/L (ref 11–51)

## 2017-02-22 LAB — TROPONIN I: TROPONIN I: 0.04 ng/mL — AB (ref <0.03)

## 2017-02-22 MED ORDER — ONDANSETRON HCL 4 MG/2ML IJ SOLN: 4.0000 mg | Freq: Four times a day (QID) | INTRAMUSCULAR | Status: DC | PRN

## 2017-02-22 MED ORDER — APIXABAN 5 MG PO TABS: 5.0000 mg | ORAL_TABLET | Freq: Two times a day (BID) | ORAL | Status: DC

## 2017-02-22 MED ORDER — ACETAMINOPHEN 325 MG PO TABS
650.0000 mg | ORAL_TABLET | Freq: Four times a day (QID) | ORAL | Status: DC | PRN
  Administered 2017-02-22: 650 mg via ORAL
  Filled 2017-02-22: qty 2

## 2017-02-22 MED ORDER — BENZONATATE 100 MG PO CAPS: 100.0000 mg | ORAL_CAPSULE | Freq: Three times a day (TID) | ORAL | Status: DC | PRN

## 2017-02-22 MED ORDER — IPRATROPIUM-ALBUTEROL 0.5-2.5 (3) MG/3ML IN SOLN: 3.0000 mL | Freq: Once | RESPIRATORY_TRACT | Status: DC

## 2017-02-22 MED ORDER — SODIUM CHLORIDE 0.9 % IV SOLN
INTRAVENOUS | Status: DC
  Administered 2017-02-22 – 2017-02-23 (×4): via INTRAVENOUS

## 2017-02-22 MED ORDER — OMEGA-3-ACID ETHYL ESTERS 1 G PO CAPS
2.0000 g | ORAL_CAPSULE | Freq: Every day | ORAL | Status: DC
  Administered 2017-02-23 – 2017-02-24 (×2): 2 g via ORAL
  Filled 2017-02-22 (×3): qty 2

## 2017-02-22 MED ORDER — LEFLUNOMIDE 10 MG PO TABS
20.0000 mg | ORAL_TABLET | Freq: Every day | ORAL | Status: DC
  Administered 2017-02-22 – 2017-02-24 (×3): 20 mg via ORAL
  Filled 2017-02-22 (×5): qty 2

## 2017-02-22 MED ORDER — FLUTICASONE PROPIONATE 50 MCG/ACT NA SUSP
1.0000 | Freq: Every day | NASAL | Status: DC
  Administered 2017-02-22 – 2017-02-24 (×3): 1 via NASAL
  Filled 2017-02-22: qty 16

## 2017-02-22 MED ORDER — LORATADINE 10 MG PO TABS
10.0000 mg | ORAL_TABLET | Freq: Every day | ORAL | Status: DC
Start: 2017-02-22 — End: 2017-02-24
  Administered 2017-02-22 – 2017-02-24 (×3): 10 mg via ORAL
  Filled 2017-02-22 (×4): qty 1

## 2017-02-22 MED ORDER — IPRATROPIUM-ALBUTEROL 0.5-2.5 (3) MG/3ML IN SOLN
3.0000 mL | Freq: Once | RESPIRATORY_TRACT | Status: AC
  Administered 2017-02-22: 3 mL via RESPIRATORY_TRACT
  Filled 2017-02-22: qty 3

## 2017-02-22 MED ORDER — DILTIAZEM HCL-DEXTROSE 100-5 MG/100ML-% IV SOLN (PREMIX)
5.0000 mg/h | Freq: Once | INTRAVENOUS | Status: AC
  Administered 2017-02-22: 5 mg/h via INTRAVENOUS
  Filled 2017-02-22: qty 100

## 2017-02-22 MED ORDER — PANTOPRAZOLE SODIUM 40 MG PO TBEC
40.0000 mg | DELAYED_RELEASE_TABLET | Freq: Every day | ORAL | Status: DC
  Administered 2017-02-22 – 2017-02-24 (×3): 40 mg via ORAL
  Filled 2017-02-22 (×5): qty 1

## 2017-02-22 MED ORDER — METOPROLOL TARTRATE 50 MG PO TABS
ORAL_TABLET | ORAL | Status: AC
  Administered 2017-02-22: 50 mg
  Filled 2017-02-22: qty 1

## 2017-02-22 MED ORDER — SODIUM CHLORIDE 0.9 % IV BOLUS (SEPSIS)
500.0000 mL | Freq: Once | INTRAVENOUS | Status: AC
  Administered 2017-02-22: 500 mL via INTRAVENOUS

## 2017-02-22 MED ORDER — METHYLPREDNISOLONE SODIUM SUCC 40 MG IJ SOLR
40.0000 mg | Freq: Two times a day (BID) | INTRAMUSCULAR | Status: DC
  Administered 2017-02-22 – 2017-02-24 (×4): 40 mg via INTRAVENOUS
  Filled 2017-02-22 (×4): qty 1

## 2017-02-22 MED ORDER — PANTOPRAZOLE SODIUM 40 MG PO TBEC: 40.0000 mg | DELAYED_RELEASE_TABLET | Freq: Every day | ORAL | Status: DC

## 2017-02-22 MED ORDER — ONDANSETRON HCL 4 MG PO TABS: 4.0000 mg | ORAL_TABLET | Freq: Four times a day (QID) | ORAL | Status: DC | PRN

## 2017-02-22 MED ORDER — APIXABAN 2.5 MG PO TABS
2.5000 mg | ORAL_TABLET | Freq: Two times a day (BID) | ORAL | Status: DC
  Administered 2017-02-23 – 2017-02-24 (×3): 2.5 mg via ORAL
  Filled 2017-02-22 (×4): qty 1

## 2017-02-22 MED ORDER — LEFLUNOMIDE 20 MG PO TABS
20.0000 mg | ORAL_TABLET | Freq: Every day | ORAL | Status: DC
  Filled 2017-02-22 (×3): qty 1

## 2017-02-22 MED ORDER — IPRATROPIUM-ALBUTEROL 0.5-2.5 (3) MG/3ML IN SOLN: 3.0000 mL | Freq: Four times a day (QID) | RESPIRATORY_TRACT | Status: DC | PRN

## 2017-02-22 MED ORDER — VITAMIN D (ERGOCALCIFEROL) 1.25 MG (50000 UNIT) PO CAPS: 50000.0000 [IU] | ORAL_CAPSULE | ORAL | Status: DC

## 2017-02-22 MED ORDER — CEFUROXIME AXETIL 250 MG PO TABS
250.0000 mg | ORAL_TABLET | Freq: Two times a day (BID) | ORAL | Status: DC
  Administered 2017-02-22 – 2017-02-24 (×4): 250 mg via ORAL
  Filled 2017-02-22 (×6): qty 1

## 2017-02-22 MED ORDER — METOPROLOL SUCCINATE ER 50 MG PO TB24
50.0000 mg | ORAL_TABLET | Freq: Every day | ORAL | Status: DC
  Administered 2017-02-23 – 2017-02-24 (×2): 50 mg via ORAL
  Filled 2017-02-22 (×2): qty 1

## 2017-02-22 MED ORDER — TRAMADOL HCL 50 MG PO TABS: 50.0000 mg | ORAL_TABLET | Freq: Three times a day (TID) | ORAL | Status: DC | PRN

## 2017-02-22 MED ORDER — BUDESONIDE 0.5 MG/2ML IN SUSP
0.5000 mg | Freq: Two times a day (BID) | RESPIRATORY_TRACT | Status: DC
  Administered 2017-02-22 – 2017-02-24 (×4): 0.5 mg via RESPIRATORY_TRACT
  Filled 2017-02-22 (×4): qty 2

## 2017-02-22 MED ORDER — HYDROCODONE-ACETAMINOPHEN 5-325 MG PO TABS
1.0000 | ORAL_TABLET | Freq: Four times a day (QID) | ORAL | Status: DC | PRN
  Administered 2017-02-22: 1 via ORAL
  Administered 2017-02-23 (×2): 2 via ORAL
  Filled 2017-02-22 (×2): qty 2
  Filled 2017-02-22: qty 1
  Filled 2017-02-22: qty 2

## 2017-02-22 NOTE — ED Provider Notes (Signed)
[facility] EMERGENCY DEPARTMENT Provider Note   CSN: [id number] Arrival date & time: 02/22/17  0850     History   Chief Complaint Chief Complaint  Patient presents with  . Weakness  . Cough    HPI [name] is a 84 y.o. female.  HPI  Pt was seen at 0905.  Per pt and her family, c/o gradual onset and worsening of persistent cough for the past 2 weeks. Has been associated with one home fever to "100.7" yesterday, SOB, poor PO intake, generalized weakness/fatigue and several intermittent episodes of N/V/D 5 days ago. Pt states her N/V/D has since resolved. Pt was evaluated by her PMD this morning, told her "BP was low," and told to come to the ED for further evaluation. Denies black or blood in stools or emesis, no back pain, no CP/palpitations, no rash, no focal motor weakness.     Past Medical History:  Diagnosis Date  . A-fib ([place])   . Atrial fibrillation ([place])   . Cervicalgia   . Colon cancer ([place])   . Degeneration of cervical intervertebral disc   . Hypertension   . Pinched nerve in shoulder   . Rheumatoid arthritis(714.0)   . RUQ pain   . Vulvar dystrophy   . Yeast infection     Patient Active Problem List   Diagnosis Date Noted  . Vulvar dystrophy 05/23/2014  . Atrial fibrillation ([place]) 08/10/2013  . Essential hypertension 07/04/2013  . Tachycardia 01/31/2013  . Dystrophy, vulva 05/04/2012  . Abdominal pain, epigastric 03/28/2012  . Dyspepsia 04/14/2011  . History of colon cancer 03/17/2011  . Diarrhea 03/17/2011  . HYPERLIPIDEMIA 11/30/2006  . BRONCHIECTASIS 11/30/2006  . RHEUMATOID ARTHRITIS 11/30/2006    Past Surgical History:  Procedure Laterality Date  . ABDOMINAL HYSTERECTOMY  1960'S  . APPENDECTOMY  with hysterectomy  . BACK SURGERY  12/08/10   plate/screws C3 C4  . CHOLECYSTECTOMY  2002  . COLONOSCOPY  04/02/2009  . COLONOSCOPY  06/04/05  . COLONOSCOPY N/A 07/26/2015   Procedure: COLONOSCOPY;  Surgeon: Rogene Houston, MD;   Location: [facility];  Service: Endoscopy;  Laterality: N/A;  1240  . COLONOSCOPY WITH ESOPHAGOGASTRODUODENOSCOPY (EGD) N/A 04/08/2012   Procedure: COLONOSCOPY WITH ESOPHAGOGASTRODUODENOSCOPY (EGD);  Surgeon: Rogene Houston, MD;  Location: [facility];  Service: Endoscopy;  Laterality: N/A;  130-moved to 245 Ann notified pt  . ESOPHAGOGASTRODUODENOSCOPY N/A 07/26/2015   Procedure: ESOPHAGOGASTRODUODENOSCOPY (EGD);  Surgeon: Rogene Houston, MD;  Location: [facility];  Service: Endoscopy;  Laterality: N/A;  . HEMICOLECTOMY  10/05    OB History    Gravida Para Term Preterm AB Living   4 4       4    SAB TAB Ectopic Multiple Live Births           4       Home Medications    Prior to Admission medications   Medication Sig Start Date End Date Taking? Authorizing Provider  amLODipine (NORVASC) 5 MG tablet take 1 tablet by mouth once daily 10/13/16   Herminio Commons, MD  apixaban (ELIQUIS) 5 MG TABS tablet Take 1 tablet (5 mg total) by mouth 2 (two) times daily. Patient taking differently: Take 5 mg by mouth daily.  08/17/16   Herminio Commons, MD  aspirin EC 81 MG tablet Take 81 mg by mouth daily.    [provider]  ergocalciferol (VITAMIN D2) 50000 UNITS capsule Take 50,000 Units by mouth once a week. Takes  on Thursday    [provider]  HYDROcodone-acetaminophen (NORCO/VICODIN) 5-325 MG tablet Take 1 tablet by mouth every 6 (six) hours as needed.    [provider]  leflunomide (ARAVA) 20 MG tablet Take 20 mg by mouth daily.    [provider]  lisinopril (PRINIVIL,ZESTRIL) 20 MG tablet Take 1 tablet (20 mg total) by mouth 2 (two) times daily. 11/13/16   Herminio Commons, MD  metoprolol succinate (TOPROL-XL) 50 MG 24 hr tablet take 1 tablet by mouth twice a day 03/25/16   Herminio Commons, MD  omeprazole (PRILOSEC) 20 MG capsule take 1 capsule by mouth twice a day 12/16/15   Vaughan Basta, Rona Ravens, NP  traMADol (ULTRAM) 50 MG tablet Take 50  mg by mouth every 6 (six) hours as needed.    [provider]    Family History Family History  Problem Relation Age of Onset  . Rheum arthritis Mother   . Parkinsonism Father   . Hiatal hernia Sister   . Healthy Brother   . Healthy Daughter   . Healthy Son   . Healthy Son     Social History Social History   Tobacco Use  . Smoking status: Never Smoker  . Smokeless tobacco: Never Used  Substance Use Topics  . Alcohol use: No  . Drug use: No     Allergies   Iodinated diagnostic agents; Nerve block tray; Nitrofuran derivatives; Triamcinolone acetonide; and Vibra-tab [doxycycline]   Review of Systems Review of Systems ROS: Statement: All systems negative except as marked or noted in the HPI; Constitutional: +fever, generalized weakness/fatigue.. ; ; Eyes: Negative for eye pain, redness and discharge. ; ; ENMT: Negative for ear pain, hoarseness, nasal congestion, sinus pressure and sore throat. ; ; Cardiovascular: Negative for chest pain, palpitations, diaphoresis, and peripheral edema. ; ; Respiratory: +cough, SOB. Negative for wheezing and stridor. ; ; Gastrointestinal: +N/V/D, poor PO intake.. Negative for abdominal pain, blood in stool, hematemesis, jaundice and rectal bleeding. . ; ; Genitourinary: Negative for dysuria, flank pain and hematuria. ; ; Musculoskeletal: Negative for back pain and neck pain. Negative for swelling and trauma.; ; Skin: Negative for pruritus, rash, abrasions, blisters, bruising and skin lesion.; ; Neuro: Negative for headache, lightheadedness and neck stiffness. Negative for altered level of consciousness, altered mental status, extremity weakness, paresthesias, involuntary movement, seizure and syncope.       Physical Exam Updated Vital Signs BP (!) 107/48   Pulse (!) 107   Temp 98.9 F (37.2 C) (Oral)   Resp (!) 22   Ht 5\' 2"  (1.575 m)   Wt 54.4 kg (120 lb)   SpO2 95%   BMI 21.95 kg/m    Patient Vitals for the past 24 hrs:  BP  Temp Temp src Pulse Resp SpO2 Height Weight  02/22/17 1121 - - - (!) 103 (!) 22 96 % - -  02/22/17 1116 133/66 - - (!) 109 (!) 22 96 % - -  02/22/17 1100 130/63 - - (!) 113 (!) 26 97 % - -  02/22/17 1053 - - - (!) 114 (!) 22 94 % - -  02/22/17 1050 - - - - - 93 % - -  02/22/17 1045 (!) 103/58 - - (!) 109 (!) 31 93 % - -  02/22/17 1039 112/65 - - (!) 104 (!) 27 91 % - -  02/22/17 1000 (!) 90/54 - - - - - - -  02/22/17 0948 - - - - - 95 % - -  02/22/17 0945 (!) 107/48 - - (!) 107 (!) 22 94 % - -  02/22/17 0938 129/72 - - (!) 112 (!) 29 94 % - -  02/22/17 0937 - - - (!) 130 (!) 24 95 % - -  02/22/17 0936 - - - (!) 106 (!) 27 93 % - -  02/22/17 0935 (!) 118/102 - - (!) 102 (!) 21 95 % - -  02/22/17 0934 122/74 - - (!) 110 (!) 25 95 % - -  02/22/17 0930 (!) 107/58 - - 100 (!) 34 93 % - -  02/22/17 0919 117/73 98.9 F (37.2 C) Oral (!) 119 (!) 21 (!) 88 % 5\' 2"  (1.575 m) 54.4 kg (120 lb)  02/22/17 0918 - - - - - (!) 88 % - -   09:38:02 Orthostatic Vital Signs RE  Orthostatic Lying   BP- Lying: 122/74  Pulse- Lying: 124      Orthostatic Sitting  BP- Sitting: 129/72  Pulse- Sitting: 146    Physical Exam 0910: Physical examination:  Nursing notes reviewed; Vital signs and O2 SAT reviewed;  Constitutional: Well developed, Well nourished, Uncomfortable appearing.; Head:  Normocephalic, atraumatic; Eyes: EOMI, PERRL, No scleral icterus; ENMT: Mouth and pharynx normal, Mucous membranes dry; Neck: Supple, Full range of motion, No lymphadenopathy; Cardiovascular: Irregular tachycardic rate and rhythm, No gallop; Respiratory: Breath sounds coarse & equal bilaterally, No wheezes. Speaking short sentences, sitting upright. Tachypneic. Normal respiratory effort/excursion; Chest: Nontender, Movement normal; Abdomen: Soft, +RUQ, mid-epigastric, LUQ tenderness to palp. No rebound or guarding. Nondistended, Normal bowel sounds; Genitourinary: No CVA tenderness; Extremities: Pulses normal, No tenderness,  No edema, No calf edema or asymmetry.; Neuro: AA&Ox3, Major CN grossly intact.  Speech clear. No gross focal motor or sensory deficits in extremities.; Skin: Color pale, Warm, Dry.   ED Treatments / Results  Labs (all labs ordered are listed, but only abnormal results are displayed)   EKG  EKG Interpretation  Date/Time:  Monday February 22 2017 09:13:22 EST Ventricular Rate:  123 PR Interval:    QRS Duration: 83 QT Interval:  293 QTC Calculation: 420 R Axis:   -50 Text Interpretation:  Atrial fibrillation LAD, consider left anterior fascicular block Nonspecific T abnormalities, diffuse leads When compared with ECG of 01/02/2017 Nonspecific ST and T wave abnormality is now Present Confirmed by Francine Graven [phone]) on 02/22/2017 9:29:18 AM       Radiology   Procedures Procedures (including critical care time)  Medications Ordered in ED Medications  ipratropium-albuterol (DUONEB) 0.5-2.5 (3) MG/3ML nebulizer solution 3 mL (not administered)  0.9 %  sodium chloride infusion ( Intravenous New Bag/Given 02/22/17 0942)  diltiazem (CARDIZEM) 100 mg in dextrose 5% 132mL (1 mg/mL) infusion (5 mg/hr Intravenous New Bag/Given 02/22/17 0937)     Initial Impression / Assessment and Plan / ED Course  I have reviewed the triage vital signs and the nursing notes.  Pertinent labs & imaging results that were available during my care of the patient were reviewed by me and considered in my medical decision making (see chart for details).  MDM Reviewed: previous chart, nursing note and vitals Reviewed previous: labs and ECG Interpretation: labs, ECG and x-ray Total time providing critical care: 30-74 minutes. This excludes time spent performing separately reportable procedures and services. Consults: admitting MD   CRITICAL CARE Performed by: Alfonzo Feller Total critical care time: 45 minutes Critical care time was exclusive of separately billable procedures and treating other  patients. Critical care was necessary to treat or  prevent imminent or life-threatening deterioration. Critical care was time spent personally by me on the following activities: development of treatment plan with patient and/or surrogate as well as nursing, discussions with consultants, evaluation of patient's response to treatment, examination of patient, obtaining history from patient or surrogate, ordering and performing treatments and interventions, ordering and review of laboratory studies, ordering and review of radiographic studies, pulse oximetry and re-evaluation of patient's condition.   Results for orders placed or performed during the hospital encounter of 02/22/17  Comprehensive metabolic panel  Result Value Ref Range   Sodium 136 135 - 145 mmol/L   Potassium 4.3 3.5 - 5.1 mmol/L   Chloride 100 (L) 101 - 111 mmol/L   CO2 21 (L) 22 - 32 mmol/L   Glucose, Bld 114 (H) 65 - 99 mg/dL   BUN 37 (H) 6 - 20 mg/dL   Creatinine, Ser 0.99 0.44 - 1.00 mg/dL   Calcium 8.1 (L) 8.9 - 10.3 mg/dL   Total Protein 7.0 6.5 - 8.1 g/dL   Albumin 2.9 (L) 3.5 - 5.0 g/dL   AST 29 15 - 41 U/L   ALT 16 14 - 54 U/L   Alkaline Phosphatase 111 38 - 126 U/L   Total Bilirubin 1.1 0.3 - 1.2 mg/dL   GFR calc non Af Amer 52 (L) >60 mL/min   GFR calc Af Amer >60 >60 mL/min   Anion gap 15 5 - 15  Brain natriuretic peptide  Result Value Ref Range   B Natriuretic Peptide 221.0 (H) 0.0 - 100.0 pg/mL  Troponin I  Result Value Ref Range   Troponin I 0.04 (HH) <0.03 ng/mL  Lactic acid, plasma  Result Value Ref Range   Lactic Acid, Venous 1.7 0.5 - 1.9 mmol/L  CBC with Differential  Result Value Ref Range   WBC 15.3 (H) 4.0 - 10.5 K/uL   RBC 4.12 3.87 - 5.11 MIL/uL   Hemoglobin 11.7 (L) 12.0 - 15.0 g/dL   HCT 36.5 36.0 - 46.0 %   MCV 88.6 78.0 - 100.0 fL   MCH 28.4 26.0 - 34.0 pg   MCHC 32.1 30.0 - 36.0 g/dL   RDW 14.7 11.5 - 15.5 %   Platelets 275 150 - 400 K/uL   Neutrophils Relative % 76 %   Neutro  Abs 11.6 (H) 1.7 - 7.7 K/uL   Lymphocytes Relative 9 %   Lymphs Abs 1.4 0.7 - 4.0 K/uL   Monocytes Relative 14 %   Monocytes Absolute 2.2 (H) 0.1 - 1.0 K/uL   Eosinophils Relative 1 %   Eosinophils Absolute 0.1 0.0 - 0.7 K/uL   Basophils Relative 0 %   Basophils Absolute 0.0 0.0 - 0.1 K/uL  Urinalysis, Routine w reflex microscopic  Result Value Ref Range   Color, Urine YELLOW YELLOW   APPearance HAZY (A) CLEAR   Specific Gravity, Urine 1.023 1.005 - 1.030   pH 5.0 5.0 - 8.0   Glucose, UA NEGATIVE NEGATIVE mg/dL   Hgb urine dipstick NEGATIVE NEGATIVE   Bilirubin Urine NEGATIVE NEGATIVE   Ketones, ur NEGATIVE NEGATIVE mg/dL   Protein, ur NEGATIVE NEGATIVE mg/dL   Nitrite NEGATIVE NEGATIVE   Leukocytes, UA NEGATIVE NEGATIVE  Lipase, blood  Result Value Ref Range   Lipase 27 11 - 51 U/L   US Abdomen Complete Result Date: 02/22/2017 CLINICAL DATA:  Two days of upper abdominal pain. History of previous cholecystectomy. Recent episode of hypotension and diarrhea. EXAM: ABDOMEN ULTRASOUND COMPLETE COMPARISON:  None in PACs FINDINGS:  Gallbladder: The gallbladder is surgically absent. Common bile duct: Diameter: 5.6 mm Liver: No focal lesion identified. Within normal limits in parenchymal echogenicity. Portal vein is patent on color Doppler imaging with normal direction of blood flow towards the liver. IVC: No abnormality visualized. Pancreas: Visualized portion unremarkable. Spleen: Size and appearance within normal limits. Right Kidney: Length: 11.0 cm. Echogenicity within normal limits. No mass or hydronephrosis visualized. Left Kidney: Length: 11 cm. There is mild splitting of the central echo complex. The renal cortical echotexture is similar to that on the right. No stones or cystic or solid masses are observed. Abdominal aorta: Limited visualization of the bifurcation due to bowel gas. No aneurysm is observed. Other findings: None in PACs IMPRESSION: Normal appearance of the liver and other  upper abdominal viscera. Previous cholecystectomy. Mild hydronephrosis on the left is suspected. No stones are observed. Electronically Signed   By: David  Martinique M.D.   On: 02/22/2017 10:47   Dg Chest Port 1 View Result Date: 02/22/2017 CLINICAL DATA:  Cough for 2 weeks, nausea, vomiting, diarrhea, upper abdominal pain, history atrial fibrillation, hypertension EXAM: PORTABLE CHEST 1 VIEW COMPARISON:  Portable exam 0919 hours compared to 01/02/2017 FINDINGS: Normal heart size, mediastinal contours, and pulmonary vascularity. Atherosclerotic calcification aorta. Chronic elevation of RIGHT diaphragm. Bronchitic and mild chronic interstitial changes again seen. No acute infiltrate, pleural effusion, or pneumothorax. No acute osseous findings. IMPRESSION: Mild chronic bronchitic and interstitial changes. No acute abnormalities. Electronically Signed   By: Lavonia Dana M.D.   On: 02/22/2017 09:42    1115:  On arrival: pt hypoxic, tachypneic, tachycardic. Appears clinically dehydrated. Judicious IVF bolus given for SPB 90's with improvement into 100's and 110's. IV cardizem gtt also started for afib/RVR with rates into 140's; HR now improved to 100's. Short neb x2 given and O2 2L N/C applied with O2 Sats increasing from 88% to 97%. Epic chart reviewed: RUQ is chronic for pt. Will obtain flu testing. Dx and testing d/w pt and family.  Questions answered.  Verb understanding, agreeable to admit. T/C to [facility] Dr. Dyann Kief, case discussed, including:  HPI, pertinent PM/SHx, VS/PE, dx testing, ED course and treatment:  Agreeable to admit.     Final Clinical Impressions(s) / ED Diagnoses   Final diagnoses:  None    ED Discharge Orders    None       Francine Graven, DO 02/24/17 0177

## 2017-02-22 NOTE — ED Triage Notes (Signed)
Patient reports of cough, diarrhea and generalized weakness. Sent form PCP for hypotension. Pressure was 90/45 per family.

## 2017-02-22 NOTE — ED Notes (Signed)
2 LPM applied to patient via [place], sats increased to 94%.

## 2017-02-22 NOTE — H&P (Signed)
History and Physical    [name] ION:[id number] DOB: 1933-05-04 DOA: 02/22/2017  PCP: [name], MD   I have briefly reviewed patients previous medical reports in [facility].  Patient coming from: Home  Chief Complaint: Shortness of breath, general malaise, productive cough and weakness.  HPI: [name] is a 84 year old female with a past medical history significant for atrial fibrillation (on chronic Eliquis), history of rheumatoid arthritis, hypertension, hyperlipidemia and prior history of colon cancer; who presented to the ED with over 4 days of general malaise, productive cough, shortness of breath, nasal congestion and runny nose.  Patient reported associated anorexia.  The day of admission she was seen by her PCP and found her to be hypotensive and mildly hypoxic; at that time she was sent to the ED for further evaluation and treatment. Patient reports some chills but not objective fever, no chest pain, no palpitations, no nausea, no vomiting, or focal deficits.  ED Course: Chest x-ray done demonstrating bronchitis and no acute infiltrate; patient with elevated WBCs on blood work and found to have soft blood pressure with transient A. fib with RVR.  IV fluids given, nebulizer treatments provided and TRH contacted to place patient in observation for further evaluation and treatment.  Review of Systems:  All other systems reviewed and apart from HPI, are negative.  Past Medical History:  Diagnosis Date  . A-fib ([place])   . Atrial fibrillation ([place])   . Cervicalgia   . Colon cancer ([place])   . Degeneration of cervical intervertebral disc   . Hypertension   . Pinched nerve in shoulder   . Rheumatoid arthritis(714.0)   . RUQ pain   . Vulvar dystrophy   . Yeast infection     Past Surgical History:  Procedure Laterality Date  . ABDOMINAL HYSTERECTOMY  1960'S  . APPENDECTOMY  with hysterectomy  . BACK SURGERY  12/08/10   plate/screws C3 C4  .  CHOLECYSTECTOMY  2002  . COLONOSCOPY  04/02/2009  . COLONOSCOPY  06/04/05  . COLONOSCOPY N/A 07/26/2015   Procedure: COLONOSCOPY;  Surgeon: Rogene Houston, MD;  [facility];  Service: Endoscopy;  Laterality: N/A;  1240  . COLONOSCOPY WITH ESOPHAGOGASTRODUODENOSCOPY (EGD) N/A 04/08/2012   Procedure: COLONOSCOPY WITH ESOPHAGOGASTRODUODENOSCOPY (EGD);  Surgeon: Rogene Houston, MD;  Location: [facility];  Service: Endoscopy;  Laterality: N/A;  130-moved to 245 Ann notified pt  . ESOPHAGOGASTRODUODENOSCOPY N/A 07/26/2015   Procedure: ESOPHAGOGASTRODUODENOSCOPY (EGD);  Surgeon: Rogene Houston, MD;  Location: [facility];  Service: Endoscopy;  Laterality: N/A;  . HEMICOLECTOMY  10/05    Social History  reports that  has never smoked. she has never used smokeless tobacco. She reports that she does not drink alcohol or use drugs.  Allergies  Allergen Reactions  . Iodinated Diagnostic Agents Anaphylaxis    08-12-10 Pt had severe reaction with itching/hives/loss of consciousness within 5 minutes of CEPI today.  She received both Kenalog and Iondinated Contrast, so not certain which agent caused reaction.  If patient returns for another CEPI, Dr. Jobe Igo considers using Decadron and a 13-hour prep.  jkl  . Nerve Block Tray Anaphylaxis    Patient states that she was sent to have Epidural for her shoulder. After rec'ving she remembers saying that she was itching all over  And that's it. She was transported to hospital  By EMS. Stayed overnight.  [place] Spikes Derivatives Hives and Nausea And Vomiting  . Triamcinolone Acetonide Anaphylaxis  08-12-10  Pt had severe reaction with itching/hives/loss of consciousness within five minutes of CEPI today.  She received both Kenalog and Iodinated Contrast, so not certain which agent caused reaction.  If pt returns for another cervical epidural steroid injection, Dr. Jobe Igo suggests using Decadron and a 13-hr prep.  JKL PATIENT USES PROCTOCREAM 2.5%  WITHOUT DIFFICULTY FOR VULVAR DYSTROPHY Patient says she can take prednisone without any problem  . Vibra-Tab [Doxycycline] Nausea And Vomiting    Family History  Problem Relation Age of Onset  . Rheum arthritis Mother   . Parkinsonism Father   . Hiatal hernia Sister   . Healthy Brother   . Healthy Daughter   . Healthy Son   . Healthy Son     Prior to Admission medications   Medication Sig Start Date End Date Taking? Authorizing Provider  amLODipine (NORVASC) 5 MG tablet take 1 tablet by mouth once daily 10/13/16  Yes Herminio Commons, MD  apixaban (ELIQUIS) 5 MG TABS tablet Take 1 tablet (5 mg total) by mouth 2 (two) times daily. Patient taking differently: Take 5 mg by mouth daily.  08/17/16  Yes Herminio Commons, MD  HYDROcodone-acetaminophen (NORCO/VICODIN) 5-325 MG tablet Take 1 tablet by mouth every 6 (six) hours as needed.   Yes [provider]  leflunomide (ARAVA) 20 MG tablet Take 20 mg by mouth daily.   Yes [provider]  lisinopril (PRINIVIL,ZESTRIL) 20 MG tablet Take 1 tablet (20 mg total) by mouth 2 (two) times daily. 11/13/16  Yes Herminio Commons, MD  metoprolol succinate (TOPROL-XL) 50 MG 24 hr tablet take 1 tablet by mouth twice a day 03/25/16  Yes Herminio Commons, MD  Omega-3 Fatty Acids (FISH OIL) 1000 MG CAPS Take 1 capsule by mouth daily.   Yes [provider]  traMADol (ULTRAM) 50 MG tablet Take 50 mg by mouth every 6 (six) hours as needed.   Yes [provider]  ergocalciferol (VITAMIN D2) 50000 UNITS capsule Take 50,000 Units by mouth once a week. Takes on Thursday    [provider]  omeprazole (PRILOSEC) 20 MG capsule take 1 capsule by mouth twice a day Patient not taking: Reported on 02/22/2017 12/16/15   Butch Penny, NP    Physical Exam: Vitals:   02/22/17 1215 02/22/17 1228 02/22/17 1230 02/22/17 1333  BP: 108/60  (!) 118/52 (!) 124/59  Pulse: (!) 102 (!) 108 95 87  Resp: (!) 30 (!) 32 (!)  33 (!) 24  Temp:    98 F (36.7 C)  TempSrc:    Oral  SpO2: 93% 95% 95% 94%  Weight:    55.1 kg (121 lb 7.6 oz)  Height:    5\' 2"  (1.575 m)   Constitutional: Afebrile, no chest pain, no nausea, no vomiting.  Patient just feeling bad complaining of general malaise, with productive cough (clear sputum) during examination, and having difficulty speaking in full sentences due to shortness of breath. Eyes: PERTLA, lids and conjunctivae normal, no icterus, no nystagmus ENMT: Mucous membranes dry on exam. Posterior pharynx clear of any exudate or lesions. No thrush Neck: supple, no masses, no thyromegaly, no bruits Respiratory: Diffuse rhonchi, positive expiratory wheezing, no crackles, positive tachypnea; no using accessory muscles. Cardiovascular: Tachycardic, irregular, no rubs, no gallops, soft systolic ejection murmur appreciated on exam; no JVD. Abdomen: No distension, no tenderness, no masses palpated. No hepatosplenomegaly. Bowel sounds normal.  Musculoskeletal: no clubbing / cyanosis. No edema. Good ROM, no contractures. Normal muscle tone.  Skin: no rashes, lesions, ulcers or induration appreciated in limited exam (no wearing medical gown yet).  Neurologic: CN 2-12 grossly intact. Sensation intact, Strength 4/5 in all 4 limbs due to poor effort and deconditioning.Marland Kitchen  Psychiatric: Normal judgment and insight. Alert and oriented x 3. Normal mood.    Labs on Admission: I have personally reviewed following labs and imaging studies  CBC: Recent Labs  Lab 02/22/17 0923  WBC 15.3*  NEUTROABS 11.6*  HGB 11.7*  HCT 36.5  MCV 88.6  PLT 062   Basic Metabolic Panel: Recent Labs  Lab 02/22/17 0923  NA 136  K 4.3  CL 100*  CO2 21*  GLUCOSE 114*  BUN 37*  CREATININE 0.99  CALCIUM 8.1*   Liver Function Tests: Recent Labs  Lab 02/22/17 0923  AST 29  ALT 16  ALKPHOS 111  BILITOT 1.1  PROT 7.0  ALBUMIN 2.9*   Cardiac Enzymes: Recent Labs  Lab 02/22/17 0923  TROPONINI  0.04*   Urine analysis:    Component Value Date/Time   COLORURINE YELLOW 02/22/2017 0914   APPEARANCEUR HAZY (A) 02/22/2017 0914   LABSPEC 1.023 02/22/2017 0914   PHURINE 5.0 02/22/2017 0914   GLUCOSEU NEGATIVE 02/22/2017 0914   HGBUR NEGATIVE 02/22/2017 0914   BILIRUBINUR NEGATIVE 02/22/2017 0914   KETONESUR NEGATIVE 02/22/2017 0914   PROTEINUR NEGATIVE 02/22/2017 0914   UROBILINOGEN 0.2 04/23/2014 1300   NITRITE NEGATIVE 02/22/2017 0914   LEUKOCYTESUR NEGATIVE 02/22/2017 0914    Radiological Exams on Admission: US Abdomen Complete  Result Date: 02/22/2017 CLINICAL DATA:  Two days of upper abdominal pain. History of previous cholecystectomy. Recent episode of hypotension and diarrhea. EXAM: ABDOMEN ULTRASOUND COMPLETE COMPARISON:  None in PACs FINDINGS: Gallbladder: The gallbladder is surgically absent. Common bile duct: Diameter: 5.6 mm Liver: No focal lesion identified. Within normal limits in parenchymal echogenicity. Portal vein is patent on color Doppler imaging with normal direction of blood flow towards the liver. IVC: No abnormality visualized. Pancreas: Visualized portion unremarkable. Spleen: Size and appearance within normal limits. Right Kidney: Length: 11.0 cm. Echogenicity within normal limits. No mass or hydronephrosis visualized. Left Kidney: Length: 11 cm. There is mild splitting of the central echo complex. The renal cortical echotexture is similar to that on the right. No stones or cystic or solid masses are observed. Abdominal aorta: Limited visualization of the bifurcation due to bowel gas. No aneurysm is observed. Other findings: None in PACs IMPRESSION: Normal appearance of the liver and other upper abdominal viscera. Previous cholecystectomy. Mild hydronephrosis on the left is suspected. No stones are observed. Electronically Signed   By: David  Martinique M.D.   On: 02/22/2017 10:47   Dg Chest Port 1 View  Result Date: 02/22/2017 CLINICAL DATA:  Cough for 2 weeks,  nausea, vomiting, diarrhea, upper abdominal pain, history atrial fibrillation, hypertension EXAM: PORTABLE CHEST 1 VIEW COMPARISON:  Portable exam 0919 hours compared to 01/02/2017 FINDINGS: Normal heart size, mediastinal contours, and pulmonary vascularity. Atherosclerotic calcification aorta. Chronic elevation of RIGHT diaphragm. Bronchitic and mild chronic interstitial changes again seen. No acute infiltrate, pleural effusion, or pneumothorax. No acute osseous findings. IMPRESSION: Mild chronic bronchitic and interstitial changes. No acute abnormalities. Electronically Signed   By: Lavonia Dana M.D.   On: 02/22/2017 09:42    EKG:  Atrial fibrillation appreciated.  Assessment/Plan 1-acute reactive airway disease with hypoxia in the setting of bronchitis: -Place in observation -Started on Solu-Medrol, duo nebs, Ceftin and flutter valve -gentle IVF's given -oxygen supplementation as needed  -follow  clinical response -Influenza PCR checked -PRN antitussives, loratadine and flonase  2-HLD (hyperlipidemia) -Continue Lovaza  3-GERD -Continue PPI  4-Essential hypertension -With slight transient hypotensive episode on admission. -For now will only continue metoprolol and provide IV fluids -Follow vital signs.  5-Atrial fibrillation ([place]) -with transient RVR on presentation; In the setting of albuterol use and dehydration. -will provide IVF's -minimize as much as possible usage of Albuterol -resume metoprolol -monitor on telemetry   6-Generalized weakness -due to deconditioning with acute bronchitis  -PT asked to see patient   7-Chronic pain -will continue home analgesic regimen    Time: 60 minutes   DVT prophylaxis: Patient chronically on Eliquis (which satisfy DVT prophylaxis) Code Status: DNR Family Communication: Son at bedside Disposition Plan: will follow assessment by PT; but most likely home with [facility] services.  Consults called: none  Admission status: LOS < 2  midnights, observation, telemetry bed    Barton Dubois MD [facility] Pager [phone]  If 7PM-7AM, please contact night-coverage [email or web address] Password [facility]  02/22/2017, 4:47 PM

## 2017-02-22 NOTE — ED Notes (Signed)
[place] MD gave verbal order to D/c cardizem drip at this time. Give 50 mg of Metoprolol at this time. Pt will still be going to bed 339

## 2017-02-22 NOTE — ED Notes (Signed)
CRITICAL VALUE ALERT  Critical Value:  trop 0.04  Date & Time Notied:  02/22/17 1019  Provider Notified: Thurnell Garbe MD  Orders Received/Actions taken: none

## 2017-02-22 NOTE — Care Management Obs Status (Signed)
[place] NOTIFICATION   Patient Details  Name: [name] MRN: [id number] Date of Birth: October 08, 1933   Medicare Observation Status Notification Given:  Yes    Sherald Barge, RN 02/22/2017, 4:33 PM

## 2017-02-23 DIAGNOSIS — R531 Weakness: Secondary | ICD-10-CM | POA: Diagnosis not present

## 2017-02-23 DIAGNOSIS — J4521 Mild intermittent asthma with (acute) exacerbation: Secondary | ICD-10-CM | POA: Diagnosis not present

## 2017-02-23 DIAGNOSIS — J209 Acute bronchitis, unspecified: Secondary | ICD-10-CM | POA: Diagnosis present

## 2017-02-23 DIAGNOSIS — Z888 Allergy status to other drugs, medicaments and biological substances status: Secondary | ICD-10-CM | POA: Diagnosis not present

## 2017-02-23 DIAGNOSIS — K219 Gastro-esophageal reflux disease without esophagitis: Secondary | ICD-10-CM | POA: Diagnosis present

## 2017-02-23 DIAGNOSIS — J9601 Acute respiratory failure with hypoxia: Secondary | ICD-10-CM

## 2017-02-23 DIAGNOSIS — Z91041 Radiographic dye allergy status: Secondary | ICD-10-CM | POA: Diagnosis not present

## 2017-02-23 DIAGNOSIS — Z884 Allergy status to anesthetic agent status: Secondary | ICD-10-CM | POA: Diagnosis not present

## 2017-02-23 DIAGNOSIS — R0902 Hypoxemia: Secondary | ICD-10-CM | POA: Diagnosis not present

## 2017-02-23 DIAGNOSIS — Z85038 Personal history of other malignant neoplasm of large intestine: Secondary | ICD-10-CM | POA: Diagnosis not present

## 2017-02-23 DIAGNOSIS — I4891 Unspecified atrial fibrillation: Secondary | ICD-10-CM | POA: Diagnosis not present

## 2017-02-23 DIAGNOSIS — R0602 Shortness of breath: Secondary | ICD-10-CM | POA: Diagnosis not present

## 2017-02-23 DIAGNOSIS — J4520 Mild intermittent asthma, uncomplicated: Secondary | ICD-10-CM | POA: Diagnosis present

## 2017-02-23 DIAGNOSIS — Z79899 Other long term (current) drug therapy: Secondary | ICD-10-CM | POA: Diagnosis not present

## 2017-02-23 DIAGNOSIS — J181 Lobar pneumonia, unspecified organism: Secondary | ICD-10-CM | POA: Diagnosis present

## 2017-02-23 DIAGNOSIS — Z7901 Long term (current) use of anticoagulants: Secondary | ICD-10-CM | POA: Diagnosis not present

## 2017-02-23 DIAGNOSIS — G8929 Other chronic pain: Secondary | ICD-10-CM | POA: Diagnosis not present

## 2017-02-23 DIAGNOSIS — E860 Dehydration: Secondary | ICD-10-CM | POA: Diagnosis present

## 2017-02-23 DIAGNOSIS — M069 Rheumatoid arthritis, unspecified: Secondary | ICD-10-CM | POA: Diagnosis present

## 2017-02-23 DIAGNOSIS — R109 Unspecified abdominal pain: Secondary | ICD-10-CM | POA: Diagnosis not present

## 2017-02-23 DIAGNOSIS — I9589 Other hypotension: Secondary | ICD-10-CM | POA: Diagnosis present

## 2017-02-23 DIAGNOSIS — Z66 Do not resuscitate: Secondary | ICD-10-CM | POA: Diagnosis present

## 2017-02-23 DIAGNOSIS — E785 Hyperlipidemia, unspecified: Secondary | ICD-10-CM | POA: Diagnosis present

## 2017-02-23 DIAGNOSIS — I10 Essential (primary) hypertension: Secondary | ICD-10-CM | POA: Diagnosis not present

## 2017-02-23 DIAGNOSIS — I480 Paroxysmal atrial fibrillation: Secondary | ICD-10-CM | POA: Diagnosis not present

## 2017-02-23 DIAGNOSIS — J40 Bronchitis, not specified as acute or chronic: Secondary | ICD-10-CM | POA: Diagnosis not present

## 2017-02-23 LAB — CBC
HCT: 34.4 % — ABNORMAL LOW (ref 36.0–46.0)
Hemoglobin: 10.9 g/dL — ABNORMAL LOW (ref 12.0–15.0)
MCH: 27.9 pg (ref 26.0–34.0)
MCHC: 31.7 g/dL (ref 30.0–36.0)
MCV: 88.2 fL (ref 78.0–100.0)
PLATELETS: 258 10*3/uL (ref 150–400)
RBC: 3.9 MIL/uL (ref 3.87–5.11)
RDW: 14.9 % (ref 11.5–15.5)
WBC: 7.6 10*3/uL (ref 4.0–10.5)

## 2017-02-23 LAB — BASIC METABOLIC PANEL
Anion gap: 13 (ref 5–15)
BUN: 31 mg/dL — AB (ref 6–20)
CHLORIDE: 105 mmol/L (ref 101–111)
CO2: 20 mmol/L — AB (ref 22–32)
CREATININE: 0.7 mg/dL (ref 0.44–1.00)
Calcium: 7.8 mg/dL — ABNORMAL LOW (ref 8.9–10.3)
GFR calc Af Amer: >60 mL/min (ref >60)
GFR calc non Af Amer: >60 mL/min (ref >60)
GLUCOSE: 159 mg/dL — AB (ref 65–99)
Potassium: 3.7 mmol/L (ref 3.5–5.1)
Sodium: 138 mmol/L (ref 135–145)

## 2017-02-23 LAB — GLUCOSE, CAPILLARY: Glucose-Capillary: 167 mg/dL — ABNORMAL HIGH (ref 65–99)

## 2017-02-23 NOTE — Evaluation (Signed)
Physical Therapy Evaluation Patient Details Name: [name] MRN: [id number] DOB: 06-02-1933 Today's Date: 02/23/2017   History of Present Illness   [name] is a 84 year old female with a past medical history significant for atrial fibrillation (on chronic Eliquis), history of rheumatoid arthritis, hypertension, hyperlipidemia and prior history of colon cancer; who presented to the ED with over 4 days of general malaise, productive cough, shortness of breath, nasal congestion and runny nose.  Patient reported associated anorexia.  The day of admission she was seen by her PCP and found her to be hypotensive and mildly hypoxic; at that time she was sent to the ED for further evaluation and treatment. Patient reports some chills but not objective fever, no chest pain, no palpitations, no nausea, no vomiting, or focal deficits.   Clinical Impression  Patient is a 84 year old female who presents to hospital with symptoms of bronchitis. Patient exhibits deficits in endurance, balance, strength, coordination, and functional mobility/gait skills. Patient would continue to benefit from skilled physical therapy in current environment and next venue to continue return to prior function and increase strength, endurance, balance, coordination, and functional mobility and gait skills.     Follow Up Recommendations Home health PT    Equipment Recommendations  None at this time. Other (comment)(Patient may benefit from Rollator with seat in the future but is currently too unsteady and weak to operate safely)    Recommendations for Other Services       Precautions / Restrictions Precautions Precautions: Fall Restrictions Weight Bearing Restrictions: No      Mobility  Bed Mobility Overal bed mobility: Modified Independent                Transfers Overall transfer level: Modified independent Equipment used: Rolling walker (2 wheeled)                 Ambulation/Gait Ambulation/Gait assistance: Min assist;Min guard Ambulation Distance (Feet): 50 Feet Assistive device: Rolling walker (2 wheeled) Gait Pattern/deviations: Shuffle;Decreased stride length;Decreased step length - left;Decreased step length - right   Gait velocity interpretation: Below normal speed for age/gender    Stairs            Wheelchair Mobility    Modified Rankin (Stroke Patients Only)       Balance Overall balance assessment: Needs assistance;History of Falls Sitting-balance support: No upper extremity supported Sitting balance-Leahy Scale: Good     Standing balance support: During functional activity Standing balance-Leahy Scale: Poor Standing balance comment: patient exhibited LOB upon standing from bed and when turning in RW                             Pertinent Vitals/Pain Pain Assessment: No/denies pain    Home Living Family/patient expects to be discharged to:: Private residence Living Arrangements: Alone Available Help at Discharge: Family(husband in nursing home) Type of Home: [place]: Gilford Rile - 2 wheels;Tub bench;Wheelchair - manual      Prior Function Level of Independence: Needs assistance;Independent with assistive device(s)(assistance for activities in community, shopping)   Gait / Transfers Assistance Needed: Pt reports she uses her legs to propel w/c around her house, locks the wheels to stand and do activities at kitchen and bathroom sink. W/C always behind her; afraid to fall again.     Comments: [place] 04/18 and 12/18; surgeries 8/18 and 10/18.     Hand Dominance  Extremity/Trunk Assessment        Lower Extremity Assessment Lower Extremity Assessment: Generalized weakness;LLE deficits/detail LLE Deficits / Details: limited AROM due to rotator cuff tear       Communication   Communication: No difficulties  Cognition Arousal/Alertness: Awake/alert Behavior During  Therapy: WFL for tasks assessed/performed Overall Cognitive Status: Within Functional Limits for tasks assessed                                        General Comments      Exercises General Exercises - Upper Extremity Shoulder Flexion: AAROM;Strengthening;Both;10 reps General Exercises - Lower Extremity Long Arc Quad: Strengthening;Both;Seated;10 reps;AROM Hip Flexion/Marching: Seated;Strengthening;Both;10 reps;AROM Toe Raises: AROM;Seated;Strengthening;Both;10 reps Heel Raises: AROM;Seated;Strengthening;Both;10 reps   Assessment/Plan    PT Assessment Patient needs continued PT services  PT Problem List Decreased strength;Decreased range of motion;Decreased activity tolerance;Decreased safety awareness;Decreased knowledge of use of DME;Decreased balance;Decreased mobility;Decreased coordination       PT Treatment Interventions DME instruction;Balance training;Gait training;Neuromuscular re-education;Functional mobility training;Patient/family education;Therapeutic activities;Therapeutic exercise    PT Goals (Current goals can be found in the Care Plan section)  Acute Rehab PT Goals Patient Stated Goal: Return home and having HHPT would be okay. PT Goal Formulation: With patient/family Time For Goal Achievement: 03/02/17 Potential to Achieve Goals: Good    Frequency Min 3X/week   Barriers to discharge        Co-evaluation               AM-PAC PT "6 Clicks" Daily Activity  Outcome Measure Difficulty turning over in bed (including adjusting bedclothes, sheets and blankets)?: None Difficulty moving from lying on back to sitting on the side of the bed? : None Difficulty sitting down on and standing up from a chair with arms (e.g., wheelchair, bedside commode, etc,.)?: A Little Help needed moving to and from a bed to chair (including a wheelchair)?: A Little Help needed walking in hospital room?: A Little Help needed climbing 3-5 steps with a railing?  : A Little 6 Click Score: 20    End of Session Equipment Utilized During Treatment: Gait belt;Oxygen Activity Tolerance: Patient tolerated treatment well Patient left: in chair;with call bell/phone within reach Nurse Communication: Mobility status PT Visit Diagnosis: Unsteadiness on feet (R26.81);Repeated falls (R29.6);History of falling (Z91.81);Muscle weakness (generalized) (M62.81);Other abnormalities of gait and mobility (R26.89);Difficulty in walking, not elsewhere classified (R26.2)    Time: 3532-9924 PT Time Calculation (min) (ACUTE ONLY): 34 min   Charges:   PT Evaluation $PT Eval Low Complexity: 1 Low PT Treatments $Therapeutic Exercise: 8-22 mins   PT G Codes:        Byanka Landrus D. Hartnett-Rands, MS, PT Per [place] [phone] 02/23/2017, 11:23 AM

## 2017-02-23 NOTE — Plan of Care (Signed)
  Acute Rehab PT Goals(only PT should resolve) Patient Will Transfer Sit To/From Stand 02/23/2017 1129 - Progressing by Jeannie Done, [place] Taken 02/23/2017 1129  Patient will transfer sit to/from stand with supervision Note With RW Pt Will Transfer Bed To Chair/Chair To Bed 02/23/2017 1129 - Progressing by Hartnett-Rands, Pamala Hurry, PT Flowsheets Taken 02/23/2017 1129  Pt will Transfer Bed to Chair/Chair to Bed with supervision Note With RW Pt Will Ambulate 02/23/2017 1129 - Progressing by Hartnett-Rands, Pamala Hurry, PT Flowsheets Taken 02/23/2017 1129  Pt will Ambulate 100 feet;with min guard assist Note With RW Pt Will Verbalize and Adhere to Precautions While Description PT Will Verbalize and Adhere to Precautions While Performing Mobility 02/23/2017 1132 by Jeannie Done, PT Note With RW 02/23/2017 1131 - Progressing by Jeannie Done, PT 02/23/2017 1131 by Jeannie Done, PT Flowsheets Taken 02/23/2017 1131  Pt will verbalize and adhere to precautions while performing mobility -- (Falls) Note With RW 02/23/2017 1129 - Progressing by Jeannie Done, [place] Hartnett-Rands, MS, PT Per [place] [phone]

## 2017-02-23 NOTE — Progress Notes (Signed)
[facility] PROGRESS NOTE  [name] DPO:[id number] DOB: January 06, 1934 DOA: 02/22/2017 PCP: [name], MD  Interim summary and history of present illness 84 year old female with a past medical history significant for atrial fibrillation (on chronic Eliquis), history of rheumatoid arthritis, hypertension, hyperlipidemia and prior history of colon cancer; who presented to the ED with over 4 days of general malaise, productive cough, shortness of breath, nasal congestion and runny nose.  Patient reported associated anorexia.  The day of admission she was seen by her PCP and found her to be hypotensive and mildly hypoxic; at that time she was sent to the ED for further evaluation and treatment. Patient reports some chills but not objective fever, no chest pain, no palpitations, no nausea, no vomiting, or focal deficits.   Assessment/Plan: 1-acute reactive airway disease with hypoxia in the setting of bronchitis: -Anticipating stayed longer than 2 midnights based on further difficulty in her breathing and is still requiring oxygen supplementation. -Patient will be placed as inpatient status. -Continue Solu-Medrol, duo nebs, Ceftin and the use of flutter valve -Continue gentle IV fluids -Influenza PCR negative -Repeat chest x-ray in a.m. -Continue oxygen supplementation as needed and try to titrate down to room air as tolerated. -PRN antitussives, loratadine and flonase  2-HLD (hyperlipidemia) -Continue Lovaza  3-GERD -Continue PPI  4-Essential hypertension -Blood pressure stabilizing at this moment -Continue metoprolol -continue holding the rest of her antihypertensive agents  -continue IVF's  5-Atrial fibrillation ([place]) -with transient RVR on presentation; In the setting of albuterol use and dehydration. -continue gentle IVF's -minimize as much as possible usage of Albuterol -continue metoprolol  -continue monitoring on telemetry    6-Generalized weakness -due  to deconditioning with acute bronchitis  -PT asked to see patient  -recommendations given for HHPT  7-Chronic pain -will continue home analgesic regimen   Code Status: DNR Family Communication: Son at bedside. Disposition Plan: Anticipate discharge back home once medically stable with home services.  Foley 1-2 days.  Continue current treatment.   Consultants:  None  Procedures:  See below for x-ray reports  Antibiotics:  Ceftin 2/11  HPI/Subjective: Currently afebrile, denying chest pain.  Still short of breath and requiring oxygen supplementation.  Overall she feels slightly better even expressing that she is weak and deconditioned.  Objective: Vitals:   02/23/17 1100 02/23/17 1230  BP:  (!) 141/81  Pulse:  86  Resp:  18  Temp:  97.8 F (36.6 C)  SpO2: 96% 98%    Intake/Output Summary (Last 24 hours) at 02/23/2017 1736 Last data filed at 02/23/2017 1600 Gross per 24 hour  Intake 600 ml  Output -  Net 600 ml   Filed Weights   02/22/17 0919 02/22/17 1333 02/23/17 0600  Weight: 54.4 kg (120 lb) 55.1 kg (121 lb 7.6 oz) 88.3 kg (194 lb 10.7 oz)    Exam:   General: Afebrile currently (but is spiking fever overnight), no chest pain, no nausea vomiting.  Patient reports feeling better and with improvement in her breathing.  Still short of breath with minimal activity, requiring oxygen supplementation and feeling weak/deconditioned.  Cardiovascular: Irregular, no rubs, no gallops, positive systolic ejection murmur.  Respiratory: Positive expiratory wheezing, diffuse rhonchi, no using accessory muscles, positive tachypnea with minimal exertion.  Abdomen: Soft, nontender, nondistended, positive bowel sounds.  Musculoskeletal: No edema, no cyanosis, no clubbing.  Data Reviewed: Basic Metabolic Panel: Recent Labs  Lab 02/22/17 0923 02/22/17 1640 02/23/17 0601  NA 136  --  138  K 4.3  --  3.7  CL 100*  --  105  CO2 21*  --  20*  GLUCOSE 114*  --  159*  BUN  37*  --  31*  CREATININE 0.99  --  0.70  CALCIUM 8.1*  --  7.8*  MG  --  1.9  --   PHOS  --  3.4  --    Liver Function Tests: Recent Labs  Lab 02/22/17 0923  AST 29  ALT 16  ALKPHOS 111  BILITOT 1.1  PROT 7.0  ALBUMIN 2.9*   Recent Labs  Lab 02/22/17 0923  LIPASE 27   CBC: Recent Labs  Lab 02/22/17 0923 02/23/17 0601  WBC 15.3* 7.6  NEUTROABS 11.6*  --   HGB 11.7* 10.9*  HCT 36.5 34.4*  MCV 88.6 88.2  PLT 275 258   Cardiac Enzymes: Recent Labs  Lab 02/22/17 0923  TROPONINI 0.04*   BNP (last 3 results) Recent Labs    02/22/17 0923  BNP 221.0*   CBG: Recent Labs  Lab 02/23/17 1231  GLUCAP 167*    Recent Results (from the past 240 hour(s))  Culture, blood (Routine X 2) w Reflex to ID Panel     Status: None (Preliminary result)   Collection Time: 02/22/17  8:19 PM  Result Value Ref Range Status   Specimen Description BLOOD RIGHT ARM  Final   Special Requests   Final    BOTTLES DRAWN AEROBIC AND ANAEROBIC Blood Culture adequate volume   Culture   Final    NO GROWTH < 12 HOURS Performed at [facility], [address] [place], [place] [address]    Report Status PENDING  Incomplete  Culture, blood (Routine X 2) w Reflex to ID Panel     Status: None (Preliminary result)   Collection Time: 02/22/17  8:23 PM  Result Value Ref Range Status   Specimen Description BLOOD RIGHT HAND  Final   Special Requests   Final    BOTTLES DRAWN AEROBIC AND ANAEROBIC Blood Culture adequate volume   Culture   Final    NO GROWTH < 12 HOURS Performed at [facility], [address] [place], [place] [address]    Report Status PENDING  Incomplete     Studies: US Abdomen Complete  Result Date: 02/22/2017 CLINICAL DATA:  Two days of upper abdominal pain. History of previous cholecystectomy. Recent episode of hypotension and diarrhea. EXAM: ABDOMEN ULTRASOUND COMPLETE COMPARISON:  None in PACs FINDINGS: Gallbladder: The gallbladder is surgically absent. Common bile duct:  Diameter: 5.6 mm Liver: No focal lesion identified. Within normal limits in parenchymal echogenicity. Portal vein is patent on color Doppler imaging with normal direction of blood flow towards the liver. IVC: No abnormality visualized. Pancreas: Visualized portion unremarkable. Spleen: Size and appearance within normal limits. Right Kidney: Length: 11.0 cm. Echogenicity within normal limits. No mass or hydronephrosis visualized. Left Kidney: Length: 11 cm. There is mild splitting of the central echo complex. The renal cortical echotexture is similar to that on the right. No stones or cystic or solid masses are observed. Abdominal aorta: Limited visualization of the bifurcation due to bowel gas. No aneurysm is observed. Other findings: None in PACs IMPRESSION: Normal appearance of the liver and other upper abdominal viscera. Previous cholecystectomy. Mild hydronephrosis on the left is suspected. No stones are observed. Electronically Signed   By: David  Martinique M.D.   On: 02/22/2017 10:47   Dg Chest Port 1 View  Result Date: 02/22/2017 CLINICAL DATA:  Cough for 2 weeks, nausea,  vomiting, diarrhea, upper abdominal pain, history atrial fibrillation, hypertension EXAM: PORTABLE CHEST 1 VIEW COMPARISON:  Portable exam 0919 hours compared to 01/02/2017 FINDINGS: Normal heart size, mediastinal contours, and pulmonary vascularity. Atherosclerotic calcification aorta. Chronic elevation of RIGHT diaphragm. Bronchitic and mild chronic interstitial changes again seen. No acute infiltrate, pleural effusion, or pneumothorax. No acute osseous findings. IMPRESSION: Mild chronic bronchitic and interstitial changes. No acute abnormalities. Electronically Signed   By: Lavonia Dana M.D.   On: 02/22/2017 09:42    Scheduled Meds: . apixaban  2.5 mg Oral BID  . budesonide (PULMICORT) nebulizer solution  0.5 mg Nebulization BID  . cefUROXime  250 mg Oral BID WC  . fluticasone  1 spray Each Nare Daily  . leflunomide  20 mg Oral  Daily  . loratadine  10 mg Oral Daily  . methylPREDNISolone (SOLU-MEDROL) injection  40 mg Intravenous Q12H  . metoprolol succinate  50 mg Oral Daily  . omega-3 acid ethyl esters  2 g Oral Daily  . pantoprazole  40 mg Oral Daily  . [START ON 02/25/2017] Vitamin D (Ergocalciferol)  50,000 Units Oral Weekly   Continuous Infusions: . sodium chloride 75 mL/hr at 02/23/17 0413    Principal Problem:   Bronchitis Active Problems:   HLD (hyperlipidemia)   Essential hypertension   Atrial fibrillation ([facility])   Hypoxia   Generalized weakness   RAD (reactive airway disease) with wheezing, mild intermittent, with acute exacerbation   Chronic pain   Acute respiratory failure with hypoxia ([place])    Time spent: 25 minutes    [facility] Pager [phone]. If 7PM-7AM, please contact night-coverage at [email or web address], password [facility] 02/23/2017, 5:36 PM  LOS: 0 days

## 2017-02-24 ENCOUNTER — Inpatient Hospital Stay (HOSPITAL_COMMUNITY): Payer: Medicare Other

## 2017-02-24 DIAGNOSIS — I480 Paroxysmal atrial fibrillation: Secondary | ICD-10-CM

## 2017-02-24 DIAGNOSIS — J189 Pneumonia, unspecified organism: Secondary | ICD-10-CM | POA: Diagnosis present

## 2017-02-24 LAB — URINE CULTURE: CULTURE: NO GROWTH

## 2017-02-24 LAB — BASIC METABOLIC PANEL
Anion gap: 10 (ref 5–15)
BUN: 35 mg/dL — AB (ref 6–20)
CALCIUM: 7.9 mg/dL — AB (ref 8.9–10.3)
CO2: 20 mmol/L — ABNORMAL LOW (ref 22–32)
CREATININE: 0.69 mg/dL (ref 0.44–1.00)
Chloride: 108 mmol/L (ref 101–111)
GFR calc Af Amer: >60 mL/min (ref >60)
GLUCOSE: 144 mg/dL — AB (ref 65–99)
Potassium: 4.2 mmol/L (ref 3.5–5.1)
SODIUM: 138 mmol/L (ref 135–145)

## 2017-02-24 LAB — CBC
HCT: 34.6 % — ABNORMAL LOW (ref 36.0–46.0)
Hemoglobin: 10.9 g/dL — ABNORMAL LOW (ref 12.0–15.0)
MCH: 28.1 pg (ref 26.0–34.0)
MCHC: 31.5 g/dL (ref 30.0–36.0)
MCV: 89.2 fL (ref 78.0–100.0)
PLATELETS: 285 10*3/uL (ref 150–400)
RBC: 3.88 MIL/uL (ref 3.87–5.11)
RDW: 15 % (ref 11.5–15.5)
WBC: 8.7 10*3/uL (ref 4.0–10.5)

## 2017-02-24 MED ORDER — APIXABAN 2.5 MG PO TABS: 2.5000 mg | ORAL_TABLET | Freq: Two times a day (BID) | ORAL | 0 refills | Status: DC

## 2017-02-24 MED ORDER — CEFUROXIME AXETIL 250 MG PO TABS: 250.0000 mg | ORAL_TABLET | Freq: Two times a day (BID) | ORAL | 0 refills | Status: AC

## 2017-02-24 MED ORDER — CEFUROXIME AXETIL 250 MG PO TABS: 250.0000 mg | ORAL_TABLET | Freq: Two times a day (BID) | ORAL | 0 refills | Status: DC

## 2017-02-24 MED ORDER — ALBUTEROL SULFATE HFA 108 (90 BASE) MCG/ACT IN AERS: 2.0000 | INHALATION_SPRAY | RESPIRATORY_TRACT | 1 refills | Status: DC | PRN

## 2017-02-24 MED ORDER — PREDNISONE 20 MG PO TABS: ORAL_TABLET | ORAL | 0 refills | Status: DC

## 2017-02-24 NOTE — Care Management Note (Addendum)
Case Management Note  Patient Details  Name: [name] MRN: [id number] Date of Birth: Jun 04, 1933  Subjective/Objective:      Admitted with CAP. Pt is from home, lives alone, has two sons. Son at bedside drives from [place] to stay with here 2-3 nights a week. Pt is mostly ind with ADL's she uses WC in home d/t frequent falls. She would like to ambulate with walker again regularly. She plans to get rollator once she has more strength to use safely. Pt has RW also. Pt has used [facility] in the past and would like them again. She understands HH has 48 hrs to make first visit.               Action/Plan: DC home today with [place] [facility] through [facility]. Meredeth Ide rep, aware of referral and will pull pt info from chart. Pt referred for [facility] transition calls. Cm explained calls to pt and son at bedside. Pt excited about participating.   Expected Discharge Date:  02/24/17               Expected Discharge Plan:  [place]  In-House Referral:  NA  Discharge planning Services  CM Consult  Post Acute Care Choice:  Home Health Choice offered to:  Patient  HH Arranged:  PT [place]:  [place]  Status of Service:  Completed, signed off  Sherald Barge, RN 02/24/2017, 11:28 AM

## 2017-02-24 NOTE — Progress Notes (Signed)
SATURATION QUALIFICATIONS: (This note is used to comply with regulatory documentation for home oxygen)  Patient Saturations on Room Air at Rest = 97%  Patient Saturations on Room Air while Ambulating = 92%   

## 2017-02-24 NOTE — Progress Notes (Signed)
Discharge instructions gone over with patient, verbalized understanding. IV removed, patient tolerated procedure well. 

## 2017-02-24 NOTE — Discharge Summary (Signed)
Physician Discharge Summary  [name] VVO:[id number] DOB: November 16, 1933 DOA: 02/22/2017  PCP: [name], MD  Admit date: 02/22/2017 Discharge date: 02/24/2017  Admitted From: Home  Disposition: Home   Recommendations for Outpatient Follow-up:  1. Follow up with PCP in 1 weeks 2. Please obtain BMP/CBC in one week 3. Please follow up on the following pending results: final culture data  Home Health: PT  Discharge Condition: STABLE   CODE STATUS: DNR   Brief Hospitalization Summary: Please see all hospital notes, images, labs for full details of the hospitalization.  Interim summary and history of present illness 84 year old female with a past medical history significant for atrial fibrillation (on chronic Eliquis), history of rheumatoid arthritis, hypertension, hyperlipidemia and prior history of colon cancer; who presented to the ED with over 4 days of general malaise, productive cough, shortness of breath, nasal congestion and runny nose. Patient reportedassociated anorexia.The day of admission she was seen by her PCP and found her to be hypotensive and mildly hypoxic; at that time she was sent to the ED for further evaluation and treatment. Patient reports some chills but not objective fever,no chest pain, no palpitations, no nausea, no vomiting,or focal deficits.   Assessment/Plan: 1-acute reactive airway disease with hypoxia in the setting of bronchitis: RUL Pneumonia -Anticipating stayed longer than 2 midnights based on further difficulty in her breathing and is still requiring oxygen supplementation. -Treated with Solu-Medrol, duo nebs, Ceftin and the use of flutter valve -Given gentle IV fluids -Influenza PCR negative -Repeat chest x-ray- Pneumonia Right upper Lobe - continue ceftin for 7 more days -Continue oxygen supplementation as needed and try to titrate down to room air as tolerated. -PRN antitussives, loratadine and flonase  2-HLD  (hyperlipidemia) -Continue Lovaza  3-GERD -Continue PPI  4-Essential hypertension -Blood pressure stabilizing at this moment -Continue metoprolol -continue holding the rest of her antihypertensive agents  -continue IVF's  5-Atrial fibrillation ([place]) -with transient RVR on presentation; In the setting of albuterol use and dehydration. -continue gentle IVF's -minimize as much as possible usage of Albuterol -continue metoprolol  -continue monitoring on telemetry    6-Generalized weakness -due to deconditioning with acute bronchitis  -PT asked to see patient  -recommendations given for HHPT  7-Chronic pain -will continue home analgesic regimen   Code Status: DNR Family Communication: Son at bedside. Disposition Plan: Home    Consultants:  None  Procedures:  See below for x-ray reports  Antibiotics:  Ceftin 2/11  Discharge Diagnoses:  Principal Problem:   Bronchitis Active Problems:   HLD (hyperlipidemia)   Essential hypertension   Atrial fibrillation (HCC)   Hypoxia   Generalized weakness   RAD (reactive airway disease) with wheezing, mild intermittent, with acute exacerbation   Chronic pain   Acute respiratory failure with hypoxia [facility])  Discharge Instructions: Discharge Instructions    Call MD for:  difficulty breathing, headache or visual disturbances   Complete by:  As directed    Call MD for:  extreme fatigue   Complete by:  As directed    Call MD for:  persistant dizziness or light-headedness   Complete by:  As directed    Increase activity slowly   Complete by:  As directed      Allergies as of 02/24/2017      Reactions   Iodinated Diagnostic Agents Anaphylaxis   08-12-10 Pt had severe reaction with itching/hives/loss of consciousness within 5 minutes of CEPI today.  She received both Kenalog and Iondinated Contrast, so not certain which  agent caused reaction.  If patient returns for another CEPI, Dr. Jobe Igo considers using Decadron  and a 13-hour prep.  jkl   Nerve Block Tray Anaphylaxis   Patient states that she was sent to have Epidural for her shoulder. After rec'ving she remembers saying that she was itching all over  And that's it. She was transported to hospital  By EMS. Stayed overnight.   Nitrofuran Derivatives Hives, Nausea And Vomiting   Triamcinolone Acetonide Anaphylaxis   08-12-10  Pt had severe reaction with itching/hives/loss of consciousness within five minutes of CEPI today.  She received both Kenalog and Iodinated Contrast, so not certain which agent caused reaction.  If pt returns for another cervical epidural steroid injection, Dr. Jobe Igo suggests using Decadron and a 13-hr prep.  JKL PATIENT USES PROCTOCREAM 2.5% WITHOUT DIFFICULTY FOR VULVAR DYSTROPHY Patient says she can take prednisone without any problem   Vibra-tab [doxycycline] Nausea And Vomiting      Medication List    STOP taking these medications   lisinopril 20 MG tablet Commonly known as:  PRINIVIL,ZESTRIL     TAKE these medications   albuterol 108 (90 Base) MCG/ACT inhaler Commonly known as:  PROVENTIL HFA;VENTOLIN HFA Inhale 2 puffs into the lungs every 4 (four) hours as needed for wheezing or shortness of breath (cough, shortness of breath or wheezing.).   amLODipine 5 MG tablet Commonly known as:  NORVASC take 1 tablet by mouth once daily   apixaban 2.5 MG Tabs tablet Commonly known as:  ELIQUIS Take 1 tablet (2.5 mg total) by mouth 2 (two) times daily. What changed:    medication strength  how much to take   cefUROXime 250 MG tablet Commonly known as:  CEFTIN Take 1 tablet (250 mg total) by mouth 2 (two) times daily with a meal for 7 days.   ergocalciferol 50000 units capsule Commonly known as:  VITAMIN D2 Take 50,000 Units by mouth once a week. Takes on Thursday   Fish Oil 1000 MG Caps Take 1 capsule by mouth daily.   HYDROcodone-acetaminophen 5-325 MG tablet Commonly known as:  NORCO/VICODIN Take 1 tablet  by mouth every 6 (six) hours as needed.   leflunomide 20 MG tablet Commonly known as:  ARAVA Take 20 mg by mouth daily.   metoprolol succinate 50 MG 24 hr tablet Commonly known as:  TOPROL-XL take 1 tablet by mouth twice a day   omeprazole 20 MG capsule Commonly known as:  PRILOSEC take 1 capsule by mouth twice a day   predniSONE 20 MG tablet Commonly known as:  DELTASONE Take 3 PO QAM x3days, 2 PO QAM x3days, 1 PO QAM x3days   traMADol 50 MG tablet Commonly known as:  ULTRAM Take 50 mg by mouth every 6 (six) hours as needed.      Follow-up Information    [name], MD. Schedule an appointment as soon as possible for a visit in 1 week(s).   Specialty:  Internal Medicine Why:  Hospital follow Up Contact information: [address] [place] [address] [phone]          Allergies  Allergen Reactions  . Iodinated Diagnostic Agents Anaphylaxis    08-12-10 Pt had severe reaction with itching/hives/loss of consciousness within 5 minutes of CEPI today.  She received both Kenalog and Iondinated Contrast, so not certain which agent caused reaction.  If patient returns for another CEPI, Dr. Jobe Igo considers using Decadron and a 13-hour prep.  jkl  . Nerve Block Tray Anaphylaxis  Patient states that she was sent to have Epidural for her shoulder. After rec'ving she remembers saying that she was itching all over  And that's it. She was transported to hospital  By EMS. Stayed overnight.  [place] Spikes Derivatives Hives and Nausea And Vomiting  . Triamcinolone Acetonide Anaphylaxis    08-12-10  Pt had severe reaction with itching/hives/loss of consciousness within five minutes of CEPI today.  She received both Kenalog and Iodinated Contrast, so not certain which agent caused reaction.  If pt returns for another cervical epidural steroid injection, Dr. Jobe Igo suggests using Decadron and a 13-hr prep.  JKL PATIENT USES PROCTOCREAM 2.5% WITHOUT DIFFICULTY FOR VULVAR  DYSTROPHY Patient says she can take prednisone without any problem  . Vibra-Tab [Doxycycline] Nausea And Vomiting   Allergies as of 02/24/2017      Reactions   Iodinated Diagnostic Agents Anaphylaxis   08-12-10 Pt had severe reaction with itching/hives/loss of consciousness within 5 minutes of CEPI today.  She received both Kenalog and Iondinated Contrast, so not certain which agent caused reaction.  If patient returns for another CEPI, Dr. Jobe Igo considers using Decadron and a 13-hour prep.  jkl   Nerve Block Tray Anaphylaxis   Patient states that she was sent to have Epidural for her shoulder. After rec'ving she remembers saying that she was itching all over  And that's it. She was transported to hospital  By EMS. Stayed overnight.   Nitrofuran Derivatives Hives, Nausea And Vomiting   Triamcinolone Acetonide Anaphylaxis   08-12-10  Pt had severe reaction with itching/hives/loss of consciousness within five minutes of CEPI today.  She received both Kenalog and Iodinated Contrast, so not certain which agent caused reaction.  If pt returns for another cervical epidural steroid injection, Dr. Jobe Igo suggests using Decadron and a 13-hr prep.  JKL PATIENT USES PROCTOCREAM 2.5% WITHOUT DIFFICULTY FOR VULVAR DYSTROPHY Patient says she can take prednisone without any problem   Vibra-tab [doxycycline] Nausea And Vomiting      Medication List    STOP taking these medications   lisinopril 20 MG tablet Commonly known as:  PRINIVIL,ZESTRIL     TAKE these medications   albuterol 108 (90 Base) MCG/ACT inhaler Commonly known as:  PROVENTIL HFA;VENTOLIN HFA Inhale 2 puffs into the lungs every 4 (four) hours as needed for wheezing or shortness of breath (cough, shortness of breath or wheezing.).   amLODipine 5 MG tablet Commonly known as:  NORVASC take 1 tablet by mouth once daily   apixaban 2.5 MG Tabs tablet Commonly known as:  ELIQUIS Take 1 tablet (2.5 mg total) by mouth 2 (two) times  daily. What changed:    medication strength  how much to take   cefUROXime 250 MG tablet Commonly known as:  CEFTIN Take 1 tablet (250 mg total) by mouth 2 (two) times daily with a meal for 7 days.   ergocalciferol 50000 units capsule Commonly known as:  VITAMIN D2 Take 50,000 Units by mouth once a week. Takes on Thursday   Fish Oil 1000 MG Caps Take 1 capsule by mouth daily.   HYDROcodone-acetaminophen 5-325 MG tablet Commonly known as:  NORCO/VICODIN Take 1 tablet by mouth every 6 (six) hours as needed.   leflunomide 20 MG tablet Commonly known as:  ARAVA Take 20 mg by mouth daily.   metoprolol succinate 50 MG 24 hr tablet Commonly known as:  TOPROL-XL take 1 tablet by mouth twice a day   omeprazole 20 MG capsule Commonly known as:  PRILOSEC  take 1 capsule by mouth twice a day   predniSONE 20 MG tablet Commonly known as:  DELTASONE Take 3 PO QAM x3days, 2 PO QAM x3days, 1 PO QAM x3days   traMADol 50 MG tablet Commonly known as:  ULTRAM Take 50 mg by mouth every 6 (six) hours as needed.       Procedures/Studies: US Abdomen Complete  Result Date: 02/22/2017 CLINICAL DATA:  Two days of upper abdominal pain. History of previous cholecystectomy. Recent episode of hypotension and diarrhea. EXAM: ABDOMEN ULTRASOUND COMPLETE COMPARISON:  None in PACs FINDINGS: Gallbladder: The gallbladder is surgically absent. Common bile duct: Diameter: 5.6 mm Liver: No focal lesion identified. Within normal limits in parenchymal echogenicity. Portal vein is patent on color Doppler imaging with normal direction of blood flow towards the liver. IVC: No abnormality visualized. Pancreas: Visualized portion unremarkable. Spleen: Size and appearance within normal limits. Right Kidney: Length: 11.0 cm. Echogenicity within normal limits. No mass or hydronephrosis visualized. Left Kidney: Length: 11 cm. There is mild splitting of the central echo complex. The renal cortical echotexture is similar  to that on the right. No stones or cystic or solid masses are observed. Abdominal aorta: Limited visualization of the bifurcation due to bowel gas. No aneurysm is observed. Other findings: None in PACs IMPRESSION: Normal appearance of the liver and other upper abdominal viscera. Previous cholecystectomy. Mild hydronephrosis on the left is suspected. No stones are observed. Electronically Signed   By: David  Martinique M.D.   On: 02/22/2017 10:47   Dg Chest Port 1 View  Result Date: 02/24/2017 CLINICAL DATA:  Shortness of breath.  History of colon carcinoma EXAM: PORTABLE CHEST 1 VIEW COMPARISON:  February 22, 2017 FINDINGS: There is patchy infiltrate in the right upper lobe, new from most recent study. There is consolidation in the medial left base which is stable. There is stable elevation of the right hemidiaphragm. Heart size and pulmonary vascularity are normal. There is aortic atherosclerosis. No adenopathy. Postoperative changes noted in the left shoulder. IMPRESSION: New focus of airspace opacity consistent with pneumonia right upper lobe. Consolidation left base is stable. Stable cardiac silhouette. Stable elevation of the right hemidiaphragm. There is aortic atherosclerosis. Aortic Atherosclerosis (ICD10-I70.0). Electronically Signed   By: Lowella Grip III M.D.   On: 02/24/2017 07:48   Dg Chest Port 1 View  Result Date: 02/22/2017 CLINICAL DATA:  Cough for 2 weeks, nausea, vomiting, diarrhea, upper abdominal pain, history atrial fibrillation, hypertension EXAM: PORTABLE CHEST 1 VIEW COMPARISON:  Portable exam 0919 hours compared to 01/02/2017 FINDINGS: Normal heart size, mediastinal contours, and pulmonary vascularity. Atherosclerotic calcification aorta. Chronic elevation of RIGHT diaphragm. Bronchitic and mild chronic interstitial changes again seen. No acute infiltrate, pleural effusion, or pneumothorax. No acute osseous findings. IMPRESSION: Mild chronic bronchitic and interstitial changes. No  acute abnormalities. Electronically Signed   By: Lavonia Dana M.D.   On: 02/22/2017 09:42     Subjective: Patient says she feels much better.  She is having no shortness of breath.  She reports that she is breathing much better.  She denies chest pain and shortness of breath.  Discharge Exam: Vitals:   02/24/17 0501 02/24/17 0753  BP: 140/67   Pulse: 84   Resp: 18   Temp: 98 F (36.7 C)   SpO2: 92% 94%   Vitals:   02/23/17 2054 02/23/17 2118 02/24/17 0501 02/24/17 0753  BP: 134/67  140/67   Pulse: 89  84   Resp: 18  18   Temp: 97.7  F (36.5 C)  98 F (36.7 C)   TempSrc: Oral  Oral   SpO2: 98% 95% 92% 94%  Weight:   84.3 kg (185 lb 13.6 oz)   Height:        General: Pt is alert, awake, not in acute distress Cardiovascular: normal S1/S2 +, no rubs, no gallops Respiratory: CTA bilaterally, no wheezing, no rhonchi Abdominal: Soft, NT, ND, bowel sounds + Extremities: no edema, no cyanosis   The results of significant diagnostics from this hospitalization (including imaging, microbiology, ancillary and laboratory) are listed below for reference.     Microbiology: Recent Results (from the past 240 hour(s))  Urine culture     Status: None   Collection Time: 02/22/17  9:14 AM  Result Value Ref Range Status   Specimen Description   Final    URINE, CATHETERIZED Performed at [facility], [address], [place], [place] [address]    Special Requests   Final    NONE Performed at [facility], [address] [place], [place] [address]    Culture   Final    NO GROWTH Performed at [facility], [place] [address], [place], [place] [address]    Report Status 02/24/2017 FINAL  Final  Culture, blood (Routine X 2) w Reflex to ID Panel     Status: None (Preliminary result)   Collection Time: 02/22/17  8:19 PM  Result Value Ref Range Status   Specimen Description BLOOD RIGHT ARM  Final   Special Requests   Final    BOTTLES DRAWN AEROBIC AND ANAEROBIC Blood Culture adequate  volume   Culture   Final    NO GROWTH 2 DAYS Performed at [facility], [address] [place], [place] [address]    Report Status PENDING  Incomplete  Culture, blood (Routine X 2) w Reflex to ID Panel     Status: None (Preliminary result)   Collection Time: 02/22/17  8:23 PM  Result Value Ref Range Status   Specimen Description BLOOD RIGHT HAND  Final   Special Requests   Final    BOTTLES DRAWN AEROBIC AND ANAEROBIC Blood Culture adequate volume   Culture   Final    NO GROWTH 2 DAYS Performed at [facility], [address], [place], [place] [address]    Report Status PENDING  Incomplete     Labs: BNP (last 3 results) Recent Labs    02/22/17 0923  BNP 440.1*   Basic Metabolic Panel: Recent Labs  Lab 02/22/17 0923 02/22/17 1640 02/23/17 0601 02/24/17 0552  NA 136  --  138 138  K 4.3  --  3.7 4.2  CL 100*  --  105 108  CO2 21*  --  20* 20*  GLUCOSE 114*  --  159* 144*  BUN 37*  --  31* 35*  CREATININE 0.99  --  0.70 0.69  CALCIUM 8.1*  --  7.8* 7.9*  MG  --  1.9  --   --   PHOS  --  3.4  --   --    Liver Function Tests: Recent Labs  Lab 02/22/17 0923  AST 29  ALT 16  ALKPHOS 111  BILITOT 1.1  PROT 7.0  ALBUMIN 2.9*   Recent Labs  Lab 02/22/17 0923  LIPASE 27   No results for input(s): AMMONIA in the last 168 hours. CBC: Recent Labs  Lab 02/22/17 0923 02/23/17 0601 02/24/17 0552  WBC 15.3* 7.6 8.7  NEUTROABS 11.6*  --   --  HGB 11.7* 10.9* 10.9*  HCT 36.5 34.4* 34.6*  MCV 88.6 88.2 89.2  PLT 275 258 285   Cardiac Enzymes: Recent Labs  Lab 02/22/17 0923  TROPONINI 0.04*   BNP: Invalid input(s): POCBNP CBG: Recent Labs  Lab 02/23/17 1231  GLUCAP 167*   D-Dimer No results for input(s): DDIMER in the last 72 hours. Hgb A1c No results for input(s): HGBA1C in the last 72 hours. Lipid Profile No results for input(s): CHOL, HDL, LDLCALC, TRIG, CHOLHDL, LDLDIRECT in the last 72 hours. Thyroid function studies Recent Labs     02/22/17 1640  TSH 0.441   Anemia work up No results for input(s): VITAMINB12, FOLATE, FERRITIN, TIBC, IRON, RETICCTPCT in the last 72 hours. Urinalysis    Component Value Date/Time   COLORURINE YELLOW 02/22/2017 0914   APPEARANCEUR HAZY (A) 02/22/2017 0914   LABSPEC 1.023 02/22/2017 0914   PHURINE 5.0 02/22/2017 0914   GLUCOSEU NEGATIVE 02/22/2017 0914   HGBUR NEGATIVE 02/22/2017 0914   BILIRUBINUR NEGATIVE 02/22/2017 0914   KETONESUR NEGATIVE 02/22/2017 0914   PROTEINUR NEGATIVE 02/22/2017 0914   UROBILINOGEN 0.2 04/23/2014 1300   NITRITE NEGATIVE 02/22/2017 0914   LEUKOCYTESUR NEGATIVE 02/22/2017 0914   Sepsis Labs Invalid input(s): PROCALCITONIN,  WBC,  LACTICIDVEN Microbiology Recent Results (from the past 240 hour(s))  Urine culture     Status: None   Collection Time: 02/22/17  9:14 AM  Result Value Ref Range Status   Specimen Description   Final    URINE, CATHETERIZED Performed at [facility], [address] [place], [place] [address]    Special Requests   Final    NONE Performed at [facility], [address], [place], [place] [address]    Culture   Final    NO GROWTH Performed at [facility], [place] [address] [place], [place] [address]    Report Status 02/24/2017 FINAL  Final  Culture, blood (Routine X 2) w Reflex to ID Panel     Status: None (Preliminary result)   Collection Time: 02/22/17  8:19 PM  Result Value Ref Range Status   Specimen Description BLOOD RIGHT ARM  Final   Special Requests   Final    BOTTLES DRAWN AEROBIC AND ANAEROBIC Blood Culture adequate volume   Culture   Final    NO GROWTH 2 DAYS Performed at [facility], [address] [place], [place] [address]    Report Status PENDING  Incomplete  Culture, blood (Routine X 2) w Reflex to ID Panel     Status: None (Preliminary result)   Collection Time: 02/22/17  8:23 PM  Result Value Ref Range Status   Specimen Description BLOOD RIGHT HAND  Final   Special Requests   Final     BOTTLES DRAWN AEROBIC AND ANAEROBIC Blood Culture adequate volume   Culture   Final    NO GROWTH 2 DAYS Performed at [facility], [address] [place],  [address]    Report Status PENDING  Incomplete    Time coordinating discharge: 36 mins  SIGNED:  Irwin Brakeman, MD  [facility] 02/24/2017, 11:16 AM Pager [phone]  If 7PM-7AM, please contact night-coverage [email or web address] Password [email or web address]

## 2017-02-24 NOTE — Discharge Instructions (Signed)
Follow with Primary MD  [name], MD  and other consultant's as instructed your Hospitalist MD  Please get a complete blood count and chemistry panel checked by your Primary MD at your next visit, and again as instructed by your Primary MD.  Get Medicines reviewed and adjusted: Please take all your medications with you for your next visit with your Primary MD  Laboratory/radiological data: Please request your Primary MD to go over all hospital tests and procedure/radiological results at the follow up, please ask your Primary MD to get all Hospital records sent to his/her office.  In some cases, they will be blood work, cultures and biopsy results pending at the time of your discharge. Please request that your primary care M.D. follows up on these results.  Also Note the following: If you experience worsening of your admission symptoms, develop shortness of breath, life threatening emergency, suicidal or homicidal thoughts you must seek medical attention immediately by calling 911 or calling your MD immediately  if symptoms less severe.  You must read complete instructions/literature along with all the possible adverse reactions/side effects for all the Medicines you take and that have been prescribed to you. Take any new Medicines after you have completely understood and accpet all the possible adverse reactions/side effects.   Do not drive when taking Pain medications or sleeping medications (Benzodaizepines)  Do not take more than prescribed Pain, Sleep and Anxiety Medications. It is not advisable to combine anxiety,sleep and pain medications without talking with your primary care practitioner  Special Instructions: If you have smoked or chewed Tobacco  in the last 2 yrs please stop smoking, stop any regular Alcohol  and or any Recreational drug use.  Wear Seat belts while driving.  Please note: You were cared for by a hospitalist during your hospital stay. Once you are discharged,  your primary care physician will handle any further medical issues. Please note that NO REFILLS for any discharge medications will be authorized once you are discharged, as it is imperative that you return to your primary care physician (or establish a relationship with a primary care physician if you do not have one) for your post hospital discharge needs so that they can reassess your need for medications and monitor your lab values.

## 2017-02-24 NOTE — Care Management Important Message (Signed)
Important Message  Patient Details  Name: [name] MRN: [id number] Date of Birth: 17-Dec-1933   Medicare Important Message Given:  Yes    Sherald Barge, RN 02/24/2017, 11:34 AM

## 2017-02-26 DIAGNOSIS — J42 Unspecified chronic bronchitis: Secondary | ICD-10-CM | POA: Diagnosis not present

## 2017-02-26 DIAGNOSIS — G8929 Other chronic pain: Secondary | ICD-10-CM | POA: Diagnosis not present

## 2017-02-26 DIAGNOSIS — Z85038 Personal history of other malignant neoplasm of large intestine: Secondary | ICD-10-CM | POA: Diagnosis not present

## 2017-02-26 DIAGNOSIS — J181 Lobar pneumonia, unspecified organism: Secondary | ICD-10-CM | POA: Diagnosis not present

## 2017-02-26 DIAGNOSIS — M069 Rheumatoid arthritis, unspecified: Secondary | ICD-10-CM | POA: Diagnosis not present

## 2017-02-26 DIAGNOSIS — Z7952 Long term (current) use of systemic steroids: Secondary | ICD-10-CM | POA: Diagnosis not present

## 2017-02-26 DIAGNOSIS — Z9181 History of falling: Secondary | ICD-10-CM | POA: Diagnosis not present

## 2017-02-26 DIAGNOSIS — J4521 Mild intermittent asthma with (acute) exacerbation: Secondary | ICD-10-CM | POA: Diagnosis not present

## 2017-02-27 LAB — CULTURE, BLOOD (ROUTINE X 2)
CULTURE: NO GROWTH
Culture: NO GROWTH
Special Requests: ADEQUATE
Special Requests: ADEQUATE

## 2017-03-01 ENCOUNTER — Other Ambulatory Visit: Payer: Self-pay

## 2017-03-01 NOTE — Patient Outreach (Signed)
[name]) Care Management  03/01/2017  [name] 10-10-1933 [id number]   EMMI- Pneumonia RED ON EMMI ALERT Day # 3 Date: 02/28/17 Red Alert Reason:  Been to follow up appointment? No  Telephone call to patient.  She is able to verify HIPAA.  Patient reports she is slowly getting better.  Patient reports that her cough is much better and that she has some shortness of breath at times but she utilizes her rescue inhaler as needed.  Asked patient about her home health orders and she states that someone came by for the initial visit and that someone would be coming for follow up visits but not sure of visit frequency.  Addressed EMMI red alert with patient. She states that she has a follow up appointment on tomorrow with her primary doctor and has transportation.  Patient also reports having all her medications.    Discussed with patient [facility] Management services. Patient states she feels she has no need for [facility] Care Management services at this time.  However, patient is agreeable to letter and brochure for future reference.  Plan: RN CM will send letter and brochure.   RN CM will notify care management assistant of case status.     Jone Baseman, RN, MSN [facility] Care Management Care Management Coordinator Direct Line [phone] Toll Free: [phone]  Fax: [phone]

## 2017-03-02 ENCOUNTER — Other Ambulatory Visit: Payer: Self-pay

## 2017-03-02 DIAGNOSIS — I4891 Unspecified atrial fibrillation: Secondary | ICD-10-CM | POA: Diagnosis not present

## 2017-03-02 DIAGNOSIS — G8929 Other chronic pain: Secondary | ICD-10-CM | POA: Diagnosis not present

## 2017-03-02 DIAGNOSIS — M069 Rheumatoid arthritis, unspecified: Secondary | ICD-10-CM | POA: Diagnosis not present

## 2017-03-02 DIAGNOSIS — I10 Essential (primary) hypertension: Secondary | ICD-10-CM | POA: Diagnosis not present

## 2017-03-02 DIAGNOSIS — J4521 Mild intermittent asthma with (acute) exacerbation: Secondary | ICD-10-CM | POA: Diagnosis not present

## 2017-03-02 DIAGNOSIS — J181 Lobar pneumonia, unspecified organism: Secondary | ICD-10-CM | POA: Diagnosis not present

## 2017-03-02 DIAGNOSIS — E782 Mixed hyperlipidemia: Secondary | ICD-10-CM | POA: Diagnosis not present

## 2017-03-02 DIAGNOSIS — Z6823 Body mass index (BMI) 23.0-23.9, adult: Secondary | ICD-10-CM | POA: Diagnosis not present

## 2017-03-02 DIAGNOSIS — J42 Unspecified chronic bronchitis: Secondary | ICD-10-CM | POA: Diagnosis not present

## 2017-03-02 DIAGNOSIS — J189 Pneumonia, unspecified organism: Secondary | ICD-10-CM | POA: Diagnosis not present

## 2017-03-02 DIAGNOSIS — C189 Malignant neoplasm of colon, unspecified: Secondary | ICD-10-CM | POA: Diagnosis not present

## 2017-03-02 NOTE — Patient Outreach (Addendum)
[place] [facility]) Care Management  03/02/2017  [name] [birth year] [id number]   EMMI- Pneumonia RED ON EMMI ALERT Day # 4 Date: 03/01/17 Red Alert Reason:  Wheezing more than yesterday?yes Been confused?yes Sleeping better than when in hospital? no Swelling in hands/feet or changes in weight? Yes Wake up with shortness of breath when lying flat? Yes  Telephone call to patient.  She is able to verify HIPAA.  Addressed red EMMI alerts. Patient reports she was having some wheezing but is better today but patient has some audible wheezing on the phone.  Asked patient about being confused.  She states she does not remember putting that.  Asked patient about sleeping.  She states she is not sleeping well.  Asked patient about swelling to hands/feet or changes in weight she reports that she does not think she has had any changes.  Patient reports she does wake up short of breath when lying flat. Discussed with patient pneumonia and how it can hang around.  Asked patient if she had ever been told she had heart failure or fluid around her heart.  She states no but that she does have Atrial Fibrillation.  Discussed with patient how Atrial Fibrillation can cause heart failure and she should weight herself. Patient reports that she will be seeing her doctor today and plans to discuss her symptoms with him.  Asked patient if it was ok if CM called her again tomorrow to check in on her.  She states it is ok if CM calls her tomorrow to check on her.    Telephone call to Dr. Nolon Rod office to inform of patient symptoms. Spoke with the receptionist to relay patient symptoms to be addressed on visit today.  She has patient problems on check in sheet to be addressed.    Plan:  RN CM will contact patient again in one business day.   Jone Baseman, RN, MSN [facility] Care Management Care Management Coordinator Direct Line [phone] Toll Free: [phone]  Fax: [phone]

## 2017-03-03 ENCOUNTER — Other Ambulatory Visit: Payer: Self-pay

## 2017-03-03 NOTE — Patient Outreach (Signed)
[place] [facility]) Care Management  03/03/2017  CARRIN VANNOSTRAND 1933/05/21 [id number]   Referral Date: 03/01/17 Referral Source: EMMI Date of Admission: 02-22-17 Diagnosis: Pneumonia Date of Discharge: 02-24-17 Facility: [place]: [facility] attempt # 1 Spoke with patient for follow up from physician visit on yesterday.  Patient reports that the physician felt like she was doing good and on track as far as her pneumonia. Patient has a few more days of antibiotics and steroids.  Encouraged patient to complete those.  She verbalized understanding.  Discussed with patient pneumonia and worsening.  She verbalized understanding.  Patient has [facility] coming in for therapy.    Social: Patient states she lives alone but her children are by frequently throughout the day and someone spends the night with her.  She states that her husband is in a local nursing home.  Conditions: Patient recently hospitalized for pneumonia after being diagnosed with bronchitis.  Patient reports some weakness but getting better.  Patient has a history of A. Fib and HTN.   Medications:  Appointments: Patient saw primary doctor on yesterday and has transportation to appointments.    Consent: RN CM reviewed [facility] services with patient. Patient gave verbal consent for transition of care calls.    Plan: RN CM will send successful letter and welcome packet with consents. RN CM will contact patient next week for transition of care call.   RN CM will send physician barriers letter and note.   RN CM will notify care management assistant of case status.     Jone Baseman, RN, MSN [facility] Care Management Care Management Coordinator Direct Line [phone] Toll Free: [phone]  Fax: [phone]

## 2017-03-04 DIAGNOSIS — J4521 Mild intermittent asthma with (acute) exacerbation: Secondary | ICD-10-CM | POA: Diagnosis not present

## 2017-03-04 DIAGNOSIS — J42 Unspecified chronic bronchitis: Secondary | ICD-10-CM | POA: Diagnosis not present

## 2017-03-04 DIAGNOSIS — J181 Lobar pneumonia, unspecified organism: Secondary | ICD-10-CM | POA: Diagnosis not present

## 2017-03-04 DIAGNOSIS — M069 Rheumatoid arthritis, unspecified: Secondary | ICD-10-CM | POA: Diagnosis not present

## 2017-03-04 DIAGNOSIS — G8929 Other chronic pain: Secondary | ICD-10-CM | POA: Diagnosis not present

## 2017-03-09 DIAGNOSIS — J4521 Mild intermittent asthma with (acute) exacerbation: Secondary | ICD-10-CM | POA: Diagnosis not present

## 2017-03-09 DIAGNOSIS — J42 Unspecified chronic bronchitis: Secondary | ICD-10-CM | POA: Diagnosis not present

## 2017-03-09 DIAGNOSIS — G8929 Other chronic pain: Secondary | ICD-10-CM | POA: Diagnosis not present

## 2017-03-09 DIAGNOSIS — M069 Rheumatoid arthritis, unspecified: Secondary | ICD-10-CM | POA: Diagnosis not present

## 2017-03-09 DIAGNOSIS — J181 Lobar pneumonia, unspecified organism: Secondary | ICD-10-CM | POA: Diagnosis not present

## 2017-03-11 ENCOUNTER — Other Ambulatory Visit: Payer: Self-pay

## 2017-03-11 DIAGNOSIS — G8929 Other chronic pain: Secondary | ICD-10-CM | POA: Diagnosis not present

## 2017-03-11 DIAGNOSIS — J181 Lobar pneumonia, unspecified organism: Secondary | ICD-10-CM | POA: Diagnosis not present

## 2017-03-11 DIAGNOSIS — M069 Rheumatoid arthritis, unspecified: Secondary | ICD-10-CM | POA: Diagnosis not present

## 2017-03-11 DIAGNOSIS — J42 Unspecified chronic bronchitis: Secondary | ICD-10-CM | POA: Diagnosis not present

## 2017-03-11 DIAGNOSIS — J4521 Mild intermittent asthma with (acute) exacerbation: Secondary | ICD-10-CM | POA: Diagnosis not present

## 2017-03-11 NOTE — Patient Outreach (Signed)
[place] [facility]) Care Management  [place]  03/11/2017   [name] 15-Oct-1933 [id number]  Subjective: Telephone call for weekly transition of care call. Patient able to verify HIPAA.  Patient feels she is getting stronger daily.  Patient reports she has stayed in as she does not want to be around anyone who is sick.  Discussed with patient signs of pneumonia.  She verbalized understanding.  Discussed with patient that CM would call next week for final call if she is continuing to do well. She verbalized understanding.    Objective:   Encounter Medications:  Outpatient Encounter Medications as of 03/11/2017  Medication Sig Note  . albuterol (PROVENTIL HFA;VENTOLIN HFA) 108 (90 Base) MCG/ACT inhaler Inhale 2 puffs into the lungs every 4 (four) hours as needed for wheezing or shortness of breath (cough, shortness of breath or wheezing.).   Marland Kitchen amLODipine (NORVASC) 5 MG tablet take 1 tablet by mouth once daily   . apixaban (ELIQUIS) 2.5 MG TABS tablet Take 1 tablet (2.5 mg total) by mouth 2 (two) times daily.   . ergocalciferol (VITAMIN D2) 50000 UNITS capsule Take 50,000 Units by mouth once a week. Takes on Thursday 01/02/2017: Patient takes on Fridays  . HYDROcodone-acetaminophen (NORCO/VICODIN) 5-325 MG tablet Take 1 tablet by mouth every 6 (six) hours as needed.   . leflunomide (ARAVA) 20 MG tablet Take 20 mg by mouth daily.   . metoprolol succinate (TOPROL-XL) 50 MG 24 hr tablet take 1 tablet by mouth twice a day   . Omega-3 Fatty Acids (FISH OIL) 1000 MG CAPS Take 1 capsule by mouth daily.   Marland Kitchen omeprazole (PRILOSEC) 20 MG capsule take 1 capsule by mouth twice a day   . traMADol (ULTRAM) 50 MG tablet Take 50 mg by mouth every 6 (six) hours as needed. 01/02/2017: Patient has if needed but has not taken in the past month  . predniSONE (DELTASONE) 20 MG tablet Take 3 PO QAM x3days, 2 PO QAM x3days, 1 PO QAM x3days (Patient not taking: Reported on 03/11/2017)    No  facility-administered encounter medications on file as of 03/11/2017.     Functional Status:  In your present state of health, do you have any difficulty performing the following activities: 03/03/2017 02/22/2017  Hearing? N N  Vision? N N  Difficulty concentrating or making decisions? N N  Walking or climbing stairs? Y Y  Dressing or bathing? N Y  Doing errands, shopping? Y N  Preparing Food and eating ? N -  Using the Toilet? N -  In the past six months, have you accidently leaked urine? N -  Do you have problems with loss of bowel control? N -  Managing your Medications? N -  Managing your Finances? N -  Housekeeping or managing your Housekeeping? Y -  Some recent data might be hidden    Fall/Depression Screening: Fall Risk  03/03/2017  Falls in the past year? Yes  Number falls in past yr: 2 or more  Injury with Fall? Yes  Risk for fall due to : Impaired mobility   PHQ 2/9 Scores 03/03/2017  PHQ - 2 Score 0    Assessment: Patient continues to benefit from care manager outreach for disease management and support.    Plan:  [facility] CM Care Plan Problem One     Most Recent Value  Care Plan Problem One  Recent admission for pneumonia  Role Documenting the Problem One  Care Management Telephonic Coordinator  Care Plan  for Problem One  Active  THN Long Term Goal   Patient will have no readmission in the next 31 days.   THN Long Term Goal Start Date  03/03/17  Interventions for Problem One Long Term Goal  RN CM discussed with patient signs of pneumonia and encouraged to take medications as ordered.  THN CM Short Term Goal #1   Patient will complete antibiotics and steroids as ordered within 10 days.  THN CM Short Term Goal #1 Start Date  03/03/17  [facility] CM Short Term Goal #1 Met Date  03/11/17  Interventions for Short Term Goal #1  patient completed medications.    THN CM Short Term Goal #2   Patient will be able to recognize signs of worsening pneumonia within 14 days.  THN CM  Short Term Goal #2 Start Date  03/03/17  Interventions for Short Term Goal #2  Reviewed with patient worsening signs of pneumonia.     RN CM will contact patient next week and patient agrees to next outreach.    Jone Baseman, RN, MSN [facility] Care Management Care Management Coordinator Direct Line [phone] Toll Free: [phone]  Fax: [phone]

## 2017-03-12 ENCOUNTER — Ambulatory Visit: Payer: Medicare Other

## 2017-03-16 DIAGNOSIS — M069 Rheumatoid arthritis, unspecified: Secondary | ICD-10-CM | POA: Diagnosis not present

## 2017-03-16 DIAGNOSIS — J42 Unspecified chronic bronchitis: Secondary | ICD-10-CM | POA: Diagnosis not present

## 2017-03-16 DIAGNOSIS — J4521 Mild intermittent asthma with (acute) exacerbation: Secondary | ICD-10-CM | POA: Diagnosis not present

## 2017-03-16 DIAGNOSIS — G8929 Other chronic pain: Secondary | ICD-10-CM | POA: Diagnosis not present

## 2017-03-16 DIAGNOSIS — J181 Lobar pneumonia, unspecified organism: Secondary | ICD-10-CM | POA: Diagnosis not present

## 2017-03-18 DIAGNOSIS — G8929 Other chronic pain: Secondary | ICD-10-CM | POA: Diagnosis not present

## 2017-03-18 DIAGNOSIS — J42 Unspecified chronic bronchitis: Secondary | ICD-10-CM | POA: Diagnosis not present

## 2017-03-18 DIAGNOSIS — M069 Rheumatoid arthritis, unspecified: Secondary | ICD-10-CM | POA: Diagnosis not present

## 2017-03-18 DIAGNOSIS — J181 Lobar pneumonia, unspecified organism: Secondary | ICD-10-CM | POA: Diagnosis not present

## 2017-03-18 DIAGNOSIS — J4521 Mild intermittent asthma with (acute) exacerbation: Secondary | ICD-10-CM | POA: Diagnosis not present

## 2017-03-19 ENCOUNTER — Other Ambulatory Visit: Payer: Self-pay

## 2017-03-19 NOTE — Patient Outreach (Signed)
[place] [facility]) Care Management  03/19/2017  LAELANI VASKO [birth year] [id number]   Telephone call to patient for final transition of care call. No answer.  HIPAA compliant voice message left.  Plan: RN CM will send letter and attempt patient again within 3 business days.    Jone Baseman, RN, MSN [facility] Care Management Care Management Coordinator Direct Line [phone] Toll Free: [phone]  Fax: [phone]

## 2017-03-23 ENCOUNTER — Other Ambulatory Visit: Payer: Self-pay

## 2017-03-23 DIAGNOSIS — J42 Unspecified chronic bronchitis: Secondary | ICD-10-CM | POA: Diagnosis not present

## 2017-03-23 DIAGNOSIS — G8929 Other chronic pain: Secondary | ICD-10-CM | POA: Diagnosis not present

## 2017-03-23 DIAGNOSIS — J4521 Mild intermittent asthma with (acute) exacerbation: Secondary | ICD-10-CM | POA: Diagnosis not present

## 2017-03-23 DIAGNOSIS — J181 Lobar pneumonia, unspecified organism: Secondary | ICD-10-CM | POA: Diagnosis not present

## 2017-03-23 DIAGNOSIS — M069 Rheumatoid arthritis, unspecified: Secondary | ICD-10-CM | POA: Diagnosis not present

## 2017-03-23 NOTE — Patient Outreach (Signed)
[place] [facility]) Care Management  [place]  03/23/2017   [name] August 16, 1933 [id number]  Subjective: Telephone call to patient for transition of care final call.  Patient reports she is doing good.  Patient reports that her cough is better but in the mornings she coughs but it clears as it gets later in the day.  Discussed with patient signs of pneumonia and notifying physician.  Also reminded patient of seeing her physician regularly of follow up care.  She verbalized understanding and thankful for the calls.    Objective:   Encounter Medications:  Outpatient Encounter Medications as of 03/23/2017  Medication Sig Note  . albuterol (PROVENTIL HFA;VENTOLIN HFA) 108 (90 Base) MCG/ACT inhaler Inhale 2 puffs into the lungs every 4 (four) hours as needed for wheezing or shortness of breath (cough, shortness of breath or wheezing.).   Marland Kitchen amLODipine (NORVASC) 5 MG tablet take 1 tablet by mouth once daily   . apixaban (ELIQUIS) 2.5 MG TABS tablet Take 1 tablet (2.5 mg total) by mouth 2 (two) times daily.   . ergocalciferol (VITAMIN D2) 50000 UNITS capsule Take 50,000 Units by mouth once a week. Takes on Thursday 01/02/2017: Patient takes on Fridays  . HYDROcodone-acetaminophen (NORCO/VICODIN) 5-325 MG tablet Take 1 tablet by mouth every 6 (six) hours as needed.   . leflunomide (ARAVA) 20 MG tablet Take 20 mg by mouth daily.   . metoprolol succinate (TOPROL-XL) 50 MG 24 hr tablet take 1 tablet by mouth twice a day   . Omega-3 Fatty Acids (FISH OIL) 1000 MG CAPS Take 1 capsule by mouth daily.   Marland Kitchen omeprazole (PRILOSEC) 20 MG capsule take 1 capsule by mouth twice a day   . traMADol (ULTRAM) 50 MG tablet Take 50 mg by mouth every 6 (six) hours as needed. 01/02/2017: Patient has if needed but has not taken in the past month  . predniSONE (DELTASONE) 20 MG tablet Take 3 PO QAM x3days, 2 PO QAM x3days, 1 PO QAM x3days (Patient not taking: Reported on 03/11/2017)    No  facility-administered encounter medications on file as of 03/23/2017.     Functional Status:  In your present state of health, do you have any difficulty performing the following activities: 03/03/2017 02/22/2017  Hearing? N N  Vision? N N  Difficulty concentrating or making decisions? N N  Walking or climbing stairs? Y Y  Dressing or bathing? N Y  Doing errands, shopping? Y N  Preparing Food and eating ? N -  Using the Toilet? N -  In the past six months, have you accidently leaked urine? N -  Do you have problems with loss of bowel control? N -  Managing your Medications? N -  Managing your Finances? N -  Housekeeping or managing your Housekeeping? Y -  Some recent data might be hidden    Fall/Depression Screening: Fall Risk  03/03/2017  Falls in the past year? Yes  Number falls in past yr: 2 or more  Injury with Fall? Yes  Risk for fall due to : Impaired mobility   PHQ 2/9 Scores 03/03/2017  PHQ - 2 Score 0    Assessment:Patient has met goals of care and is ready for case closure.  Plan:  [facility] CM Care Plan Problem One     Most Recent Value  Care Plan Problem One  Recent admission for pneumonia  Role Documenting the Problem One  Care Management Telephonic Coordinator  Care Plan for Problem One  Active  THN Long Term Goal   Patient will have no readmission in the next 31 days.   THN Long Term Goal Start Date  03/03/17  THN Long Term Goal Met Date  03/23/17  Interventions for Problem One Long Term Goal  no readmissions  THN CM Short Term Goal #1   Patient will complete antibiotics and steroids as ordered within 10 days.  THN CM Short Term Goal #1 Start Date  03/03/17  [facility] CM Short Term Goal #1 Met Date  03/11/17  Interventions for Short Term Goal #1  patient completed medications.    THN CM Short Term Goal #2   Patient will be able to recognize signs of worsening pneumonia within 14 days.  THN CM Short Term Goal #2 Start Date  03/03/17  [facility] CM Short Term Goal #2 Met Date   03/23/17     RN CM will close case and notify care management assistant.    Jone Baseman, RN, MSN [facility] Care Management Care Management Coordinator Direct Line [phone] Toll Free: [phone]  Fax: [phone]

## 2017-03-25 ENCOUNTER — Other Ambulatory Visit: Payer: Self-pay | Admitting: Cardiovascular Disease

## 2017-03-26 DIAGNOSIS — J181 Lobar pneumonia, unspecified organism: Secondary | ICD-10-CM | POA: Diagnosis not present

## 2017-03-26 DIAGNOSIS — M069 Rheumatoid arthritis, unspecified: Secondary | ICD-10-CM | POA: Diagnosis not present

## 2017-03-26 DIAGNOSIS — G8929 Other chronic pain: Secondary | ICD-10-CM | POA: Diagnosis not present

## 2017-03-26 DIAGNOSIS — J42 Unspecified chronic bronchitis: Secondary | ICD-10-CM | POA: Diagnosis not present

## 2017-03-26 DIAGNOSIS — J4521 Mild intermittent asthma with (acute) exacerbation: Secondary | ICD-10-CM | POA: Diagnosis not present

## 2017-03-30 ENCOUNTER — Encounter (INDEPENDENT_AMBULATORY_CARE_PROVIDER_SITE_OTHER): Payer: Self-pay | Admitting: *Deleted

## 2017-04-02 DIAGNOSIS — J4521 Mild intermittent asthma with (acute) exacerbation: Secondary | ICD-10-CM | POA: Diagnosis not present

## 2017-04-02 DIAGNOSIS — M069 Rheumatoid arthritis, unspecified: Secondary | ICD-10-CM | POA: Diagnosis not present

## 2017-04-02 DIAGNOSIS — J42 Unspecified chronic bronchitis: Secondary | ICD-10-CM | POA: Diagnosis not present

## 2017-04-02 DIAGNOSIS — J181 Lobar pneumonia, unspecified organism: Secondary | ICD-10-CM | POA: Diagnosis not present

## 2017-04-02 DIAGNOSIS — G8929 Other chronic pain: Secondary | ICD-10-CM | POA: Diagnosis not present

## 2017-04-06 DIAGNOSIS — J4521 Mild intermittent asthma with (acute) exacerbation: Secondary | ICD-10-CM | POA: Diagnosis not present

## 2017-04-06 DIAGNOSIS — G8929 Other chronic pain: Secondary | ICD-10-CM | POA: Diagnosis not present

## 2017-04-06 DIAGNOSIS — M069 Rheumatoid arthritis, unspecified: Secondary | ICD-10-CM | POA: Diagnosis not present

## 2017-04-06 DIAGNOSIS — J181 Lobar pneumonia, unspecified organism: Secondary | ICD-10-CM | POA: Diagnosis not present

## 2017-04-06 DIAGNOSIS — J42 Unspecified chronic bronchitis: Secondary | ICD-10-CM | POA: Diagnosis not present

## 2017-04-07 ENCOUNTER — Ambulatory Visit: Payer: Medicare Other | Admitting: Cardiovascular Disease

## 2017-04-07 ENCOUNTER — Encounter: Payer: Self-pay | Admitting: Cardiovascular Disease

## 2017-04-07 VITALS — BP 144/74 | HR 84 | Ht 62.0 in | Wt 119.0 lb

## 2017-04-07 DIAGNOSIS — I480 Paroxysmal atrial fibrillation: Secondary | ICD-10-CM | POA: Diagnosis not present

## 2017-04-07 DIAGNOSIS — I10 Essential (primary) hypertension: Secondary | ICD-10-CM

## 2017-04-07 NOTE — Progress Notes (Signed)
SUBJECTIVE: The patient presents for routine follow-up.  She was hospitalized for right upper lobe pneumonia in February 2019. She has a history of paroxysmal atrial fibrillation and hypertension and takes apixaban for anticoagulation.   She told me she underwent back surgery and left rotator cuff surgery.  Her husband is now in a nursing home which is led to a lot of stress and anxiety for her.  She denies chest pain and has occasional palpitations.  She finished physical therapy yesterday.  She said her heart rate and blood pressure were routinely checked and were found to be normal.     Review of Systems: As per "subjective", otherwise negative.  Allergies  Allergen Reactions  . Iodinated Diagnostic Agents Anaphylaxis    08-12-10 Pt had severe reaction with itching/hives/loss of consciousness within 5 minutes of CEPI today.  She received both Kenalog and Iondinated Contrast, so not certain which agent caused reaction.  If patient returns for another CEPI, Dr. Jobe Igo considers using Decadron and a 13-hour prep.  jkl  . Nerve Block Tray Anaphylaxis    Patient states that she was sent to have Epidural for her shoulder. After rec'ving she remembers saying that she was itching all over  And that's it. She was transported to hospital  By EMS. Stayed overnight.  [place] Spikes Derivatives Hives and Nausea And Vomiting  . Triamcinolone Acetonide Anaphylaxis    08-12-10  Pt had severe reaction with itching/hives/loss of consciousness within five minutes of CEPI today.  She received both Kenalog and Iodinated Contrast, so not certain which agent caused reaction.  If pt returns for another cervical epidural steroid injection, Dr. Jobe Igo suggests using Decadron and a 13-hr prep.  JKL PATIENT USES PROCTOCREAM 2.5% WITHOUT DIFFICULTY FOR VULVAR DYSTROPHY Patient says she can take prednisone without any problem  . Vibra-Tab [Doxycycline] Nausea And Vomiting    Current Outpatient Medications    Medication Sig Dispense Refill  . amLODipine (NORVASC) 5 MG tablet take 1 tablet by mouth once daily 90 tablet 3  . HYDROcodone-acetaminophen (NORCO/VICODIN) 5-325 MG tablet Take 1 tablet by mouth every 6 (six) hours as needed.    . leflunomide (ARAVA) 20 MG tablet Take 20 mg by mouth daily.    . metoprolol succinate (TOPROL-XL) 50 MG 24 hr tablet TAKE 1 TABLET BY MOUTH TWICE A DAY 60 tablet 6  . omeprazole (PRILOSEC) 20 MG capsule take 1 capsule by mouth twice a day 60 capsule 5  . apixaban (ELIQUIS) 2.5 MG TABS tablet Take 1 tablet (2.5 mg total) by mouth 2 (two) times daily. 60 tablet 0   No current facility-administered medications for this visit.     Past Medical History:  Diagnosis Date  . A-fib ([place])   . Atrial fibrillation ([place])   . Cervicalgia   . Colon cancer ([place])   . Degeneration of cervical intervertebral disc   . Hypertension   . Pinched nerve in shoulder   . Rheumatoid arthritis(714.0)   . RUQ pain   . Vulvar dystrophy   . Yeast infection     Past Surgical History:  Procedure Laterality Date  . ABDOMINAL HYSTERECTOMY  1960'S  . APPENDECTOMY  with hysterectomy  . BACK SURGERY  12/08/10   plate/screws C3 C4  . CHOLECYSTECTOMY  2002  . COLONOSCOPY  04/02/2009  . COLONOSCOPY  06/04/05  . COLONOSCOPY N/A 07/26/2015   Procedure: COLONOSCOPY;  Surgeon: Rogene Houston, MD;  Location: [facility];  Service: Endoscopy;  Laterality: N/A;  1240  . COLONOSCOPY WITH ESOPHAGOGASTRODUODENOSCOPY (EGD) N/A 04/08/2012   Procedure: COLONOSCOPY WITH ESOPHAGOGASTRODUODENOSCOPY (EGD);  Surgeon: Rogene Houston, MD;  Location: [facility];  Service: Endoscopy;  Laterality: N/A;  130-moved to 245 Ann notified pt  . ESOPHAGOGASTRODUODENOSCOPY N/A 07/26/2015   Procedure: ESOPHAGOGASTRODUODENOSCOPY (EGD);  Surgeon: Rogene Houston, MD;  Location: [facility];  Service: Endoscopy;  Laterality: N/A;  . HEMICOLECTOMY  10/05    Social History   Socioeconomic History  . Marital  status: Married    Spouse name: Not on file  . Number of children: Not on file  . Years of education: Not on file  . Highest education level: Not on file  Occupational History  . Not on file  Social Needs  . Financial resource strain: Not on file  . Food insecurity:    Worry: Not on file    Inability: Not on file  . Transportation needs:    Medical: Not on file    Non-medical: Not on file  Tobacco Use  . Smoking status: Never Smoker  . Smokeless tobacco: Never Used  Substance and Sexual Activity  . Alcohol use: No  . Drug use: No  . Sexual activity: Never  Lifestyle  . Physical activity:    Days per week: Not on file    Minutes per session: Not on file  . Stress: Not on file  Relationships  . Social connections:    Talks on phone: Not on file    Gets together: Not on file    Attends religious service: Not on file    Active member of club or organization: Not on file    Attends meetings of clubs or organizations: Not on file    Relationship status: Not on file  . Intimate partner violence:    Fear of current or ex partner: Not on file    Emotionally abused: Not on file    Physically abused: Not on file    Forced sexual activity: Not on file  Other Topics Concern  . Not on file  Social History Narrative  . Not on file     Vitals:   04/07/17 1456  BP: (!) 144/74  Pulse: 84  SpO2: 96%  Weight: 119 lb (54 kg)  Height: 5\' 2"  (1.575 m)    Wt Readings from Last 3 Encounters:  04/07/17 119 lb (54 kg)  02/24/17 185 lb 13.6 oz (84.3 kg)  01/02/17 120 lb (54.4 kg)     PHYSICAL EXAM General: NAD HEENT: Normal. Neck: No JVD, no thyromegaly. Lungs: Clear to auscultation bilaterally with normal respiratory effort. CV: Regular rate and rhythm, normal S1/S2, no S3/S4, no murmur. No pretibial or periankle edema.  No carotid bruit.   Abdomen: Soft, nontender, no distention.  Neurologic: Alert and oriented.  Psych: Normal affect. Skin: Normal. Musculoskeletal: No  gross deformities.    ECG: Most recent ECG reviewed.   Labs: Lab Results  Component Value Date/Time   K 4.2 02/24/2017 05:52 AM   BUN 35 (H) 02/24/2017 05:52 AM   CREATININE 0.69 02/24/2017 05:52 AM   CREATININE 0.65 08/10/2013 02:07 PM   ALT 16 02/22/2017 09:23 AM   TSH 0.441 02/22/2017 04:40 PM   TSH 0.873 07/04/2013 04:39 PM   HGB 10.9 (L) 02/24/2017 05:52 AM     Lipids: Lab Results  Component Value Date/Time   LDLCALC (H) 08/25/2008 04:40 AM    140        Total Cholesterol/HDL:CHD Risk Coronary Heart Disease  Risk Table                     Men   Women  1/2 Average Risk   3.4   3.3  Average Risk       5.0   4.4  2 X Average Risk   9.6   7.1  3 X Average Risk  23.4   11.0        Use the calculated Patient Ratio above and the CHD Risk Table to determine the patient's CHD Risk.        ATP III CLASSIFICATION (LDL):  <100     mg/dL   Optimal  100-129  mg/dL   Near or Above                    Optimal  130-159  mg/dL   Borderline  160-189  mg/dL   High  >190     mg/dL   Very High   CHOL (H) 08/25/2008 04:40 AM    209        ATP III CLASSIFICATION:  <200     mg/dL   Desirable  200-239  mg/dL   Borderline High  >=240    mg/dL   High          TRIG 126 08/25/2008 04:40 AM   HDL 44 08/25/2008 04:40 AM       ASSESSMENT AND PLAN: 1. Paroxysmal atrial fibrillation: Symptoms fairly well controlled on Toprol-XL 50 mg twice daily. Continue Eliquis 2.5 mg twice daily for systemic anticoagulation.   2. Essential HTN: Blood pressure is mildly elevated.  She is no longer on lisinopril.  She said her blood pressure was routinely checked by physical therapy in the past several weeks and it was routinely normal.  I will continue to monitor.      Disposition: Follow up 1 year   Kate Sable, M.D., F.A.C.C.

## 2017-04-07 NOTE — Patient Instructions (Signed)

## 2017-04-12 ENCOUNTER — Other Ambulatory Visit (INDEPENDENT_AMBULATORY_CARE_PROVIDER_SITE_OTHER): Payer: Self-pay | Admitting: Internal Medicine

## 2017-04-20 ENCOUNTER — Ambulatory Visit (HOSPITAL_COMMUNITY)
Admission: RE | Admit: 2017-04-20 | Discharge: 2017-04-20 | Disposition: A | Discharge status: Home / Self Care | Payer: Medicare Other | Source: Ambulatory Visit | Attending: Family Medicine | Admitting: Family Medicine

## 2017-04-20 ENCOUNTER — Other Ambulatory Visit (HOSPITAL_COMMUNITY): Payer: Self-pay | Admitting: Family Medicine

## 2017-04-20 DIAGNOSIS — M810 Age-related osteoporosis without current pathological fracture: Secondary | ICD-10-CM | POA: Insufficient documentation

## 2017-04-20 DIAGNOSIS — I700 Atherosclerosis of aorta: Secondary | ICD-10-CM | POA: Diagnosis not present

## 2017-04-20 DIAGNOSIS — M4856XA Collapsed vertebra, not elsewhere classified, lumbar region, initial encounter for fracture: Secondary | ICD-10-CM | POA: Insufficient documentation

## 2017-04-20 DIAGNOSIS — R52 Pain, unspecified: Secondary | ICD-10-CM

## 2017-04-20 DIAGNOSIS — Z6822 Body mass index (BMI) 22.0-22.9, adult: Secondary | ICD-10-CM | POA: Diagnosis not present

## 2017-04-20 DIAGNOSIS — S3992XA Unspecified injury of lower back, initial encounter: Secondary | ICD-10-CM | POA: Diagnosis not present

## 2017-04-20 DIAGNOSIS — M5136 Other intervertebral disc degeneration, lumbar region: Secondary | ICD-10-CM | POA: Diagnosis not present

## 2017-04-20 DIAGNOSIS — M1991 Primary osteoarthritis, unspecified site: Secondary | ICD-10-CM | POA: Insufficient documentation

## 2017-04-20 DIAGNOSIS — M545 Low back pain: Secondary | ICD-10-CM | POA: Diagnosis not present

## 2017-04-23 ENCOUNTER — Telehealth: Payer: Self-pay | Admitting: Cardiovascular Disease

## 2017-04-23 DIAGNOSIS — S32010A Wedge compression fracture of first lumbar vertebra, initial encounter for closed fracture: Secondary | ICD-10-CM | POA: Diagnosis not present

## 2017-04-23 NOTE — Telephone Encounter (Signed)
Returned call to Dr. Carloyn Manner..Got answering service. Left message for him to return call.

## 2017-04-26 DIAGNOSIS — Z79891 Long term (current) use of opiate analgesic: Secondary | ICD-10-CM | POA: Diagnosis not present

## 2017-04-26 DIAGNOSIS — M810 Age-related osteoporosis without current pathological fracture: Secondary | ICD-10-CM | POA: Diagnosis not present

## 2017-04-26 DIAGNOSIS — I10 Essential (primary) hypertension: Secondary | ICD-10-CM | POA: Diagnosis not present

## 2017-04-26 DIAGNOSIS — K219 Gastro-esophageal reflux disease without esophagitis: Secondary | ICD-10-CM | POA: Diagnosis not present

## 2017-04-26 DIAGNOSIS — I4891 Unspecified atrial fibrillation: Secondary | ICD-10-CM | POA: Diagnosis not present

## 2017-04-26 DIAGNOSIS — Z8261 Family history of arthritis: Secondary | ICD-10-CM | POA: Diagnosis not present

## 2017-04-26 DIAGNOSIS — M1990 Unspecified osteoarthritis, unspecified site: Secondary | ICD-10-CM | POA: Diagnosis not present

## 2017-04-26 DIAGNOSIS — Z9049 Acquired absence of other specified parts of digestive tract: Secondary | ICD-10-CM | POA: Diagnosis not present

## 2017-04-26 DIAGNOSIS — Z85828 Personal history of other malignant neoplasm of skin: Secondary | ICD-10-CM | POA: Diagnosis not present

## 2017-04-26 DIAGNOSIS — Z883 Allergy status to other anti-infective agents status: Secondary | ICD-10-CM | POA: Diagnosis not present

## 2017-04-26 DIAGNOSIS — Z85038 Personal history of other malignant neoplasm of large intestine: Secondary | ICD-10-CM | POA: Diagnosis not present

## 2017-04-26 DIAGNOSIS — M069 Rheumatoid arthritis, unspecified: Secondary | ICD-10-CM | POA: Diagnosis not present

## 2017-04-26 DIAGNOSIS — Z888 Allergy status to other drugs, medicaments and biological substances status: Secondary | ICD-10-CM | POA: Diagnosis not present

## 2017-04-26 DIAGNOSIS — Z79899 Other long term (current) drug therapy: Secondary | ICD-10-CM | POA: Diagnosis not present

## 2017-04-26 DIAGNOSIS — Z7902 Long term (current) use of antithrombotics/antiplatelets: Secondary | ICD-10-CM | POA: Diagnosis not present

## 2017-04-26 DIAGNOSIS — S32010A Wedge compression fracture of first lumbar vertebra, initial encounter for closed fracture: Secondary | ICD-10-CM | POA: Diagnosis not present

## 2017-04-26 DIAGNOSIS — Z8249 Family history of ischemic heart disease and other diseases of the circulatory system: Secondary | ICD-10-CM | POA: Diagnosis not present

## 2017-04-26 DIAGNOSIS — Z981 Arthrodesis status: Secondary | ICD-10-CM | POA: Diagnosis not present

## 2017-04-26 DIAGNOSIS — Z933 Colostomy status: Secondary | ICD-10-CM | POA: Diagnosis not present

## 2017-04-26 DIAGNOSIS — Z7982 Long term (current) use of aspirin: Secondary | ICD-10-CM | POA: Diagnosis not present

## 2017-04-26 NOTE — Telephone Encounter (Signed)
Please return Dr. Estanislado Emms call @ [phone]

## 2017-04-26 NOTE — Telephone Encounter (Signed)
Spoke with Dr. Carloyn Manner, let him know Dr. Court Joy recommendations to hold Eliquis 3-4 days prior to procedure. He agreed to plan .

## 2017-04-28 DIAGNOSIS — Z888 Allergy status to other drugs, medicaments and biological substances status: Secondary | ICD-10-CM | POA: Diagnosis not present

## 2017-04-28 DIAGNOSIS — Z85828 Personal history of other malignant neoplasm of skin: Secondary | ICD-10-CM | POA: Diagnosis not present

## 2017-04-28 DIAGNOSIS — Z8261 Family history of arthritis: Secondary | ICD-10-CM | POA: Diagnosis not present

## 2017-04-28 DIAGNOSIS — J189 Pneumonia, unspecified organism: Secondary | ICD-10-CM | POA: Diagnosis not present

## 2017-04-28 DIAGNOSIS — Z7982 Long term (current) use of aspirin: Secondary | ICD-10-CM | POA: Diagnosis not present

## 2017-04-28 DIAGNOSIS — Z79891 Long term (current) use of opiate analgesic: Secondary | ICD-10-CM | POA: Diagnosis not present

## 2017-04-28 DIAGNOSIS — M069 Rheumatoid arthritis, unspecified: Secondary | ICD-10-CM | POA: Diagnosis not present

## 2017-04-28 DIAGNOSIS — Z8249 Family history of ischemic heart disease and other diseases of the circulatory system: Secondary | ICD-10-CM | POA: Diagnosis not present

## 2017-04-28 DIAGNOSIS — Z9049 Acquired absence of other specified parts of digestive tract: Secondary | ICD-10-CM | POA: Diagnosis not present

## 2017-04-28 DIAGNOSIS — Z85038 Personal history of other malignant neoplasm of large intestine: Secondary | ICD-10-CM | POA: Diagnosis not present

## 2017-04-28 DIAGNOSIS — Z79899 Other long term (current) drug therapy: Secondary | ICD-10-CM | POA: Diagnosis not present

## 2017-04-28 DIAGNOSIS — I10 Essential (primary) hypertension: Secondary | ICD-10-CM | POA: Diagnosis not present

## 2017-04-28 DIAGNOSIS — M810 Age-related osteoporosis without current pathological fracture: Secondary | ICD-10-CM | POA: Diagnosis not present

## 2017-04-28 DIAGNOSIS — Z981 Arthrodesis status: Secondary | ICD-10-CM | POA: Diagnosis not present

## 2017-04-28 DIAGNOSIS — Z933 Colostomy status: Secondary | ICD-10-CM | POA: Diagnosis not present

## 2017-04-28 DIAGNOSIS — I4891 Unspecified atrial fibrillation: Secondary | ICD-10-CM | POA: Diagnosis not present

## 2017-04-28 DIAGNOSIS — Z883 Allergy status to other anti-infective agents status: Secondary | ICD-10-CM | POA: Diagnosis not present

## 2017-04-28 DIAGNOSIS — K219 Gastro-esophageal reflux disease without esophagitis: Secondary | ICD-10-CM | POA: Diagnosis not present

## 2017-04-28 DIAGNOSIS — S32010A Wedge compression fracture of first lumbar vertebra, initial encounter for closed fracture: Secondary | ICD-10-CM | POA: Diagnosis not present

## 2017-04-28 DIAGNOSIS — M1990 Unspecified osteoarthritis, unspecified site: Secondary | ICD-10-CM | POA: Diagnosis not present

## 2017-04-28 DIAGNOSIS — Z7902 Long term (current) use of antithrombotics/antiplatelets: Secondary | ICD-10-CM | POA: Diagnosis not present

## 2017-04-29 DIAGNOSIS — Z933 Colostomy status: Secondary | ICD-10-CM | POA: Diagnosis not present

## 2017-04-29 DIAGNOSIS — Z9049 Acquired absence of other specified parts of digestive tract: Secondary | ICD-10-CM | POA: Diagnosis not present

## 2017-04-29 DIAGNOSIS — M1990 Unspecified osteoarthritis, unspecified site: Secondary | ICD-10-CM | POA: Diagnosis not present

## 2017-04-29 DIAGNOSIS — M810 Age-related osteoporosis without current pathological fracture: Secondary | ICD-10-CM | POA: Diagnosis not present

## 2017-04-29 DIAGNOSIS — Z85038 Personal history of other malignant neoplasm of large intestine: Secondary | ICD-10-CM | POA: Diagnosis not present

## 2017-04-29 DIAGNOSIS — I10 Essential (primary) hypertension: Secondary | ICD-10-CM | POA: Diagnosis not present

## 2017-04-29 DIAGNOSIS — S32010A Wedge compression fracture of first lumbar vertebra, initial encounter for closed fracture: Secondary | ICD-10-CM | POA: Diagnosis not present

## 2017-04-29 DIAGNOSIS — Z883 Allergy status to other anti-infective agents status: Secondary | ICD-10-CM | POA: Diagnosis not present

## 2017-04-29 DIAGNOSIS — Z79899 Other long term (current) drug therapy: Secondary | ICD-10-CM | POA: Diagnosis not present

## 2017-04-29 DIAGNOSIS — Z7902 Long term (current) use of antithrombotics/antiplatelets: Secondary | ICD-10-CM | POA: Diagnosis not present

## 2017-04-29 DIAGNOSIS — Z8249 Family history of ischemic heart disease and other diseases of the circulatory system: Secondary | ICD-10-CM | POA: Diagnosis not present

## 2017-04-29 DIAGNOSIS — Z8261 Family history of arthritis: Secondary | ICD-10-CM | POA: Diagnosis not present

## 2017-04-29 DIAGNOSIS — I4891 Unspecified atrial fibrillation: Secondary | ICD-10-CM | POA: Diagnosis not present

## 2017-04-29 DIAGNOSIS — Z981 Arthrodesis status: Secondary | ICD-10-CM | POA: Diagnosis not present

## 2017-04-29 DIAGNOSIS — Z7982 Long term (current) use of aspirin: Secondary | ICD-10-CM | POA: Diagnosis not present

## 2017-04-29 DIAGNOSIS — M069 Rheumatoid arthritis, unspecified: Secondary | ICD-10-CM | POA: Diagnosis not present

## 2017-04-29 DIAGNOSIS — K219 Gastro-esophageal reflux disease without esophagitis: Secondary | ICD-10-CM | POA: Diagnosis not present

## 2017-04-29 DIAGNOSIS — Z888 Allergy status to other drugs, medicaments and biological substances status: Secondary | ICD-10-CM | POA: Diagnosis not present

## 2017-04-29 DIAGNOSIS — Z79891 Long term (current) use of opiate analgesic: Secondary | ICD-10-CM | POA: Diagnosis not present

## 2017-04-29 DIAGNOSIS — Z85828 Personal history of other malignant neoplasm of skin: Secondary | ICD-10-CM | POA: Diagnosis not present

## 2017-05-04 DIAGNOSIS — H61001 Unspecified perichondritis of right external ear: Secondary | ICD-10-CM | POA: Diagnosis not present

## 2017-05-06 DIAGNOSIS — S32010A Wedge compression fracture of first lumbar vertebra, initial encounter for closed fracture: Secondary | ICD-10-CM | POA: Insufficient documentation

## 2017-05-12 ENCOUNTER — Telehealth: Payer: Self-pay | Admitting: *Deleted

## 2017-05-12 NOTE — Telephone Encounter (Signed)
Received Patient assistance Eliquis 5 mg in office for pt. Called [employer] to notify that pt is no longer on 5 mg. Spoke with Eddie Dibbles from [facility] who states that he will correct the matter and have the correct dose mailed  to the [place] office.

## 2017-05-13 DIAGNOSIS — M5431 Sciatica, right side: Secondary | ICD-10-CM | POA: Diagnosis not present

## 2017-05-13 DIAGNOSIS — Z6821 Body mass index (BMI) 21.0-21.9, adult: Secondary | ICD-10-CM | POA: Diagnosis not present

## 2017-05-13 DIAGNOSIS — Z1389 Encounter for screening for other disorder: Secondary | ICD-10-CM | POA: Diagnosis not present

## 2017-05-17 ENCOUNTER — Telehealth: Payer: Self-pay | Admitting: *Deleted

## 2017-05-17 NOTE — Telephone Encounter (Signed)
Pt notified that Eliquis 2.5 mg patient assistance has arrived in office.

## 2017-05-20 DIAGNOSIS — Z6821 Body mass index (BMI) 21.0-21.9, adult: Secondary | ICD-10-CM | POA: Diagnosis not present

## 2017-05-20 DIAGNOSIS — B029 Zoster without complications: Secondary | ICD-10-CM | POA: Diagnosis not present

## 2017-06-01 DIAGNOSIS — M5137 Other intervertebral disc degeneration, lumbosacral region: Secondary | ICD-10-CM | POA: Diagnosis not present

## 2017-06-01 DIAGNOSIS — Z6821 Body mass index (BMI) 21.0-21.9, adult: Secondary | ICD-10-CM | POA: Diagnosis not present

## 2017-06-01 DIAGNOSIS — B029 Zoster without complications: Secondary | ICD-10-CM | POA: Diagnosis not present

## 2017-06-01 DIAGNOSIS — B0229 Other postherpetic nervous system involvement: Secondary | ICD-10-CM | POA: Diagnosis not present

## 2017-06-02 DIAGNOSIS — M549 Dorsalgia, unspecified: Secondary | ICD-10-CM | POA: Diagnosis not present

## 2017-06-02 DIAGNOSIS — M25461 Effusion, right knee: Secondary | ICD-10-CM | POA: Diagnosis not present

## 2017-06-02 DIAGNOSIS — M0589 Other rheumatoid arthritis with rheumatoid factor of multiple sites: Secondary | ICD-10-CM | POA: Diagnosis not present

## 2017-06-02 DIAGNOSIS — M150 Primary generalized (osteo)arthritis: Secondary | ICD-10-CM | POA: Diagnosis not present

## 2017-06-02 DIAGNOSIS — Z79899 Other long term (current) drug therapy: Secondary | ICD-10-CM | POA: Diagnosis not present

## 2017-06-24 DIAGNOSIS — E782 Mixed hyperlipidemia: Secondary | ICD-10-CM | POA: Diagnosis not present

## 2017-06-24 DIAGNOSIS — Z6822 Body mass index (BMI) 22.0-22.9, adult: Secondary | ICD-10-CM | POA: Diagnosis not present

## 2017-06-24 DIAGNOSIS — Z0001 Encounter for general adult medical examination with abnormal findings: Secondary | ICD-10-CM | POA: Diagnosis not present

## 2017-06-24 DIAGNOSIS — Z1389 Encounter for screening for other disorder: Secondary | ICD-10-CM | POA: Diagnosis not present

## 2017-06-24 DIAGNOSIS — I10 Essential (primary) hypertension: Secondary | ICD-10-CM | POA: Diagnosis not present

## 2017-06-24 DIAGNOSIS — M5030 Other cervical disc degeneration, unspecified cervical region: Secondary | ICD-10-CM | POA: Diagnosis not present

## 2017-06-24 DIAGNOSIS — M069 Rheumatoid arthritis, unspecified: Secondary | ICD-10-CM | POA: Diagnosis not present

## 2017-08-24 DIAGNOSIS — B353 Tinea pedis: Secondary | ICD-10-CM | POA: Diagnosis not present

## 2017-08-24 DIAGNOSIS — B351 Tinea unguium: Secondary | ICD-10-CM | POA: Diagnosis not present

## 2017-09-02 DIAGNOSIS — M549 Dorsalgia, unspecified: Secondary | ICD-10-CM | POA: Diagnosis not present

## 2017-09-02 DIAGNOSIS — Z79899 Other long term (current) drug therapy: Secondary | ICD-10-CM | POA: Diagnosis not present

## 2017-09-02 DIAGNOSIS — B0229 Other postherpetic nervous system involvement: Secondary | ICD-10-CM | POA: Diagnosis not present

## 2017-09-02 DIAGNOSIS — M0589 Other rheumatoid arthritis with rheumatoid factor of multiple sites: Secondary | ICD-10-CM | POA: Diagnosis not present

## 2017-09-02 DIAGNOSIS — G8929 Other chronic pain: Secondary | ICD-10-CM | POA: Insufficient documentation

## 2017-09-02 DIAGNOSIS — M150 Primary generalized (osteo)arthritis: Secondary | ICD-10-CM | POA: Diagnosis not present

## 2017-09-02 DIAGNOSIS — M545 Low back pain: Secondary | ICD-10-CM | POA: Diagnosis not present

## 2017-09-02 DIAGNOSIS — M5136 Other intervertebral disc degeneration, lumbar region: Secondary | ICD-10-CM | POA: Diagnosis not present

## 2017-09-08 DIAGNOSIS — Z79899 Other long term (current) drug therapy: Secondary | ICD-10-CM | POA: Diagnosis not present

## 2017-10-06 DIAGNOSIS — Z6822 Body mass index (BMI) 22.0-22.9, adult: Secondary | ICD-10-CM | POA: Diagnosis not present

## 2017-10-06 DIAGNOSIS — G894 Chronic pain syndrome: Secondary | ICD-10-CM | POA: Diagnosis not present

## 2017-10-06 DIAGNOSIS — Z1389 Encounter for screening for other disorder: Secondary | ICD-10-CM | POA: Diagnosis not present

## 2017-10-06 DIAGNOSIS — Z23 Encounter for immunization: Secondary | ICD-10-CM | POA: Diagnosis not present

## 2017-10-06 DIAGNOSIS — B0229 Other postherpetic nervous system involvement: Secondary | ICD-10-CM | POA: Diagnosis not present

## 2017-10-15 ENCOUNTER — Other Ambulatory Visit: Payer: Self-pay | Admitting: Cardiovascular Disease

## 2017-11-01 DIAGNOSIS — J3489 Other specified disorders of nose and nasal sinuses: Secondary | ICD-10-CM | POA: Diagnosis not present

## 2017-11-01 DIAGNOSIS — J329 Chronic sinusitis, unspecified: Secondary | ICD-10-CM | POA: Diagnosis not present

## 2017-11-01 DIAGNOSIS — Z1389 Encounter for screening for other disorder: Secondary | ICD-10-CM | POA: Diagnosis not present

## 2017-11-11 ENCOUNTER — Other Ambulatory Visit: Payer: Self-pay | Admitting: Cardiovascular Disease

## 2017-11-25 DIAGNOSIS — M150 Primary generalized (osteo)arthritis: Secondary | ICD-10-CM | POA: Diagnosis not present

## 2017-11-25 DIAGNOSIS — M5136 Other intervertebral disc degeneration, lumbar region: Secondary | ICD-10-CM | POA: Diagnosis not present

## 2017-11-25 DIAGNOSIS — M0589 Other rheumatoid arthritis with rheumatoid factor of multiple sites: Secondary | ICD-10-CM | POA: Diagnosis not present

## 2017-11-25 DIAGNOSIS — Z79899 Other long term (current) drug therapy: Secondary | ICD-10-CM | POA: Diagnosis not present

## 2017-11-25 DIAGNOSIS — M549 Dorsalgia, unspecified: Secondary | ICD-10-CM | POA: Diagnosis not present

## 2018-01-06 DIAGNOSIS — G894 Chronic pain syndrome: Secondary | ICD-10-CM | POA: Diagnosis not present

## 2018-01-16 ENCOUNTER — Other Ambulatory Visit: Payer: Self-pay | Admitting: Cardiology

## 2018-02-03 ENCOUNTER — Telehealth: Payer: Self-pay | Admitting: Cardiovascular Disease

## 2018-02-03 DIAGNOSIS — G894 Chronic pain syndrome: Secondary | ICD-10-CM | POA: Diagnosis not present

## 2018-02-03 DIAGNOSIS — Z1389 Encounter for screening for other disorder: Secondary | ICD-10-CM | POA: Diagnosis not present

## 2018-02-03 NOTE — Telephone Encounter (Signed)
Called patient. No answer. Left message to call back.  

## 2018-02-03 NOTE — Telephone Encounter (Signed)
Spoke with John at [facility] who states that pt needs to resubmit a new application.

## 2018-02-03 NOTE — Telephone Encounter (Signed)
Pt has not heard back from her assistance for Eliquis, she turned the paper work in in November of last year. Her son states she may need some samples for next month.

## 2018-02-07 NOTE — Telephone Encounter (Signed)
Left message returning call , can be reached @ [phone]

## 2018-02-08 NOTE — Telephone Encounter (Signed)
Faxed assistance papers 02/08/2018.

## 2018-02-24 ENCOUNTER — Telehealth: Payer: Self-pay | Admitting: Cardiovascular Disease

## 2018-02-24 NOTE — Telephone Encounter (Signed)
Per message from Butch Penny w/ [employer] -- she's calling in regards to the pt's Eliquis application, states they have not received the patients federal tax return or the signed consent sheet.   Fax [phone]  Telephone # [phone]  Case # [id number]

## 2018-02-24 NOTE — Telephone Encounter (Signed)
L.McGhee CMA, faxed patients completed form back to [employer], which included pt's proof of income (monthly SSI ) and her signature

## 2018-02-25 DIAGNOSIS — M0589 Other rheumatoid arthritis with rheumatoid factor of multiple sites: Secondary | ICD-10-CM | POA: Diagnosis not present

## 2018-03-04 DIAGNOSIS — M5030 Other cervical disc degeneration, unspecified cervical region: Secondary | ICD-10-CM | POA: Diagnosis not present

## 2018-03-04 DIAGNOSIS — Z0001 Encounter for general adult medical examination with abnormal findings: Secondary | ICD-10-CM | POA: Diagnosis not present

## 2018-03-04 DIAGNOSIS — M069 Rheumatoid arthritis, unspecified: Secondary | ICD-10-CM | POA: Diagnosis not present

## 2018-03-04 DIAGNOSIS — Z1389 Encounter for screening for other disorder: Secondary | ICD-10-CM | POA: Diagnosis not present

## 2018-03-04 DIAGNOSIS — I10 Essential (primary) hypertension: Secondary | ICD-10-CM | POA: Diagnosis not present

## 2018-03-04 DIAGNOSIS — E559 Vitamin D deficiency, unspecified: Secondary | ICD-10-CM | POA: Diagnosis not present

## 2018-04-12 DIAGNOSIS — G894 Chronic pain syndrome: Secondary | ICD-10-CM | POA: Diagnosis not present

## 2018-04-12 DIAGNOSIS — E7849 Other hyperlipidemia: Secondary | ICD-10-CM | POA: Diagnosis not present

## 2018-04-14 IMAGING — DX DG LUMBAR SPINE 2-3V
3 series · 3 of 3 positions shown · non-contrast
Comparison: 05/25/2016 MR and 08/20/2016 kyphoplasty images.

CLINICAL DATA: 83-year-old female fell out of wheelchair 9 days
ago. Lower back pain. Prior back surgery. Initial encounter.

EXAM:
LUMBAR SPINE - 2-3 VIEW

[l-spine ap]
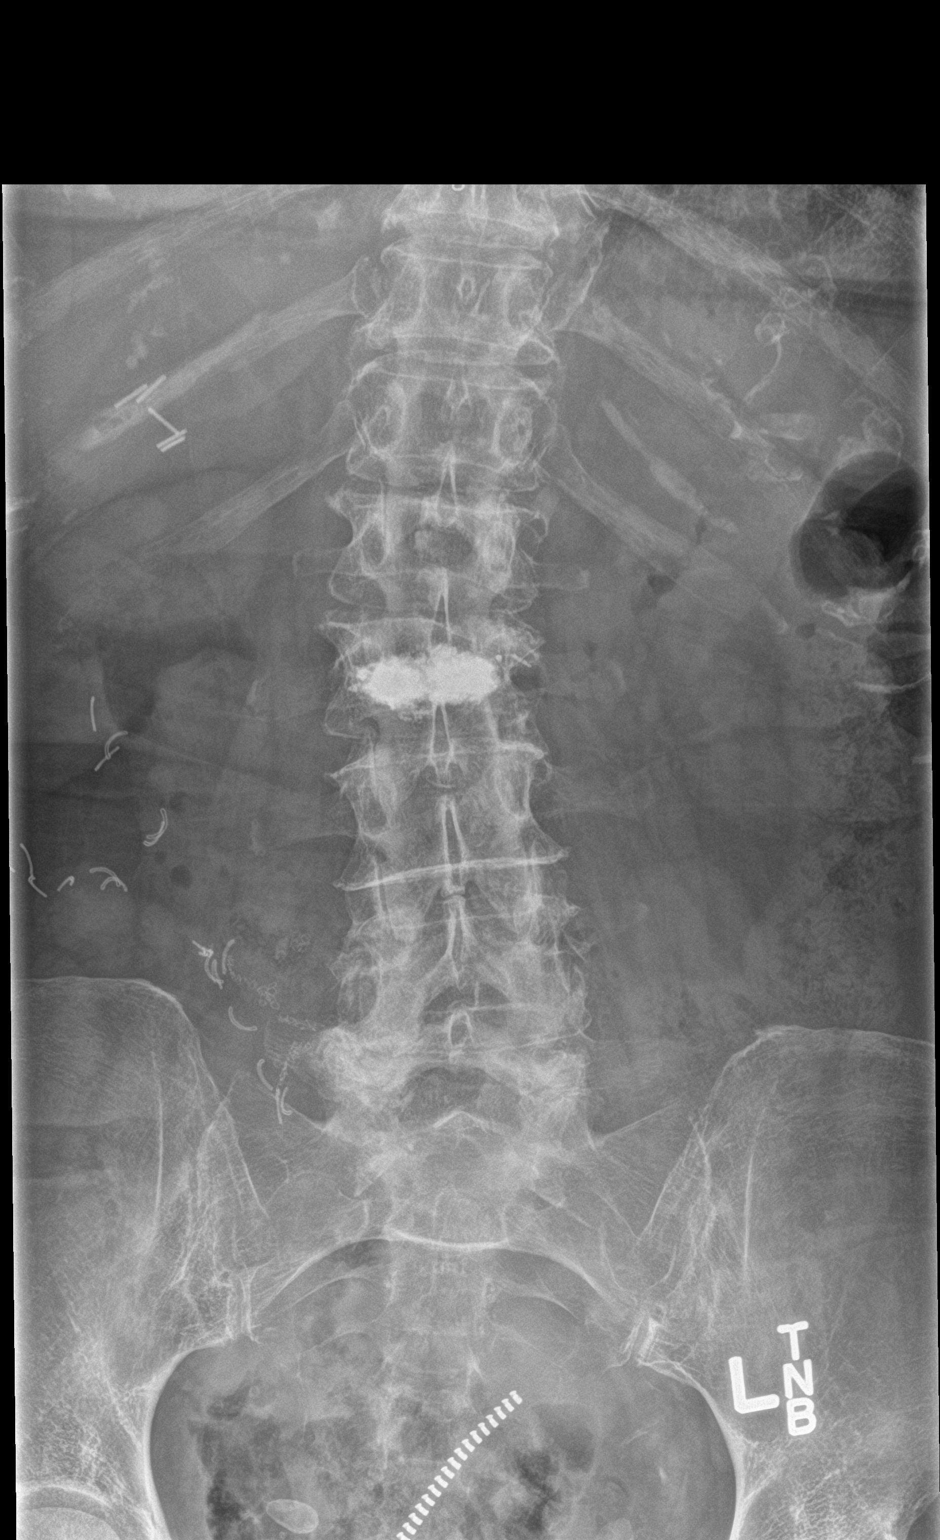

[l-spine lat]
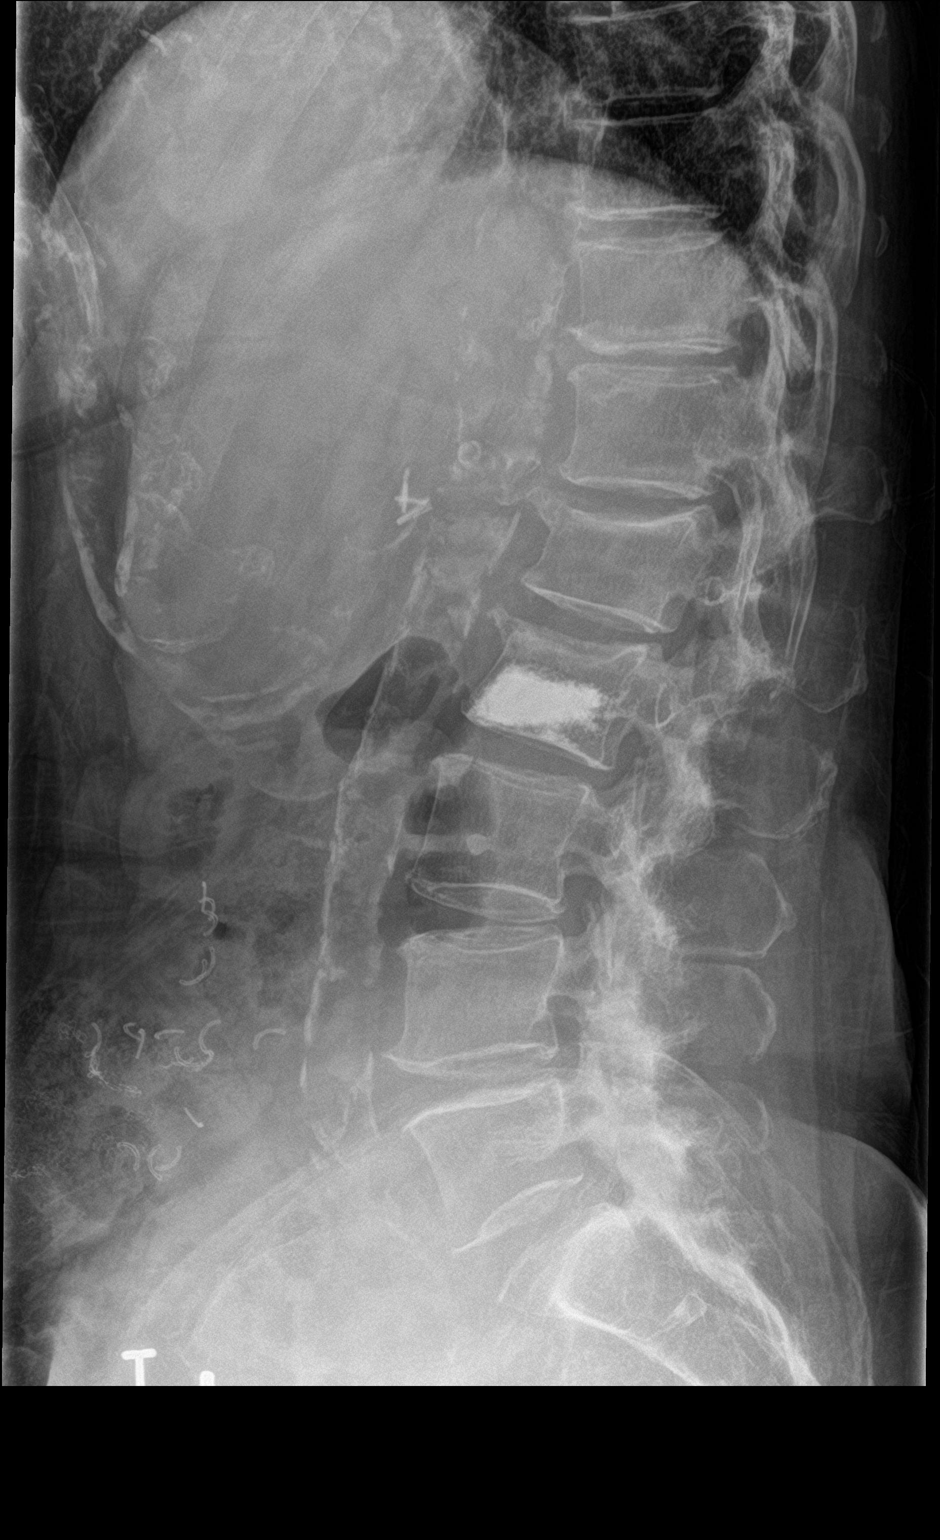

[l-spine spot]
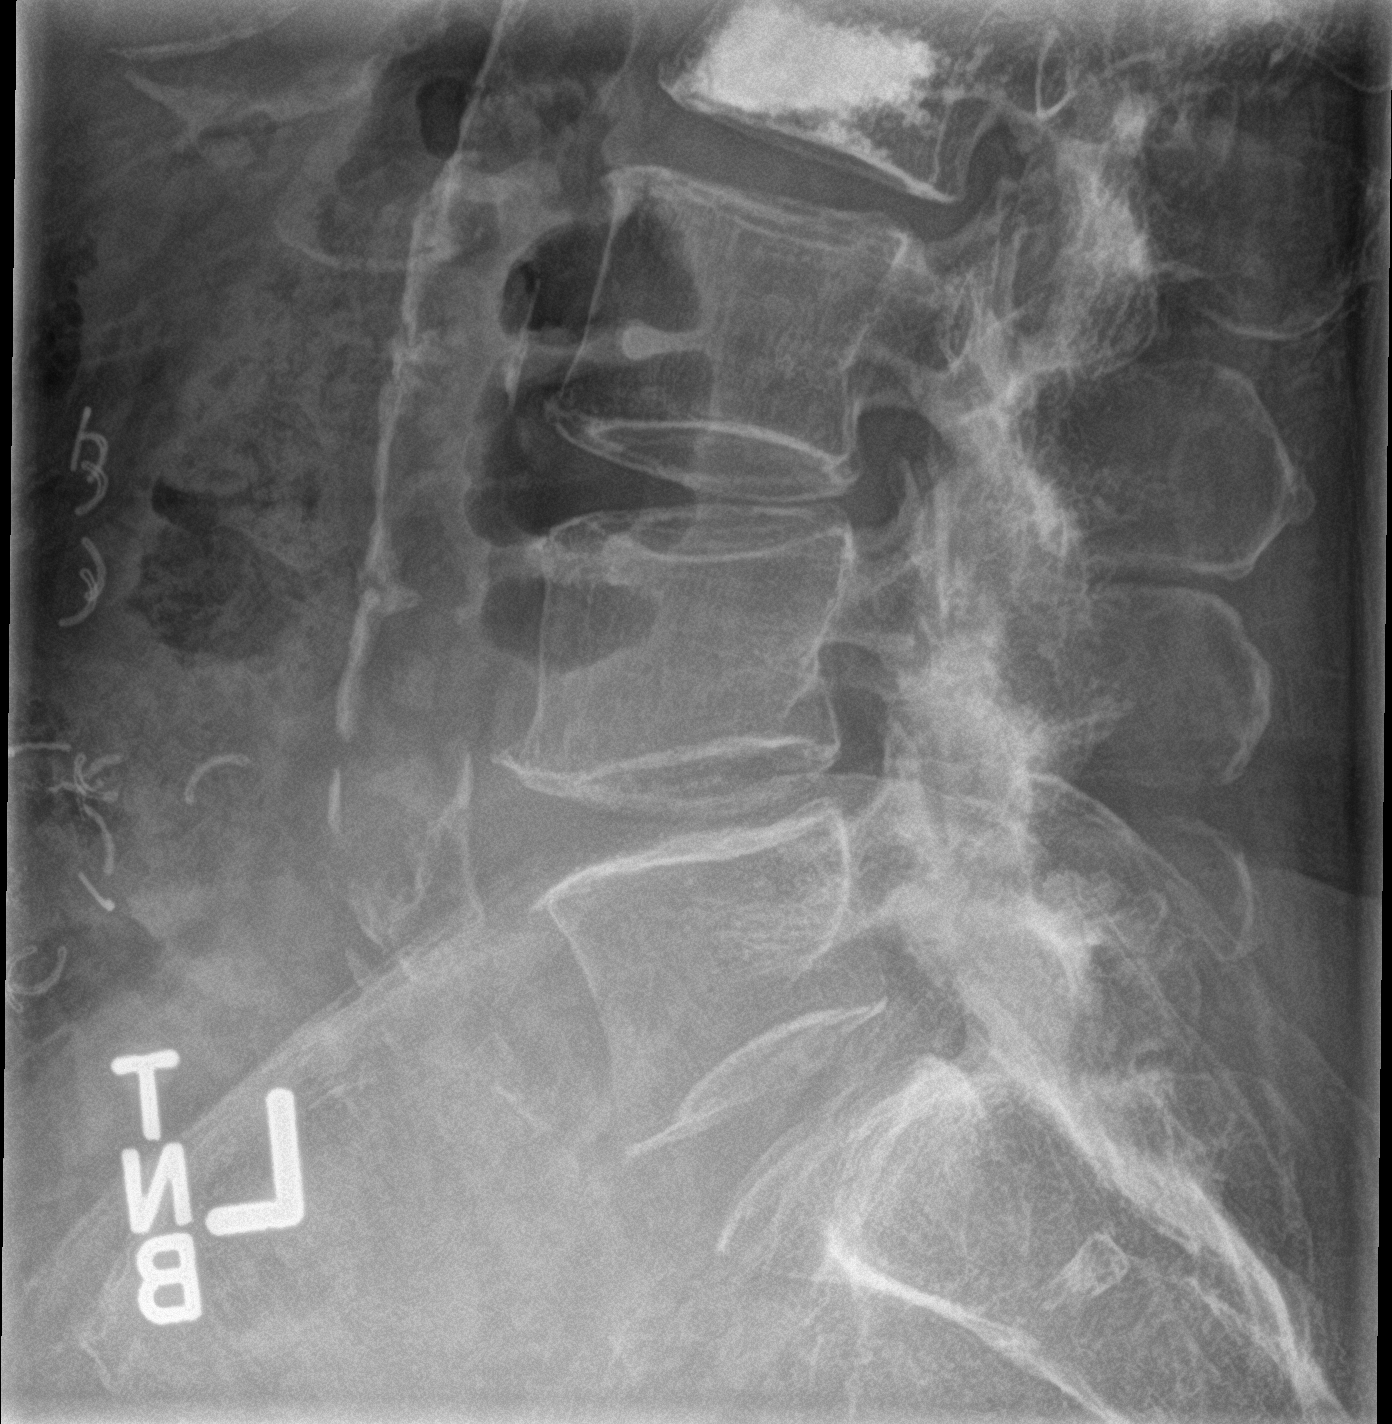

[3 of 3 positions shown; findings below may reference images not displayed]

FINDINGS: Mild scoliosis convex right.

Post kyphoplasty L2 with L2 superior endplate fracture appearing
similar to the prior MR.

Bidoo from the prior MR and kyphoplasty images is L1 superior endplate
compression fracture with mild anterior wedging (20% loss of height
anteriorly). Minimal posterior projection of posterosuperior aspect
of the compressed L1 vertebra.

Degenerative changes lower lumbar spine.

Prominent vascular calcifications.
IMPRESSION: Remote L2 compression fracture treated with cement augmentation.

L1 superior endplate compression fracture as detailed above is new
when compared with 05/25/2016 MR and 08/20/2016 kyphoplasty images.

Aortic Atherosclerosis ([id number]-NXA.A).

These results will be called to the ordering clinician or
representative by the Radiologist Assistant, and communication
documented in the PACS or zVision Dashboard.

## 2018-04-15 ENCOUNTER — Other Ambulatory Visit (INDEPENDENT_AMBULATORY_CARE_PROVIDER_SITE_OTHER): Payer: Self-pay | Admitting: Internal Medicine

## 2018-04-15 ENCOUNTER — Telehealth: Payer: Self-pay

## 2018-04-15 NOTE — Telephone Encounter (Signed)
Approved  for Eliquis  From 04/13/18 thru 54/56/2563   Application Case # [id number]

## 2018-04-19 ENCOUNTER — Telehealth: Payer: Self-pay

## 2018-04-19 NOTE — Telephone Encounter (Signed)
Pt notified her Eliquis arrived from [facility].She will send someone to pick up.They will call when they are on their way.Probably will be tomorrow (04/20/18)

## 2018-04-21 ENCOUNTER — Telehealth: Payer: Self-pay | Admitting: *Deleted

## 2018-04-21 NOTE — Telephone Encounter (Signed)

## 2018-04-22 NOTE — Telephone Encounter (Signed)
ERROR

## 2018-04-29 ENCOUNTER — Ambulatory Visit: Payer: Medicare Other | Admitting: Cardiovascular Disease

## 2018-05-24 DIAGNOSIS — Z6822 Body mass index (BMI) 22.0-22.9, adult: Secondary | ICD-10-CM | POA: Diagnosis not present

## 2018-05-24 DIAGNOSIS — I10 Essential (primary) hypertension: Secondary | ICD-10-CM | POA: Diagnosis not present

## 2018-05-24 DIAGNOSIS — G894 Chronic pain syndrome: Secondary | ICD-10-CM | POA: Diagnosis not present

## 2018-05-24 DIAGNOSIS — Z1389 Encounter for screening for other disorder: Secondary | ICD-10-CM | POA: Diagnosis not present

## 2018-06-18 ENCOUNTER — Other Ambulatory Visit: Payer: Self-pay | Admitting: Cardiovascular Disease

## 2018-06-27 ENCOUNTER — Ambulatory Visit (INDEPENDENT_AMBULATORY_CARE_PROVIDER_SITE_OTHER): Payer: Medicare Other | Admitting: Otolaryngology

## 2018-06-27 DIAGNOSIS — H903 Sensorineural hearing loss, bilateral: Secondary | ICD-10-CM | POA: Diagnosis not present

## 2018-06-27 DIAGNOSIS — H6123 Impacted cerumen, bilateral: Secondary | ICD-10-CM

## 2018-07-08 DIAGNOSIS — R42 Dizziness and giddiness: Secondary | ICD-10-CM | POA: Diagnosis not present

## 2018-07-11 DIAGNOSIS — M150 Primary generalized (osteo)arthritis: Secondary | ICD-10-CM | POA: Diagnosis not present

## 2018-07-11 DIAGNOSIS — M0589 Other rheumatoid arthritis with rheumatoid factor of multiple sites: Secondary | ICD-10-CM | POA: Diagnosis not present

## 2018-07-11 DIAGNOSIS — M5136 Other intervertebral disc degeneration, lumbar region: Secondary | ICD-10-CM | POA: Diagnosis not present

## 2018-07-11 DIAGNOSIS — Z79899 Other long term (current) drug therapy: Secondary | ICD-10-CM | POA: Diagnosis not present

## 2018-07-11 DIAGNOSIS — M549 Dorsalgia, unspecified: Secondary | ICD-10-CM | POA: Diagnosis not present

## 2018-07-12 DIAGNOSIS — M0589 Other rheumatoid arthritis with rheumatoid factor of multiple sites: Secondary | ICD-10-CM | POA: Diagnosis not present

## 2018-07-12 DIAGNOSIS — G894 Chronic pain syndrome: Secondary | ICD-10-CM | POA: Diagnosis not present

## 2018-07-12 DIAGNOSIS — Z6823 Body mass index (BMI) 23.0-23.9, adult: Secondary | ICD-10-CM | POA: Diagnosis not present

## 2018-07-12 DIAGNOSIS — R42 Dizziness and giddiness: Secondary | ICD-10-CM | POA: Diagnosis not present

## 2018-08-08 DIAGNOSIS — G894 Chronic pain syndrome: Secondary | ICD-10-CM | POA: Diagnosis not present

## 2018-08-22 ENCOUNTER — Ambulatory Visit (INDEPENDENT_AMBULATORY_CARE_PROVIDER_SITE_OTHER): Payer: Medicare Other | Admitting: Cardiovascular Disease

## 2018-08-22 ENCOUNTER — Encounter: Payer: Self-pay | Admitting: Cardiovascular Disease

## 2018-08-22 ENCOUNTER — Other Ambulatory Visit: Payer: Self-pay

## 2018-08-22 ENCOUNTER — Ambulatory Visit (HOSPITAL_COMMUNITY)
Admission: RE | Admit: 2018-08-22 | Discharge: 2018-08-22 | Disposition: A | Discharge status: Home / Self Care | Payer: Medicare Other | Source: Ambulatory Visit | Attending: Cardiovascular Disease | Admitting: Cardiovascular Disease

## 2018-08-22 ENCOUNTER — Other Ambulatory Visit (HOSPITAL_COMMUNITY)
Admission: RE | Admit: 2018-08-22 | Discharge: 2018-08-22 | Disposition: A | Discharge status: Home / Self Care | Payer: Medicare Other | Source: Ambulatory Visit | Attending: Cardiovascular Disease | Admitting: Cardiovascular Disease

## 2018-08-22 VITALS — BP 136/76 | HR 76 | Temp 97.3°F | Ht 62.0 in | Wt 120.0 lb

## 2018-08-22 DIAGNOSIS — R0602 Shortness of breath: Secondary | ICD-10-CM | POA: Diagnosis not present

## 2018-08-22 DIAGNOSIS — I6389 Other cerebral infarction: Secondary | ICD-10-CM | POA: Diagnosis not present

## 2018-08-22 DIAGNOSIS — I480 Paroxysmal atrial fibrillation: Secondary | ICD-10-CM

## 2018-08-22 DIAGNOSIS — I10 Essential (primary) hypertension: Secondary | ICD-10-CM | POA: Diagnosis not present

## 2018-08-22 DIAGNOSIS — R4701 Aphasia: Secondary | ICD-10-CM | POA: Diagnosis not present

## 2018-08-22 LAB — BASIC METABOLIC PANEL
Anion gap: 9 (ref 5–15)
BUN: 11 mg/dL (ref 8–23)
CO2: 26 mmol/L (ref 22–32)
Calcium: 8.7 mg/dL — ABNORMAL LOW (ref 8.9–10.3)
Chloride: 103 mmol/L (ref 98–111)
Creatinine, Ser: 0.54 mg/dL (ref 0.44–1.00)
GFR calc Af Amer: >60 mL/min (ref >60)
GFR calc non Af Amer: >60 mL/min (ref >60)
Glucose, Bld: 106 mg/dL — ABNORMAL HIGH (ref 70–99)
Potassium: 3.6 mmol/L (ref 3.5–5.1)
Sodium: 138 mmol/L (ref 135–145)

## 2018-08-22 LAB — CBC
HCT: 39.6 % (ref 36.0–46.0)
Hemoglobin: 12.6 g/dL (ref 12.0–15.0)
MCH: 28.1 pg (ref 26.0–34.0)
MCHC: 31.8 g/dL (ref 30.0–36.0)
MCV: 88.4 fL (ref 80.0–100.0)
Platelets: 205 10*3/uL (ref 150–400)
RBC: 4.48 MIL/uL (ref 3.87–5.11)
RDW: 14.8 % (ref 11.5–15.5)
WBC: 5.2 10*3/uL (ref 4.0–10.5)
nRBC: 0 % (ref 0.0–0.2)

## 2018-08-22 LAB — BRAIN NATRIURETIC PEPTIDE: B Natriuretic Peptide: 298 pg/mL — ABNORMAL HIGH (ref 0.0–100.0)

## 2018-08-22 NOTE — Patient Instructions (Addendum)
Medication Instructions: Your physician recommends that you continue on your current medications as directed. Please refer to the Current Medication list given to you today.   Labwork: Cbc,bmet, bnp today  Chest x-ray today  Procedures/Testing: Your physician has requested that you have an echocardiogram. Echocardiography is a painless test that uses sound waves to create images of your heart. It provides your doctor with information about the size and shape of your heart and how well your heart's chambers and valves are working. This procedure takes approximately one hour. There are no restrictions for this procedure.    Follow-Up: 2 months with Dr.Koneswaran  Any Additional Special Instructions Will Be Listed Below (If Applicable).     If you need a refill on your cardiac medications before your next appointment, please call your pharmacy.

## 2018-08-22 NOTE — Progress Notes (Signed)
SUBJECTIVE: The patient presents for routine follow-up. She has a history of paroxysmal atrial fibrillation and hypertension and takes apixaban for anticoagulation.   She is here with her daughter-in-law, Danton Clap, who usually comes to her appointments.  The patient experiences palpitations once or twice per week.  She denies leg swelling, orthopnea, and paroxysmal nocturnal dyspnea.  She denies chest pain.  She becomes short of breath with talking and with eating food.  She denies dysphagia for solids and liquids.  She complained of shortness of breath with talking at least 2-1/2 years ago at an office visit with me but this has been progressively getting worse.    Review of Systems: As per "subjective", otherwise negative.  Allergies  Allergen Reactions  . Iodinated Diagnostic Agents Anaphylaxis    08-12-10 Pt had severe reaction with itching/hives/loss of consciousness within 5 minutes of CEPI today.  She received both Kenalog and Iondinated Contrast, so not certain which agent caused reaction.  If patient returns for another CEPI, Dr. Jobe Igo considers using Decadron and a 13-hour prep.  jkl  . Nerve Block Tray Anaphylaxis    Patient states that she was sent to have Epidural for her shoulder. After rec'ving she remembers saying that she was itching all over  And that's it. She was transported to hospital  By EMS. Stayed overnight.  [place] Spikes Derivatives Hives and Nausea And Vomiting  . Triamcinolone Acetonide Anaphylaxis    08-12-10  Pt had severe reaction with itching/hives/loss of consciousness within five minutes of CEPI today.  She received both Kenalog and Iodinated Contrast, so not certain which agent caused reaction.  If pt returns for another cervical epidural steroid injection, Dr. Jobe Igo suggests using Decadron and a 13-hr prep.  JKL PATIENT USES PROCTOCREAM 2.5% WITHOUT DIFFICULTY FOR VULVAR DYSTROPHY Patient says she can take prednisone without any problem  . Vibra-Tab  [Doxycycline] Nausea And Vomiting    Current Outpatient Medications  Medication Sig Dispense Refill  . apixaban (ELIQUIS) 2.5 MG TABS tablet Take 1 tablet (2.5 mg total) by mouth 2 (two) times daily. 60 tablet 0  . HYDROcodone-acetaminophen (NORCO/VICODIN) 5-325 MG tablet Take 1 tablet by mouth every 6 (six) hours as needed.    . leflunomide (ARAVA) 20 MG tablet Take 20 mg by mouth daily.    . metoprolol succinate (TOPROL-XL) 50 MG 24 hr tablet TAKE 1 TABLET BY MOUTH TWICE A DAY 60 tablet 6  . omeprazole (PRILOSEC) 20 MG capsule TAKE ONE CAPSULE BY MOUTH TWICE DAILY. 60 capsule 11   No current facility-administered medications for this visit.     Past Medical History:  Diagnosis Date  . A-fib ([place])   . Atrial fibrillation ([place])   . Cervicalgia   . Colon cancer ([place])   . Degeneration of cervical intervertebral disc   . Hypertension   . Pinched nerve in shoulder   . Rheumatoid arthritis(714.0)   . RUQ pain   . Vulvar dystrophy   . Yeast infection     Past Surgical History:  Procedure Laterality Date  . ABDOMINAL HYSTERECTOMY  1960'S  . APPENDECTOMY  with hysterectomy  . BACK SURGERY  12/08/10   plate/screws C3 C4  . CHOLECYSTECTOMY  2002  . COLONOSCOPY  04/02/2009  . COLONOSCOPY  06/04/05  . COLONOSCOPY N/A 07/26/2015   Procedure: COLONOSCOPY;  Surgeon: Rogene Houston, MD;  Location: [facility];  Service: Endoscopy;  Laterality: N/A;  1240  . COLONOSCOPY WITH ESOPHAGOGASTRODUODENOSCOPY (EGD) N/A 04/08/2012   Procedure:  COLONOSCOPY WITH ESOPHAGOGASTRODUODENOSCOPY (EGD);  Surgeon: Rogene Houston, MD;  Location: [facility];  Service: Endoscopy;  Laterality: N/A;  130-moved to 245 Ann notified pt  . ESOPHAGOGASTRODUODENOSCOPY N/A 07/26/2015   Procedure: ESOPHAGOGASTRODUODENOSCOPY (EGD);  Surgeon: Rogene Houston, MD;  Location: [facility];  Service: Endoscopy;  Laterality: N/A;  . HEMICOLECTOMY  10/05    Social History   Socioeconomic History  . Marital status:  Married    Spouse name: Not on file  . Number of children: Not on file  . Years of education: Not on file  . Highest education level: Not on file  Occupational History  . Not on file  Social Needs  . Financial resource strain: Not on file  . Food insecurity    Worry: Not on file    Inability: Not on file  . Transportation needs    Medical: Not on file    Non-medical: Not on file  Tobacco Use  . Smoking status: Never Smoker  . Smokeless tobacco: Never Used  Substance and Sexual Activity  . Alcohol use: No  . Drug use: No  . Sexual activity: Never  Lifestyle  . Physical activity    Days per week: Not on file    Minutes per session: Not on file  . Stress: Not on file  Relationships  . Social Herbalist on phone: Not on file    Gets together: Not on file    Attends religious service: Not on file    Active member of club or organization: Not on file    Attends meetings of clubs or organizations: Not on file    Relationship status: Not on file  . Intimate partner violence    Fear of current or ex partner: Not on file    Emotionally abused: Not on file    Physically abused: Not on file    Forced sexual activity: Not on file  Other Topics Concern  . Not on file  Social History Narrative  . Not on file     Vitals:   08/22/18 0816  BP: 136/76  Pulse: 76  Temp: (!) 97.3 F (36.3 C)  TempSrc: Temporal  SpO2: 93%  Weight: 120 lb (54.4 kg)  Height: 5\' 2"  (1.575 m)    Wt Readings from Last 3 Encounters:  08/22/18 120 lb (54.4 kg)  04/07/17 119 lb (54 kg)  02/24/17 185 lb 13.6 oz (84.3 kg)     PHYSICAL EXAM General: NAD HEENT: Normal. Neck: No JVD, no thyromegaly. Lungs: Bilateral crackles and faint rhonchi about halfway up. CV: Regular rate and rhythm, normal S1/S2, no S3/S4, no murmur. No pretibial or periankle edema. Abdomen: Soft, nontender, no distention.  Neurologic: Alert and oriented.  Psych: Normal affect. Skin: Normal. Musculoskeletal: No  gross deformities.    ECG: Reviewed above under Subjective   Labs: Lab Results  Component Value Date/Time   K 4.2 02/24/2017 05:52 AM   BUN 35 (H) 02/24/2017 05:52 AM   CREATININE 0.69 02/24/2017 05:52 AM   CREATININE 0.65 08/10/2013 02:07 PM   ALT 16 02/22/2017 09:23 AM   TSH 0.441 02/22/2017 04:40 PM   TSH 0.873 07/04/2013 04:39 PM   HGB 10.9 (L) 02/24/2017 05:52 AM     Lipids: Lab Results  Component Value Date/Time   LDLCALC (H) 08/25/2008 04:40 AM    140        Total Cholesterol/HDL:CHD Risk Coronary Heart Disease Risk Table  Men   Women  1/2 Average Risk   3.4   3.3  Average Risk       5.0   4.4  2 X Average Risk   9.6   7.1  3 X Average Risk  23.4   11.0        Use the calculated Patient Ratio above and the CHD Risk Table to determine the patient's CHD Risk.        ATP III CLASSIFICATION (LDL):  <100     mg/dL   Optimal  100-129  mg/dL   Near or Above                    Optimal  130-159  mg/dL   Borderline  160-189  mg/dL   High  >190     mg/dL   Very High   CHOL (H) 08/25/2008 04:40 AM    209        ATP III CLASSIFICATION:  <200     mg/dL   Desirable  200-239  mg/dL   Borderline High  >=240    mg/dL   High          TRIG 126 08/25/2008 04:40 AM   HDL 44 08/25/2008 04:40 AM       ASSESSMENT AND PLAN: 1. Paroxysmal atrial fibrillation: Symptoms fairly well controlled on Toprol-XL 50 mg twice daily. Continue Eliquis 2.5 mg twice daily for systemic anticoagulation.   2. Essential HTN:  Blood pressure is normal.  No changes to therapy.  3.  Shortness of breath: Abnormal lung sounds on exam.  I will obtain a chest x-ray, BNP, CBC, and basic metabolic panel.  I will also obtain an echocardiogram to evaluate cardiac structure and function.    Disposition: Follow up 2 months   Kate Sable, M.D., F.A.C.C.

## 2018-08-23 ENCOUNTER — Ambulatory Visit (HOSPITAL_BASED_OUTPATIENT_CLINIC_OR_DEPARTMENT_OTHER)
Admission: RE | Admit: 2018-08-23 | Discharge: 2018-08-23 | Disposition: A | Discharge status: Home / Self Care | Payer: Medicare Other | Source: Ambulatory Visit | Attending: Cardiovascular Disease | Admitting: Cardiovascular Disease

## 2018-08-23 DIAGNOSIS — I480 Paroxysmal atrial fibrillation: Secondary | ICD-10-CM

## 2018-08-23 NOTE — Progress Notes (Signed)
*  PRELIMINARY RESULTS* Echocardiogram 2D Echocardiogram has been performed.  [name] 08/23/2018, 12:08 PM

## 2018-08-25 ENCOUNTER — Observation Stay (HOSPITAL_COMMUNITY): Payer: Medicare Other

## 2018-08-25 ENCOUNTER — Encounter (HOSPITAL_COMMUNITY): Payer: Self-pay

## 2018-08-25 ENCOUNTER — Other Ambulatory Visit: Payer: Self-pay

## 2018-08-25 ENCOUNTER — Emergency Department (HOSPITAL_COMMUNITY): Payer: Medicare Other

## 2018-08-25 ENCOUNTER — Inpatient Hospital Stay (HOSPITAL_COMMUNITY)
Admission: EM | Admit: 2018-08-25 | Discharge: 2018-08-31 | DRG: 065 | Disposition: A | Discharge status: Skilled Nursing Facility | Payer: Medicare Other | Attending: Internal Medicine | Admitting: Internal Medicine

## 2018-08-25 DIAGNOSIS — I6389 Other cerebral infarction: Secondary | ICD-10-CM | POA: Diagnosis not present

## 2018-08-25 DIAGNOSIS — Z884 Allergy status to anesthetic agent status: Secondary | ICD-10-CM

## 2018-08-25 DIAGNOSIS — Z8261 Family history of arthritis: Secondary | ICD-10-CM

## 2018-08-25 DIAGNOSIS — R0902 Hypoxemia: Secondary | ICD-10-CM | POA: Diagnosis not present

## 2018-08-25 DIAGNOSIS — Z888 Allergy status to other drugs, medicaments and biological substances status: Secondary | ICD-10-CM

## 2018-08-25 DIAGNOSIS — Z7901 Long term (current) use of anticoagulants: Secondary | ICD-10-CM

## 2018-08-25 DIAGNOSIS — H9190 Unspecified hearing loss, unspecified ear: Secondary | ICD-10-CM | POA: Diagnosis not present

## 2018-08-25 DIAGNOSIS — R279 Unspecified lack of coordination: Secondary | ICD-10-CM | POA: Diagnosis not present

## 2018-08-25 DIAGNOSIS — G8929 Other chronic pain: Secondary | ICD-10-CM | POA: Diagnosis not present

## 2018-08-25 DIAGNOSIS — R262 Difficulty in walking, not elsewhere classified: Secondary | ICD-10-CM | POA: Diagnosis not present

## 2018-08-25 DIAGNOSIS — I10 Essential (primary) hypertension: Secondary | ICD-10-CM | POA: Diagnosis not present

## 2018-08-25 DIAGNOSIS — I959 Hypotension, unspecified: Secondary | ICD-10-CM | POA: Diagnosis not present

## 2018-08-25 DIAGNOSIS — E785 Hyperlipidemia, unspecified: Secondary | ICD-10-CM | POA: Diagnosis not present

## 2018-08-25 DIAGNOSIS — M5090 Cervical disc disorder, unspecified, unspecified cervical region: Secondary | ICD-10-CM | POA: Diagnosis not present

## 2018-08-25 DIAGNOSIS — G9389 Other specified disorders of brain: Secondary | ICD-10-CM | POA: Diagnosis present

## 2018-08-25 DIAGNOSIS — G9349 Other encephalopathy: Secondary | ICD-10-CM | POA: Diagnosis not present

## 2018-08-25 DIAGNOSIS — G8194 Hemiplegia, unspecified affecting left nondominant side: Secondary | ICD-10-CM | POA: Diagnosis present

## 2018-08-25 DIAGNOSIS — I4811 Longstanding persistent atrial fibrillation: Secondary | ICD-10-CM

## 2018-08-25 DIAGNOSIS — Z881 Allergy status to other antibiotic agents status: Secondary | ICD-10-CM

## 2018-08-25 DIAGNOSIS — Z820 Family history of epilepsy and other diseases of the nervous system: Secondary | ICD-10-CM | POA: Diagnosis not present

## 2018-08-25 DIAGNOSIS — R4701 Aphasia: Secondary | ICD-10-CM | POA: Diagnosis not present

## 2018-08-25 DIAGNOSIS — G4733 Obstructive sleep apnea (adult) (pediatric): Secondary | ICD-10-CM | POA: Diagnosis not present

## 2018-08-25 DIAGNOSIS — M069 Rheumatoid arthritis, unspecified: Secondary | ICD-10-CM | POA: Diagnosis not present

## 2018-08-25 DIAGNOSIS — R471 Dysarthria and anarthria: Secondary | ICD-10-CM | POA: Diagnosis not present

## 2018-08-25 DIAGNOSIS — Z20828 Contact with and (suspected) exposure to other viral communicable diseases: Secondary | ICD-10-CM | POA: Diagnosis present

## 2018-08-25 DIAGNOSIS — M542 Cervicalgia: Secondary | ICD-10-CM | POA: Diagnosis not present

## 2018-08-25 DIAGNOSIS — Z91041 Radiographic dye allergy status: Secondary | ICD-10-CM | POA: Diagnosis not present

## 2018-08-25 DIAGNOSIS — G459 Transient cerebral ischemic attack, unspecified: Secondary | ICD-10-CM | POA: Diagnosis not present

## 2018-08-25 DIAGNOSIS — R29700 NIHSS score 0: Secondary | ICD-10-CM | POA: Diagnosis not present

## 2018-08-25 DIAGNOSIS — Z79899 Other long term (current) drug therapy: Secondary | ICD-10-CM | POA: Diagnosis not present

## 2018-08-25 DIAGNOSIS — M6281 Muscle weakness (generalized): Secondary | ICD-10-CM | POA: Diagnosis not present

## 2018-08-25 DIAGNOSIS — I4891 Unspecified atrial fibrillation: Secondary | ICD-10-CM | POA: Diagnosis present

## 2018-08-25 DIAGNOSIS — R51 Headache: Secondary | ICD-10-CM | POA: Diagnosis not present

## 2018-08-25 DIAGNOSIS — I482 Chronic atrial fibrillation, unspecified: Secondary | ICD-10-CM | POA: Diagnosis present

## 2018-08-25 DIAGNOSIS — R0602 Shortness of breath: Secondary | ICD-10-CM | POA: Diagnosis not present

## 2018-08-25 DIAGNOSIS — Z79891 Long term (current) use of opiate analgesic: Secondary | ICD-10-CM | POA: Diagnosis not present

## 2018-08-25 DIAGNOSIS — Z993 Dependence on wheelchair: Secondary | ICD-10-CM | POA: Diagnosis not present

## 2018-08-25 DIAGNOSIS — R05 Cough: Secondary | ICD-10-CM | POA: Diagnosis not present

## 2018-08-25 DIAGNOSIS — R410 Disorientation, unspecified: Secondary | ICD-10-CM | POA: Diagnosis not present

## 2018-08-25 DIAGNOSIS — I639 Cerebral infarction, unspecified: Secondary | ICD-10-CM | POA: Diagnosis not present

## 2018-08-25 DIAGNOSIS — I63231 Cerebral infarction due to unspecified occlusion or stenosis of right carotid arteries: Secondary | ICD-10-CM | POA: Diagnosis not present

## 2018-08-25 DIAGNOSIS — I6521 Occlusion and stenosis of right carotid artery: Secondary | ICD-10-CM | POA: Diagnosis not present

## 2018-08-25 DIAGNOSIS — R296 Repeated falls: Secondary | ICD-10-CM | POA: Diagnosis not present

## 2018-08-25 LAB — SARS CORONAVIRUS 2 BY RT PCR (HOSPITAL ORDER, PERFORMED IN ~~LOC~~ HOSPITAL LAB): SARS Coronavirus 2: NEGATIVE

## 2018-08-25 LAB — CBC
HCT: 40.4 % (ref 36.0–46.0)
Hemoglobin: 12.7 g/dL (ref 12.0–15.0)
MCH: 27.7 pg (ref 26.0–34.0)
MCHC: 31.4 g/dL (ref 30.0–36.0)
MCV: 88 fL (ref 80.0–100.0)
Platelets: 195 10*3/uL (ref 150–400)
RBC: 4.59 MIL/uL (ref 3.87–5.11)
RDW: 15.1 % (ref 11.5–15.5)
WBC: 5.5 10*3/uL (ref 4.0–10.5)
nRBC: 0 % (ref 0.0–0.2)

## 2018-08-25 LAB — COMPREHENSIVE METABOLIC PANEL
ALT: 13 U/L (ref 0–44)
AST: 22 U/L (ref 15–41)
Albumin: 3.7 g/dL (ref 3.5–5.0)
Alkaline Phosphatase: 60 U/L (ref 38–126)
Anion gap: 9 (ref 5–15)
BUN: 11 mg/dL (ref 8–23)
CO2: 27 mmol/L (ref 22–32)
Calcium: 8.6 mg/dL — ABNORMAL LOW (ref 8.9–10.3)
Chloride: 104 mmol/L (ref 98–111)
Creatinine, Ser: 0.63 mg/dL (ref 0.44–1.00)
GFR calc Af Amer: >60 mL/min (ref >60)
GFR calc non Af Amer: >60 mL/min (ref >60)
Glucose, Bld: 101 mg/dL — ABNORMAL HIGH (ref 70–99)
Potassium: 4 mmol/L (ref 3.5–5.1)
Sodium: 140 mmol/L (ref 135–145)
Total Bilirubin: 0.6 mg/dL (ref 0.3–1.2)
Total Protein: 6.9 g/dL (ref 6.5–8.1)

## 2018-08-25 LAB — URINALYSIS, ROUTINE W REFLEX MICROSCOPIC
Bilirubin Urine: NEGATIVE
Glucose, UA: NEGATIVE mg/dL
Hgb urine dipstick: NEGATIVE
Ketones, ur: NEGATIVE mg/dL
Leukocytes,Ua: NEGATIVE
Nitrite: NEGATIVE
Protein, ur: NEGATIVE mg/dL
Specific Gravity, Urine: 1.004 — ABNORMAL LOW (ref 1.005–1.030)
pH: 6 (ref 5.0–8.0)

## 2018-08-25 LAB — TROPONIN I (HIGH SENSITIVITY)
Troponin I (High Sensitivity): 24 ng/L — ABNORMAL HIGH (ref <18)
Troponin I (High Sensitivity): 35 ng/L — ABNORMAL HIGH (ref <18)

## 2018-08-25 MED ORDER — LABETALOL HCL 5 MG/ML IV SOLN: 10.0000 mg | INTRAVENOUS | Status: DC | PRN

## 2018-08-25 MED ORDER — ACETAMINOPHEN 325 MG PO TABS
650.0000 mg | ORAL_TABLET | Freq: Four times a day (QID) | ORAL | Status: DC | PRN
  Administered 2018-08-25 – 2018-08-29 (×2): 650 mg via ORAL
  Filled 2018-08-25 (×2): qty 2

## 2018-08-25 MED ORDER — ASPIRIN 325 MG PO TABS
325.0000 mg | ORAL_TABLET | Freq: Once | ORAL | Status: AC
  Administered 2018-08-25: 13:00:00 325 mg via ORAL
  Filled 2018-08-25: qty 1

## 2018-08-25 MED ORDER — ACETAMINOPHEN 650 MG RE SUPP: 650.0000 mg | Freq: Four times a day (QID) | RECTAL | Status: DC | PRN

## 2018-08-25 MED ORDER — TRAZODONE HCL 50 MG PO TABS
50.0000 mg | ORAL_TABLET | Freq: Every evening | ORAL | Status: DC | PRN
  Administered 2018-08-25 – 2018-08-30 (×4): 50 mg via ORAL
  Filled 2018-08-25 (×4): qty 1

## 2018-08-25 MED ORDER — SODIUM CHLORIDE 0.9% FLUSH
3.0000 mL | Freq: Two times a day (BID) | INTRAVENOUS | Status: DC
  Administered 2018-08-25 – 2018-08-31 (×11): 3 mL via INTRAVENOUS

## 2018-08-25 MED ORDER — IBUPROFEN 600 MG PO TABS
600.0000 mg | ORAL_TABLET | Freq: Once | ORAL | Status: AC
  Administered 2018-08-25: 600 mg via ORAL
  Filled 2018-08-25: qty 1

## 2018-08-25 MED ORDER — LEFLUNOMIDE 20 MG PO TABS
20.0000 mg | ORAL_TABLET | Freq: Every day | ORAL | Status: DC
  Administered 2018-08-26 – 2018-08-31 (×6): 20 mg via ORAL
  Filled 2018-08-25 (×9): qty 1

## 2018-08-25 MED ORDER — POLYETHYLENE GLYCOL 3350 17 G PO PACK: 17.0000 g | PACK | Freq: Every day | ORAL | Status: DC | PRN

## 2018-08-25 MED ORDER — PANTOPRAZOLE SODIUM 40 MG PO TBEC
40.0000 mg | DELAYED_RELEASE_TABLET | Freq: Every day | ORAL | Status: DC
  Administered 2018-08-26 – 2018-08-31 (×6): 40 mg via ORAL
  Filled 2018-08-25 (×6): qty 1

## 2018-08-25 MED ORDER — VITAMIN D 25 MCG (1000 UNIT) PO TABS
2000.0000 [IU] | ORAL_TABLET | Freq: Two times a day (BID) | ORAL | Status: DC
  Administered 2018-08-25 – 2018-08-31 (×12): 2000 [IU] via ORAL
  Filled 2018-08-25 (×12): qty 2

## 2018-08-25 MED ORDER — ONDANSETRON HCL 4 MG/2ML IJ SOLN: 4.0000 mg | Freq: Four times a day (QID) | INTRAMUSCULAR | Status: DC | PRN

## 2018-08-25 MED ORDER — APIXABAN 2.5 MG PO TABS
2.5000 mg | ORAL_TABLET | Freq: Two times a day (BID) | ORAL | Status: DC
  Administered 2018-08-25 – 2018-08-31 (×12): 2.5 mg via ORAL
  Filled 2018-08-25 (×12): qty 1

## 2018-08-25 MED ORDER — ATORVASTATIN CALCIUM 40 MG PO TABS
80.0000 mg | ORAL_TABLET | Freq: Every day | ORAL | Status: DC
  Administered 2018-08-25 – 2018-08-29 (×5): 80 mg via ORAL
  Filled 2018-08-25 (×5): qty 2

## 2018-08-25 MED ORDER — SODIUM CHLORIDE 0.9 % IV SOLN: 250.0000 mL | INTRAVENOUS | Status: DC | PRN

## 2018-08-25 MED ORDER — SODIUM CHLORIDE 0.9% FLUSH
3.0000 mL | INTRAVENOUS | Status: DC | PRN
  Administered 2018-08-30: 3 mL via INTRAVENOUS
  Filled 2018-08-25: qty 3

## 2018-08-25 MED ORDER — GADOBUTROL 1 MMOL/ML IV SOLN
5.0000 mL | Freq: Once | INTRAVENOUS | Status: AC | PRN
  Administered 2018-08-25: 5 mL via INTRAVENOUS

## 2018-08-25 MED ORDER — HYDROCODONE-ACETAMINOPHEN 5-325 MG PO TABS
1.0000 | ORAL_TABLET | Freq: Four times a day (QID) | ORAL | Status: DC | PRN
  Administered 2018-08-25 – 2018-08-31 (×12): 1 via ORAL
  Filled 2018-08-25 (×13): qty 1

## 2018-08-25 MED ORDER — METOPROLOL SUCCINATE ER 50 MG PO TB24
50.0000 mg | ORAL_TABLET | Freq: Two times a day (BID) | ORAL | Status: DC
  Administered 2018-08-25 – 2018-08-26 (×2): 50 mg via ORAL
  Filled 2018-08-25 (×2): qty 1

## 2018-08-25 MED ORDER — METOPROLOL TARTRATE 5 MG/5ML IV SOLN
2.5000 mg | Freq: Once | INTRAVENOUS | Status: AC
  Administered 2018-08-25: 2.5 mg via INTRAVENOUS
  Filled 2018-08-25: qty 5

## 2018-08-25 MED ORDER — ONDANSETRON HCL 4 MG PO TABS: 4.0000 mg | ORAL_TABLET | Freq: Four times a day (QID) | ORAL | Status: DC | PRN

## 2018-08-25 NOTE — H&P (Signed)
Patient Demographics:    [name], is a 85 y.o. female  MRN: [id number]   DOB - Apr 18, 1933  Admit Date - 08/25/2018  Outpatient Primary MD for the patient is [name], MD   Assessment & Plan:    Principal Problem:   CVA (cerebral vascular accident) Acute subcentimeter infarction in the right Corpus Callosum Active Problems:   Chronic anticoagulation/Eliquis for Afib   Atrial fibrillation (HCC)   HLD (hyperlipidemia)   Essential hypertension   Acute CVA (cerebrovascular accident) ([place])    1)Acute CVA -right corpus callosum CVA with expressive aphasia that is now resolved-- place on telemetry monitored unit, treat empirically with  atorvastatin,  -Patient received aspirin in the ED -Continue Eliquis 2.5 mg twice daily in the setting of A. Fib -Await neurology input regarding adjustment to anticoagulation regimen -- MRI brain shows acute subcentimeter infarction in the right MRA head and neck without LVO -echocardiogram from 08/23/2018 with preserved EF of 60 to 65% without acute findings.  -Telemetry neurology consult appreciated In-house neurology consult pending.   PT/OT eval pending,  -Patient passed bedside swallow eval  we will allow some permissive hypertension in view of possible acute stroke, avoid precipitous drop in blood pressure. Use Hydralazine 10mg  or Labetalol 10 mg iv every 4 hrs as needed for systolic blood pressure over 210 mmhg , check TSH, A1c and fasting lipid profile,  -A1c pending -  2) chronic atrial fibrillation--continue Eliquis 2.5 mg twice daily for secondary stroke prevention--await neurology input regarding adjustment to anticoagulation regimen -Continue Toprol-XL for rate control 50 mg twice daily  3)RA---At baseline she is wheelchair-bound/limited due to previous  back surgeries and rheumatoid arthritis -Continue Arava  4) social/ethics--- plan of care discussed with patient and daughter [name] at bedside, she is a full code  With History of - Reviewed by me  Past Medical History:  Diagnosis Date   A-fib ([place])    Atrial fibrillation ([place])    Cervicalgia    Colon cancer ([place])    Degeneration of cervical intervertebral disc    Hypertension    Pinched nerve in shoulder    Rheumatoid arthritis(714.0)    RUQ pain    Vulvar dystrophy    Yeast infection       Past Surgical History:  Procedure Laterality Date   ABDOMINAL HYSTERECTOMY  1960'S   APPENDECTOMY  with hysterectomy   BACK SURGERY  12/08/10   plate/screws C3 C4   CHOLECYSTECTOMY  2002   COLONOSCOPY  04/02/2009   COLONOSCOPY  06/04/05   COLONOSCOPY N/A 07/26/2015   Procedure: COLONOSCOPY;  Surgeon: Rogene Houston, MD;  [facility]: [facility];  Service: Endoscopy;  Laterality: N/A;  1240   COLONOSCOPY WITH ESOPHAGOGASTRODUODENOSCOPY (EGD) N/A 04/08/2012   Procedure: COLONOSCOPY WITH ESOPHAGOGASTRODUODENOSCOPY (EGD);  Surgeon: Rogene Houston, MD;  Location: [facility];  Service: Endoscopy;  Laterality: N/A;  130-moved to 245 Ann notified pt   ESOPHAGOGASTRODUODENOSCOPY N/A 07/26/2015  Procedure: ESOPHAGOGASTRODUODENOSCOPY (EGD);  Surgeon: Rogene Houston, MD;  Location: [facility];  Service: Endoscopy;  Laterality: N/A;   HEMICOLECTOMY  10/05    Chief Complaint  Patient presents with   expressive aphasia      HPI:    [name]  is a 85 y.o. female with past medical history relevant for chronic atrial fibrillation, HTN,, HLD, rheumatoid arthritis presented by EMS after episode of speech disturbance and altered mentation lasting 15 to 20 minutes -Patient's son who lives in the [place] called patient in the morning as she usually does every morning--- noticed that patient speech was slurred and patient was somewhat disoriented during his  conversation with patient --he contacted patient's daughter who drove there patient speech was still slurred and patient was disoriented, EMS was called when EMS arrived patient was back to normal -Patient complains of headache to the right side of the face and head which is not new, slight nausea and visual disturbance -- -In the ED patient had a telemetry neurology consult MRI brain shows acute subcentimeter infarction in the right MRA head and neck without LVO splenium of the corpus callosum which could be symptomatic  -At baseline patient is mostly wheelchair dependent  --She passed a bedside swallow test  Echocardiogram from 08/23/2018 with EF of 60 to 65% without significant abnormalities otherwise -Troponins noted  -At baseline she is wheelchair-bound/limited due to previous back surgeries and rheumatoid arthritis  --Additional history obtained from patient's daughter [name] at bedside RN Stanton Kidney present at bedside as well   Review of systems:    In addition to the HPI above,   A full Review of  Systems was done, all other systems reviewed are negative except as noted above in HPI , .    Social History:  Reviewed by me    Social History   Tobacco Use   Smoking status: Never Smoker   Smokeless tobacco: Never Used  Substance Use Topics   Alcohol use: No     Family History :  Reviewed by me    Family History  Problem Relation Age of Onset   Rheum arthritis Mother    Parkinsonism Father    Hiatal hernia Sister    Healthy Brother    Healthy Daughter    Healthy Son    Healthy Son      Home Medications:   Prior to Admission medications   Medication Sig Start Date End Date Taking? Authorizing Provider  apixaban (ELIQUIS) 2.5 MG TABS tablet Take 1 tablet (2.5 mg total) by mouth 2 (two) times daily. 02/24/17 08/25/18 Yes Johnson, Clanford L, MD  cholecalciferol (VITAMIN D3) 25 MCG (1000 UT) tablet Take 2,000 Units by mouth 2 (two) times daily.   Yes  [provider]  HYDROcodone-acetaminophen (NORCO/VICODIN) 5-325 MG tablet Take 1 tablet by mouth every 6 (six) hours as needed.   Yes [provider]  leflunomide (ARAVA) 20 MG tablet Take 20 mg by mouth daily.   Yes [provider]  metoprolol succinate (TOPROL-XL) 50 MG 24 hr tablet TAKE 1 TABLET BY MOUTH TWICE A DAY 06/20/18  Yes Herminio Commons, MD  omeprazole (PRILOSEC) 20 MG capsule TAKE ONE CAPSULE BY MOUTH TWICE DAILY. 04/18/18  Yes Setzer, Rona Ravens, NP     Allergies:     Allergies  Allergen Reactions   Iodinated Diagnostic Agents Anaphylaxis    08-12-10 Pt had severe reaction with itching/hives/loss of consciousness within 5 minutes of CEPI today.  She received  both Kenalog and Iondinated Contrast, so not certain which agent caused reaction.  If patient returns for another CEPI, Dr. Jobe Igo considers using Decadron and a 13-hour prep.  jkl   Nerve Block Tray Anaphylaxis    Patient states that she was sent to have Epidural for her shoulder. After rec'ving she remembers saying that she was itching all over  And that's it. She was transported to hospital  By EMS. Stayed overnight.   Nitrofuran Derivatives Hives and Nausea And Vomiting   Triamcinolone Acetonide Anaphylaxis    08-12-10  Pt had severe reaction with itching/hives/loss of consciousness within five minutes of CEPI today.  She received both Kenalog and Iodinated Contrast, so not certain which agent caused reaction.  If pt returns for another cervical epidural steroid injection, Dr. Jobe Igo suggests using Decadron and a 13-hr prep.  JKL PATIENT USES PROCTOCREAM 2.5% WITHOUT DIFFICULTY FOR VULVAR DYSTROPHY Patient says she can take prednisone without any problem   Vibra-Tab [Doxycycline] Nausea And Vomiting     Physical Exam:   Vitals  Blood pressure (!) 142/73, pulse (!) 105, temperature 98.2 F (36.8 C), temperature source Oral, resp. rate 20, height 5\' 2"  (1.575 m), weight 54.4 kg, SpO2 90  %.  Physical Examination: General appearance - alert, well appearing, and in no distress  Mental status - alert, oriented to person, place, and time,  Eyes - sclera anicteric Mouth- No upper teeth Neck - supple, no JVD elevation , Chest - clear  to auscultation bilaterally, symmetrical air movement,  Heart - S1 and S2 normal, irregular  Abdomen - soft, nontender, nondistended, no masses or organomegaly Extremities - no pedal edema noted, intact peripheral pulses  Skin - warm, dry Neurologial Exam: Mental Status: Patient is awake, alert, oriented to person, place, month, year, and situation. Patient is able to give a clear and coherent history. No signs of aphasia or neglect  -At baseline she is wheelchair-bound/limited due to previous back surgeries and rheumatoid arthritis Cranial Nerves: II: Visual Fields are full. Pupils are equal, round, and reactive to light.   III,IV, VI: EOMI without ptosis or diploplia.  V: Facial sensation is symmetrical and wnl VII: Facial movement is symmetric.  VIII: hearing is intact to voice X: Uvula elevates symmetrically XI: Shoulder shrug is symmetric. XII: tongue is midline without atrophy or fasciculations.  Motor: Tone is normal. Bulk is normal. 5/5 strength was present in all four extremities.  Sensory: Sensation is normal to pinprick and to touch Cerebellar: FNF without ataxia    Data Review:    CBC Recent Labs  Lab 08/22/18 1013 08/25/18 1237  WBC 5.2 5.5  HGB 12.6 12.7  HCT 39.6 40.4  PLT 205 195  MCV 88.4 88.0  MCH 28.1 27.7  MCHC 31.8 31.4  RDW 14.8 15.1   ------------------------------------------------------------------------------------------------------------------  Chemistries  Recent Labs  Lab 08/22/18 1013 08/25/18 1237  NA 138 140  K 3.6 4.0  CL 103 104  CO2 26 27  GLUCOSE 106* 101*  BUN 11 11  CREATININE 0.54 0.63  CALCIUM 8.7* 8.6*  AST  --  22  ALT  --  13  ALKPHOS  --  60  BILITOT  --  0.6    ------------------------------------------------------------------------------------------------------------------ estimated creatinine clearance is 42.1 mL/min (by C-G formula based on SCr of 0.63 mg/dL). ------------------------------------------------------------------------------------------------------------------ No results for input(s): TSH, T4TOTAL, T3FREE, THYROIDAB in the last 72 hours.  Invalid input(s): FREET3   Coagulation profile No results for input(s): INR, PROTIME in the last 168 hours. -------------------------------------------------------------------------------------------------------------------  No results for input(s): DDIMER in the last 72 hours. -------------------------------------------------------------------------------------------------------------------  Cardiac Enzymes No results for input(s): CKMB, TROPONINI, MYOGLOBIN in the last 168 hours.  Invalid input(s): CK ------------------------------------------------------------------------------------------------------------------    Component Value Date/Time   BNP 298.0 (H) 08/22/2018 1013     ---------------------------------------------------------------------------------------------------------------  Urinalysis    Component Value Date/Time   COLORURINE STRAW (A) 08/25/2018 1127   APPEARANCEUR CLEAR 08/25/2018 1127   LABSPEC 1.004 (L) 08/25/2018 1127   PHURINE 6.0 08/25/2018 1127   GLUCOSEU NEGATIVE 08/25/2018 1127   HGBUR NEGATIVE 08/25/2018 1127   [place] 08/25/2018 1127   KETONESUR NEGATIVE 08/25/2018 1127   PROTEINUR NEGATIVE 08/25/2018 1127   UROBILINOGEN 0.2 04/23/2014 1300   NITRITE NEGATIVE 08/25/2018 1127   LEUKOCYTESUR NEGATIVE 08/25/2018 1127    ----------------------------------------------------------------------------------------------------------------   Imaging Results:    Dg Chest 2 View  Result Date: 08/25/2018 CLINICAL DATA:  Shortness of breath.  Cough. EXAM: CHEST - 2 VIEW COMPARISON:  Radiograph 08/22/2018 FINDINGS: The cardiomediastinal contours are unchanged with aortic atherosclerosis. Heart is normal in size. Chronic elevation of right hemidiaphragm. Mild chronic interstitial coarsening is unchanged. Pulmonary vasculature is normal. No consolidation, pleural effusion, or pneumothorax. No acute osseous abnormalities are seen. Vertebroplasty in the upper lumbar spine and postsurgical change in the cervical spine, partially included. IMPRESSION: 1. No acute chest finding. 2.  Aortic Atherosclerosis (ICD10-I70.0). Electronically Signed   By: Keith Rake M.D.   On: 08/25/2018 12:39   Ct Head Wo Contrast  Result Date: 08/25/2018 CLINICAL DATA:  Expressive aphasia EXAM: CT HEAD WITHOUT CONTRAST TECHNIQUE: Contiguous axial images were obtained from the base of the skull through the vertex without intravenous contrast. COMPARISON:  January 02, 2017 FINDINGS: Brain: Mild diffuse atrophy is stable. There is no intracranial mass, hemorrhage, extra-axial fluid collection, or midline shift. There is patchy periventricular small vessel disease in the centra semiovale bilaterally. No acute appearing infarct is evident on this study. Vascular: There is no appreciable hyperdense vessel. There is calcification in each distal vertebral artery and carotid siphon region. Skull: Bony calvarium appears intact. Sinuses/Orbits: There is mucosal thickening in the left maxillary antrum with small air-fluid level. There is slight mucosal thickening in several ethmoid air cells. There is a 4 x 4 mm benign osteoma in the left ethmoid region. Orbits appear symmetric bilaterally. Other: Mastoid air cells are clear. IMPRESSION: Mild atrophy with patchy periventricular small vessel disease. No acute infarct. No mass or hemorrhage. There are foci of arterial vascular calcification. There are foci of relatively mild paranasal sinus disease. Electronically Signed   By: Lowella Grip III M.D.   On: 08/25/2018 12:21   Mr Angio Head Wo Contrast  Result Date: 08/25/2018 CLINICAL DATA:  Vertigo.  TIA.  Weakness.  Difficulty standing. EXAM: MRI HEAD WITHOUT CONTRAST MRA HEAD WITHOUT CONTRAST MRA NECK WITHOUT and with CONTRAST TECHNIQUE: Multiplanar, multiecho pulse sequences of the brain and surrounding structures were obtained without intravenous contrast. Angiographic images of the Circle of Willis were obtained using MRA technique without intravenous contrast. Angiographic images of the neck were obtained using MRA technique without intravenous contrast. Carotid stenosis measurements (when applicable) are obtained utilizing NASCET criteria, using the distal internal carotid diameter as the denominator. COMPARISON:  None. CONTRAST:  6 cc Gadavist FINDINGS: MRI HEAD FINDINGS Brain: Diffusion imaging shows a subcentimeter acute infarction within the right splenium of the corpus callosum. No other acute infarction. There chronic small-vessel ischemic changes of the pons. There are old small vessel cerebellar infarctions. There are  old small vessel infarctions affecting the thalami, basal ganglia and hemispheric white matter. No large vessel territory infarction. No mass lesion, hemorrhage, hydrocephalus or extra-axial collection. Vascular: Major vessels at the base of the brain show flow. Skull and upper cervical spine: Negative Sinuses/Orbits: Mild mucosal inflammatory changes of the left maxillary sinus. Other sinuses clear. Orbits negative. Other: None MRA HEAD FINDINGS Both internal carotid arteries are widely patent into the brain. No siphon stenosis. The anterior and middle cerebral vessels are patent without proximal stenosis, aneurysm or vascular malformation. Both vertebral arteries are widely patent to the basilar. No basilar stenosis. Posterior circulation branch vessels appear normal. MRA NECK FINDINGS Branching pattern of the brachiocephalic vessels from the arch is normal.  No origin stenosis. Both common carotid arteries are widely patent their respective bifurcation. There is mild atherosclerotic disease at both carotid bifurcations. 20% stenosis of the proximal ICA on the right. No measurable stenosis on the left. Cervical internal carotid arteries widely patent beyond that. Both vertebral arteries are widely patent at their origins and through the cervical region to the foramen magnum and basilar. IMPRESSION: Background pattern of chronic small vessel ischemic changes throughout the brain. Acute subcentimeter infarction in the right splenium of the corpus callosum which could be symptomatic. No sign of hemorrhage or mass effect. Atherosclerotic change of the carotid bifurcations. 20% stenosis of the proximal ICA on the right. No stenosis on the left. Negative intracranial MR angiography. No large or medium vessel occlusion. Electronically Signed   By: Nelson Chimes M.D.   On: 08/25/2018 15:58   Mr Angio Neck W Wo Contrast  Result Date: 08/25/2018 CLINICAL DATA:  Vertigo.  TIA.  Weakness.  Difficulty standing. EXAM: MRI HEAD WITHOUT CONTRAST MRA HEAD WITHOUT CONTRAST MRA NECK WITHOUT and with CONTRAST TECHNIQUE: Multiplanar, multiecho pulse sequences of the brain and surrounding structures were obtained without intravenous contrast. Angiographic images of the Circle of Willis were obtained using MRA technique without intravenous contrast. Angiographic images of the neck were obtained using MRA technique without intravenous contrast. Carotid stenosis measurements (when applicable) are obtained utilizing NASCET criteria, using the distal internal carotid diameter as the denominator. COMPARISON:  None. CONTRAST:  6 cc Gadavist FINDINGS: MRI HEAD FINDINGS Brain: Diffusion imaging shows a subcentimeter acute infarction within the right splenium of the corpus callosum. No other acute infarction. There chronic small-vessel ischemic changes of the pons. There are old small vessel  cerebellar infarctions. There are old small vessel infarctions affecting the thalami, basal ganglia and hemispheric white matter. No large vessel territory infarction. No mass lesion, hemorrhage, hydrocephalus or extra-axial collection. Vascular: Major vessels at the base of the brain show flow. Skull and upper cervical spine: Negative Sinuses/Orbits: Mild mucosal inflammatory changes of the left maxillary sinus. Other sinuses clear. Orbits negative. Other: None MRA HEAD FINDINGS Both internal carotid arteries are widely patent into the brain. No siphon stenosis. The anterior and middle cerebral vessels are patent without proximal stenosis, aneurysm or vascular malformation. Both vertebral arteries are widely patent to the basilar. No basilar stenosis. Posterior circulation branch vessels appear normal. MRA NECK FINDINGS Branching pattern of the brachiocephalic vessels from the arch is normal. No origin stenosis. Both common carotid arteries are widely patent their respective bifurcation. There is mild atherosclerotic disease at both carotid bifurcations. 20% stenosis of the proximal ICA on the right. No measurable stenosis on the left. Cervical internal carotid arteries widely patent beyond that. Both vertebral arteries are widely patent at their origins and through the cervical region  to the foramen magnum and basilar. IMPRESSION: Background pattern of chronic small vessel ischemic changes throughout the brain. Acute subcentimeter infarction in the right splenium of the corpus callosum which could be symptomatic. No sign of hemorrhage or mass effect. Atherosclerotic change of the carotid bifurcations. 20% stenosis of the proximal ICA on the right. No stenosis on the left. Negative intracranial MR angiography. No large or medium vessel occlusion. Electronically Signed   By: Nelson Chimes M.D.   On: 08/25/2018 15:58   Mr Brain Wo Contrast  Result Date: 08/25/2018 CLINICAL DATA:  Vertigo.  TIA.  Weakness.   Difficulty standing. EXAM: MRI HEAD WITHOUT CONTRAST MRA HEAD WITHOUT CONTRAST MRA NECK WITHOUT and with CONTRAST TECHNIQUE: Multiplanar, multiecho pulse sequences of the brain and surrounding structures were obtained without intravenous contrast. Angiographic images of the Circle of Willis were obtained using MRA technique without intravenous contrast. Angiographic images of the neck were obtained using MRA technique without intravenous contrast. Carotid stenosis measurements (when applicable) are obtained utilizing NASCET criteria, using the distal internal carotid diameter as the denominator. COMPARISON:  None. CONTRAST:  6 cc Gadavist FINDINGS: MRI HEAD FINDINGS Brain: Diffusion imaging shows a subcentimeter acute infarction within the right splenium of the corpus callosum. No other acute infarction. There chronic small-vessel ischemic changes of the pons. There are old small vessel cerebellar infarctions. There are old small vessel infarctions affecting the thalami, basal ganglia and hemispheric white matter. No large vessel territory infarction. No mass lesion, hemorrhage, hydrocephalus or extra-axial collection. Vascular: Major vessels at the base of the brain show flow. Skull and upper cervical spine: Negative Sinuses/Orbits: Mild mucosal inflammatory changes of the left maxillary sinus. Other sinuses clear. Orbits negative. Other: None MRA HEAD FINDINGS Both internal carotid arteries are widely patent into the brain. No siphon stenosis. The anterior and middle cerebral vessels are patent without proximal stenosis, aneurysm or vascular malformation. Both vertebral arteries are widely patent to the basilar. No basilar stenosis. Posterior circulation branch vessels appear normal. MRA NECK FINDINGS Branching pattern of the brachiocephalic vessels from the arch is normal. No origin stenosis. Both common carotid arteries are widely patent their respective bifurcation. There is mild atherosclerotic disease at both  carotid bifurcations. 20% stenosis of the proximal ICA on the right. No measurable stenosis on the left. Cervical internal carotid arteries widely patent beyond that. Both vertebral arteries are widely patent at their origins and through the cervical region to the foramen magnum and basilar. IMPRESSION: Background pattern of chronic small vessel ischemic changes throughout the brain. Acute subcentimeter infarction in the right splenium of the corpus callosum which could be symptomatic. No sign of hemorrhage or mass effect. Atherosclerotic change of the carotid bifurcations. 20% stenosis of the proximal ICA on the right. No stenosis on the left. Negative intracranial MR angiography. No large or medium vessel occlusion. Electronically Signed   By: Nelson Chimes M.D.   On: 08/25/2018 15:58    Radiological Exams on Admission: Dg Chest 2 View  Result Date: 08/25/2018 CLINICAL DATA:  Shortness of breath. Cough. EXAM: CHEST - 2 VIEW COMPARISON:  Radiograph 08/22/2018 FINDINGS: The cardiomediastinal contours are unchanged with aortic atherosclerosis. Heart is normal in size. Chronic elevation of right hemidiaphragm. Mild chronic interstitial coarsening is unchanged. Pulmonary vasculature is normal. No consolidation, pleural effusion, or pneumothorax. No acute osseous abnormalities are seen. Vertebroplasty in the upper lumbar spine and postsurgical change in the cervical spine, partially included. IMPRESSION: 1. No acute chest finding. 2.  Aortic Atherosclerosis (ICD10-I70.0). Electronically Signed  By: Keith Rake M.D.   On: 08/25/2018 12:39   Ct Head Wo Contrast  Result Date: 08/25/2018 CLINICAL DATA:  Expressive aphasia EXAM: CT HEAD WITHOUT CONTRAST TECHNIQUE: Contiguous axial images were obtained from the base of the skull through the vertex without intravenous contrast. COMPARISON:  January 02, 2017 FINDINGS: Brain: Mild diffuse atrophy is stable. There is no intracranial mass, hemorrhage, extra-axial  fluid collection, or midline shift. There is patchy periventricular small vessel disease in the centra semiovale bilaterally. No acute appearing infarct is evident on this study. Vascular: There is no appreciable hyperdense vessel. There is calcification in each distal vertebral artery and carotid siphon region. Skull: Bony calvarium appears intact. Sinuses/Orbits: There is mucosal thickening in the left maxillary antrum with small air-fluid level. There is slight mucosal thickening in several ethmoid air cells. There is a 4 x 4 mm benign osteoma in the left ethmoid region. Orbits appear symmetric bilaterally. Other: Mastoid air cells are clear. IMPRESSION: Mild atrophy with patchy periventricular small vessel disease. No acute infarct. No mass or hemorrhage. There are foci of arterial vascular calcification. There are foci of relatively mild paranasal sinus disease. Electronically Signed   By: Lowella Grip III M.D.   On: 08/25/2018 12:21   Mr Angio Head Wo Contrast  Result Date: 08/25/2018 CLINICAL DATA:  Vertigo.  TIA.  Weakness.  Difficulty standing. EXAM: MRI HEAD WITHOUT CONTRAST MRA HEAD WITHOUT CONTRAST MRA NECK WITHOUT and with CONTRAST TECHNIQUE: Multiplanar, multiecho pulse sequences of the brain and surrounding structures were obtained without intravenous contrast. Angiographic images of the Circle of Willis were obtained using MRA technique without intravenous contrast. Angiographic images of the neck were obtained using MRA technique without intravenous contrast. Carotid stenosis measurements (when applicable) are obtained utilizing NASCET criteria, using the distal internal carotid diameter as the denominator. COMPARISON:  None. CONTRAST:  6 cc Gadavist FINDINGS: MRI HEAD FINDINGS Brain: Diffusion imaging shows a subcentimeter acute infarction within the right splenium of the corpus callosum. No other acute infarction. There chronic small-vessel ischemic changes of the pons. There are old  small vessel cerebellar infarctions. There are old small vessel infarctions affecting the thalami, basal ganglia and hemispheric white matter. No large vessel territory infarction. No mass lesion, hemorrhage, hydrocephalus or extra-axial collection. Vascular: Major vessels at the base of the brain show flow. Skull and upper cervical spine: Negative Sinuses/Orbits: Mild mucosal inflammatory changes of the left maxillary sinus. Other sinuses clear. Orbits negative. Other: None MRA HEAD FINDINGS Both internal carotid arteries are widely patent into the brain. No siphon stenosis. The anterior and middle cerebral vessels are patent without proximal stenosis, aneurysm or vascular malformation. Both vertebral arteries are widely patent to the basilar. No basilar stenosis. Posterior circulation branch vessels appear normal. MRA NECK FINDINGS Branching pattern of the brachiocephalic vessels from the arch is normal. No origin stenosis. Both common carotid arteries are widely patent their respective bifurcation. There is mild atherosclerotic disease at both carotid bifurcations. 20% stenosis of the proximal ICA on the right. No measurable stenosis on the left. Cervical internal carotid arteries widely patent beyond that. Both vertebral arteries are widely patent at their origins and through the cervical region to the foramen magnum and basilar. IMPRESSION: Background pattern of chronic small vessel ischemic changes throughout the brain. Acute subcentimeter infarction in the right splenium of the corpus callosum which could be symptomatic. No sign of hemorrhage or mass effect. Atherosclerotic change of the carotid bifurcations. 20% stenosis of the proximal ICA on the right.  No stenosis on the left. Negative intracranial MR angiography. No large or medium vessel occlusion. Electronically Signed   By: Nelson Chimes M.D.   On: 08/25/2018 15:58   Mr Angio Neck W Wo Contrast  Result Date: 08/25/2018 CLINICAL DATA:  Vertigo.  TIA.   Weakness.  Difficulty standing. EXAM: MRI HEAD WITHOUT CONTRAST MRA HEAD WITHOUT CONTRAST MRA NECK WITHOUT and with CONTRAST TECHNIQUE: Multiplanar, multiecho pulse sequences of the brain and surrounding structures were obtained without intravenous contrast. Angiographic images of the Circle of Willis were obtained using MRA technique without intravenous contrast. Angiographic images of the neck were obtained using MRA technique without intravenous contrast. Carotid stenosis measurements (when applicable) are obtained utilizing NASCET criteria, using the distal internal carotid diameter as the denominator. COMPARISON:  None. CONTRAST:  6 cc Gadavist FINDINGS: MRI HEAD FINDINGS Brain: Diffusion imaging shows a subcentimeter acute infarction within the right splenium of the corpus callosum. No other acute infarction. There chronic small-vessel ischemic changes of the pons. There are old small vessel cerebellar infarctions. There are old small vessel infarctions affecting the thalami, basal ganglia and hemispheric white matter. No large vessel territory infarction. No mass lesion, hemorrhage, hydrocephalus or extra-axial collection. Vascular: Major vessels at the base of the brain show flow. Skull and upper cervical spine: Negative Sinuses/Orbits: Mild mucosal inflammatory changes of the left maxillary sinus. Other sinuses clear. Orbits negative. Other: None MRA HEAD FINDINGS Both internal carotid arteries are widely patent into the brain. No siphon stenosis. The anterior and middle cerebral vessels are patent without proximal stenosis, aneurysm or vascular malformation. Both vertebral arteries are widely patent to the basilar. No basilar stenosis. Posterior circulation branch vessels appear normal. MRA NECK FINDINGS Branching pattern of the brachiocephalic vessels from the arch is normal. No origin stenosis. Both common carotid arteries are widely patent their respective bifurcation. There is mild atherosclerotic  disease at both carotid bifurcations. 20% stenosis of the proximal ICA on the right. No measurable stenosis on the left. Cervical internal carotid arteries widely patent beyond that. Both vertebral arteries are widely patent at their origins and through the cervical region to the foramen magnum and basilar. IMPRESSION: Background pattern of chronic small vessel ischemic changes throughout the brain. Acute subcentimeter infarction in the right splenium of the corpus callosum which could be symptomatic. No sign of hemorrhage or mass effect. Atherosclerotic change of the carotid bifurcations. 20% stenosis of the proximal ICA on the right. No stenosis on the left. Negative intracranial MR angiography. No large or medium vessel occlusion. Electronically Signed   By: Nelson Chimes M.D.   On: 08/25/2018 15:58   Mr Brain Wo Contrast  Result Date: 08/25/2018 CLINICAL DATA:  Vertigo.  TIA.  Weakness.  Difficulty standing. EXAM: MRI HEAD WITHOUT CONTRAST MRA HEAD WITHOUT CONTRAST MRA NECK WITHOUT and with CONTRAST TECHNIQUE: Multiplanar, multiecho pulse sequences of the brain and surrounding structures were obtained without intravenous contrast. Angiographic images of the Circle of Willis were obtained using MRA technique without intravenous contrast. Angiographic images of the neck were obtained using MRA technique without intravenous contrast. Carotid stenosis measurements (when applicable) are obtained utilizing NASCET criteria, using the distal internal carotid diameter as the denominator. COMPARISON:  None. CONTRAST:  6 cc Gadavist FINDINGS: MRI HEAD FINDINGS Brain: Diffusion imaging shows a subcentimeter acute infarction within the right splenium of the corpus callosum. No other acute infarction. There chronic small-vessel ischemic changes of the pons. There are old small vessel cerebellar infarctions. There are old small vessel infarctions affecting  the thalami, basal ganglia and hemispheric white matter. No large  vessel territory infarction. No mass lesion, hemorrhage, hydrocephalus or extra-axial collection. Vascular: Major vessels at the base of the brain show flow. Skull and upper cervical spine: Negative Sinuses/Orbits: Mild mucosal inflammatory changes of the left maxillary sinus. Other sinuses clear. Orbits negative. Other: None MRA HEAD FINDINGS Both internal carotid arteries are widely patent into the brain. No siphon stenosis. The anterior and middle cerebral vessels are patent without proximal stenosis, aneurysm or vascular malformation. Both vertebral arteries are widely patent to the basilar. No basilar stenosis. Posterior circulation branch vessels appear normal. MRA NECK FINDINGS Branching pattern of the brachiocephalic vessels from the arch is normal. No origin stenosis. Both common carotid arteries are widely patent their respective bifurcation. There is mild atherosclerotic disease at both carotid bifurcations. 20% stenosis of the proximal ICA on the right. No measurable stenosis on the left. Cervical internal carotid arteries widely patent beyond that. Both vertebral arteries are widely patent at their origins and through the cervical region to the foramen magnum and basilar. IMPRESSION: Background pattern of chronic small vessel ischemic changes throughout the brain. Acute subcentimeter infarction in the right splenium of the corpus callosum which could be symptomatic. No sign of hemorrhage or mass effect. Atherosclerotic change of the carotid bifurcations. 20% stenosis of the proximal ICA on the right. No stenosis on the left. Negative intracranial MR angiography. No large or medium vessel occlusion. Electronically Signed   By: Nelson Chimes M.D.   On: 08/25/2018 15:58   DVT Prophylaxis -SCD /Eliquis AM Labs Ordered, also please review Full Orders  Family Communication: Admission, patients condition and plan of care including tests being ordered have been discussed with the patient and daughter Tye [place]  who indicate understanding and agree with the plan   Code Status - Full Code  Likely DC to  home  Condition - stable  Roxan Hockey M.D on 08/25/2018 at 5:19 PM Go to [email or web address] -  for contact info  [facility] - Office  [phone]

## 2018-08-25 NOTE — Progress Notes (Signed)
Notified bodenheimer, NP of recent telemetry change. New orders received.

## 2018-08-25 NOTE — ED Triage Notes (Signed)
Pt brought in by EMS. Reported that pt was still in bed at 10 am and son called and pt unable to get words out. Son then called sister who went to house to check on her and was fine at the time. EMS reports negative stroke screen Pt has complained of right sided head and face.

## 2018-08-25 NOTE — ED Provider Notes (Signed)
[facility] EMERGENCY DEPARTMENT Provider Note   CSN: [id number] Arrival date & time: 08/25/18  1058     History   Chief Complaint Chief Complaint  Patient presents with  . expressive aphasia    HPI [name] is a 85 y.o. female.  Present emergency room chief complaint expressive aphasia.  Approximately 10 AM, patient states she felt that something was not quite right with her speech.  Son talking to her on the phone noted patient had difficulty with speech.  Patient states she could understand him but was unable to get the right words out when talking to him over the phone.  Patient's son who is out of town called his sister who was local who went to the house to check on her.  Her daughter reports that but time she arrived to patient's symptoms had completely resolved.  EMS also reported complete resolution of symptoms.  Patient estimates total duration of symptoms around 30 minutes.  She denied associated vision changes, numbness, weakness, balance issues.  States that she has had a dull headache for the past month which is new.  States very mild in severity, more on the right side of her head.  Denies having tenderness to palpation over her temple regions, no swelling noted.  Additional history was obtained from patient's daughter who is present at bedside.  Patient provided most of history.     HPI  Past Medical History:  Diagnosis Date  . A-fib ([place])   . Atrial fibrillation ([place])   . Cervicalgia   . Colon cancer ([place])   . Degeneration of cervical intervertebral disc   . Hypertension   . Pinched nerve in shoulder   . Rheumatoid arthritis(714.0)   . RUQ pain   . Vulvar dystrophy   . Yeast infection     Patient Active Problem List   Diagnosis Date Noted  . CAP (community acquired pneumonia) 02/24/2017  . Acute respiratory failure with hypoxia ([place]) 02/23/2017  . Bronchitis 02/22/2017  . Hypoxia 02/22/2017  . Generalized weakness 02/22/2017  . RAD (reactive  airway disease) with wheezing, mild intermittent, with acute exacerbation 02/22/2017  . Chronic pain 02/22/2017  . Vulvar dystrophy 05/23/2014  . Atrial fibrillation ([place]) 08/10/2013  . Essential hypertension 07/04/2013  . Tachycardia 01/31/2013  . Dystrophy, vulva 05/04/2012  . Abdominal pain, epigastric 03/28/2012  . Dyspepsia 04/14/2011  . History of colon cancer 03/17/2011  . Diarrhea 03/17/2011  . HLD (hyperlipidemia) 11/30/2006  . BRONCHIECTASIS 11/30/2006  . RHEUMATOID ARTHRITIS 11/30/2006    Past Surgical History:  Procedure Laterality Date  . ABDOMINAL HYSTERECTOMY  1960'S  . APPENDECTOMY  with hysterectomy  . BACK SURGERY  12/08/10   plate/screws C3 C4  . CHOLECYSTECTOMY  2002  . COLONOSCOPY  04/02/2009  . COLONOSCOPY  06/04/05  . COLONOSCOPY N/A 07/26/2015   Procedure: COLONOSCOPY;  Surgeon: Rogene Houston, MD;  Location: [facility];  Service: Endoscopy;  Laterality: N/A;  1240  . COLONOSCOPY WITH ESOPHAGOGASTRODUODENOSCOPY (EGD) N/A 04/08/2012   Procedure: COLONOSCOPY WITH ESOPHAGOGASTRODUODENOSCOPY (EGD);  Surgeon: Rogene Houston, MD;  Location: [facility];  Service: Endoscopy;  Laterality: N/A;  130-moved to 245 Ann notified pt  . ESOPHAGOGASTRODUODENOSCOPY N/A 07/26/2015   Procedure: ESOPHAGOGASTRODUODENOSCOPY (EGD);  Surgeon: Rogene Houston, MD;  Location: [facility];  Service: Endoscopy;  Laterality: N/A;  . HEMICOLECTOMY  10/05     OB History    Gravida  4   Para  4   Term  Preterm      AB      Living  4     SAB      TAB      Ectopic      Multiple      Live Births  4            Home Medications    Prior to Admission medications   Medication Sig Start Date End Date Taking? Authorizing Provider  apixaban (ELIQUIS) 2.5 MG TABS tablet Take 1 tablet (2.5 mg total) by mouth 2 (two) times daily. 02/24/17 08/22/18  Johnson, Clanford L, MD  HYDROcodone-acetaminophen (NORCO/VICODIN) 5-325 MG tablet Take 1 tablet by mouth every 6  (six) hours as needed.    [provider]  leflunomide (ARAVA) 20 MG tablet Take 20 mg by mouth daily.    [provider]  metoprolol succinate (TOPROL-XL) 50 MG 24 hr tablet TAKE 1 TABLET BY MOUTH TWICE A DAY 06/20/18   Herminio Commons, MD  omeprazole (PRILOSEC) 20 MG capsule TAKE ONE CAPSULE BY MOUTH TWICE DAILY. 04/18/18   Setzer, Rona Ravens, NP    Family History Family History  Problem Relation Age of Onset  . Rheum arthritis Mother   . Parkinsonism Father   . Hiatal hernia Sister   . Healthy Brother   . Healthy Daughter   . Healthy Son   . Healthy Son     Social History Social History   Tobacco Use  . Smoking status: Never Smoker  . Smokeless tobacco: Never Used  Substance Use Topics  . Alcohol use: No  . Drug use: No     Allergies   Iodinated diagnostic agents, Nerve block tray, Nitrofuran derivatives, Triamcinolone acetonide, and Vibra-tab [doxycycline]   Review of Systems Review of Systems  Constitutional: Negative for chills and fever.  HENT: Negative for ear pain and sore throat.   Eyes: Negative for pain and visual disturbance.  Respiratory: Negative for cough and shortness of breath.   Cardiovascular: Negative for chest pain and palpitations.  Gastrointestinal: Negative for abdominal pain and vomiting.  Genitourinary: Negative for dysuria and hematuria.  Musculoskeletal: Negative for arthralgias and back pain.  Skin: Negative for color change and rash.  Neurological: Positive for speech difficulty and headaches. Negative for seizures and syncope.  All other systems reviewed and are negative.    Physical Exam Updated Vital Signs BP (!) 185/85 (BP Location: Left Arm)   Pulse 85   Temp 99 F (37.2 C) (Oral)   Resp (!) 24   Ht 5\' 2"  (1.575 m)   Wt 54.4 kg   SpO2 90%   BMI 21.95 kg/m   Physical Exam Vitals signs and nursing note reviewed.  Constitutional:      General: She is not in acute distress.    Appearance: She is  well-developed.  HENT:     Head: Normocephalic and atraumatic.  Eyes:     Conjunctiva/sclera: Conjunctivae normal.  Neck:     Musculoskeletal: Neck supple.  Cardiovascular:     Rate and Rhythm: Normal rate and regular rhythm.     Heart sounds: No murmur.  Pulmonary:     Effort: Pulmonary effort is normal. No respiratory distress.     Breath sounds: Normal breath sounds.  Abdominal:     Palpations: Abdomen is soft.     Tenderness: There is no abdominal tenderness.  Skin:    General: Skin is warm and dry.  Neurological:     Mental Status: She is alert  and oriented to person, place, and time.     GCS: GCS eye subscore is 4. GCS verbal subscore is 5. GCS motor subscore is 6.     Coordination: Finger-Nose-Finger Test normal.     Comments: 5 out of 5 strength in bilateral upper and lower extremities Sensation to light touch intact in all 4 extremities Speech clear, visual fields intact, cranial nerves II through XII intact      ED Treatments / Results  Labs (all labs ordered are listed, but only abnormal results are displayed) Labs Reviewed  URINALYSIS, ROUTINE W REFLEX MICROSCOPIC  COMPREHENSIVE METABOLIC PANEL  CBC  TROPONIN I (HIGH SENSITIVITY)    EKG None  Radiology No results found.  Procedures Procedures (including critical care time)  Medications Ordered in ED Medications - No data to display   Initial Impression / Assessment and Plan / ED Course  I have reviewed the triage vital signs and the nursing notes.  Pertinent labs & imaging results that were available during my care of the patient were reviewed by me and considered in my medical decision making (see chart for details).  Clinical Course as of Aug 25 1631  Thu Aug 25, 2018  1130 Completed initial assessment, discussed case with daughter who came to bedside   [RD]  1306 Reviewed results, will update patient, consult hospitalist for admission   [RD]  1319 Discussed with tele neuro nurse who will  get tele neuro to evaluate   [RD]  1319 Discussed with admitting hospitalist - requests tele neuro consult, MRI and rapid covid   [RD]    Clinical Course User Index [RD] Lucrezia Starch, MD       85 year old lady who presents after a episode of expressive aphasia.  Here no ongoing neurologic deficits or complaints.  CT head negative.  Will admit for TIA work-up.   Final Clinical Impressions(s) / ED Diagnoses   Final diagnoses:  TIA (transient ischemic attack)    ED Discharge Orders    None       Lucrezia Starch, MD 08/25/18 [phone]

## 2018-08-25 NOTE — Consult Note (Signed)
   [facility] Consult Services  Impression: TIA  Patient with multiple cv risk factors including A-fib on Eliquis presnted with 15 mins of aphasia. She has since resolved to her neurological baseline. Ptaient has NIHSS 0. Patient had some SOB and headache x1 week and so as an outoient underwent TTE which was unremarkable.    Recommendations:  -recommend admission fro TIA evaluation with MRI/MRA haead and neck, contnue Eliquis given non-focal neurological exam, telemetry, A1c, lipids.  Belinda Fisher, MD [facility]  ---------------------------------------------------------------------  CC:  as above   History of Present Illness:  as above  Exam:  NIHSS: 0  Mental Status:  awake alert and oriented   Cranial Nerves:  Pupils: Equal round and reactive to light Extraocular movements: intact in all directions gaze  Motor Exam:  no drift in all extremities no cranial nerve deficits   Tremor/Abnormal Movements:  Resting tremor: Absent Intention tremor: Absent Postural tremor: Absent  Sensory Exam:   Light touch: Intact     Medical Decision Making:  - Extensive number of diagnosis or management options are considered above.   - Extensive amount of complex data reviewed.   - High risk of complication and/or morbidity or mortality are associated with differential diagnostic considerations above.  - There may be uncertain outcome and increased probability of prolonged functional impairment or high probability of severe prolonged functional impairment associated with some of these differential diagnosis.   Medical Data Reviewed:  1.Data reviewed include clinical labs, radiology,  Medical Tests;   2.Tests results discussed w/performing or interpreting physician;   3.Obtaining/reviewing old medical records;  4.Obtaining case history from another source;  5.Independent review of image, tracing or specimen.    Patient was informed the  Neurology Consult would happen via TeleHealth consult by way of interactive audio and video telecommunications and consented to receiving care in this manner.

## 2018-08-25 NOTE — ED Notes (Signed)
Pt daughter at bedside

## 2018-08-26 ENCOUNTER — Inpatient Hospital Stay (HOSPITAL_COMMUNITY): Payer: Medicare Other

## 2018-08-26 DIAGNOSIS — R471 Dysarthria and anarthria: Secondary | ICD-10-CM | POA: Diagnosis not present

## 2018-08-26 DIAGNOSIS — I10 Essential (primary) hypertension: Secondary | ICD-10-CM

## 2018-08-26 LAB — BASIC METABOLIC PANEL
Anion gap: 12 (ref 5–15)
BUN: 13 mg/dL (ref 8–23)
CO2: 25 mmol/L (ref 22–32)
Calcium: 8.8 mg/dL — ABNORMAL LOW (ref 8.9–10.3)
Chloride: 103 mmol/L (ref 98–111)
Creatinine, Ser: 0.81 mg/dL (ref 0.44–1.00)
GFR calc Af Amer: >60 mL/min (ref >60)
GFR calc non Af Amer: >60 mL/min (ref >60)
Glucose, Bld: 123 mg/dL — ABNORMAL HIGH (ref 70–99)
Potassium: 3.5 mmol/L (ref 3.5–5.1)
Sodium: 140 mmol/L (ref 135–145)

## 2018-08-26 LAB — LIPID PANEL
Cholesterol: 248 mg/dL — ABNORMAL HIGH (ref 0–200)
HDL: 33 mg/dL — ABNORMAL LOW (ref >40)
LDL Cholesterol: UNDETERMINED mg/dL (ref 0–99)
Total CHOL/HDL Ratio: 7.5 RATIO
Triglycerides: 608 mg/dL — ABNORMAL HIGH (ref <150)
VLDL: UNDETERMINED mg/dL (ref 0–40)

## 2018-08-26 LAB — HEMOGLOBIN A1C
Hgb A1c MFr Bld: 5.3 % (ref 4.8–5.6)
Mean Plasma Glucose: 105.41 mg/dL

## 2018-08-26 LAB — CBC
HCT: 41.9 % (ref 36.0–46.0)
Hemoglobin: 12.9 g/dL (ref 12.0–15.0)
MCH: 27.2 pg (ref 26.0–34.0)
MCHC: 30.8 g/dL (ref 30.0–36.0)
MCV: 88.2 fL (ref 80.0–100.0)
Platelets: 222 10*3/uL (ref 150–400)
RBC: 4.75 MIL/uL (ref 3.87–5.11)
RDW: 15.1 % (ref 11.5–15.5)
WBC: 5.9 10*3/uL (ref 4.0–10.5)
nRBC: 0 % (ref 0.0–0.2)

## 2018-08-26 LAB — LDL CHOLESTEROL, DIRECT: Direct LDL: 177.9 mg/dL — ABNORMAL HIGH (ref 0–99)

## 2018-08-26 LAB — TSH: TSH: 0.877 u[IU]/mL (ref 0.350–4.500)

## 2018-08-26 LAB — GLUCOSE, CAPILLARY: Glucose-Capillary: 144 mg/dL — ABNORMAL HIGH (ref 70–99)

## 2018-08-26 MED ORDER — METOPROLOL SUCCINATE ER 50 MG PO TB24
50.0000 mg | ORAL_TABLET | Freq: Every day | ORAL | Status: DC
  Administered 2018-08-27 – 2018-08-29 (×3): 50 mg via ORAL
  Filled 2018-08-26 (×3): qty 1

## 2018-08-26 MED ORDER — ASPIRIN 81 MG PO CHEW
81.0000 mg | CHEWABLE_TABLET | Freq: Every day | ORAL | Status: DC
  Administered 2018-08-26 – 2018-08-31 (×6): 81 mg via ORAL
  Filled 2018-08-26 (×6): qty 1

## 2018-08-26 NOTE — Progress Notes (Signed)
Pt requested to use bedside commode; Diarrhea noted. Pt speech normal at time of transfer to [facility]. Upon pt sitting up on [facility] for a few minutes, pt became weak to bilateral arms, diaphoretic and pale. Pt also noted to have expressive aphasia.  Pt able to transfer back to bed with 2 person assist  Dr Maudie Mercury notified of neuro status change and at bedside to assess pt. VSS.no new orders at this time. Call bell within reach. Bed in lowest position. Will continue to monitor pt.

## 2018-08-26 NOTE — Plan of Care (Signed)
  Problem: Acute Rehab OT Goals (only OT should resolve) Goal: Pt. Will Perform Eating Flowsheets (Taken 08/26/2018 0918) Pt Will Perform Eating:  with modified independence  sitting Goal: Pt. Will Perform Grooming Flowsheets (Taken 08/26/2018 0918) Pt Will Perform Grooming:  with modified independence  sitting Goal: Pt. Will Perform Upper Body Dressing Flowsheets (Taken 08/26/2018 0918) Pt Will Perform Upper Body Dressing:  with supervision  sitting Goal: Pt. Will Transfer To Toilet Flowsheets (Taken 08/26/2018 [phone]) Pt Will Transfer to Toilet:  with min guard assist  stand pivot transfer  bedside commode Goal: Pt. Will Perform Toileting-Clothing Manipulation Flowsheets (Taken 08/26/2018 0918) Pt Will Perform Toileting - Clothing Manipulation and hygiene:  with supervision  sitting/lateral leans Goal: Pt/Caregiver Will Perform Home Exercise Program Flowsheets (Taken 08/26/2018 [phone]) Pt/caregiver will Perform Home Exercise Program:  Increased strength  Both right and left upper extremity  Independently  With written HEP provided

## 2018-08-26 NOTE — Progress Notes (Signed)
Patient Demographics:    [name], is a 85 y.o. female, DOB - 04-Feb-1933, QTM:[id number]  Admit date - 08/25/2018   Admitting Physician Remi Lopata Denton Brick, MD  Outpatient Primary MD for the patient is [name], MD  LOS - 1   Chief Complaint  Patient presents with   expressive aphasia        Subjective:    [name] today has no fevers, no emesis,  No chest pain, had another episode of expressive aphasia, left-sided neglect overnight again -Had A. fib with RVR and was somewhat hypotensive  Assessment  & Plan :    Principal Problem:   CVA (cerebral vascular accident) Acute subcentimeter infarction in the right Corpus Callosum Active Problems:   Chronic anticoagulation/Eliquis for Afib   Atrial fibrillation ([place])   HLD (hyperlipidemia)   Essential hypertension   Acute CVA (cerebrovascular accident) [facility])  Brief summary 85 y.o. female with past medical history relevant for chronic atrial fibrillation (on Eliquis for anticoagulation), HTN,, HLD, rheumatoid arthritis admitted with expressive aphasia and found to have acute stroke of the right corpus callosum  A/p 1)Acute CVA -right corpus callosum CVA with expressive aphasia that is now resolved-- place on telemetry monitored -patient remains in A. fib -Patient received aspirin, given recurrent episodes of expressive aphasia and left-sided deficits discussed with Dr. Simone Curia the neurologist, he advised continue aspirin 81 mg along with Eliquis 2.5 mg twice daily in the setting of A. Fib --- MRI brain shows acute subcentimeter infarction in the right MRA head and neck without LVO -echocardiogram from 08/23/2018 with preserved EF of 60 to 65% without acute findings.    PT  eval appreciated --Unable to calculate LDL as triglycerides are 608, total cholesterol is 248 and HDL is 33, non-HDL cholesterol is 215 -A1c 5.3 -TSH is  0.87 -  2) chronic atrial fibrillation--continue Eliquis 2.5 mg twice daily for secondary stroke prevention-- --tachycardia and soft BP noted -Continue Toprol-XL for rate control 50 mg for rate control, unable to titrate up due to soft BP  3)RA---At baseline she is wheelchair-bound/limited due to previous back surgeries and rheumatoid arthritis -Continue Arava  4) social/ethics--- plan of care discussed with patient and daughter [name] at bedside, she is a full code  Disposition/Need for in-Hospital Stay- patient unable to be discharged at this time due to recurrent neurological events deficits with recurrent expressive aphasia and recurrent left-sided deficits--- systolic blood pressures ranging from 87 mmhg to 188 mmhg---, tachycardia in the setting of A. fib with RVR--- need patient to be more hemodynamically stable prior to discharge  Code Status : full  Family Communication:    (patient is alert, awake and coherent) Discussed with daughter   Disposition Plan  : Home with home health  Consults  : Neurology  DVT Prophylaxis  : Eliquis- SCDs   Lab Results  Component Value Date   PLT 222 08/26/2018    Inpatient Medications  Scheduled Meds:  apixaban  2.5 mg Oral BID   aspirin  81 mg Oral Daily   atorvastatin  80 mg Oral q1800   cholecalciferol  2,000 Units Oral BID   leflunomide  20 mg Oral Daily   [START ON 08/27/2018] metoprolol succinate  50 mg Oral Daily   pantoprazole  40 mg Oral Daily   sodium chloride flush  3 mL Intravenous Q12H   Continuous Infusions:  sodium chloride     PRN Meds:.sodium chloride, acetaminophen **OR** acetaminophen, HYDROcodone-acetaminophen, labetalol, ondansetron **OR** ondansetron (ZOFRAN) IV, polyethylene glycol, sodium chloride flush, traZODone    Anti-infectives (From admission, onward)   None        Objective:   Vitals:   08/26/18 0529 08/26/18 0700 08/26/18 0900 08/26/18 1300  BP: 121/81 (!) 107/57 (!) 97/52 (!)  87/51  Pulse: 83 81 (!) 109 90  Resp:  18 18 18   Temp:  (!) 97.3 F (36.3 C) 97.9 F (36.6 C) 98.2 F (36.8 C)  TempSrc:  Oral Oral Oral  SpO2: 92% 94% 94% 93%  Weight:      Height:        Wt Readings from Last 3 Encounters:  08/25/18 54.4 kg  08/22/18 54.4 kg  04/07/17 54 kg     Intake/Output Summary (Last 24 hours) at 08/26/2018 1711 Last data filed at 08/25/2018 1800 Gross per 24 hour  Intake 240 ml  Output 350 ml  Net -110 ml    Physical Exam Gen:- Awake Alert, in no apparent distress  HEENT:- [place].AT, No sclera icterus Mouth-no upper teeth Neck-Supple Neck,No JVD,.  Lungs-  CTAB , fair symmetrical air movement CV- S1, S2 normal, irregularly irregular Abd-  +ve B.Sounds, Abd Soft, No tenderness,    Extremity/Skin:- No  edema, pedal pulses present  Psych-affect is appropriate, oriented x3 Neuro-generalized weakness, no new focal deficits, no tremors, At baseline she is wheelchair-bound/limited due to previous back surgeries and rheumatoid arthritis   Data Review:   Micro Results Recent Results (from the past 240 hour(s))  SARS Coronavirus 2 [facility] order, Performed in [facility] hospital lab) Nasopharyngeal Nasopharyngeal Swab     Status: None   Collection Time: 08/25/18  1:24 PM   Specimen: Nasopharyngeal Swab  Result Value Ref Range Status   SARS Coronavirus 2 NEGATIVE NEGATIVE Final    Comment: (NOTE) If result is NEGATIVE SARS-CoV-2 target nucleic acids are NOT DETECTED. The SARS-CoV-2 RNA is generally detectable in upper and lower  respiratory specimens during the acute phase of infection. The lowest  concentration of SARS-CoV-2 viral copies this assay can detect is 250  copies / mL. A negative result does not preclude SARS-CoV-2 infection  and should not be used as the sole basis for treatment or other  patient management decisions.  A negative result may occur with  improper specimen collection / handling, submission of specimen other  than  nasopharyngeal swab, presence of viral mutation(s) within the  areas targeted by this assay, and inadequate number of viral copies  (<250 copies / mL). A negative result must be combined with clinical  observations, patient history, and epidemiological information. If result is POSITIVE SARS-CoV-2 target nucleic acids are DETECTED. The SARS-CoV-2 RNA is generally detectable in upper and lower  respiratory specimens dur ing the acute phase of infection.  Positive  results are indicative of active infection with SARS-CoV-2.  Clinical  correlation with patient history and other diagnostic information is  necessary to determine patient infection status.  Positive results do  not rule out bacterial infection or co-infection with other viruses. If result is PRESUMPTIVE POSTIVE SARS-CoV-2 nucleic acids MAY BE PRESENT.   A presumptive positive result was obtained on the submitted specimen  and confirmed on repeat testing.  While 2019 novel coronavirus  (SARS-CoV-2) nucleic acids may be present in the submitted sample  additional confirmatory testing may be necessary for epidemiological  and / or clinical management purposes  to differentiate between  SARS-CoV-2 and other Sarbecovirus currently known to infect humans.  If clinically indicated additional testing with an alternate test  methodology [phone]) is advised. The SARS-CoV-2 RNA is generally  detectable in upper and lower respiratory sp ecimens during the acute  phase of infection. The expected result is Negative. Fact Sheet for Patients:  StrictlyIdeas.no Fact Sheet for Healthcare Providers: BankingDealers.co.za This test is not yet approved or cleared by the [place] FDA and has been authorized for detection and/or diagnosis of SARS-CoV-2 by FDA under an Emergency Use Authorization (EUA).  This EUA will remain in effect (meaning this test can be used) for the duration of  the COVID-19 declaration under Section 564(b)(1) of the Act, 21 U.S.C. section 360bbb-3(b)(1), unless the authorization is terminated or revoked sooner. Performed at [facility], [address] [place], [place] [address]     Radiology Reports Dg Chest 2 View  Result Date: 08/25/2018 CLINICAL DATA:  Shortness of breath. Cough. EXAM: CHEST - 2 VIEW COMPARISON:  Radiograph 08/22/2018 FINDINGS: The cardiomediastinal contours are unchanged with aortic atherosclerosis. Heart is normal in size. Chronic elevation of right hemidiaphragm. Mild chronic interstitial coarsening is unchanged. Pulmonary vasculature is normal. No consolidation, pleural effusion, or pneumothorax. No acute osseous abnormalities are seen. Vertebroplasty in the upper lumbar spine and postsurgical change in the cervical spine, partially included. IMPRESSION: 1. No acute chest finding. 2.  Aortic Atherosclerosis (ICD10-I70.0). Electronically Signed   By: Keith Rake M.D.   On: 08/25/2018 12:39   Dg Chest 2 View  Result Date: 08/22/2018 CLINICAL DATA:  Shortness of breath on exertion EXAM: CHEST - 2 VIEW COMPARISON:  02/24/2017 FINDINGS: Elevation of the right hemidiaphragm is noted. The heart is within normal limits. Aortic calcifications are noted. No focal infiltrate or sizable effusion is seen. Changes of prior vertebral augmentation are noted. Postsurgical changes in the cervical spine are seen as well. IMPRESSION: No acute abnormality noted. Electronically Signed   By: Inez Catalina M.D.   On: 08/22/2018 14:33   Ct Head Wo Contrast  Result Date: 08/25/2018 CLINICAL DATA:  Expressive aphasia EXAM: CT HEAD WITHOUT CONTRAST TECHNIQUE: Contiguous axial images were obtained from the base of the skull through the vertex without intravenous contrast. COMPARISON:  January 02, 2017 FINDINGS: Brain: Mild diffuse atrophy is stable. There is no intracranial mass, hemorrhage, extra-axial fluid collection, or midline shift. There is  patchy periventricular small vessel disease in the centra semiovale bilaterally. No acute appearing infarct is evident on this study. Vascular: There is no appreciable hyperdense vessel. There is calcification in each distal vertebral artery and carotid siphon region. Skull: Bony calvarium appears intact. Sinuses/Orbits: There is mucosal thickening in the left maxillary antrum with small air-fluid level. There is slight mucosal thickening in several ethmoid air cells. There is a 4 x 4 mm benign osteoma in the left ethmoid region. Orbits appear symmetric bilaterally. Other: Mastoid air cells are clear. IMPRESSION: Mild atrophy with patchy periventricular small vessel disease. No acute infarct. No mass or hemorrhage. There are foci of arterial vascular calcification. There are foci of relatively mild paranasal sinus disease. Electronically Signed   By: Lowella Grip III M.D.   On: 08/25/2018 12:21   Mr Angio Head Wo Contrast  Result Date: 08/25/2018 CLINICAL DATA:  Vertigo.  TIA.  Weakness.  Difficulty standing. EXAM: MRI HEAD WITHOUT CONTRAST MRA HEAD WITHOUT CONTRAST MRA NECK WITHOUT and  with CONTRAST TECHNIQUE: Multiplanar, multiecho pulse sequences of the brain and surrounding structures were obtained without intravenous contrast. Angiographic images of the Circle of Willis were obtained using MRA technique without intravenous contrast. Angiographic images of the neck were obtained using MRA technique without intravenous contrast. Carotid stenosis measurements (when applicable) are obtained utilizing NASCET criteria, using the distal internal carotid diameter as the denominator. COMPARISON:  None. CONTRAST:  6 cc Gadavist FINDINGS: MRI HEAD FINDINGS Brain: Diffusion imaging shows a subcentimeter acute infarction within the right splenium of the corpus callosum. No other acute infarction. There chronic small-vessel ischemic changes of the pons. There are old small vessel cerebellar infarctions. There are  old small vessel infarctions affecting the thalami, basal ganglia and hemispheric white matter. No large vessel territory infarction. No mass lesion, hemorrhage, hydrocephalus or extra-axial collection. Vascular: Major vessels at the base of the brain show flow. Skull and upper cervical spine: Negative Sinuses/Orbits: Mild mucosal inflammatory changes of the left maxillary sinus. Other sinuses clear. Orbits negative. Other: None MRA HEAD FINDINGS Both internal carotid arteries are widely patent into the brain. No siphon stenosis. The anterior and middle cerebral vessels are patent without proximal stenosis, aneurysm or vascular malformation. Both vertebral arteries are widely patent to the basilar. No basilar stenosis. Posterior circulation branch vessels appear normal. MRA NECK FINDINGS Branching pattern of the brachiocephalic vessels from the arch is normal. No origin stenosis. Both common carotid arteries are widely patent their respective bifurcation. There is mild atherosclerotic disease at both carotid bifurcations. 20% stenosis of the proximal ICA on the right. No measurable stenosis on the left. Cervical internal carotid arteries widely patent beyond that. Both vertebral arteries are widely patent at their origins and through the cervical region to the foramen magnum and basilar. IMPRESSION: Background pattern of chronic small vessel ischemic changes throughout the brain. Acute subcentimeter infarction in the right splenium of the corpus callosum which could be symptomatic. No sign of hemorrhage or mass effect. Atherosclerotic change of the carotid bifurcations. 20% stenosis of the proximal ICA on the right. No stenosis on the left. Negative intracranial MR angiography. No large or medium vessel occlusion. Electronically Signed   By: Nelson Chimes M.D.   On: 08/25/2018 15:58   Mr Angio Neck W Wo Contrast  Result Date: 08/25/2018 CLINICAL DATA:  Vertigo.  TIA.  Weakness.  Difficulty standing. EXAM: MRI  HEAD WITHOUT CONTRAST MRA HEAD WITHOUT CONTRAST MRA NECK WITHOUT and with CONTRAST TECHNIQUE: Multiplanar, multiecho pulse sequences of the brain and surrounding structures were obtained without intravenous contrast. Angiographic images of the Circle of Willis were obtained using MRA technique without intravenous contrast. Angiographic images of the neck were obtained using MRA technique without intravenous contrast. Carotid stenosis measurements (when applicable) are obtained utilizing NASCET criteria, using the distal internal carotid diameter as the denominator. COMPARISON:  None. CONTRAST:  6 cc Gadavist FINDINGS: MRI HEAD FINDINGS Brain: Diffusion imaging shows a subcentimeter acute infarction within the right splenium of the corpus callosum. No other acute infarction. There chronic small-vessel ischemic changes of the pons. There are old small vessel cerebellar infarctions. There are old small vessel infarctions affecting the thalami, basal ganglia and hemispheric white matter. No large vessel territory infarction. No mass lesion, hemorrhage, hydrocephalus or extra-axial collection. Vascular: Major vessels at the base of the brain show flow. Skull and upper cervical spine: Negative Sinuses/Orbits: Mild mucosal inflammatory changes of the left maxillary sinus. Other sinuses clear. Orbits negative. Other: None MRA HEAD FINDINGS Both internal carotid arteries are  widely patent into the brain. No siphon stenosis. The anterior and middle cerebral vessels are patent without proximal stenosis, aneurysm or vascular malformation. Both vertebral arteries are widely patent to the basilar. No basilar stenosis. Posterior circulation branch vessels appear normal. MRA NECK FINDINGS Branching pattern of the brachiocephalic vessels from the arch is normal. No origin stenosis. Both common carotid arteries are widely patent their respective bifurcation. There is mild atherosclerotic disease at both carotid bifurcations. 20%  stenosis of the proximal ICA on the right. No measurable stenosis on the left. Cervical internal carotid arteries widely patent beyond that. Both vertebral arteries are widely patent at their origins and through the cervical region to the foramen magnum and basilar. IMPRESSION: Background pattern of chronic small vessel ischemic changes throughout the brain. Acute subcentimeter infarction in the right splenium of the corpus callosum which could be symptomatic. No sign of hemorrhage or mass effect. Atherosclerotic change of the carotid bifurcations. 20% stenosis of the proximal ICA on the right. No stenosis on the left. Negative intracranial MR angiography. No large or medium vessel occlusion. Electronically Signed   By: Nelson Chimes M.D.   On: 08/25/2018 15:58   Mr Brain Wo Contrast  Result Date: 08/25/2018 CLINICAL DATA:  Vertigo.  TIA.  Weakness.  Difficulty standing. EXAM: MRI HEAD WITHOUT CONTRAST MRA HEAD WITHOUT CONTRAST MRA NECK WITHOUT and with CONTRAST TECHNIQUE: Multiplanar, multiecho pulse sequences of the brain and surrounding structures were obtained without intravenous contrast. Angiographic images of the Circle of Willis were obtained using MRA technique without intravenous contrast. Angiographic images of the neck were obtained using MRA technique without intravenous contrast. Carotid stenosis measurements (when applicable) are obtained utilizing NASCET criteria, using the distal internal carotid diameter as the denominator. COMPARISON:  None. CONTRAST:  6 cc Gadavist FINDINGS: MRI HEAD FINDINGS Brain: Diffusion imaging shows a subcentimeter acute infarction within the right splenium of the corpus callosum. No other acute infarction. There chronic small-vessel ischemic changes of the pons. There are old small vessel cerebellar infarctions. There are old small vessel infarctions affecting the thalami, basal ganglia and hemispheric white matter. No large vessel territory infarction. No mass lesion,  hemorrhage, hydrocephalus or extra-axial collection. Vascular: Major vessels at the base of the brain show flow. Skull and upper cervical spine: Negative Sinuses/Orbits: Mild mucosal inflammatory changes of the left maxillary sinus. Other sinuses clear. Orbits negative. Other: None MRA HEAD FINDINGS Both internal carotid arteries are widely patent into the brain. No siphon stenosis. The anterior and middle cerebral vessels are patent without proximal stenosis, aneurysm or vascular malformation. Both vertebral arteries are widely patent to the basilar. No basilar stenosis. Posterior circulation branch vessels appear normal. MRA NECK FINDINGS Branching pattern of the brachiocephalic vessels from the arch is normal. No origin stenosis. Both common carotid arteries are widely patent their respective bifurcation. There is mild atherosclerotic disease at both carotid bifurcations. 20% stenosis of the proximal ICA on the right. No measurable stenosis on the left. Cervical internal carotid arteries widely patent beyond that. Both vertebral arteries are widely patent at their origins and through the cervical region to the foramen magnum and basilar. IMPRESSION: Background pattern of chronic small vessel ischemic changes throughout the brain. Acute subcentimeter infarction in the right splenium of the corpus callosum which could be symptomatic. No sign of hemorrhage or mass effect. Atherosclerotic change of the carotid bifurcations. 20% stenosis of the proximal ICA on the right. No stenosis on the left. Negative intracranial MR angiography. No large or medium vessel occlusion.  Electronically Signed   By: Nelson Chimes M.D.   On: 08/25/2018 15:58    CBC Recent Labs  Lab 08/22/18 1013 08/25/18 1237 08/26/18 0545  WBC 5.2 5.5 5.9  HGB 12.6 12.7 12.9  HCT 39.6 40.4 41.9  PLT 205 195 222  MCV 88.4 88.0 88.2  MCH 28.1 27.7 27.2  MCHC 31.8 31.4 30.8  RDW 14.8 15.1 15.1    Chemistries  Recent Labs  Lab  08/22/18 1013 08/25/18 1237 08/26/18 0545  NA 138 140 140  K 3.6 4.0 3.5  CL 103 104 103  CO2 26 27 25   GLUCOSE 106* 101* 123*  BUN 11 11 13   CREATININE 0.54 0.63 0.81  CALCIUM 8.7* 8.6* 8.8*  AST  --  22  --   ALT  --  13  --   ALKPHOS  --  60  --   BILITOT  --  0.6  --    ------------------------------------------------------------------------------------------------------------------ Recent Labs    08/26/18 0545  CHOL 248*  HDL 33*  LDLCALC UNABLE TO CALCULATE IF TRIGLYCERIDE OVER 400 mg/dL  TRIG 608*  CHOLHDL 7.5  LDLDIRECT 177.9*    Lab Results  Component Value Date   HGBA1C 5.3 08/25/2018   ------------------------------------------------------------------------------------------------------------------ Recent Labs    08/25/18 1431  TSH 0.877   ------------------------------------------------------------------------------------------------------------------ No results for input(s): VITAMINB12, FOLATE, FERRITIN, TIBC, IRON, RETICCTPCT in the last 72 hours.  Coagulation profile No results for input(s): INR, PROTIME in the last 168 hours.  No results for input(s): DDIMER in the last 72 hours.  Cardiac Enzymes No results for input(s): CKMB, TROPONINI, MYOGLOBIN in the last 168 hours.  Invalid input(s): CK ------------------------------------------------------------------------------------------------------------------    Component Value Date/Time   BNP 298.0 (H) 08/22/2018 1013     Roxan Hockey M.D on 08/26/2018 at 5:11 PM  Go to [email or web address] - for contact info  [facility] - Office  [phone]

## 2018-08-26 NOTE — Plan of Care (Signed)
  Problem: Acute Rehab PT Goals(only PT should resolve) Goal: Pt Will Go Supine/Side To Sit Outcome: Progressing Flowsheets (Taken 08/26/2018 1226) Pt will go Supine/Side to Sit:  with minimal assist  with min guard assist Goal: Patient Will Transfer Sit To/From Stand Outcome: Progressing Flowsheets (Taken 08/26/2018 1226) Patient will transfer sit to/from stand:  with min guard assist  with minimal assist Goal: Pt Will Transfer Bed To Chair/Chair To Bed Outcome: Progressing Flowsheets (Taken 08/26/2018 1226) Pt will Transfer Bed to Chair/Chair to Bed:  min guard assist  with min assist Goal: Pt Will Ambulate Outcome: Progressing Flowsheets (Taken 08/26/2018 1226) Pt will Ambulate:  25 feet  with minimal assist  with rolling walker   12:27 PM, 08/26/18 Lonell Grandchild, MPT Physical Therapist with [facility] 336 [phone] office [phone] mobile phone

## 2018-08-26 NOTE — Consult Note (Signed)
[place] A. Merlene Laughter, MD     [email or web address]          [name] is an 85 y.o. female.   ASSESSMENT/PLAN: 1.  Recurrent episode of dysarthria and some altered mental status likely due to acute ischemic stroke involving the corpus callosum on the right side.  The patient symptoms are associated with mild alteration of consciousness/unresponsiveness which may suggest seizures but I think overall the patient events are most likely due to her acute ischemic stroke.  Risk factors atrial fibrillation, age, hypertension and dyslipidemia.  Patient is on appropriate dose of Eliquis for her age and weight.  Aspirin 81 mg is recommended for 2 weeks.  We suggest follow-up in the office with [place] in about 6 weeks.  The patient should be placed on a statin.  She previously was on Lipitor and had muscle weakness.  Consider using Crestor a couple times a week or other statins.  The patient is an 85 year old white female who reports 2 episodes of significant dysarthria and some unresponsiveness associated with amnesia and subtle alteration of consciousness.  The event lasted for about 10 minutes.  She had one event day before and 1 earlier this morning.  It appeared that both events occurred while she was sleeping.  She does not report associated focal weakness, numbness or dizziness.  There are no reports of associated chest pain or shortness of breath.  She has had right lower facial numbness and tingling for the past month after she got her right external canal cleaned.  She also has some pain and weakness of the left knee.  She also reports chronic shoulder pain bilaterally especially on the left.  The review of systems otherwise negative.     GENERAL: This is a pleasant thin female who is in no acute distress.  HEENT: She has reduced hearing.  Neck is supple no trauma appreciated.  ABDOMEN: soft  EXTREMITIES: No edema; there is significant arthritic changes of the knees  bilaterally especially the left.  BACK: Normal  SKIN: Normal by inspection.    MENTAL STATUS: Alert and oriented -including orientation to the month and her age. Speech, language and cognition are generally intact. Judgment and insight normal.   CRANIAL NERVES: Pupils are equal, round and reactive to light and accomodation; extra ocular movements are full, there is no significant nystagmus; visual fields are full; upper and lower facial muscles are normal in strength and symmetric, there is no flattening of the nasolabial folds; tongue is midline; uvula is midline; shoulder elevation is normal.  MOTOR: There is bilateral shoulder weakness graded as 4+/5 bilaterally.  There is mild drift involving the left upper extremity.  Distal hand strength are 5.  She has normal tone, bulk and strength of the lower extremities and there are no drift of the lower extremities.  COORDINATION: Left finger to nose is normal, right finger to nose is normal, No rest tremor; no intention tremor; no postural tremor; no bradykinesia.  REFLEXES: Deep tendon reflexes are symmetrical and normal.   SENSATION: Normal to light touch, temperature, and pain.    NIH stroke scale 1.     Blood pressure (!) 87/51, pulse 90, temperature 98.2 F (36.8 C), temperature source Oral, resp. rate 18, height 5\' 2"  (1.575 m), weight 54.4 kg, SpO2 93 %.  Past Medical History:  Diagnosis Date  . A-fib ([place])   . Atrial fibrillation ([place])   . Cervicalgia   . Colon cancer ([place])   . Degeneration  of cervical intervertebral disc   . Hypertension   . Pinched nerve in shoulder   . Rheumatoid arthritis(714.0)   . RUQ pain   . Vulvar dystrophy   . Yeast infection     Past Surgical History:  Procedure Laterality Date  . ABDOMINAL HYSTERECTOMY  1960'S  . APPENDECTOMY  with hysterectomy  . BACK SURGERY  12/08/10   plate/screws C3 C4  . CHOLECYSTECTOMY  2002  . COLONOSCOPY  04/02/2009  . COLONOSCOPY  06/04/05  . COLONOSCOPY  N/A 07/26/2015   Procedure: COLONOSCOPY;  Surgeon: Rogene Houston, MD;  [facility];  Service: Endoscopy;  Laterality: N/A;  1240  . COLONOSCOPY WITH ESOPHAGOGASTRODUODENOSCOPY (EGD) N/A 04/08/2012   Procedure: COLONOSCOPY WITH ESOPHAGOGASTRODUODENOSCOPY (EGD);  Surgeon: Rogene Houston, MD;  [facility];  Service: Endoscopy;  Laterality: N/A;  130-moved to 245 Ann notified pt  . ESOPHAGOGASTRODUODENOSCOPY N/A 07/26/2015   Procedure: ESOPHAGOGASTRODUODENOSCOPY (EGD);  Surgeon: Rogene Houston, MD;  [facility];  Service: Endoscopy;  Laterality: N/A;  . HEMICOLECTOMY  10/05    Family History  Problem Relation Age of Onset  . Rheum arthritis Mother   . Parkinsonism Father   . Hiatal hernia Sister   . Healthy Brother   . Healthy Daughter   . Healthy Son   . Healthy Son     Social History:  reports that she has never smoked. She has never used smokeless tobacco. She reports that she does not drink alcohol or use drugs.  Allergies:  Allergies  Allergen Reactions  . Iodinated Diagnostic Agents Anaphylaxis    08-12-10 Pt had severe reaction with itching/hives/loss of consciousness within 5 minutes of CEPI today.  She received both Kenalog and Iondinated Contrast, so not certain which agent caused reaction.  If patient returns for another CEPI, Dr. Jobe Igo considers using Decadron and a 13-hour prep.  jkl  . Nerve Block Tray Anaphylaxis    Patient states that she was sent to have Epidural for her shoulder. After rec'ving she remembers saying that she was itching all over  And that's it. She was transported to hospital  By EMS. Stayed overnight.  [place] Spikes Derivatives Hives and Nausea And Vomiting  . Triamcinolone Acetonide Anaphylaxis    08-12-10  Pt had severe reaction with itching/hives/loss of consciousness within five minutes of CEPI today.  She received both Kenalog and Iodinated Contrast, so not certain which agent caused reaction.  If pt returns for  another cervical epidural steroid injection, Dr. Jobe Igo suggests using Decadron and a 13-hr prep.  JKL PATIENT USES PROCTOCREAM 2.5% WITHOUT DIFFICULTY FOR VULVAR DYSTROPHY Patient says she can take prednisone without any problem  . Vibra-Tab [Doxycycline] Nausea And Vomiting    Medications: Prior to Admission medications   Medication Sig Start Date End Date Taking? Authorizing Provider  apixaban (ELIQUIS) 2.5 MG TABS tablet Take 1 tablet (2.5 mg total) by mouth 2 (two) times daily. 02/24/17 08/25/18 Yes Johnson, Clanford L, MD  cholecalciferol (VITAMIN D3) 25 MCG (1000 UT) tablet Take 2,000 Units by mouth 2 (two) times daily.   Yes [provider]  HYDROcodone-acetaminophen (NORCO/VICODIN) 5-325 MG tablet Take 1 tablet by mouth every 6 (six) hours as needed.   Yes [provider]  leflunomide (ARAVA) 20 MG tablet Take 20 mg by mouth daily.   Yes [provider]  metoprolol succinate (TOPROL-XL) 50 MG 24 hr tablet TAKE 1 TABLET BY MOUTH TWICE A DAY 06/20/18  Yes Herminio Commons, MD  omeprazole (PRILOSEC) 20 MG capsule TAKE ONE CAPSULE BY MOUTH TWICE DAILY. 04/18/18  Yes Setzer, Terri L, NP    Scheduled Meds: . apixaban  2.5 mg Oral BID  . aspirin  81 mg Oral Daily  . atorvastatin  80 mg Oral q1800  . cholecalciferol  2,000 Units Oral BID  . leflunomide  20 mg Oral Daily  . metoprolol succinate  50 mg Oral BID  . pantoprazole  40 mg Oral Daily  . sodium chloride flush  3 mL Intravenous Q12H   Continuous Infusions: . sodium chloride     PRN Meds:.sodium chloride, acetaminophen **OR** acetaminophen, HYDROcodone-acetaminophen, labetalol, ondansetron **OR** ondansetron (ZOFRAN) IV, polyethylene glycol, sodium chloride flush, traZODone     Results for orders placed or performed during the hospital encounter of 08/25/18 (from the past 48 hour(s))  Urinalysis, Routine w reflex microscopic     Status: Abnormal   Collection Time: 08/25/18 11:27 AM  Result Value  Ref Range   Color, Urine STRAW (A) YELLOW   APPearance CLEAR CLEAR   Specific Gravity, Urine 1.004 (L) 1.005 - 1.030   pH 6.0 5.0 - 8.0   Glucose, UA NEGATIVE NEGATIVE mg/dL   Hgb urine dipstick NEGATIVE NEGATIVE   Bilirubin Urine NEGATIVE NEGATIVE   Ketones, ur NEGATIVE NEGATIVE mg/dL   Protein, ur NEGATIVE NEGATIVE mg/dL   Nitrite NEGATIVE NEGATIVE   Leukocytes,Ua NEGATIVE NEGATIVE    Comment: Performed at [facility], [address] [place], [place] [address]  Comprehensive metabolic panel     Status: Abnormal   Collection Time: 08/25/18 12:37 PM  Result Value Ref Range   Sodium 140 135 - 145 mmol/L   Potassium 4.0 3.5 - 5.1 mmol/L   Chloride 104 98 - 111 mmol/L   CO2 27 22 - 32 mmol/L   Glucose, Bld 101 (H) 70 - 99 mg/dL   BUN 11 8 - 23 mg/dL   Creatinine, Ser 0.63 0.44 - 1.00 mg/dL   Calcium 8.6 (L) 8.9 - 10.3 mg/dL   Total Protein 6.9 6.5 - 8.1 g/dL   Albumin 3.7 3.5 - 5.0 g/dL   AST 22 15 - 41 U/L   ALT 13 0 - 44 U/L   Alkaline Phosphatase 60 38 - 126 U/L   Total Bilirubin 0.6 0.3 - 1.2 mg/dL   GFR calc non Af Amer >60 >60 mL/min   GFR calc Af Amer >60 >60 mL/min   Anion gap 9 5 - 15    Comment: Performed at [facility], [address], [place], [place] [address]  CBC     Status: None   Collection Time: 08/25/18 12:37 PM  Result Value Ref Range   WBC 5.5 4.0 - 10.5 K/uL   RBC 4.59 3.87 - 5.11 MIL/uL   Hemoglobin 12.7 12.0 - 15.0 g/dL   HCT 40.4 36.0 - 46.0 %   MCV 88.0 80.0 - 100.0 fL   MCH 27.7 26.0 - 34.0 pg   MCHC 31.4 30.0 - 36.0 g/dL   RDW 15.1 11.5 - 15.5 %   Platelets 195 150 - 400 K/uL   nRBC 0.0 0.0 - 0.2 %    Comment: Performed at [facility], [address], [place], [place] [address]  Troponin I (High Sensitivity)     Status: Abnormal   Collection Time: 08/25/18 12:37 PM  Result Value Ref Range   Troponin I (High Sensitivity) 24 (H) <18 ng/L    Comment: (NOTE) Elevated high sensitivity troponin I (hsTnI) values and significant  changes across  serial measurements may suggest ACS but many other  chronic and acute conditions are known to elevate hsTnI results.  Refer to the "Links" section for chest pain algorithms and additional  guidance. Performed at [facility], [address], [place], [place] [address]   SARS Coronavirus 2 [facility] order, Performed in [facility] hospital lab) Nasopharyngeal Nasopharyngeal Swab     Status: None   Collection Time: 08/25/18  1:24 PM   Specimen: Nasopharyngeal Swab  Result Value Ref Range   SARS Coronavirus 2 NEGATIVE NEGATIVE    Comment: (NOTE) If result is NEGATIVE SARS-CoV-2 target nucleic acids are NOT DETECTED. The SARS-CoV-2 RNA is generally detectable in upper and lower  respiratory specimens during the acute phase of infection. The lowest  concentration of SARS-CoV-2 viral copies this assay can detect is 250  copies / mL. A negative result does not preclude SARS-CoV-2 infection  and should not be used as the sole basis for treatment or other  patient management decisions.  A negative result may occur with  improper specimen collection / handling, submission of specimen other  than nasopharyngeal swab, presence of viral mutation(s) within the  areas targeted by this assay, and inadequate number of viral copies  (<250 copies / mL). A negative result must be combined with clinical  observations, patient history, and epidemiological information. If result is POSITIVE SARS-CoV-2 target nucleic acids are DETECTED. The SARS-CoV-2 RNA is generally detectable in upper and lower  respiratory specimens dur ing the acute phase of infection.  Positive  results are indicative of active infection with SARS-CoV-2.  Clinical  correlation with patient history and other diagnostic information is  necessary to determine patient infection status.  Positive results do  not rule out bacterial infection or co-infection with other viruses. If result is PRESUMPTIVE POSTIVE SARS-CoV-2 nucleic acids MAY  BE PRESENT.   A presumptive positive result was obtained on the submitted specimen  and confirmed on repeat testing.  While 2019 novel coronavirus  (SARS-CoV-2) nucleic acids may be present in the submitted sample  additional confirmatory testing may be necessary for epidemiological  and / or clinical management purposes  to differentiate between  SARS-CoV-2 and other Sarbecovirus currently known to infect humans.  If clinically indicated additional testing with an alternate test  methodology [phone]) is advised. The SARS-CoV-2 RNA is generally  detectable in upper and lower respiratory sp ecimens during the acute  phase of infection. The expected result is Negative. Fact Sheet for Patients:  StrictlyIdeas.no Fact Sheet for Healthcare Providers: BankingDealers.co.za This test is not yet approved or cleared by the [place] FDA and has been authorized for detection and/or diagnosis of SARS-CoV-2 by FDA under an Emergency Use Authorization (EUA).  This EUA will remain in effect (meaning this test can be used) for the duration of the COVID-19 declaration under Section 564(b)(1) of the Act, 21 U.S.C. section 360bbb-3(b)(1), unless the authorization is terminated or revoked sooner. Performed at [facility], [address] [place], [place] [address]   Troponin I (High Sensitivity)     Status: Abnormal   Collection Time: 08/25/18  2:31 PM  Result Value Ref Range   Troponin I (High Sensitivity) 35 (H) <18 ng/L    Comment: (NOTE) Elevated high sensitivity troponin I (hsTnI) values and significant  changes across serial measurements may suggest ACS but many other  chronic and acute conditions are known to elevate hsTnI results.  Refer to the "Links" section for chest pain algorithms and additional  guidance. Performed at [facility], [address], [place], [place] [address]   TSH     Status: None   Collection Time: 08/25/18  2:31 PM   Result Value Ref Range   TSH 0.877 0.350 - 4.500 uIU/mL    Comment: Performed by a 3rd Generation assay with a functional sensitivity of <=0.01 uIU/mL. Performed at [facility], [address], [place], [place] [address]   Hemoglobin A1c     Status: None   Collection Time: 08/25/18  2:31 PM  Result Value Ref Range   Hgb A1c MFr Bld 5.3 4.8 - 5.6 %    Comment: (NOTE) Pre diabetes:          5.7%-6.4% Diabetes:              >6.4% Glycemic control for   <7.0% adults with diabetes    Mean Plasma Glucose 105.41 mg/dL    Comment: Performed at [facility] [address], [place], [place] [address]  Glucose, capillary     Status: Abnormal   Collection Time: 08/26/18  5:18 AM  Result Value Ref Range   Glucose-Capillary 144 (H) 70 - 99 mg/dL  Basic metabolic panel     Status: Abnormal   Collection Time: 08/26/18  5:45 AM  Result Value Ref Range   Sodium 140 135 - 145 mmol/L   Potassium 3.5 3.5 - 5.1 mmol/L   Chloride 103 98 - 111 mmol/L   CO2 25 22 - 32 mmol/L   Glucose, Bld 123 (H) 70 - 99 mg/dL   BUN 13 8 - 23 mg/dL   Creatinine, Ser 0.81 0.44 - 1.00 mg/dL   Calcium 8.8 (L) 8.9 - 10.3 mg/dL   GFR calc non Af Amer >60 >60 mL/min   GFR calc Af Amer >60 >60 mL/min   Anion gap 12 5 - 15    Comment: Performed at [facility], [address], [place], [place] [address]  CBC     Status: None   Collection Time: 08/26/18  5:45 AM  Result Value Ref Range   WBC 5.9 4.0 - 10.5 K/uL   RBC 4.75 3.87 - 5.11 MIL/uL   Hemoglobin 12.9 12.0 - 15.0 g/dL   HCT 41.9 36.0 - 46.0 %   MCV 88.2 80.0 - 100.0 fL   MCH 27.2 26.0 - 34.0 pg   MCHC 30.8 30.0 - 36.0 g/dL   RDW 15.1 11.5 - 15.5 %   Platelets 222 150 - 400 K/uL   nRBC 0.0 0.0 - 0.2 %    Comment: Performed at [facility], [address] [place],  [address]  Lipid panel     Status: Abnormal   Collection Time: 08/26/18  5:45 AM  Result Value Ref Range   Cholesterol 248 (H) 0 - 200 mg/dL   Triglycerides 608 (H) <150 mg/dL    HDL 33 (L) >40 mg/dL   Total CHOL/HDL Ratio 7.5 RATIO   VLDL UNABLE TO CALCULATE IF TRIGLYCERIDE OVER 400 mg/dL 0 - 40 mg/dL   LDL Cholesterol UNABLE TO CALCULATE IF TRIGLYCERIDE OVER 400 mg/dL 0 - 99 mg/dL    Comment:        Total Cholesterol/HDL:CHD Risk Coronary Heart Disease Risk Table                     Men   Women  1/2 Average Risk   3.4   3.3  Average Risk       5.0  4.4  2 X Average Risk   9.6   7.1  3 X Average Risk  23.4   11.0        Use the calculated Patient Ratio above and the CHD Risk Table to determine the patient's CHD Risk.        ATP III CLASSIFICATION (LDL):  <100     mg/dL   Optimal  100-129  mg/dL   Near or Above                    Optimal  130-159  mg/dL   Borderline  160-189  mg/dL   High  >190     mg/dL   Very High Performed at [facility] [place], [place] [address]   LDL cholesterol, direct     Status: Abnormal   Collection Time: 08/26/18  5:45 AM  Result Value Ref Range   Direct LDL 177.9 (H) 0 - 99 mg/dL    Comment: Performed at [facility] [address], [place], [place] [address]    Studies/Results:  TTE 1. The left ventricle has normal systolic function with an ejection fraction of 60-65%. The cavity size was normal. Left ventricular diastolic Doppler parameters are indeterminate.  2. The right ventricle has normal systolic function. The cavity was normal. There is no increase in right ventricular wall thickness.  3. Left atrial size was mildly dilated.  4. The aortic valve has an indeterminate number of cusps. Mild thickening of the aortic valve. Mild calcification of the aortic valve. Aortic valve regurgitation is mild to moderate by color flow Doppler. Mild aortic annular calcification noted.  5. The mitral valve is abnormal. Mild thickening of the mitral valve leaflet. Mild calcification of the mitral valve leaflet. There is mild mitral annular calcification present. No evidence of mitral valve stenosis.  6. The  aorta is normal in size and structure.  7. Pulmonary hypertension is indeterminant, inadequate TR jet   BRAIN MRI MRA NECK MRA FINDINGS: MRI HEAD FINDINGS  Brain: Diffusion imaging shows a subcentimeter acute infarction within the right splenium of the corpus callosum. No other acute infarction. There chronic small-vessel ischemic changes of the pons. There are old small vessel cerebellar infarctions. There are old small vessel infarctions affecting the thalami, basal ganglia and hemispheric white matter. No large vessel territory infarction. No mass lesion, hemorrhage, hydrocephalus or extra-axial collection.  Vascular: Major vessels at the base of the brain show flow.  Skull and upper cervical spine: Negative  Sinuses/Orbits: Mild mucosal inflammatory changes of the left maxillary sinus. Other sinuses clear. Orbits negative.  Other: None  MRA HEAD FINDINGS  Both internal carotid arteries are widely patent into the brain. No siphon stenosis. The anterior and middle cerebral vessels are patent without proximal stenosis, aneurysm or vascular malformation.  Both vertebral arteries are widely patent to the basilar. No basilar stenosis. Posterior circulation branch vessels appear normal.  MRA NECK FINDINGS  Branching pattern of the brachiocephalic vessels from the arch is normal. No origin stenosis. Both common carotid arteries are widely patent their respective bifurcation. There is mild atherosclerotic disease at both carotid bifurcations. 20% stenosis of the proximal ICA on the right. No measurable stenosis on the left. Cervical internal carotid arteries widely patent beyond that.  Both vertebral arteries are widely patent at their origins and through the cervical region to the foramen magnum and basilar.  IMPRESSION: Background pattern of chronic small vessel ischemic changes throughout the brain. Acute subcentimeter  infarction in the right splenium of the  corpus callosum which could be symptomatic. No sign of hemorrhage or mass effect.  Atherosclerotic change of the carotid bifurcations. 20% stenosis of the proximal ICA on the right. No stenosis on the left.  Negative intracranial MR angiography. No large or medium vessel occlusion.      The brain MRI is reviewed in person.  A small infarct is observed involving the splenium of the corpus callosum on the right side.  This is faintly to moderately bright on DWI.  No hemorrhage.  There is moderate global atrophy.  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  Small areas of encephalomalacia is seen in the left pontine region and basal ganglia bilaterally.  No significant intracranial or extracranial occlusive disease is appreciated.    Saylor Sheckler A. Merlene Laughter, M.D.  Diplomate, Tax adviser of Psychiatry and Neurology ( Neurology). 08/26/2018, 5:02 PM

## 2018-08-26 NOTE — Evaluation (Signed)
Occupational Therapy Evaluation Patient Details Name: [name] MRN: [id number] DOB: 20-Nov-1933 Today's Date: 08/26/2018    History of Present Illness Opie Fanton  is a 85 y.o. female with past medical history relevant for chronic atrial fibrillation, HTN,, HLD, rheumatoid arthritis presented by EMS after episode of speech disturbance and altered mentation lasting 15 to 20 minutes. 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    Clinical Impression   Pt agreeable to evaluation this am, able to roll/shift/change position independently at bed level. Attempted to sit at EOB and immediately laid back down stating she was too dizzy, when asked specifically reports she is lightheaded. Pt is independent in ADLs at baseline and is currently required mod to max assist for ADL completion. Recommend SNF on discharge to improve safety and independence in ADLs. Will continue to follow and further assess functioning as pt is able to improve participation.     Follow Up Recommendations  SNF    Equipment Recommendations  None recommended by OT       Precautions / Restrictions Precautions Precautions: Fall Restrictions Weight Bearing Restrictions: No      Mobility Bed Mobility Overal bed mobility: Needs Assistance Bed Mobility: Supine to Sit;Sit to Supine     Supine to sit: Min assist Sit to supine: Min assist   General bed mobility comments: Pt sat up and immediately fell back onto bed stating she was too dizzy to sit up. OT provided assist and pt immediately requested to lie back down  Transfers                 General transfer comment: Not completed due to dizziness        ADL either performed or assessed with clinical judgement   ADL Overall ADL's : Needs assistance/impaired                     Lower Body Dressing: Maximal assistance;Bed level Lower Body Dressing Details (indicate cue type and reason): pt able to  bridge for pants to be removed Toilet Transfer: Maximal assistance Toilet Transfer Details (indicate cue type and reason): Pt requested bedpan as she was unable to sit up and transfer due to dizziness. Able to roll for bedpan placement and removal without difficulty Toileting- Clothing Manipulation and Hygiene: Total assistance;Bed level Toileting - Clothing Manipulation Details (indicate cue type and reason): Total assist for hygiene       General ADL Comments: Pt requiring increased assistance level due to inability to sit or stand secondary to dizziness.      Vision Baseline Vision/History: Wears glasses Wears Glasses: Reading only Patient Visual Report: No change from baseline Vision Assessment?: No apparent visual deficits Additional Comments: Will continue to monitor            Pertinent Vitals/Pain Pain Assessment: No/denies pain     Hand Dominance Right   Extremity/Trunk Assessment Upper Extremity Assessment Upper Extremity Assessment: Generalized weakness(BUE 4-/5)   Lower Extremity Assessment Lower Extremity Assessment: Defer to PT evaluation       Communication Communication Communication: No difficulties   Cognition Arousal/Alertness: Awake/alert Behavior During Therapy: WFL for tasks assessed/performed Overall Cognitive Status: Within Functional Limits for tasks assessed  Home Living Family/patient expects to be discharged to:: Private residence Living Arrangements: Alone Available Help at Discharge: Family;Available PRN/intermittently Type of Home: House Home Access: Stairs to enter [employer] of Steps: 2 Entrance Stairs-Rails: Right(ascending) Home Layout: One level     Bathroom Shower/Tub: Teacher, early years/pre: Standard     Home Equipment: Environmental consultant - 2 wheels;Cane - single point;Shower seat;Wheelchair - manual   Additional Comments: Pt reports she uses  wheelchair for mobility due to RLE weakness. Practices walking by standing and pulling chair behind her.       Prior Functioning/Environment Level of Independence: Independent with assistive device(s)        Comments: Uses wheelchair for mobility. Reports she is able to transfer to bed/tub/chair independently and completes ADLs independently. 2 children nearby if she should need assistance         OT Problem List: Decreased strength;Decreased activity tolerance;Impaired balance (sitting and/or standing);Decreased safety awareness      OT Treatment/Interventions: Self-care/ADL training;Therapeutic exercise;Neuromuscular education;Therapeutic activities;Patient/family education    OT Goals(Current goals can be found in the care plan section) Acute Rehab OT Goals Patient Stated Goal: To get better and return home OT Goal Formulation: With patient Time For Goal Achievement: 09/09/18 Potential to Achieve Goals: Good  OT Frequency: Min 2X/week   Barriers to D/C: Decreased caregiver support                End of Session    Activity Tolerance: Other (comment)(limited secondary to dizziness) Patient left: in bed;with call bell/phone within reach;with bed alarm set  OT Visit Diagnosis: Muscle weakness (generalized) (M62.81)                Time: 7078-6754 OT Time Calculation (min): 18 min Charges:  OT General Charges $OT Visit: 1 Visit OT Evaluation $OT Eval Low Complexity: [place], OTR/L  [phone] 08/26/2018, 9:15 AM

## 2018-08-26 NOTE — Evaluation (Signed)
Physical Therapy Evaluation Patient Details Name: [name] MRN: [id number] DOB: Sep 08, 1933 Today's Date: 08/26/2018   History of Present Illness  [name]  is a 85 y.o. female with past medical history relevant for chronic atrial fibrillation, HTN,, HLD, rheumatoid arthritis presented by EMS after episode of speech disturbance and altered mentation lasting 15 to 20 minutes. 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     Clinical Impression  Patient presents with severe pain bilateral shoulders, right worse than left, no significant difference in strength BLE, had most difficulty with sitting up at bedside due to right shoulder pain and limited to a few steps at bedside due to generalized weakness and c/o fatigue.  Patient tolerated sitting up in chair after therapy.  Patient will benefit from continued physical therapy in hospital and recommended venue below to increase strength, balance, endurance for safe ADLs and gait.    Follow Up Recommendations SNF    Equipment Recommendations  None recommended by PT    Recommendations for Other Services       Precautions / Restrictions Precautions Precautions: Fall Restrictions Weight Bearing Restrictions: No      Mobility  Bed Mobility Overal bed mobility: Needs Assistance Bed Mobility: Rolling;Sidelying to Sit Rolling: Min guard Sidelying to sit: Mod assist Supine to sit: Min assist Sit to supine: Min assist   General bed mobility comments: had difficulty sitting up at bedside due to c/o severe pain in shoulders, right worse than left  Transfers Overall transfer level: Needs assistance Equipment used: Rolling walker (2 wheeled) Transfers: Sit to/from [facility] Sit to Stand: Min assist;Mod assist Stand pivot transfers: Min assist;Mod assist       General transfer comment: slow labored movement  Ambulation/Gait Ambulation/Gait assistance: Mod assist Gait  Distance (Feet): 4 Feet Assistive device: Rolling walker (2 wheeled) Gait Pattern/deviations: Decreased step length - right;Decreased step length - left;Decreased stride length Gait velocity: slow   General Gait Details: limited to 4-5 slow labored steps at bedside due to c/o fatigue, mild dizziness, and severe bilateral shoulder pain  Stairs            Wheelchair Mobility    Modified Rankin (Stroke Patients Only)       Balance Overall balance assessment: Needs assistance Sitting-balance support: Feet supported;No upper extremity supported Sitting balance-Leahy Scale: Fair Sitting balance - Comments: seated at bedside   Standing balance support: Bilateral upper extremity supported;During functional activity Standing balance-Leahy Scale: Fair Standing balance comment: using RW                             Pertinent Vitals/Pain Pain Assessment: Faces Faces Pain Scale: Hurts whole lot Pain Location: bilateral shoulders mostly right with movement, some in back and generalized pain all over Pain Descriptors / Indicators: Aching;Sharp;Grimacing;Guarding Pain Intervention(s): Limited activity within patient's tolerance;Monitored during session    [place] expects to be discharged to:: Private residence Living Arrangements: Alone Available Help at Discharge: Family;Available PRN/intermittently Type of Home: House Home Access: Stairs to enter Entrance Stairs-Rails: Right Entrance Stairs-Number of Steps: 2 Home Layout: One level Home Equipment: Walker - 2 wheels;Cane - single point;Shower seat;Wheelchair - manual Additional Comments: Pt reports she uses wheelchair for mobility due to RLE weakness. Practices walking by standing and pulling chair behind her.     Prior Function Level of Independence: Needs assistance   Gait / Transfers Assistance Needed: short distanced household  ambulation pulling wheelchair behind her, otherwise leans on nearby  objects for support  ADL's / Homemaking Assistance Needed: assisted by family PRN, but not available 24/7  Comments: Uses wheelchair for mobility. Reports she is able to transfer to bed/tub/chair independently and completes ADLs independently. 2 children nearby if she should need assistance      Hand Dominance   Dominant Hand: Right    Extremity/Trunk Assessment   Upper Extremity Assessment Upper Extremity Assessment: Defer to OT evaluation    Lower Extremity Assessment Lower Extremity Assessment: Generalized weakness    Cervical / Trunk Assessment Cervical / Trunk Assessment: Kyphotic  Communication   Communication: No difficulties  Cognition Arousal/Alertness: Awake/alert Behavior During Therapy: WFL for tasks assessed/performed Overall Cognitive Status: Within Functional Limits for tasks assessed                                        General Comments      Exercises     Assessment/Plan    PT Assessment Patient needs continued PT services  PT Problem List Decreased strength;Decreased activity tolerance;Decreased balance;Decreased mobility       PT Treatment Interventions Gait training;Stair training;Functional mobility training;Therapeutic activities;Therapeutic exercise;Patient/family education    PT Goals (Current goals can be found in the Care Plan section)  Acute Rehab PT Goals Patient Stated Goal: To get better and return home PT Goal Formulation: With patient Time For Goal Achievement: 09/09/18 Potential to Achieve Goals: Good    Frequency 7X/week   Barriers to discharge        Co-evaluation               AM-PAC PT "6 Clicks" Mobility  Outcome Measure Help needed turning from your back to your side while in a flat bed without using bedrails?: A Little Help needed moving from lying on your back to sitting on the side of a flat bed without using bedrails?: A Lot Help needed moving to and from a bed to a chair (including a  wheelchair)?: A Lot Help needed standing up from a chair using your arms (e.g., wheelchair or bedside chair)?: A Lot Help needed to walk in hospital room?: A Lot Help needed climbing 3-5 steps with a railing? : Total 6 Click Score: 12    End of Session   Activity Tolerance: Patient tolerated treatment well;Patient limited by fatigue;Patient limited by pain Patient left: in chair;with call bell/phone within reach;with chair alarm set Nurse Communication: Mobility status PT Visit Diagnosis: Unsteadiness on feet (R26.81);Other abnormalities of gait and mobility (R26.89);Muscle weakness (generalized) (M62.81)    Time: 7048-8891 PT Time Calculation (min) (ACUTE ONLY): 34 min   Charges:   PT Evaluation $PT Eval Moderate Complexity: 1 Mod PT Treatments $Therapeutic Activity: 23-37 mins        12:23 PM, 08/26/18 Lonell Grandchild, MPT Physical Therapist with [facility] [phone] office [phone] mobile phone

## 2018-08-26 NOTE — Progress Notes (Signed)
Notified dr Maudie Mercury of recent telemetry change. No new orders. Pt resting in bed with no complaints

## 2018-08-26 NOTE — Care Management Important Message (Signed)
Important Message  Patient Details  Name: [name] MRN: [id number] Date of Birth: June 20, 1933   Medicare Important Message Given:  Yes     Ihor Gully, LCSW 08/26/2018, 2:58 PM

## 2018-08-26 NOTE — NC FL2 (Signed)
[place] LEVEL OF CARE SCREENING TOOL     IDENTIFICATION  Patient Name: [name] Birthdate: [birth year] Sex: female Admission Date (Current Location): 08/25/2018  [facility] and [place] Number:  [employer] and Address:  [place] [address] [place]      Provider Number: [id number]  Attending Physician Name and Address:  Roxan Hockey, MD  Relative Name and Phone Number:       Current Level of Care: Hospital Recommended Level of Care: [place] Prior Approval Number:    Date Approved/Denied:   PASRR Number: [id number]  Discharge Plan: SNF    Current Diagnoses: Patient Active Problem List   Diagnosis Date Noted  . TIA (transient ischemic attack) 08/25/2018  . CVA (cerebral vascular accident) Acute subcentimeter infarction in the right Corpus Callosum 08/25/2018  . Chronic anticoagulation/Eliquis for Afib 08/25/2018  . Acute CVA (cerebrovascular accident) ([place]) 08/25/2018  . CAP (community acquired pneumonia) 02/24/2017  . Acute respiratory failure with hypoxia ([place]) 02/23/2017  . Bronchitis 02/22/2017  . Hypoxia 02/22/2017  . Generalized weakness 02/22/2017  . RAD (reactive airway disease) with wheezing, mild intermittent, with acute exacerbation 02/22/2017  . Chronic pain 02/22/2017  . Vulvar dystrophy 05/23/2014  . Atrial fibrillation ([place]) 08/10/2013  . Essential hypertension 07/04/2013  . Tachycardia 01/31/2013  . Dystrophy, vulva 05/04/2012  . Abdominal pain, epigastric 03/28/2012  . Dyspepsia 04/14/2011  . History of colon cancer 03/17/2011  . Diarrhea 03/17/2011  . HLD (hyperlipidemia) 11/30/2006  . BRONCHIECTASIS 11/30/2006  . RHEUMATOID ARTHRITIS 11/30/2006    Orientation RESPIRATION BLADDER Height & Weight     Self, Time, Situation, Place  Normal Continent Weight: 120 lb (54.4 kg) Height:  5\' 2"  (157.5 cm)  BEHAVIORAL SYMPTOMS/MOOD NEUROLOGICAL BOWEL NUTRITION STATUS       Continent Diet(heart healthy)  AMBULATORY STATUS COMMUNICATION OF NEEDS Skin   Limited Assist Verbally Normal                       Personal Care Assistance Level of Assistance  Bathing, Feeding, Dressing Bathing Assistance: Limited assistance Feeding assistance: Independent Dressing Assistance: Limited assistance     Functional Limitations Info  Sight, Speech, Hearing Sight Info: Adequate Hearing Info: Adequate Speech Info: Adequate    SPECIAL CARE FACTORS FREQUENCY  OT (By licensed OT), PT (By licensed PT)     PT Frequency: 5x/week OT Frequency: 3x/week            Contractures Contractures Info: Not present    Additional Factors Info  Code Status, Allergies Code Status Info: full code Allergies Info: Iodinated diagnositc agents, nerve block tray, nitrofuran derivatives, triamcinolone acetonide, vibra-tab (doxycycline)           Current Medications (08/26/2018):  This is the current hospital active medication list Current Facility-Administered Medications  Medication Dose Route Frequency Provider Last Rate Last Dose  . 0.9 %  sodium chloride infusion  250 mL Intravenous PRN Emokpae, Courage, MD      . acetaminophen (TYLENOL) tablet 650 mg  650 mg Oral Q6H PRN Emokpae, Courage, MD   650 mg at 08/25/18 2111   Or  . acetaminophen (TYLENOL) suppository 650 mg  650 mg Rectal Q6H PRN Emokpae, Courage, MD      . apixaban (ELIQUIS) tablet 2.5 mg  2.5 mg Oral BID Denton Brick, Courage, MD   2.5 mg at 08/26/18 0920  . aspirin chewable tablet 81 mg  81 mg Oral Daily Emokpae,  Courage, MD   81 mg at 08/26/18 1038  . atorvastatin (LIPITOR) tablet 80 mg  80 mg Oral q1800 Emokpae, Courage, MD   80 mg at 08/25/18 1830  . cholecalciferol (VITAMIN D3) tablet 2,000 Units  2,000 Units Oral BID Roxan Hockey, MD   2,000 Units at 08/26/18 0921  . HYDROcodone-acetaminophen (NORCO/VICODIN) 5-325 MG per tablet 1 tablet  1 tablet Oral Q6H PRN Roxan Hockey, MD   1 tablet at  08/26/18 1038  . labetalol (NORMODYNE) injection 10 mg  10 mg Intravenous Q4H PRN Emokpae, Courage, MD      . leflunomide (ARAVA) tablet 20 mg  20 mg Oral Daily Emokpae, Courage, MD   20 mg at 08/26/18 0920  . metoprolol succinate (TOPROL-XL) 24 hr tablet 50 mg  50 mg Oral BID Denton Brick, Courage, MD   50 mg at 08/26/18 0920  . ondansetron (ZOFRAN) tablet 4 mg  4 mg Oral Q6H PRN Emokpae, Courage, MD       Or  . ondansetron (ZOFRAN) injection 4 mg  4 mg Intravenous Q6H PRN Emokpae, Courage, MD      . pantoprazole (PROTONIX) EC tablet 40 mg  40 mg Oral Daily Emokpae, Courage, MD   40 mg at 08/26/18 0920  . polyethylene glycol (MIRALAX / GLYCOLAX) packet 17 g  17 g Oral Daily PRN Emokpae, Courage, MD      . sodium chloride flush (NS) 0.9 % injection 3 mL  3 mL Intravenous Q12H Emokpae, Courage, MD   3 mL at 08/26/18 0921  . sodium chloride flush (NS) 0.9 % injection 3 mL  3 mL Intravenous PRN Emokpae, Courage, MD      . traZODone (DESYREL) tablet 50 mg  50 mg Oral QHS PRN Roxan Hockey, MD   50 mg at 08/25/18 2108     Discharge Medications: Please see discharge summary for a list of discharge medications.  Relevant Imaging Results:  Relevant Lab Results:   Additional Information SSN [id number], Clydene Pugh, LCSW

## 2018-08-26 NOTE — Progress Notes (Signed)
Xcover Per RN pt has recurrent aphasia.  Pt per H and P is wheelchair bound, limited due to previous back surgeries/ rheumatoid arthritis.  Her disability provides modified rankin scale =4 that would preclude endovascular treatment   Brief Exam:   Irr, irr s1, s2 Pt has expressive aphasia Pt has some neglect Subtle left sided weakness  A/P CVA with expressive aphasia Pafib Cont Eliquis Cont Lipitor Cont Toprol XL 50mg  po bid  Repeat MRI brain    Spoke with neurology briefly , not a candidate for TPA/ endovascular treatment Appreciate input

## 2018-08-26 NOTE — TOC Initial Note (Addendum)
Transition of Care [facility]) - Initial/Assessment Note    Patient Details  Name: [name] MRN: [id number] Date of Birth: 04/07/1933  [facility] CM/SW Contact:    Ihor Gully, LCSW Phone Number: 08/26/2018, 1:33 PM  Clinical Narrative:                 At baseline patient has uses a wheelchair for the past year. She can transfer independently and stand up at short distances (using the bathroom, stand at the sink). Patient is independent in bathing and washing her hair.  Mrs. Purcell Nails states that patient does not have much strength in her legs.  She states that patient does not want to go to rehab but she and her siblings feel it is needed.  She states that she will continue to talk with patient about going to rehab. SNF choices were provided and referrals sent to facilities of interest.  Update: Patient is agreeable to SNF. Expected Discharge Plan: Skilled Nursing Facility Barriers to Discharge: No Barriers Identified   Patient Goals and CMS Choice Patient states their goals for this hospitalization and ongoing recovery are:: return home CMS Medicare.gov Compare Post Acute Care list provided to:: Patient Represenative (must comment)(dtr. Mrs. Lawrence)    Expected Discharge Plan and Services Expected Discharge Plan: [place]       Living arrangements for the past 2 months: Single Family Home                                      Prior Living Arrangements/Services Living arrangements for the past 2 months: Single Family Home Lives with:: Self Patient language and need for interpreter reviewed:: Yes Do you feel safe going back to the place where you live?: Yes      Need for Family Participation in Patient Care: Yes (Comment) Care giver support system in place?: Yes (comment) Current home services: DME Criminal Activity/Legal Involvement Pertinent to Current Situation/Hospitalization: No - Comment as needed  Activities of Daily  Living Home Assistive Devices/Equipment: Wheelchair ADL Screening (condition at time of admission) Patient's cognitive ability adequate to safely complete daily activities?: Yes Is the patient deaf or have difficulty hearing?: No Does the patient have difficulty seeing, even when wearing glasses/contacts?: No Does the patient have difficulty concentrating, remembering, or making decisions?: Yes Patient able to express need for assistance with ADLs?: Yes Does the patient have difficulty dressing or bathing?: No Independently performs ADLs?: Yes (appropriate for developmental age) Does the patient have difficulty walking or climbing stairs?: Yes Weakness of Legs: None Weakness of Arms/Hands: None  Permission Sought/Granted Permission sought to share information with : Family Supports          Permission granted to share info w Relationship: dtr. Mrs. Lawrence     Emotional Assessment Appearance:: Appears stated age   Affect (typically observed): Unable to Assess Orientation: : Oriented to Self, Oriented to Place, Oriented to  Time, Oriented to Situation Alcohol / Substance Use: Not Applicable Psych Involvement: No (comment)  Admission diagnosis:  weakness Patient Active Problem List   Diagnosis Date Noted  . TIA (transient ischemic attack) 08/25/2018  . CVA (cerebral vascular accident) Acute subcentimeter infarction in the right Corpus Callosum 08/25/2018  . Chronic anticoagulation/Eliquis for Afib 08/25/2018  . Acute CVA (cerebrovascular accident) ([place]) 08/25/2018  . CAP (community acquired pneumonia) 02/24/2017  . Acute respiratory failure with hypoxia ([place]) 02/23/2017  .  Bronchitis 02/22/2017  . Hypoxia 02/22/2017  . Generalized weakness 02/22/2017  . RAD (reactive airway disease) with wheezing, mild intermittent, with acute exacerbation 02/22/2017  . Chronic pain 02/22/2017  . Vulvar dystrophy 05/23/2014  . Atrial fibrillation ([place]) 08/10/2013  . Essential hypertension  07/04/2013  . Tachycardia 01/31/2013  . Dystrophy, vulva 05/04/2012  . Abdominal pain, epigastric 03/28/2012  . Dyspepsia 04/14/2011  . History of colon cancer 03/17/2011  . Diarrhea 03/17/2011  . HLD (hyperlipidemia) 11/30/2006  . BRONCHIECTASIS 11/30/2006  . RHEUMATOID ARTHRITIS 11/30/2006   PCP:  [name], MD Pharmacy:   [facility] [place], [place] AT [place] [address] [place] [address] Phone: [phone] Fax: [phone]  [place], [place] [facility] [address] [place] [address] [place] [address] Phone: [phone] Fax: [phone]     Social Determinants of Health ([place]) Interventions    Readmission Risk Interventions No flowsheet data found.

## 2018-08-27 DIAGNOSIS — Z7901 Long term (current) use of anticoagulants: Secondary | ICD-10-CM

## 2018-08-27 NOTE — Progress Notes (Signed)
Physical Therapy Treatment Patient Details Name: [name] MRN: [id number] DOB: September 26, 1933 Today's Date: 08/27/2018    History of Present Illness [name]  is a 85 y.o. female with past medical history relevant for chronic atrial fibrillation, HTN,, HLD, rheumatoid arthritis presented by EMS after episode of speech disturbance and altered mentation lasting 15 to 20 minutes. 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     PT Comments    Daughter present throughout session. Patient presented in bed and agreeable to therapy. Patient requested bathroom before walking in hallways. Ambulated 150 feet with RW with increased complaints of fatigue, back pain and leg weakness with increased distance. Bilateral lower extremities began to give way requiring close contact guard for safety. Patient returned to sit in recliner with legs elevated.  Patient would continue to benefit from skilled physical therapy in current environment and next venue to continue return to prior function and increase strength, endurance, balance, coordination, and functional mobility and gait skills such that she will be able to return home where she was living alone prior to admission.   Follow Up Recommendations  SNF;Supervision for mobility/OOB     Equipment Recommendations  None recommended by PT    Recommendations for Other Services       Precautions / Restrictions Precautions Precautions: Fall Restrictions Weight Bearing Restrictions: No    Mobility  Bed Mobility Overal bed mobility: Needs Assistance Bed Mobility: Rolling;Sidelying to Sit Rolling: Min guard;Supervision Sidelying to sit: Min assist;HOB elevated       General bed mobility comments: had difficulty sitting up at bedside due to c/o vertigo and weakness.  Transfers Overall transfer level: Needs assistance Equipment used: Rolling walker (2 wheeled) Transfers: Sit to/from [employer] Sit to Stand: Min assist;Min guard Stand pivot transfers: Min assist;Min guard       General transfer comment: slow labored movement  Ambulation/Gait Ambulation/Gait assistance: Mod assist;Min assist Gait Distance (Feet): 150 Feet Assistive device: Rolling walker (2 wheeled) Gait Pattern/deviations: Decreased step length - right;Decreased step length - left;Decreased stride length Gait velocity: decreased   General Gait Details: slow, somewhat labored ambulation w/ c/o fatigue; bilateral lower extremities began to give way with increased distanced ambulation with increased c/o back pain.   Stairs             Wheelchair Mobility    Modified Rankin (Stroke Patients Only)       Balance Overall balance assessment: Needs assistance Sitting-balance support: Feet supported;No upper extremity supported Sitting balance-Leahy Scale: Fair Sitting balance - Comments: seated at bedside   Standing balance support: Bilateral upper extremity supported;During functional activity Standing balance-Leahy Scale: Fair Standing balance comment: using RW                            Cognition Arousal/Alertness: Awake/alert Behavior During Therapy: WFL for tasks assessed/performed Overall Cognitive Status: Within Functional Limits for tasks assessed                                        Exercises      General Comments        Pertinent Vitals/Pain Pain Assessment: Faces Faces Pain Scale: Hurts little more Pain Location: mid back with legs getting weaker with longer distanced ambulation with RW. Pain Descriptors / Indicators: Aching Pain Intervention(s): Limited activity within  patient's tolerance;Monitored during session;Premedicated before session    Home Living                      Prior Function            PT Goals (current goals can now be found in the care plan section) Progress towards PT goals: Progressing toward  goals    Frequency    7X/week      PT Plan Current plan remains appropriate    Co-evaluation              AM-PAC PT "6 Clicks" Mobility   Outcome Measure  Help needed turning from your back to your side while in a flat bed without using bedrails?: A Little Help needed moving from lying on your back to sitting on the side of a flat bed without using bedrails?: A Lot Help needed moving to and from a bed to a chair (including a wheelchair)?: A Little Help needed standing up from a chair using your arms (e.g., wheelchair or bedside chair)?: A Little Help needed to walk in hospital room?: A Little Help needed climbing 3-5 steps with a railing? : A Lot 6 Click Score: 16    End of Session Equipment Utilized During Treatment: Gait belt Activity Tolerance: Patient tolerated treatment well;Patient limited by fatigue;Patient limited by pain Patient left: in chair;with call bell/phone within reach;with family/visitor present Nurse Communication: Mobility status PT Visit Diagnosis: Unsteadiness on feet (R26.81);Other abnormalities of gait and mobility (R26.89);Muscle weakness (generalized) (M62.81);History of falling (Z91.81)     Time: 6553-7482 PT Time Calculation (min) (ACUTE ONLY): 28 min  Charges:  $Gait Training: 8-22 mins $Therapeutic Activity: 8-22 mins                     Floria Raveling. Hartnett-Rands, MS, PT Per [facility] [phone] 08/27/2018, 11:18 AM

## 2018-08-27 NOTE — Progress Notes (Addendum)
Patient Demographics:    [name], is a 85 y.o. female, DOB - 11-10-1933, GDJ:[id number]  Admit date - 08/25/2018   Admitting Physician Jackqueline Aquilar Denton Brick, MD  Outpatient Primary MD for the patient is [name], MD  LOS - 2   Chief Complaint  Patient presents with   expressive aphasia        Subjective:    [name] today has no fevers, no emesis,  No chest pain,  -Resting comfortably, -Complains of generalized weakness   Assessment  & Plan :    Principal Problem:   CVA (cerebral vascular accident) Acute subcentimeter infarction in the right Corpus Callosum Active Problems:   Chronic anticoagulation/Eliquis for Afib   Atrial fibrillation ([place])   HLD (hyperlipidemia)   Essential hypertension   Acute CVA (cerebrovascular accident) [facility])  Brief summary 85 y.o. female with past medical history relevant for chronic atrial fibrillation (on Eliquis for anticoagulation), HTN,, HLD, rheumatoid arthritis admitted with expressive aphasia and found to have acute stroke of the right corpus callosum- -awaiting insurance approval for SNF rehab  A/p 1)Acute CVA -right corpus callosum CVA with expressive aphasia that is now resolved-- place on telemetry monitored -patient remains in A. fib -Patient received aspirin, given recurrent episodes of expressive aphasia and left-sided deficits discussed with Dr. Simone Curia the neurologist, he advised continue aspirin 81 mg along with Eliquis 2.5 mg twice daily in the setting of A. Fib --- MRI brain shows acute subcentimeter infarction in the right MRA head and neck without LVO -echocardiogram from 08/23/2018 with preserved EF of 60 to 65% without acute findings.    --Direct LDL 177 - triglycerides are 608, total cholesterol is 248 and HDL is 33, non-HDL cholesterol is 215 -A1c 5.3 -TSH is 0.87 -PT  eval appreciated , SNF rehab recommended  2)  chronic atrial fibrillation--continue Eliquis 2.5 mg twice daily for secondary stroke prevention-- --Continue Toprol-XL for rate control 50 mg for rate control, unable to titrate up due to soft BP  3)RA---At baseline she is able to walk short distances with walking/assistive device, otherwise she uses wheelchair due to previous back surgeries and rheumatoid arthritis -Continue Arava  4) social/ethics--- plan of care discussed with patient and daughter [name] , she is a full code  Disposition/Need for in-Hospital Stay- patient unable to be discharged at this time due to recurrent neurological events deficits with recurrent expressive aphasia and recurrent left-sided deficits---  -Awaiting insurance approval for discharge to SNF rehab  Code Status : full  Family Communication:    (patient is alert, awake and coherent) Discussed with daughter   Disposition Plan  : Home with home health  Consults  : Neurology  DVT Prophylaxis  : Eliquis- SCDs   Lab Results  Component Value Date   PLT 222 08/26/2018    Inpatient Medications  Scheduled Meds:  apixaban  2.5 mg Oral BID   aspirin  81 mg Oral Daily   atorvastatin  80 mg Oral q1800   cholecalciferol  2,000 Units Oral BID   leflunomide  20 mg Oral Daily   metoprolol succinate  50 mg Oral Daily   pantoprazole  40 mg Oral Daily   sodium chloride flush  3 mL Intravenous Q12H   Continuous Infusions:  sodium  chloride     PRN Meds:.sodium chloride, acetaminophen **OR** acetaminophen, HYDROcodone-acetaminophen, labetalol, ondansetron **OR** ondansetron (ZOFRAN) IV, polyethylene glycol, sodium chloride flush, traZODone    Anti-infectives (From admission, onward)   None        Objective:   Vitals:   08/27/18 0501 08/27/18 0900 08/27/18 1300 08/27/18 1700  BP: (!) 121/56 (!) 98/51 103/63 (!) 108/58  Pulse: 87 87 85 83  Resp: 18 18 18 18   Temp: 97.7 F (36.5 C)  98.2 F (36.8 C) 98.6 F (37 C)  TempSrc: Oral  Oral  Oral  SpO2: (!) 89% 92% 93% 92%  Weight:      Height:        Wt Readings from Last 3 Encounters:  08/25/18 54.4 kg  08/22/18 54.4 kg  04/07/17 54 kg     Intake/Output Summary (Last 24 hours) at 08/27/2018 1817 Last data filed at 08/27/2018 1300 Gross per 24 hour  Intake 360 ml  Output --  Net 360 ml    Physical Exam Gen:- Awake Alert, in no apparent distress  HEENT:- [place].AT, No sclera icterus Mouth-no upper teeth Neck-Supple Neck,No JVD,.  Lungs-  CTAB , fair symmetrical air movement CV- S1, S2 normal, irregularly irregular Abd-  +ve B.Sounds, Abd Soft, No tenderness,    Extremity/Skin:- No  edema, pedal pulses present  Psych-affect is appropriate, oriented x3 Neuro-generalized weakness,, At baseline she is able to walk short distances with walking/assistive device, otherwise she uses wheelchair due to previous back surgeries and rheumatoid arthritis    Data Review:   Micro Results Recent Results (from the past 240 hour(s))  SARS Coronavirus 2 [facility] order, Performed in [facility] hospital lab) Nasopharyngeal Nasopharyngeal Swab     Status: None   Collection Time: 08/25/18  1:24 PM   Specimen: Nasopharyngeal Swab  Result Value Ref Range Status   SARS Coronavirus 2 NEGATIVE NEGATIVE Final    Comment: (NOTE) If result is NEGATIVE SARS-CoV-2 target nucleic acids are NOT DETECTED. The SARS-CoV-2 RNA is generally detectable in upper and lower  respiratory specimens during the acute phase of infection. The lowest  concentration of SARS-CoV-2 viral copies this assay can detect is 250  copies / mL. A negative result does not preclude SARS-CoV-2 infection  and should not be used as the sole basis for treatment or other  patient management decisions.  A negative result may occur with  improper specimen collection / handling, submission of specimen other  than nasopharyngeal swab, presence of viral mutation(s) within the  areas targeted by this assay, and inadequate number  of viral copies  (<250 copies / mL). A negative result must be combined with clinical  observations, patient history, and epidemiological information. If result is POSITIVE SARS-CoV-2 target nucleic acids are DETECTED. The SARS-CoV-2 RNA is generally detectable in upper and lower  respiratory specimens dur ing the acute phase of infection.  Positive  results are indicative of active infection with SARS-CoV-2.  Clinical  correlation with patient history and other diagnostic information is  necessary to determine patient infection status.  Positive results do  not rule out bacterial infection or co-infection with other viruses. If result is PRESUMPTIVE POSTIVE SARS-CoV-2 nucleic acids MAY BE PRESENT.   A presumptive positive result was obtained on the submitted specimen  and confirmed on repeat testing.  While 2019 novel coronavirus  (SARS-CoV-2) nucleic acids may be present in the submitted sample  additional confirmatory testing may be necessary for epidemiological  and / or clinical management purposes  to differentiate between  SARS-CoV-2 and other Sarbecovirus currently known to infect humans.  If clinically indicated additional testing with an alternate test  methodology [phone]) is advised. The SARS-CoV-2 RNA is generally  detectable in upper and lower respiratory sp ecimens during the acute  phase of infection. The expected result is Negative. Fact Sheet for Patients:  StrictlyIdeas.no Fact Sheet for Healthcare Providers: BankingDealers.co.za This test is not yet approved or cleared by the [place] FDA and has been authorized for detection and/or diagnosis of SARS-CoV-2 by FDA under an Emergency Use Authorization (EUA).  This EUA will remain in effect (meaning this test can be used) for the duration of the COVID-19 declaration under Section 564(b)(1) of the Act, 21 U.S.C. section 360bbb-3(b)(1), unless the authorization is  terminated or revoked sooner. Performed at [facility], [address], [place], [place] [address]     Radiology Reports Dg Chest 2 View  Result Date: 08/25/2018 CLINICAL DATA:  Shortness of breath. Cough. EXAM: CHEST - 2 VIEW COMPARISON:  Radiograph 08/22/2018 FINDINGS: The cardiomediastinal contours are unchanged with aortic atherosclerosis. Heart is normal in size. Chronic elevation of right hemidiaphragm. Mild chronic interstitial coarsening is unchanged. Pulmonary vasculature is normal. No consolidation, pleural effusion, or pneumothorax. No acute osseous abnormalities are seen. Vertebroplasty in the upper lumbar spine and postsurgical change in the cervical spine, partially included. IMPRESSION: 1. No acute chest finding. 2.  Aortic Atherosclerosis (ICD10-I70.0). Electronically Signed   By: Keith Rake M.D.   On: 08/25/2018 12:39   Dg Chest 2 View  Result Date: 08/22/2018 CLINICAL DATA:  Shortness of breath on exertion EXAM: CHEST - 2 VIEW COMPARISON:  02/24/2017 FINDINGS: Elevation of the right hemidiaphragm is noted. The heart is within normal limits. Aortic calcifications are noted. No focal infiltrate or sizable effusion is seen. Changes of prior vertebral augmentation are noted. Postsurgical changes in the cervical spine are seen as well. IMPRESSION: No acute abnormality noted. Electronically Signed   By: Inez Catalina M.D.   On: 08/22/2018 14:33   Ct Head Wo Contrast  Result Date: 08/25/2018 CLINICAL DATA:  Expressive aphasia EXAM: CT HEAD WITHOUT CONTRAST TECHNIQUE: Contiguous axial images were obtained from the base of the skull through the vertex without intravenous contrast. COMPARISON:  January 02, 2017 FINDINGS: Brain: Mild diffuse atrophy is stable. There is no intracranial mass, hemorrhage, extra-axial fluid collection, or midline shift. There is patchy periventricular small vessel disease in the centra semiovale bilaterally. No acute appearing infarct is evident on this  study. Vascular: There is no appreciable hyperdense vessel. There is calcification in each distal vertebral artery and carotid siphon region. Skull: Bony calvarium appears intact. Sinuses/Orbits: There is mucosal thickening in the left maxillary antrum with small air-fluid level. There is slight mucosal thickening in several ethmoid air cells. There is a 4 x 4 mm benign osteoma in the left ethmoid region. Orbits appear symmetric bilaterally. Other: Mastoid air cells are clear. IMPRESSION: Mild atrophy with patchy periventricular small vessel disease. No acute infarct. No mass or hemorrhage. There are foci of arterial vascular calcification. There are foci of relatively mild paranasal sinus disease. Electronically Signed   By: Lowella Grip III M.D.   On: 08/25/2018 12:21   Mr Angio Head Wo Contrast  Result Date: 08/25/2018 CLINICAL DATA:  Vertigo.  TIA.  Weakness.  Difficulty standing. EXAM: MRI HEAD WITHOUT CONTRAST MRA HEAD WITHOUT CONTRAST MRA NECK WITHOUT and with CONTRAST TECHNIQUE: Multiplanar, multiecho pulse sequences of the brain and surrounding structures were obtained without  intravenous contrast. Angiographic images of the Circle of Willis were obtained using MRA technique without intravenous contrast. Angiographic images of the neck were obtained using MRA technique without intravenous contrast. Carotid stenosis measurements (when applicable) are obtained utilizing NASCET criteria, using the distal internal carotid diameter as the denominator. COMPARISON:  None. CONTRAST:  6 cc Gadavist FINDINGS: MRI HEAD FINDINGS Brain: Diffusion imaging shows a subcentimeter acute infarction within the right splenium of the corpus callosum. No other acute infarction. There chronic small-vessel ischemic changes of the pons. There are old small vessel cerebellar infarctions. There are old small vessel infarctions affecting the thalami, basal ganglia and hemispheric white matter. No large vessel territory  infarction. No mass lesion, hemorrhage, hydrocephalus or extra-axial collection. Vascular: Major vessels at the base of the brain show flow. Skull and upper cervical spine: Negative Sinuses/Orbits: Mild mucosal inflammatory changes of the left maxillary sinus. Other sinuses clear. Orbits negative. Other: None MRA HEAD FINDINGS Both internal carotid arteries are widely patent into the brain. No siphon stenosis. The anterior and middle cerebral vessels are patent without proximal stenosis, aneurysm or vascular malformation. Both vertebral arteries are widely patent to the basilar. No basilar stenosis. Posterior circulation branch vessels appear normal. MRA NECK FINDINGS Branching pattern of the brachiocephalic vessels from the arch is normal. No origin stenosis. Both common carotid arteries are widely patent their respective bifurcation. There is mild atherosclerotic disease at both carotid bifurcations. 20% stenosis of the proximal ICA on the right. No measurable stenosis on the left. Cervical internal carotid arteries widely patent beyond that. Both vertebral arteries are widely patent at their origins and through the cervical region to the foramen magnum and basilar. IMPRESSION: Background pattern of chronic small vessel ischemic changes throughout the brain. Acute subcentimeter infarction in the right splenium of the corpus callosum which could be symptomatic. No sign of hemorrhage or mass effect. Atherosclerotic change of the carotid bifurcations. 20% stenosis of the proximal ICA on the right. No stenosis on the left. Negative intracranial MR angiography. No large or medium vessel occlusion. Electronically Signed   By: Nelson Chimes M.D.   On: 08/25/2018 15:58   Mr Angio Neck W Wo Contrast  Result Date: 08/25/2018 CLINICAL DATA:  Vertigo.  TIA.  Weakness.  Difficulty standing. EXAM: MRI HEAD WITHOUT CONTRAST MRA HEAD WITHOUT CONTRAST MRA NECK WITHOUT and with CONTRAST TECHNIQUE: Multiplanar, multiecho pulse  sequences of the brain and surrounding structures were obtained without intravenous contrast. Angiographic images of the Circle of Willis were obtained using MRA technique without intravenous contrast. Angiographic images of the neck were obtained using MRA technique without intravenous contrast. Carotid stenosis measurements (when applicable) are obtained utilizing NASCET criteria, using the distal internal carotid diameter as the denominator. COMPARISON:  None. CONTRAST:  6 cc Gadavist FINDINGS: MRI HEAD FINDINGS Brain: Diffusion imaging shows a subcentimeter acute infarction within the right splenium of the corpus callosum. No other acute infarction. There chronic small-vessel ischemic changes of the pons. There are old small vessel cerebellar infarctions. There are old small vessel infarctions affecting the thalami, basal ganglia and hemispheric white matter. No large vessel territory infarction. No mass lesion, hemorrhage, hydrocephalus or extra-axial collection. Vascular: Major vessels at the base of the brain show flow. Skull and upper cervical spine: Negative Sinuses/Orbits: Mild mucosal inflammatory changes of the left maxillary sinus. Other sinuses clear. Orbits negative. Other: None MRA HEAD FINDINGS Both internal carotid arteries are widely patent into the brain. No siphon stenosis. The anterior and middle cerebral vessels are patent  without proximal stenosis, aneurysm or vascular malformation. Both vertebral arteries are widely patent to the basilar. No basilar stenosis. Posterior circulation branch vessels appear normal. MRA NECK FINDINGS Branching pattern of the brachiocephalic vessels from the arch is normal. No origin stenosis. Both common carotid arteries are widely patent their respective bifurcation. There is mild atherosclerotic disease at both carotid bifurcations. 20% stenosis of the proximal ICA on the right. No measurable stenosis on the left. Cervical internal carotid arteries widely patent  beyond that. Both vertebral arteries are widely patent at their origins and through the cervical region to the foramen magnum and basilar. IMPRESSION: Background pattern of chronic small vessel ischemic changes throughout the brain. Acute subcentimeter infarction in the right splenium of the corpus callosum which could be symptomatic. No sign of hemorrhage or mass effect. Atherosclerotic change of the carotid bifurcations. 20% stenosis of the proximal ICA on the right. No stenosis on the left. Negative intracranial MR angiography. No large or medium vessel occlusion. Electronically Signed   By: Nelson Chimes M.D.   On: 08/25/2018 15:58   Mr Brain Wo Contrast  Result Date: 08/25/2018 CLINICAL DATA:  Vertigo.  TIA.  Weakness.  Difficulty standing. EXAM: MRI HEAD WITHOUT CONTRAST MRA HEAD WITHOUT CONTRAST MRA NECK WITHOUT and with CONTRAST TECHNIQUE: Multiplanar, multiecho pulse sequences of the brain and surrounding structures were obtained without intravenous contrast. Angiographic images of the Circle of Willis were obtained using MRA technique without intravenous contrast. Angiographic images of the neck were obtained using MRA technique without intravenous contrast. Carotid stenosis measurements (when applicable) are obtained utilizing NASCET criteria, using the distal internal carotid diameter as the denominator. COMPARISON:  None. CONTRAST:  6 cc Gadavist FINDINGS: MRI HEAD FINDINGS Brain: Diffusion imaging shows a subcentimeter acute infarction within the right splenium of the corpus callosum. No other acute infarction. There chronic small-vessel ischemic changes of the pons. There are old small vessel cerebellar infarctions. There are old small vessel infarctions affecting the thalami, basal ganglia and hemispheric white matter. No large vessel territory infarction. No mass lesion, hemorrhage, hydrocephalus or extra-axial collection. Vascular: Major vessels at the base of the brain show flow. Skull and  upper cervical spine: Negative Sinuses/Orbits: Mild mucosal inflammatory changes of the left maxillary sinus. Other sinuses clear. Orbits negative. Other: None MRA HEAD FINDINGS Both internal carotid arteries are widely patent into the brain. No siphon stenosis. The anterior and middle cerebral vessels are patent without proximal stenosis, aneurysm or vascular malformation. Both vertebral arteries are widely patent to the basilar. No basilar stenosis. Posterior circulation branch vessels appear normal. MRA NECK FINDINGS Branching pattern of the brachiocephalic vessels from the arch is normal. No origin stenosis. Both common carotid arteries are widely patent their respective bifurcation. There is mild atherosclerotic disease at both carotid bifurcations. 20% stenosis of the proximal ICA on the right. No measurable stenosis on the left. Cervical internal carotid arteries widely patent beyond that. Both vertebral arteries are widely patent at their origins and through the cervical region to the foramen magnum and basilar. IMPRESSION: Background pattern of chronic small vessel ischemic changes throughout the brain. Acute subcentimeter infarction in the right splenium of the corpus callosum which could be symptomatic. No sign of hemorrhage or mass effect. Atherosclerotic change of the carotid bifurcations. 20% stenosis of the proximal ICA on the right. No stenosis on the left. Negative intracranial MR angiography. No large or medium vessel occlusion. Electronically Signed   By: Nelson Chimes M.D.   On: 08/25/2018 15:58  CBC Recent Labs  Lab 08/22/18 1013 08/25/18 1237 08/26/18 0545  WBC 5.2 5.5 5.9  HGB 12.6 12.7 12.9  HCT 39.6 40.4 41.9  PLT 205 195 222  MCV 88.4 88.0 88.2  MCH 28.1 27.7 27.2  MCHC 31.8 31.4 30.8  RDW 14.8 15.1 15.1    Chemistries  Recent Labs  Lab 08/22/18 1013 08/25/18 1237 08/26/18 0545  NA 138 140 140  K 3.6 4.0 3.5  CL 103 104 103  CO2 26 27 25   GLUCOSE 106* 101* 123*   BUN 11 11 13   CREATININE 0.54 0.63 0.81  CALCIUM 8.7* 8.6* 8.8*  AST  --  22  --   ALT  --  13  --   ALKPHOS  --  60  --   BILITOT  --  0.6  --    ------------------------------------------------------------------------------------------------------------------ Recent Labs    08/26/18 0545  CHOL 248*  HDL 33*  LDLCALC UNABLE TO CALCULATE IF TRIGLYCERIDE OVER 400 mg/dL  TRIG 608*  CHOLHDL 7.5  LDLDIRECT 177.9*    Lab Results  Component Value Date   HGBA1C 5.3 08/25/2018   ------------------------------------------------------------------------------------------------------------------ Recent Labs    08/25/18 1431  TSH 0.877   ------------------------------------------------------------------------------------------------------------------ No results for input(s): VITAMINB12, FOLATE, FERRITIN, TIBC, IRON, RETICCTPCT in the last 72 hours.  Coagulation profile No results for input(s): INR, PROTIME in the last 168 hours.  No results for input(s): DDIMER in the last 72 hours.  Cardiac Enzymes No results for input(s): CKMB, TROPONINI, MYOGLOBIN in the last 168 hours.  Invalid input(s): CK ------------------------------------------------------------------------------------------------------------------    Component Value Date/Time   BNP 298.0 (H) 08/22/2018 1013     Roxan Hockey M.D on 08/27/2018 at 6:17 PM  Go to [email or web address] - for contact info  [facility] - Office  [phone]

## 2018-08-28 NOTE — Progress Notes (Signed)
Physical Therapy Treatment Patient Details Name: [name] MRN: [id number] DOB: 1933/02/03 Today's Date: 08/28/2018    History of Present Illness [name]  is a 85 y.o. female with past medical history relevant for chronic atrial fibrillation, HTN,, HLD, rheumatoid arthritis presented by EMS after episode of speech disturbance and altered mentation lasting 15 to 20 minutes. 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     PT Comments    Patient performed well today. She was able to ambulate further (250 feet) without complaints of back pain or leg weakness and patient was able to converse throughout ambulation distance without signs of fatigue or loss of balance. Patient reports she has 4 children that are in daily contact with her either by phone or by stopping by her home.  Therefore, PT is changing DC recommendations to home with HHPT. Patient would continue to benefit from skilled physical therapy in current environment and next venue to continue return to prior function and increase strength, endurance, balance, coordination, and functional mobility and gait skills.     Follow Up Recommendations  Supervision - Intermittent;Home health PT     Equipment Recommendations  None recommended by PT;Other (comment)(Patient may benefit from Rollator at home versus walking while pulling wheelchair behind her. HHPT might trial this in the home.)    Recommendations for Other Services       Precautions / Restrictions Precautions Precautions: Fall Restrictions Weight Bearing Restrictions: No    Mobility  Bed Mobility Overal bed mobility: Modified Independent       Supine to sit: Modified independent (Device/Increase time)        Transfers Overall transfer level: Needs assistance Equipment used: Rolling walker (2 wheeled) Transfers: Sit to/from [facility] Sit to Stand: Supervision Stand pivot transfers:  Supervision       General transfer comment: patient was sure to immediately reach for RW upon standing for support and steadiness  Ambulation/Gait Ambulation/Gait assistance: Supervision Gait Distance (Feet): 250 Feet Assistive device: Rolling walker (2 wheeled) Gait Pattern/deviations: Decreased step length - right;Decreased step length - left;Decreased stride length Gait velocity: decreased   General Gait Details: somewhat slow ambulation with verbal cues to walker within RW w/ more upright posture; no c/o back pain or leg weakness, patient able to maintain conversation throughout ambulation distance.   Stairs             Wheelchair Mobility    Modified Rankin (Stroke Patients Only)       Balance Overall balance assessment: Needs assistance Sitting-balance support: Feet supported;No upper extremity supported Sitting balance-Leahy Scale: Good Sitting balance - Comments: seated at bedside   Standing balance support: Bilateral upper extremity supported;During functional activity Standing balance-Leahy Scale: Fair Standing balance comment: using RW                            Cognition Arousal/Alertness: Awake/alert Behavior During Therapy: WFL for tasks assessed/performed Overall Cognitive Status: Within Functional Limits for tasks assessed                                        Exercises General Exercises - Upper Extremity Shoulder Flexion: AROM;Strengthening;Both;10 reps General Exercises - Lower Extremity Ankle Circles/Pumps: AROM;Strengthening;Both;10 reps;Seated Long Arc Quad: AROM;Strengthening;Both;10 reps;Seated Hip Flexion/Marching: AROM;Strengthening;Both;10 reps;Seated    General Comments  Pertinent Vitals/Pain Pain Assessment: No/denies pain    Home Living Family/patient expects to be discharged to:: Private residence Living Arrangements: Alone Available Help at Discharge: Family;Available  PRN/intermittently Type of Home: House Home Access: Stairs to enter Entrance Stairs-Rails: Right Home Layout: One level Home Equipment: Walker - 2 wheels;Cane - single point;Shower seat;Wheelchair - manual      Prior Function Level of Independence: Needs assistance  Gait / Transfers Assistance Needed: short distanced household ambulation pulling wheelchair behind her, otherwise leans on nearby objects for support ADL's / Homemaking Assistance Needed: assisted by family PRN, but not available 24/7     PT Goals (current goals can now be found in the care plan section) Progress towards PT goals: Progressing toward goals    Frequency    7X/week      PT Plan Discharge plan needs to be updated    Co-evaluation              AM-PAC PT "6 Clicks" Mobility   Outcome Measure  Help needed turning from your back to your side while in a flat bed without using bedrails?: None Help needed moving from lying on your back to sitting on the side of a flat bed without using bedrails?: None Help needed moving to and from a bed to a chair (including a wheelchair)?: A Little Help needed standing up from a chair using your arms (e.g., wheelchair or bedside chair)?: A Little Help needed to walk in hospital room?: A Little Help needed climbing 3-5 steps with a railing? : A Little 6 Click Score: 20    End of Session Equipment Utilized During Treatment: Gait belt Activity Tolerance: Patient tolerated treatment well Patient left: in chair;with call bell/phone within reach;with family/visitor present Nurse Communication: Mobility status PT Visit Diagnosis: Unsteadiness on feet (R26.81);Other abnormalities of gait and mobility (R26.89);Muscle weakness (generalized) (M62.81);History of falling (Z91.81)     Time: 1330-1400 PT Time Calculation (min) (ACUTE ONLY): 30 min  Charges:  $Gait Training: 8-22 mins $Therapeutic Exercise: 8-22 mins                     Floria Raveling. Hartnett-Rands, MS,  PT Per [facility] [phone] 08/28/2018, 2:12 PM

## 2018-08-28 NOTE — Progress Notes (Signed)
Patient Demographics:    [name], is a 85 y.o. female, DOB - 01/31/1933, TWK:[id number]  Admit date - 08/25/2018   Admitting Physician Nadeem Romanoski Denton Brick, MD  Outpatient Primary MD for the patient is [name], MD  LOS - 3   Chief Complaint  Patient presents with   expressive aphasia        Subjective:    [name] today has no fevers, no emesis,  No chest pain,  -No further acute neuro events -Complains of generalized weakness -Daughter at bedside, questions answered --Family concerned about return home as patient lives alone    Assessment  & Plan :    Principal Problem:   CVA (cerebral vascular accident) Acute subcentimeter infarction in the right Corpus Callosum Active Problems:   Chronic anticoagulation/Eliquis for Afib   Atrial fibrillation ([place])   HLD (hyperlipidemia)   Essential hypertension   Acute CVA (cerebrovascular accident) [facility])  Brief summary 85 y.o. female with past medical history relevant for chronic atrial fibrillation (on Eliquis for anticoagulation), HTN,, HLD, rheumatoid arthritis admitted with expressive aphasia and found to have acute stroke of the right corpus callosum- -awaiting insurance approval for SNF rehab  A/p 1)Acute CVA -right corpus callosum CVA with expressive aphasia that is now resolved-- place on telemetry monitored -patient remains in A. fib -Patient received aspirin, given recurrent episodes of expressive aphasia and left-sided deficits discussed with Dr. Simone Curia the neurologist, he advised continue aspirin 81 mg along with Eliquis 2.5 mg twice daily in the setting of A. Fib --- MRI brain showed acute subcentimeter infarction in the right MRA head and neck without LVO -echocardiogram from 08/23/2018 with preserved EF of 60 to 65% without acute findings.    --Direct LDL 177, - triglycerides are 608, total cholesterol is 248 and HDL is  33, non-HDL cholesterol is 215 -A1c 5.3, -TSH is 0.87 -PT  eval appreciated, , SNF rehab recommended  2) chronic atrial fibrillation--continue Eliquis 2.5 mg twice daily for secondary stroke prevention-- --Continue Toprol-XL for rate control 50 mg for rate control, unable to titrate up due to soft BP  3)RA---At baseline she is able to walk short distances with walking/assistive device, otherwise she uses wheelchair due to previous back surgeries and rheumatoid arthritis -Continue Arava  4) social/ethics--- plan of care discussed with patient and daughter [name] , she is a full code  Disposition/Need for in-Hospital Stay- patient unable to be discharged at this time due to recurrent neurological events deficits with recurrent expressive aphasia and recurrent left-sided deficits---  -Awaiting insurance approval for discharge to SNF rehab  Code Status : full  Family Communication:    (patient is alert, awake and coherent) Discussed with daughter   Disposition Plan  : SNF rehab as previously recommended by physical therapy  Consults  : Neurology  DVT Prophylaxis  : Eliquis- SCDs   Lab Results  Component Value Date   PLT 222 08/26/2018    Inpatient Medications  Scheduled Meds:  apixaban  2.5 mg Oral BID   aspirin  81 mg Oral Daily   atorvastatin  80 mg Oral q1800   cholecalciferol  2,000 Units Oral BID   leflunomide  20 mg Oral Daily   metoprolol succinate  50 mg Oral Daily  pantoprazole  40 mg Oral Daily   sodium chloride flush  3 mL Intravenous Q12H   Continuous Infusions:  sodium chloride     PRN Meds:.sodium chloride, acetaminophen **OR** acetaminophen, HYDROcodone-acetaminophen, labetalol, ondansetron **OR** ondansetron (ZOFRAN) IV, polyethylene glycol, sodium chloride flush, traZODone    Anti-infectives (From admission, onward)   None        Objective:   Vitals:   08/28/18 0500 08/28/18 0900 08/28/18 0926 08/28/18 1516  BP: (!) 121/55 (!) 143/67  (!) 143/67 (!) 117/59  Pulse: 71 84 84 78  Resp: 18 18  16   Temp: 98.2 F (36.8 C) 98.5 F (36.9 C)  (!) 97.3 F (36.3 C)  TempSrc: Oral Oral  Oral  SpO2: 92% 94%  90%  Weight:      Height:        Wt Readings from Last 3 Encounters:  08/25/18 54.4 kg  08/22/18 54.4 kg  04/07/17 54 kg     Intake/Output Summary (Last 24 hours) at 08/28/2018 1748 Last data filed at 08/28/2018 1500 Gross per 24 hour  Intake 483 ml  Output --  Net 483 ml    Physical Exam Gen:- Awake Alert, in no apparent distress  HEENT:- [place].AT, No sclera icterus Ears-HOH Mouth-no upper teeth Neck-Supple Neck,No JVD,.  Lungs-  CTAB , fair symmetrical air movement CV- S1, S2 normal, irregularly irregular Abd-  +ve B.Sounds, Abd Soft, No tenderness,    Extremity/Skin:- No  edema, pedal pulses present  Psych-affect is appropriate, oriented x3 Neuro-generalized weakness, speech is back to baseline, no new focal deficits, At baseline she is able to walk short distances with walking/assistive device, otherwise she uses wheelchair due to previous back surgeries and rheumatoid arthritis     Data Review:   Micro Results Recent Results (from the past 240 hour(s))  SARS Coronavirus 2 [facility] order, Performed in [facility] hospital lab) Nasopharyngeal Nasopharyngeal Swab     Status: None   Collection Time: 08/25/18  1:24 PM   Specimen: Nasopharyngeal Swab  Result Value Ref Range Status   SARS Coronavirus 2 NEGATIVE NEGATIVE Final    Comment: (NOTE) If result is NEGATIVE SARS-CoV-2 target nucleic acids are NOT DETECTED. The SARS-CoV-2 RNA is generally detectable in upper and lower  respiratory specimens during the acute phase of infection. The lowest  concentration of SARS-CoV-2 viral copies this assay can detect is 250  copies / mL. A negative result does not preclude SARS-CoV-2 infection  and should not be used as the sole basis for treatment or other  patient management decisions.  A negative result may  occur with  improper specimen collection / handling, submission of specimen other  than nasopharyngeal swab, presence of viral mutation(s) within the  areas targeted by this assay, and inadequate number of viral copies  (<250 copies / mL). A negative result must be combined with clinical  observations, patient history, and epidemiological information. If result is POSITIVE SARS-CoV-2 target nucleic acids are DETECTED. The SARS-CoV-2 RNA is generally detectable in upper and lower  respiratory specimens dur ing the acute phase of infection.  Positive  results are indicative of active infection with SARS-CoV-2.  Clinical  correlation with patient history and other diagnostic information is  necessary to determine patient infection status.  Positive results do  not rule out bacterial infection or co-infection with other viruses. If result is PRESUMPTIVE POSTIVE SARS-CoV-2 nucleic acids MAY BE PRESENT.   A presumptive positive result was obtained on the submitted specimen  and confirmed on repeat  testing.  While 2019 novel coronavirus  (SARS-CoV-2) nucleic acids may be present in the submitted sample  additional confirmatory testing may be necessary for epidemiological  and / or clinical management purposes  to differentiate between  SARS-CoV-2 and other Sarbecovirus currently known to infect humans.  If clinically indicated additional testing with an alternate test  methodology [phone]) is advised. The SARS-CoV-2 RNA is generally  detectable in upper and lower respiratory sp ecimens during the acute  phase of infection. The expected result is Negative. Fact Sheet for Patients:  StrictlyIdeas.no Fact Sheet for Healthcare Providers: BankingDealers.co.za This test is not yet approved or cleared by the [place] FDA and has been authorized for detection and/or diagnosis of SARS-CoV-2 by FDA under an Emergency Use Authorization (EUA).  This  EUA will remain in effect (meaning this test can be used) for the duration of the COVID-19 declaration under Section 564(b)(1) of the Act, 21 U.S.C. section 360bbb-3(b)(1), unless the authorization is terminated or revoked sooner. Performed at [facility], [address], [place], [place] [address]     Radiology Reports Dg Chest 2 View  Result Date: 08/25/2018 CLINICAL DATA:  Shortness of breath. Cough. EXAM: CHEST - 2 VIEW COMPARISON:  Radiograph 08/22/2018 FINDINGS: The cardiomediastinal contours are unchanged with aortic atherosclerosis. Heart is normal in size. Chronic elevation of right hemidiaphragm. Mild chronic interstitial coarsening is unchanged. Pulmonary vasculature is normal. No consolidation, pleural effusion, or pneumothorax. No acute osseous abnormalities are seen. Vertebroplasty in the upper lumbar spine and postsurgical change in the cervical spine, partially included. IMPRESSION: 1. No acute chest finding. 2.  Aortic Atherosclerosis (ICD10-I70.0). Electronically Signed   By: Keith Rake M.D.   On: 08/25/2018 12:39   Dg Chest 2 View  Result Date: 08/22/2018 CLINICAL DATA:  Shortness of breath on exertion EXAM: CHEST - 2 VIEW COMPARISON:  02/24/2017 FINDINGS: Elevation of the right hemidiaphragm is noted. The heart is within normal limits. Aortic calcifications are noted. No focal infiltrate or sizable effusion is seen. Changes of prior vertebral augmentation are noted. Postsurgical changes in the cervical spine are seen as well. IMPRESSION: No acute abnormality noted. Electronically Signed   By: Inez Catalina M.D.   On: 08/22/2018 14:33   Ct Head Wo Contrast  Result Date: 08/25/2018 CLINICAL DATA:  Expressive aphasia EXAM: CT HEAD WITHOUT CONTRAST TECHNIQUE: Contiguous axial images were obtained from the base of the skull through the vertex without intravenous contrast. COMPARISON:  January 02, 2017 FINDINGS: Brain: Mild diffuse atrophy is stable. There is no intracranial  mass, hemorrhage, extra-axial fluid collection, or midline shift. There is patchy periventricular small vessel disease in the centra semiovale bilaterally. No acute appearing infarct is evident on this study. Vascular: There is no appreciable hyperdense vessel. There is calcification in each distal vertebral artery and carotid siphon region. Skull: Bony calvarium appears intact. Sinuses/Orbits: There is mucosal thickening in the left maxillary antrum with small air-fluid level. There is slight mucosal thickening in several ethmoid air cells. There is a 4 x 4 mm benign osteoma in the left ethmoid region. Orbits appear symmetric bilaterally. Other: Mastoid air cells are clear. IMPRESSION: Mild atrophy with patchy periventricular small vessel disease. No acute infarct. No mass or hemorrhage. There are foci of arterial vascular calcification. There are foci of relatively mild paranasal sinus disease. Electronically Signed   By: Lowella Grip III M.D.   On: 08/25/2018 12:21   Mr Angio Head Wo Contrast  Result Date: 08/25/2018 CLINICAL DATA:  Vertigo.  TIA.  Weakness.  Difficulty standing. EXAM: MRI HEAD WITHOUT CONTRAST MRA HEAD WITHOUT CONTRAST MRA NECK WITHOUT and with CONTRAST TECHNIQUE: Multiplanar, multiecho pulse sequences of the brain and surrounding structures were obtained without intravenous contrast. Angiographic images of the Circle of Willis were obtained using MRA technique without intravenous contrast. Angiographic images of the neck were obtained using MRA technique without intravenous contrast. Carotid stenosis measurements (when applicable) are obtained utilizing NASCET criteria, using the distal internal carotid diameter as the denominator. COMPARISON:  None. CONTRAST:  6 cc Gadavist FINDINGS: MRI HEAD FINDINGS Brain: Diffusion imaging shows a subcentimeter acute infarction within the right splenium of the corpus callosum. No other acute infarction. There chronic small-vessel ischemic changes  of the pons. There are old small vessel cerebellar infarctions. There are old small vessel infarctions affecting the thalami, basal ganglia and hemispheric white matter. No large vessel territory infarction. No mass lesion, hemorrhage, hydrocephalus or extra-axial collection. Vascular: Major vessels at the base of the brain show flow. Skull and upper cervical spine: Negative Sinuses/Orbits: Mild mucosal inflammatory changes of the left maxillary sinus. Other sinuses clear. Orbits negative. Other: None MRA HEAD FINDINGS Both internal carotid arteries are widely patent into the brain. No siphon stenosis. The anterior and middle cerebral vessels are patent without proximal stenosis, aneurysm or vascular malformation. Both vertebral arteries are widely patent to the basilar. No basilar stenosis. Posterior circulation branch vessels appear normal. MRA NECK FINDINGS Branching pattern of the brachiocephalic vessels from the arch is normal. No origin stenosis. Both common carotid arteries are widely patent their respective bifurcation. There is mild atherosclerotic disease at both carotid bifurcations. 20% stenosis of the proximal ICA on the right. No measurable stenosis on the left. Cervical internal carotid arteries widely patent beyond that. Both vertebral arteries are widely patent at their origins and through the cervical region to the foramen magnum and basilar. IMPRESSION: Background pattern of chronic small vessel ischemic changes throughout the brain. Acute subcentimeter infarction in the right splenium of the corpus callosum which could be symptomatic. No sign of hemorrhage or mass effect. Atherosclerotic change of the carotid bifurcations. 20% stenosis of the proximal ICA on the right. No stenosis on the left. Negative intracranial MR angiography. No large or medium vessel occlusion. Electronically Signed   By: Nelson Chimes M.D.   On: 08/25/2018 15:58   Mr Angio Neck W Wo Contrast  Result Date:  08/25/2018 CLINICAL DATA:  Vertigo.  TIA.  Weakness.  Difficulty standing. EXAM: MRI HEAD WITHOUT CONTRAST MRA HEAD WITHOUT CONTRAST MRA NECK WITHOUT and with CONTRAST TECHNIQUE: Multiplanar, multiecho pulse sequences of the brain and surrounding structures were obtained without intravenous contrast. Angiographic images of the Circle of Willis were obtained using MRA technique without intravenous contrast. Angiographic images of the neck were obtained using MRA technique without intravenous contrast. Carotid stenosis measurements (when applicable) are obtained utilizing NASCET criteria, using the distal internal carotid diameter as the denominator. COMPARISON:  None. CONTRAST:  6 cc Gadavist FINDINGS: MRI HEAD FINDINGS Brain: Diffusion imaging shows a subcentimeter acute infarction within the right splenium of the corpus callosum. No other acute infarction. There chronic small-vessel ischemic changes of the pons. There are old small vessel cerebellar infarctions. There are old small vessel infarctions affecting the thalami, basal ganglia and hemispheric white matter. No large vessel territory infarction. No mass lesion, hemorrhage, hydrocephalus or extra-axial collection. Vascular: Major vessels at the base of the brain show flow. Skull and upper cervical spine: Negative Sinuses/Orbits: Mild mucosal inflammatory changes of the left  maxillary sinus. Other sinuses clear. Orbits negative. Other: None MRA HEAD FINDINGS Both internal carotid arteries are widely patent into the brain. No siphon stenosis. The anterior and middle cerebral vessels are patent without proximal stenosis, aneurysm or vascular malformation. Both vertebral arteries are widely patent to the basilar. No basilar stenosis. Posterior circulation branch vessels appear normal. MRA NECK FINDINGS Branching pattern of the brachiocephalic vessels from the arch is normal. No origin stenosis. Both common carotid arteries are widely patent their respective  bifurcation. There is mild atherosclerotic disease at both carotid bifurcations. 20% stenosis of the proximal ICA on the right. No measurable stenosis on the left. Cervical internal carotid arteries widely patent beyond that. Both vertebral arteries are widely patent at their origins and through the cervical region to the foramen magnum and basilar. IMPRESSION: Background pattern of chronic small vessel ischemic changes throughout the brain. Acute subcentimeter infarction in the right splenium of the corpus callosum which could be symptomatic. No sign of hemorrhage or mass effect. Atherosclerotic change of the carotid bifurcations. 20% stenosis of the proximal ICA on the right. No stenosis on the left. Negative intracranial MR angiography. No large or medium vessel occlusion. Electronically Signed   By: Nelson Chimes M.D.   On: 08/25/2018 15:58   Mr Brain Wo Contrast  Result Date: 08/25/2018 CLINICAL DATA:  Vertigo.  TIA.  Weakness.  Difficulty standing. EXAM: MRI HEAD WITHOUT CONTRAST MRA HEAD WITHOUT CONTRAST MRA NECK WITHOUT and with CONTRAST TECHNIQUE: Multiplanar, multiecho pulse sequences of the brain and surrounding structures were obtained without intravenous contrast. Angiographic images of the Circle of Willis were obtained using MRA technique without intravenous contrast. Angiographic images of the neck were obtained using MRA technique without intravenous contrast. Carotid stenosis measurements (when applicable) are obtained utilizing NASCET criteria, using the distal internal carotid diameter as the denominator. COMPARISON:  None. CONTRAST:  6 cc Gadavist FINDINGS: MRI HEAD FINDINGS Brain: Diffusion imaging shows a subcentimeter acute infarction within the right splenium of the corpus callosum. No other acute infarction. There chronic small-vessel ischemic changes of the pons. There are old small vessel cerebellar infarctions. There are old small vessel infarctions affecting the thalami, basal  ganglia and hemispheric white matter. No large vessel territory infarction. No mass lesion, hemorrhage, hydrocephalus or extra-axial collection. Vascular: Major vessels at the base of the brain show flow. Skull and upper cervical spine: Negative Sinuses/Orbits: Mild mucosal inflammatory changes of the left maxillary sinus. Other sinuses clear. Orbits negative. Other: None MRA HEAD FINDINGS Both internal carotid arteries are widely patent into the brain. No siphon stenosis. The anterior and middle cerebral vessels are patent without proximal stenosis, aneurysm or vascular malformation. Both vertebral arteries are widely patent to the basilar. No basilar stenosis. Posterior circulation branch vessels appear normal. MRA NECK FINDINGS Branching pattern of the brachiocephalic vessels from the arch is normal. No origin stenosis. Both common carotid arteries are widely patent their respective bifurcation. There is mild atherosclerotic disease at both carotid bifurcations. 20% stenosis of the proximal ICA on the right. No measurable stenosis on the left. Cervical internal carotid arteries widely patent beyond that. Both vertebral arteries are widely patent at their origins and through the cervical region to the foramen magnum and basilar. IMPRESSION: Background pattern of chronic small vessel ischemic changes throughout the brain. Acute subcentimeter infarction in the right splenium of the corpus callosum which could be symptomatic. No sign of hemorrhage or mass effect. Atherosclerotic change of the carotid bifurcations. 20% stenosis of the proximal ICA on  the right. No stenosis on the left. Negative intracranial MR angiography. No large or medium vessel occlusion. Electronically Signed   By: Nelson Chimes M.D.   On: 08/25/2018 15:58    CBC Recent Labs  Lab 08/22/18 1013 08/25/18 1237 08/26/18 0545  WBC 5.2 5.5 5.9  HGB 12.6 12.7 12.9  HCT 39.6 40.4 41.9  PLT 205 195 222  MCV 88.4 88.0 88.2  MCH 28.1 27.7 27.2   MCHC 31.8 31.4 30.8  RDW 14.8 15.1 15.1    Chemistries  Recent Labs  Lab 08/22/18 1013 08/25/18 1237 08/26/18 0545  NA 138 140 140  K 3.6 4.0 3.5  CL 103 104 103  CO2 26 27 25   GLUCOSE 106* 101* 123*  BUN 11 11 13   CREATININE 0.54 0.63 0.81  CALCIUM 8.7* 8.6* 8.8*  AST  --  22  --   ALT  --  13  --   ALKPHOS  --  60  --   BILITOT  --  0.6  --    ------------------------------------------------------------------------------------------------------------------ Recent Labs    08/26/18 0545  CHOL 248*  HDL 33*  LDLCALC UNABLE TO CALCULATE IF TRIGLYCERIDE OVER 400 mg/dL  TRIG 608*  CHOLHDL 7.5  LDLDIRECT 177.9*    Lab Results  Component Value Date   HGBA1C 5.3 08/25/2018   ------------------------------------------------------------------------------------------------------------------ No results for input(s): TSH, T4TOTAL, T3FREE, THYROIDAB in the last 72 hours.  Invalid input(s): FREET3 ------------------------------------------------------------------------------------------------------------------ No results for input(s): VITAMINB12, FOLATE, FERRITIN, TIBC, IRON, RETICCTPCT in the last 72 hours.  Coagulation profile No results for input(s): INR, PROTIME in the last 168 hours.  No results for input(s): DDIMER in the last 72 hours.  Cardiac Enzymes No results for input(s): CKMB, TROPONINI, MYOGLOBIN in the last 168 hours.  Invalid input(s): CK ------------------------------------------------------------------------------------------------------------------    Component Value Date/Time   BNP 298.0 (H) 08/22/2018 1013     Roxan Hockey M.D on 08/28/2018 at 5:48 PM  Go to [email or web address] - for contact info  [facility] - Office  [phone]

## 2018-08-29 ENCOUNTER — Inpatient Hospital Stay (HOSPITAL_COMMUNITY)
Admit: 2018-08-29 | Discharge: 2018-08-29 | Disposition: A | Discharge status: Home / Self Care | Payer: Medicare Other | Attending: Family Medicine | Admitting: Family Medicine

## 2018-08-29 DIAGNOSIS — E785 Hyperlipidemia, unspecified: Secondary | ICD-10-CM

## 2018-08-29 DIAGNOSIS — G4733 Obstructive sleep apnea (adult) (pediatric): Secondary | ICD-10-CM | POA: Diagnosis not present

## 2018-08-29 LAB — CBC
HCT: 37.3 % (ref 36.0–46.0)
Hemoglobin: 11.6 g/dL — ABNORMAL LOW (ref 12.0–15.0)
MCH: 27.6 pg (ref 26.0–34.0)
MCHC: 31.1 g/dL (ref 30.0–36.0)
MCV: 88.6 fL (ref 80.0–100.0)
Platelets: 191 10*3/uL (ref 150–400)
RBC: 4.21 MIL/uL (ref 3.87–5.11)
RDW: 14.9 % (ref 11.5–15.5)
WBC: 6.1 10*3/uL (ref 4.0–10.5)
nRBC: 0 % (ref 0.0–0.2)

## 2018-08-29 MED ORDER — LOPERAMIDE HCL 2 MG PO CAPS
4.0000 mg | ORAL_CAPSULE | Freq: Once | ORAL | Status: AC
  Administered 2018-08-29: 4 mg via ORAL
  Filled 2018-08-29: qty 2

## 2018-08-29 MED ORDER — ROSUVASTATIN CALCIUM 20 MG PO TABS
20.0000 mg | ORAL_TABLET | Freq: Every day | ORAL | Status: DC
  Administered 2018-08-30: 20 mg via ORAL
  Filled 2018-08-29: qty 1

## 2018-08-29 NOTE — Progress Notes (Signed)
Patient Demographics:    [name], is a 85 y.o. female, DOB - October 30, 1933, OZH:[id number]  Admit date - 08/25/2018   Admitting Physician Courage Denton Brick, MD  Outpatient Primary MD for the patient is [name], MD  LOS - 4   Chief Complaint  Patient presents with   expressive aphasia        Subjective:    [name] -She does not have any new complaints -She does not to get short of breath on exertion, but family reports is been present for several months now. -Speech is back to baseline    Assessment  & Plan :    Principal Problem:   CVA (cerebral vascular accident) Acute subcentimeter infarction in the right Corpus Callosum Active Problems:   HLD (hyperlipidemia)   Essential hypertension   Atrial fibrillation (HCC)   Chronic anticoagulation/Eliquis for Afib   Acute CVA (cerebrovascular accident) [facility])  Brief summary 85 y.o. female with past medical history relevant for chronic atrial fibrillation (on Eliquis for anticoagulation), HTN,, HLD, rheumatoid arthritis admitted with expressive aphasia and found to have acute stroke of the right corpus callosum- -awaiting insurance approval for SNF rehab  A/p 1)Acute CVA -right corpus callosum CVA with expressive aphasia that is now resolved-- place on telemetry monitored -patient remains in A. fib -Patient received aspirin, given recurrent episodes of expressive aphasia and left-sided deficits discussed with Dr. Simone Curia the neurologist, he advised continue aspirin 81 mg for the next [redacted] weeks along with Eliquis 2.5 mg twice daily in the setting of A. Fib.  She will need neurology follow-up in the next 6 weeks. --- MRI brain showed acute subcentimeter infarction in the right MRA head and neck without LVO -echocardiogram from 08/23/2018 with preserved EF of 60 to 65% without acute findings.    --Direct LDL 177, - triglycerides are 608,  total cholesterol is 248 and HDL is 33, non-HDL cholesterol is 215, started on Crestor since she did not tolerate Lipitor in the past -A1c 5.3, -TSH is 0.87 -PT  eval appreciated, , SNF rehab recommended  2) chronic atrial fibrillation--continue Eliquis 2.5 mg twice daily for secondary stroke prevention-- --Continue Toprol-XL for rate control 50 mg for rate control, unable to titrate up due to soft BP  3) rheumatoid arthritis ---At baseline she is able to walk short distances with walking/assistive device, otherwise she uses wheelchair due to previous back surgeries and rheumatoid arthritis -Continue Arava  4) social/ethics--- plan of care discussed with patient and daughter [name] , she is a full code  Disposition/Need for in-Hospital Stay- patient unable to be discharged at this time due to recurrent neurological events deficits with recurrent expressive aphasia and recurrent left-sided deficits---  -Awaiting insurance approval for discharge to SNF rehab  Code Status : full  Family Communication:    (patient is alert, awake and coherent) Discussed with daughter   Disposition Plan  : SNF rehab as previously recommended by physical therapy  Consults  : Neurology  DVT Prophylaxis  : Eliquis- SCDs   Lab Results  Component Value Date   PLT 191 08/29/2018    Inpatient Medications  Scheduled Meds:  apixaban  2.5 mg Oral BID   aspirin  81 mg Oral Daily   cholecalciferol  2,000 Units  Oral BID   leflunomide  20 mg Oral Daily   metoprolol succinate  50 mg Oral Daily   pantoprazole  40 mg Oral Daily   [START ON 08/30/2018] rosuvastatin  20 mg Oral q1800   sodium chloride flush  3 mL Intravenous Q12H   Continuous Infusions:  sodium chloride     PRN Meds:.sodium chloride, acetaminophen **OR** acetaminophen, HYDROcodone-acetaminophen, labetalol, ondansetron **OR** ondansetron (ZOFRAN) IV, polyethylene glycol, sodium chloride flush, traZODone    Anti-infectives (From  admission, onward)   None        Objective:   Vitals:   08/29/18 0500 08/29/18 0900 08/29/18 1300 08/29/18 1700  BP: 129/62 (!) 149/87 120/68 134/62  Nguyen: 81 (!) 105 85 83  Resp: 16 17 18 19   Temp: 98.1 F (36.7 C)     TempSrc: Oral     SpO2: 91% 94% 90% 95%  Weight:      Height:        Wt Readings from Last 3 Encounters:  08/25/18 54.4 kg  08/22/18 54.4 kg  04/07/17 54 kg     Intake/Output Summary (Last 24 hours) at 08/29/2018 1840 Last data filed at 08/29/2018 1800 Gross per 24 hour  Intake 603 ml  Output --  Net 603 ml    Physical Exam General exam: Alert, awake, oriented x 3 Respiratory system: Clear to auscultation. Respiratory effort normal. Cardiovascular system: Irregularly irregular. No murmurs, rubs, gallops. Gastrointestinal system: Abdomen is nondistended, soft and nontender. No organomegaly or masses felt. Normal bowel sounds heard. Central nervous system: Alert and oriented. No focal neurological deficits. Extremities: No C/C/E, +pedal pulses Skin: No rashes, lesions or ulcers Psychiatry: Judgement and insight appear normal. Mood & affect appropriate.      Data Review:   Micro Results Recent Results (from the past 240 hour(s))  SARS Coronavirus 2 [facility] order, Performed in [facility] hospital lab) Nasopharyngeal Nasopharyngeal Swab     Status: None   Collection Time: 08/25/18  1:24 PM   Specimen: Nasopharyngeal Swab  Result Value Ref Range Status   SARS Coronavirus 2 NEGATIVE NEGATIVE Final    Comment: (NOTE) If result is NEGATIVE SARS-CoV-2 target nucleic acids are NOT DETECTED. The SARS-CoV-2 RNA is generally detectable in upper and lower  respiratory specimens during the acute phase of infection. The lowest  concentration of SARS-CoV-2 viral copies this assay can detect is 250  copies / mL. A negative result does not preclude SARS-CoV-2 infection  and should not be used as the sole basis for treatment or other  patient management  decisions.  A negative result may occur with  improper specimen collection / handling, submission of specimen other  than nasopharyngeal swab, presence of viral mutation(s) within the  areas targeted by this assay, and inadequate number of viral copies  (<250 copies / mL). A negative result must be combined with clinical  observations, patient history, and epidemiological information. If result is POSITIVE SARS-CoV-2 target nucleic acids are DETECTED. The SARS-CoV-2 RNA is generally detectable in upper and lower  respiratory specimens dur ing the acute phase of infection.  Positive  results are indicative of active infection with SARS-CoV-2.  Clinical  correlation with patient history and other diagnostic information is  necessary to determine patient infection status.  Positive results do  not rule out bacterial infection or co-infection with other viruses. If result is PRESUMPTIVE POSTIVE SARS-CoV-2 nucleic acids MAY BE PRESENT.   A presumptive positive result was obtained on the submitted specimen  and confirmed on  repeat testing.  While 2019 novel coronavirus  (SARS-CoV-2) nucleic acids may be present in the submitted sample  additional confirmatory testing may be necessary for epidemiological  and / or clinical management purposes  to differentiate between  SARS-CoV-2 and other Sarbecovirus currently known to infect humans.  If clinically indicated additional testing with an alternate test  methodology [phone]) is advised. The SARS-CoV-2 RNA is generally  detectable in upper and lower respiratory sp ecimens during the acute  phase of infection. The expected result is Negative. Fact Sheet for Patients:  StrictlyIdeas.no Fact Sheet for Healthcare Providers: BankingDealers.co.za This test is not yet approved or cleared by the [place] FDA and has been authorized for detection and/or diagnosis of SARS-CoV-2 by FDA under an  Emergency Use Authorization (EUA).  This EUA will remain in effect (meaning this test can be used) for the duration of the COVID-19 declaration under Section 564(b)(1) of the Act, 21 U.S.C. section 360bbb-3(b)(1), unless the authorization is terminated or revoked sooner. Performed at [facility], [address], [place], [place] [address]     Radiology Reports Dg Chest 2 View  Result Date: 08/25/2018 CLINICAL DATA:  Shortness of breath. Cough. EXAM: CHEST - 2 VIEW COMPARISON:  Radiograph 08/22/2018 FINDINGS: The cardiomediastinal contours are unchanged with aortic atherosclerosis. Heart is normal in size. Chronic elevation of right hemidiaphragm. Mild chronic interstitial coarsening is unchanged. Pulmonary vasculature is normal. No consolidation, pleural effusion, or pneumothorax. No acute osseous abnormalities are seen. Vertebroplasty in the upper lumbar spine and postsurgical change in the cervical spine, partially included. IMPRESSION: 1. No acute chest finding. 2.  Aortic Atherosclerosis (ICD10-I70.0). Electronically Signed   By: Keith Rake M.D.   On: 08/25/2018 12:39   Dg Chest 2 View  Result Date: 08/22/2018 CLINICAL DATA:  Shortness of breath on exertion EXAM: CHEST - 2 VIEW COMPARISON:  02/24/2017 FINDINGS: Elevation of the right hemidiaphragm is noted. The heart is within normal limits. Aortic calcifications are noted. No focal infiltrate or sizable effusion is seen. Changes of prior vertebral augmentation are noted. Postsurgical changes in the cervical spine are seen as well. IMPRESSION: No acute abnormality noted. Electronically Signed   By: Inez Catalina M.D.   On: 08/22/2018 14:33   Ct Head Wo Contrast  Result Date: 08/25/2018 CLINICAL DATA:  Expressive aphasia EXAM: CT HEAD WITHOUT CONTRAST TECHNIQUE: Contiguous axial images were obtained from the base of the skull through the vertex without intravenous contrast. COMPARISON:  January 02, 2017 FINDINGS: Brain: Mild diffuse  atrophy is stable. There is no intracranial mass, hemorrhage, extra-axial fluid collection, or midline shift. There is patchy periventricular small vessel disease in the centra semiovale bilaterally. No acute appearing infarct is evident on this study. Vascular: There is no appreciable hyperdense vessel. There is calcification in each distal vertebral artery and carotid siphon region. Skull: Bony calvarium appears intact. Sinuses/Orbits: There is mucosal thickening in the left maxillary antrum with small air-fluid level. There is slight mucosal thickening in several ethmoid air cells. There is a 4 x 4 mm benign osteoma in the left ethmoid region. Orbits appear symmetric bilaterally. Other: Mastoid air cells are clear. IMPRESSION: Mild atrophy with patchy periventricular small vessel disease. No acute infarct. No mass or hemorrhage. There are foci of arterial vascular calcification. There are foci of relatively mild paranasal sinus disease. Electronically Signed   By: Lowella Grip III M.D.   On: 08/25/2018 12:21   Mr Angio Head Wo Contrast  Result Date: 08/25/2018 CLINICAL DATA:  Vertigo.  TIA.  Weakness.  Difficulty standing. EXAM: MRI HEAD WITHOUT CONTRAST MRA HEAD WITHOUT CONTRAST MRA NECK WITHOUT and with CONTRAST TECHNIQUE: Multiplanar, multiecho Nguyen sequences of the brain and surrounding structures were obtained without intravenous contrast. Angiographic images of the Circle of Willis were obtained using MRA technique without intravenous contrast. Angiographic images of the neck were obtained using MRA technique without intravenous contrast. Carotid stenosis measurements (when applicable) are obtained utilizing NASCET criteria, using the distal internal carotid diameter as the denominator. COMPARISON:  None. CONTRAST:  6 cc Gadavist FINDINGS: MRI HEAD FINDINGS Brain: Diffusion imaging shows a subcentimeter acute infarction within the right splenium of the corpus callosum. No other acute infarction.  There chronic small-vessel ischemic changes of the pons. There are old small vessel cerebellar infarctions. There are old small vessel infarctions affecting the thalami, basal ganglia and hemispheric white matter. No large vessel territory infarction. No mass lesion, hemorrhage, hydrocephalus or extra-axial collection. Vascular: Major vessels at the base of the brain show flow. Skull and upper cervical spine: Negative Sinuses/Orbits: Mild mucosal inflammatory changes of the left maxillary sinus. Other sinuses clear. Orbits negative. Other: None MRA HEAD FINDINGS Both internal carotid arteries are widely patent into the brain. No siphon stenosis. The anterior and middle cerebral vessels are patent without proximal stenosis, aneurysm or vascular malformation. Both vertebral arteries are widely patent to the basilar. No basilar stenosis. Posterior circulation branch vessels appear normal. MRA NECK FINDINGS Branching pattern of the brachiocephalic vessels from the arch is normal. No origin stenosis. Both common carotid arteries are widely patent their respective bifurcation. There is mild atherosclerotic disease at both carotid bifurcations. 20% stenosis of the proximal ICA on the right. No measurable stenosis on the left. Cervical internal carotid arteries widely patent beyond that. Both vertebral arteries are widely patent at their origins and through the cervical region to the foramen magnum and basilar. IMPRESSION: Background pattern of chronic small vessel ischemic changes throughout the brain. Acute subcentimeter infarction in the right splenium of the corpus callosum which could be symptomatic. No sign of hemorrhage or mass effect. Atherosclerotic change of the carotid bifurcations. 20% stenosis of the proximal ICA on the right. No stenosis on the left. Negative intracranial MR angiography. No large or medium vessel occlusion. Electronically Signed   By: Nelson Chimes M.D.   On: 08/25/2018 15:58   Mr Angio Neck  W Wo Contrast  Result Date: 08/25/2018 CLINICAL DATA:  Vertigo.  TIA.  Weakness.  Difficulty standing. EXAM: MRI HEAD WITHOUT CONTRAST MRA HEAD WITHOUT CONTRAST MRA NECK WITHOUT and with CONTRAST TECHNIQUE: Multiplanar, multiecho Nguyen sequences of the brain and surrounding structures were obtained without intravenous contrast. Angiographic images of the Circle of Willis were obtained using MRA technique without intravenous contrast. Angiographic images of the neck were obtained using MRA technique without intravenous contrast. Carotid stenosis measurements (when applicable) are obtained utilizing NASCET criteria, using the distal internal carotid diameter as the denominator. COMPARISON:  None. CONTRAST:  6 cc Gadavist FINDINGS: MRI HEAD FINDINGS Brain: Diffusion imaging shows a subcentimeter acute infarction within the right splenium of the corpus callosum. No other acute infarction. There chronic small-vessel ischemic changes of the pons. There are old small vessel cerebellar infarctions. There are old small vessel infarctions affecting the thalami, basal ganglia and hemispheric white matter. No large vessel territory infarction. No mass lesion, hemorrhage, hydrocephalus or extra-axial collection. Vascular: Major vessels at the base of the brain show flow. Skull and upper cervical spine: Negative Sinuses/Orbits: Mild mucosal inflammatory changes of  the left maxillary sinus. Other sinuses clear. Orbits negative. Other: None MRA HEAD FINDINGS Both internal carotid arteries are widely patent into the brain. No siphon stenosis. The anterior and middle cerebral vessels are patent without proximal stenosis, aneurysm or vascular malformation. Both vertebral arteries are widely patent to the basilar. No basilar stenosis. Posterior circulation branch vessels appear normal. MRA NECK FINDINGS Branching pattern of the brachiocephalic vessels from the arch is normal. No origin stenosis. Both common carotid arteries are  widely patent their respective bifurcation. There is mild atherosclerotic disease at both carotid bifurcations. 20% stenosis of the proximal ICA on the right. No measurable stenosis on the left. Cervical internal carotid arteries widely patent beyond that. Both vertebral arteries are widely patent at their origins and through the cervical region to the foramen magnum and basilar. IMPRESSION: Background pattern of chronic small vessel ischemic changes throughout the brain. Acute subcentimeter infarction in the right splenium of the corpus callosum which could be symptomatic. No sign of hemorrhage or mass effect. Atherosclerotic change of the carotid bifurcations. 20% stenosis of the proximal ICA on the right. No stenosis on the left. Negative intracranial MR angiography. No large or medium vessel occlusion. Electronically Signed   By: Nelson Chimes M.D.   On: 08/25/2018 15:58   Mr Brain Wo Contrast  Result Date: 08/25/2018 CLINICAL DATA:  Vertigo.  TIA.  Weakness.  Difficulty standing. EXAM: MRI HEAD WITHOUT CONTRAST MRA HEAD WITHOUT CONTRAST MRA NECK WITHOUT and with CONTRAST TECHNIQUE: Multiplanar, multiecho Nguyen sequences of the brain and surrounding structures were obtained without intravenous contrast. Angiographic images of the Circle of Willis were obtained using MRA technique without intravenous contrast. Angiographic images of the neck were obtained using MRA technique without intravenous contrast. Carotid stenosis measurements (when applicable) are obtained utilizing NASCET criteria, using the distal internal carotid diameter as the denominator. COMPARISON:  None. CONTRAST:  6 cc Gadavist FINDINGS: MRI HEAD FINDINGS Brain: Diffusion imaging shows a subcentimeter acute infarction within the right splenium of the corpus callosum. No other acute infarction. There chronic small-vessel ischemic changes of the pons. There are old small vessel cerebellar infarctions. There are old small vessel infarctions  affecting the thalami, basal ganglia and hemispheric white matter. No large vessel territory infarction. No mass lesion, hemorrhage, hydrocephalus or extra-axial collection. Vascular: Major vessels at the base of the brain show flow. Skull and upper cervical spine: Negative Sinuses/Orbits: Mild mucosal inflammatory changes of the left maxillary sinus. Other sinuses clear. Orbits negative. Other: None MRA HEAD FINDINGS Both internal carotid arteries are widely patent into the brain. No siphon stenosis. The anterior and middle cerebral vessels are patent without proximal stenosis, aneurysm or vascular malformation. Both vertebral arteries are widely patent to the basilar. No basilar stenosis. Posterior circulation branch vessels appear normal. MRA NECK FINDINGS Branching pattern of the brachiocephalic vessels from the arch is normal. No origin stenosis. Both common carotid arteries are widely patent their respective bifurcation. There is mild atherosclerotic disease at both carotid bifurcations. 20% stenosis of the proximal ICA on the right. No measurable stenosis on the left. Cervical internal carotid arteries widely patent beyond that. Both vertebral arteries are widely patent at their origins and through the cervical region to the foramen magnum and basilar. IMPRESSION: Background pattern of chronic small vessel ischemic changes throughout the brain. Acute subcentimeter infarction in the right splenium of the corpus callosum which could be symptomatic. No sign of hemorrhage or mass effect. Atherosclerotic change of the carotid bifurcations. 20% stenosis of the proximal  ICA on the right. No stenosis on the left. Negative intracranial MR angiography. No large or medium vessel occlusion. Electronically Signed   By: Nelson Chimes M.D.   On: 08/25/2018 15:58    CBC Recent Labs  Lab 08/25/18 1237 08/26/18 0545 08/29/18 0452  WBC 5.5 5.9 6.1  HGB 12.7 12.9 11.6*  HCT 40.4 41.9 37.3  PLT 195 222 191  MCV 88.0  88.2 88.6  MCH 27.7 27.2 27.6  MCHC 31.4 30.8 31.1  RDW 15.1 15.1 14.9    Chemistries  Recent Labs  Lab 08/25/18 1237 08/26/18 0545  NA 140 140  K 4.0 3.5  CL 104 103  CO2 27 25  GLUCOSE 101* 123*  BUN 11 13  CREATININE 0.63 0.81  CALCIUM 8.6* 8.8*  AST 22  --   ALT 13  --   ALKPHOS 60  --   BILITOT 0.6  --    ------------------------------------------------------------------------------------------------------------------ No results for input(s): CHOL, HDL, LDLCALC, TRIG, CHOLHDL, LDLDIRECT in the last 72 hours.  Lab Results  Component Value Date   HGBA1C 5.3 08/25/2018   ------------------------------------------------------------------------------------------------------------------ No results for input(s): TSH, T4TOTAL, T3FREE, THYROIDAB in the last 72 hours.  Invalid input(s): FREET3 ------------------------------------------------------------------------------------------------------------------ No results for input(s): VITAMINB12, FOLATE, FERRITIN, TIBC, IRON, RETICCTPCT in the last 72 hours.  Coagulation profile No results for input(s): INR, PROTIME in the last 168 hours.  No results for input(s): DDIMER in the last 72 hours.  Cardiac Enzymes No results for input(s): CKMB, TROPONINI, MYOGLOBIN in the last 168 hours.  Invalid input(s): CK ------------------------------------------------------------------------------------------------------------------    Component Value Date/Time   BNP 298.0 (H) 08/22/2018 1013     Kathie Dike M.D on 08/29/2018 at 6:40 PM  Go to [email or web address] - for contact info  [facility] - Office  [phone]

## 2018-08-29 NOTE — Progress Notes (Signed)
EEG completed, results pending. 

## 2018-08-29 NOTE — TOC Progression Note (Addendum)
Transition of Care [facility]) - Progression Note    Patient Details  Name: NITYA CAUTHON MRN: [id number] Date of Birth: 05/07/1933  [facility] of [facility] CM/SW Contact  Yessica Putnam, Chauncey Reading, RN Phone Number: 08/29/2018, 11:19 AM  Clinical Narrative:   Over weekend patients mobility improved and  PT recommendation changed to supervision/home health PT, however this morning patient worked with PT and was very fatigued and recommendations were changed back to SNF/supervision.  Received call from patient's son, [name] [phone]), he reports family and patient still feel SNF is needed. He asks that we reach out to [facility] since patient's husband is there. Placed call to [facility], facility is on lock down and not taking patients, however they will talk it over and give me a call with final decision if they can make an exception in this case.    New PT notes faxed out to other referred facilities as well ([place] [facility] and [facility]).   ADDENDUM 1430: Left messages with [facility] and [facility] asking for return call on possible bed offers. [facility] is unable to offer bed at this time as they are on lock down and not able to accept admissions.     Expected Discharge Plan: [place] Barriers to Discharge: No Barriers Identified  Expected Discharge Plan and Services Expected Discharge Plan: [place] arrangements for the past 2 months: [place]

## 2018-08-29 NOTE — Progress Notes (Signed)
Occupational Therapy Treatment Patient Details Name: [name] MRN: [id number] DOB: 03-Jul-1933 Today's Date: 08/29/2018    History of present illness [name]  is a 85 y.o. female with past medical history relevant for chronic atrial fibrillation, HTN,, HLD, rheumatoid arthritis presented by EMS after episode of speech disturbance and altered mentation lasting 15 to 20 minutes. 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    OT comments  Pt in bathroom on OT arrival, agreeable to perform ADLs this am and sit up in chair for breakfast. Pt with significant improvement in functional task completion today. Pt did appear winded and fatigued after toileting task, therefore took seated rest breaks and performed PLB prior to grooming tasks. Pt fatigues easily during tasks, one minor LOB upon turning from sink to go back to chair. Pt reports she would like to go home but believes the doctor and her family want her to go to rehab first. Discharge recommendation remains appropriate, however if pt is discharged home will need [facility] services.    Follow Up Recommendations  SNF    Equipment Recommendations  None recommended by OT       Precautions / Restrictions Precautions Precautions: Fall Restrictions Weight Bearing Restrictions: No       Mobility Bed Mobility               General bed mobility comments: Pt in bathroom upon OT arrival  Transfers Overall transfer level: Needs assistance Equipment used: Rolling walker (2 wheeled) Transfers: Sit to/from [employer] Transfers Sit to Stand: Supervision Stand pivot transfers: Supervision                ADL either performed or assessed with clinical judgement   ADL Overall ADL's : Needs assistance/impaired     Grooming: Wash/dry hands;Oral care;Supervision/safety;Standing;Brushing hair;Sitting Grooming Details (indicate cue type and reason): Pt performing grooming tasks  standing at sink, leaning over onto sink. Pt then sat down in recliner for hair brushing.                  Toilet Transfer: Supervision/safety;Ambulation;RW Toilet Transfer Details (indicate cue type and reason): Pt performing toilet transfers with supervision and RW Toileting- Clothing Manipulation and Hygiene: Supervision/safety;Sitting/lateral lean       Functional mobility during ADLs: Supervision/safety;Min guard;Rolling walker General ADL Comments: Pt requiring min guard after grooming tasks due to one LOB upon turning to go back to recliner               Cognition Arousal/Alertness: Awake/alert Behavior During Therapy: WFL for tasks assessed/performed Overall Cognitive Status: Within Functional Limits for tasks assessed                                                     Pertinent Vitals/ Pain       Pain Assessment: No/denies pain         Frequency  Min 2X/week        Progress Toward Goals  OT Goals(current goals can now be found in the care plan section)  Progress towards OT goals: Progressing toward goals  Acute Rehab OT Goals Patient Stated Goal: To get better and return home OT Goal Formulation: With patient Time For Goal Achievement: 09/09/18 Potential to Achieve Goals: Good ADL Goals Pt Will Perform Eating: with modified independence;sitting  Pt Will Perform Grooming: with modified independence;sitting Pt Will Perform Upper Body Dressing: with supervision;sitting Pt Will Transfer to Toilet: with min guard assist;stand pivot transfer;bedside commode Pt Will Perform Toileting - Clothing Manipulation and hygiene: with supervision;sitting/lateral leans Pt/caregiver will Perform Home Exercise Program: Increased strength;Both right and left upper extremity;Independently;With written HEP provided  Plan Discharge plan remains appropriate          End of Session Equipment Utilized During Treatment: Rolling walker  OT Visit  Diagnosis: Muscle weakness (generalized) (M62.81)   Activity Tolerance Patient tolerated treatment well   Patient Left in chair;with call bell/phone within reach;with chair alarm set   Nurse Communication          Time: [id number] OT Time Calculation (min): 19 min  Charges: OT General Charges $OT Visit: 1 Visit OT Treatments $Self Care/Home Management : 8-22 mins    Guadelupe Sabin, OTR/L  [phone] 08/29/2018, 8:25 AM

## 2018-08-29 NOTE — Procedures (Signed)
  [place] A. Merlene Laughter, MD     [email or web address]           HISTORY: The patient 85 year old presents with recurrent episodes dysarthria and confusion worrisome for possible complex partial seizures.  MEDICATIONS:  Current Facility-Administered Medications:  .  0.9 %  sodium chloride infusion, 250 mL, Intravenous, PRN, Emokpae, Courage, MD .  acetaminophen (TYLENOL) tablet 650 mg, 650 mg, Oral, Q6H PRN, 650 mg at 08/25/18 2111 **OR** acetaminophen (TYLENOL) suppository 650 mg, 650 mg, Rectal, Q6H PRN, Emokpae, Courage, MD .  apixaban (ELIQUIS) tablet 2.5 mg, 2.5 mg, Oral, BID, Emokpae, Courage, MD, 2.5 mg at 08/29/18 0938 .  aspirin chewable tablet 81 mg, 81 mg, Oral, Daily, Emokpae, Courage, MD, 81 mg at 08/29/18 0938 .  cholecalciferol (VITAMIN D3) tablet 2,000 Units, 2,000 Units, Oral, BID, [place] Brick, Courage, MD, 2,000 Units at 08/29/18 0938 .  HYDROcodone-acetaminophen (NORCO/VICODIN) 5-325 MG per tablet 1 tablet, 1 tablet, Oral, Q6H PRN, [place] Brick, Courage, MD, 1 tablet at 08/29/18 1757 .  labetalol (NORMODYNE) injection 10 mg, 10 mg, Intravenous, Q4H PRN, Emokpae, Courage, MD .  leflunomide (ARAVA) tablet 20 mg, 20 mg, Oral, Daily, Emokpae, Courage, MD, 20 mg at 08/29/18 0953 .  metoprolol succinate (TOPROL-XL) 24 hr tablet 50 mg, 50 mg, Oral, Daily, Emokpae, Courage, MD, 50 mg at 08/29/18 0938 .  ondansetron (ZOFRAN) tablet 4 mg, 4 mg, Oral, Q6H PRN **OR** ondansetron (ZOFRAN) injection 4 mg, 4 mg, Intravenous, Q6H PRN, Emokpae, Courage, MD .  pantoprazole (PROTONIX) EC tablet 40 mg, 40 mg, Oral, Daily, Emokpae, Courage, MD, 40 mg at 08/29/18 0938 .  polyethylene glycol (MIRALAX / GLYCOLAX) packet 17 g, 17 g, Oral, Daily PRN, Denton Brick, Courage, MD .  Derrill Memo ON 08/30/2018] rosuvastatin (CRESTOR) tablet 20 mg, 20 mg, Oral, q1800, Memon, Jehanzeb, MD .  sodium chloride flush (NS) 0.9 % injection 3 mL, 3 mL, Intravenous, Q12H, Emokpae, Courage, MD, 3 mL at 08/29/18 0938  .  sodium chloride flush (NS) 0.9 % injection 3 mL, 3 mL, Intravenous, PRN, Emokpae, Courage, MD .  traZODone (DESYREL) tablet 50 mg, 50 mg, Oral, QHS PRN, Emokpae, Courage, MD, 50 mg at 08/28/18 2205     ANALYSIS: A 16 channel recording using standard 10 20 measurements is conducted for 24 minutes.   There is a well-formed posterior dominant rhythm of 11 hertz which attenuates with eye-opening.  There is beta activity observed in frontal areas.  Awake and drowsy architecture are observed.  Photic stimulation and hyperventilation were not carried out.  There is normal myogenic artifact noted.  There is no focal or lateralized slowing.  There is no epileptiform activity is observed.    IMPRESSION: 1. This is a normal recording of awake and drowsy states.      Guenther [place] A. Merlene Laughter, M.D.  Diplomate, Tax adviser of Psychiatry and Neurology ( Neurology).

## 2018-08-29 NOTE — Progress Notes (Signed)
Physical Therapy Treatment Patient Details Name: [name] MRN: [id number] DOB: 07/25/1933 Today's Date: 08/29/2018    History of Present Illness [name]  is a 85 y.o. female with past medical history relevant for chronic atrial fibrillation, HTN,, HLD, rheumatoid arthritis presented by EMS after episode of speech disturbance and altered mentation lasting 15 to 20 minutes. 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     PT Comments    Patient initially demonstrated improved bed mobility and transfer mobility compared to previous sessions but reported some "dizziness." Dizziness improved after sitting up in recliner and attempted walking with RW after seated exercises. Ambulated 8 feet with min guard assist and then patient unable to hold herself up, PT able to stabilize patient in standing position with moderate assist. Patient reported extreme dizziness and fatigue, with moderate assist patient ambulated back to chair but reported continued severe dizziness. Vitals assessed BP 166/93, O2 sat between 93-98% and HR 105. Encouraged patient to transition back to bed but patient wanted to stay in chair. Nursing notified of patient position, decline in functional status since weekend (per PT notes) and current vitals. Patient would continue to benefit from skilled physical therapy while in hospital to improve functional endurance and mobility. At this time, patient would benefit from working on improving functional strength and ambulation at skilled nursing facility to decrease risk of falling with return home.     Follow Up Recommendations  Supervision/Assistance - 24 hour;SNF     Equipment Recommendations  None recommended by PT    Recommendations for Other Services       Precautions / Restrictions Precautions Precautions: Fall Restrictions Weight Bearing Restrictions: No    Mobility  Bed Mobility Overal bed mobility: Modified  Independent Bed Mobility: Rolling;Sidelying to Sit Rolling: Modified independent (Device/Increase time) Sidelying to sit: Modified independent (Device/Increase time) Supine to sit: Modified independent (Device/Increase time)     General bed mobility comments: Pt in bathroom upon OT arrival  Transfers Overall transfer level: Needs assistance Equipment used: Rolling walker (2 wheeled) Transfers: Sit to/from Stand Sit to Stand: Supervision Stand pivot transfers: Supervision       General transfer comment: patient initially able to perform all transfers with supervision. after ambulation needed min assist  Ambulation/Gait Ambulation/Gait assistance: Supervision;Mod assist;Max assist Gait Distance (Feet): 10 Feet Assistive device: Rolling walker (2 wheeled) Gait Pattern/deviations: Decreased step length - right;Decreased step length - left;Decreased stride length Gait velocity: decreased   General Gait Details: initially supervision needed wtih slow gait, patient suddenly fatigued, mod to max assist to prevent LOB and mod assist to return to chair   Stairs             Wheelchair Mobility    Modified Rankin (Stroke Patients Only)       Balance                                            Cognition Arousal/Alertness: Awake/alert Behavior During Therapy: WFL for tasks assessed/performed Overall Cognitive Status: Within Functional Limits for tasks assessed                                        Exercises General Exercises - Lower Extremity Long Arc Quad: AROM;Strengthening;Both;10 reps;Seated Hip Flexion/Marching: AROM;Strengthening;Both;10  reps;Seated Toe Raises: AROM;Strengthening;Seated;Both;10 reps Heel Raises: AROM;Strengthening;Seated;Both;10 reps Other Exercises Other Exercises: sit to stands from recliner 2x5 no UE support    General Comments        Pertinent Vitals/Pain Pain Assessment: Faces Faces Pain Scale:  Hurts even more Pain Location: in neck and shoulder Bilateral Pain Descriptors / Indicators: Headache;Sharp Pain Intervention(s): Limited activity within patient's tolerance;Monitored during session    Home Living                      Prior Function            PT Goals (current goals can now be found in the care plan section) Acute Rehab PT Goals Patient Stated Goal: To get better and return home Time For Goal Achievement: 09/09/18 Potential to Achieve Goals: Good Progress towards PT goals: Progressing toward goals    Frequency    7X/week      PT Plan Current plan remains appropriate    Co-evaluation              AM-PAC PT "6 Clicks" Mobility   Outcome Measure  Help needed turning from your back to your side while in a flat bed without using bedrails?: None Help needed moving from lying on your back to sitting on the side of a flat bed without using bedrails?: None Help needed moving to and from a bed to a chair (including a wheelchair)?: A Little Help needed standing up from a chair using your arms (e.g., wheelchair or bedside chair)?: None Help needed to walk in hospital room?: A Lot Help needed climbing 3-5 steps with a railing? : A Lot 6 Click Score: 19    End of Session   Activity Tolerance: Patient limited by fatigue Patient left: in chair;with chair alarm set;with call bell/phone within reach;Other (comment)(nursing immediately notified) Nurse Communication: Mobility status;Other (comment)(of LOB, vitals, and pain) PT Visit Diagnosis: Unsteadiness on feet (R26.81);Other abnormalities of gait and mobility (R26.89);Muscle weakness (generalized) (M62.81);History of falling (Z91.81)     Time: 5003-7048 PT Time Calculation (min) (ACUTE ONLY): 27 min  Charges:  $Therapeutic Exercise: 8-22 mins $Therapeutic Activity: 8-22 mins                     Jerene Pitch, DPT Physical Therapy with [facility]  [phone]  office 08/29/2018, 10:35 AM

## 2018-08-30 LAB — BASIC METABOLIC PANEL
Anion gap: 10 (ref 5–15)
BUN: 14 mg/dL (ref 8–23)
CO2: 25 mmol/L (ref 22–32)
Calcium: 8.8 mg/dL — ABNORMAL LOW (ref 8.9–10.3)
Chloride: 104 mmol/L (ref 98–111)
Creatinine, Ser: 0.64 mg/dL (ref 0.44–1.00)
GFR calc Af Amer: >60 mL/min (ref >60)
GFR calc non Af Amer: >60 mL/min (ref >60)
Glucose, Bld: 109 mg/dL — ABNORMAL HIGH (ref 70–99)
Potassium: 3.9 mmol/L (ref 3.5–5.1)
Sodium: 139 mmol/L (ref 135–145)

## 2018-08-30 LAB — MAGNESIUM: Magnesium: 1.6 mg/dL — ABNORMAL LOW (ref 1.7–2.4)

## 2018-08-30 MED ORDER — MECLIZINE HCL 12.5 MG PO TABS
12.5000 mg | ORAL_TABLET | Freq: Two times a day (BID) | ORAL | Status: DC
  Administered 2018-08-30 – 2018-08-31 (×2): 12.5 mg via ORAL
  Filled 2018-08-30 (×2): qty 1

## 2018-08-30 MED ORDER — METOPROLOL SUCCINATE ER 50 MG PO TB24
75.0000 mg | ORAL_TABLET | Freq: Every day | ORAL | Status: DC
  Administered 2018-08-30 – 2018-08-31 (×2): 75 mg via ORAL
  Filled 2018-08-30 (×2): qty 1

## 2018-08-30 NOTE — TOC Progression Note (Signed)
Transition of Care [facility]) - Progression Note    Patient Details  Name: MERIDA ALCANTAR MRN: [id number] Date of Birth: 19-Nov-1933  Transition of [facility]) CM/SW Contact  Ihor Gully, LCSW Phone Number: 08/30/2018, 1:53 PM  Clinical Narrative:    Dtr., Mrs. Lawerence, accepts bed offer. Advised facility started insurance authorization today.    Expected Discharge Plan: [place] Barriers to Discharge: No Barriers Identified  Expected Discharge Plan and Services Expected Discharge Plan: [place] arrangements for the past 2 months: Single Family Home                                       Social Determinants of Health (SDOH) Interventions    Readmission Risk Interventions No flowsheet data found.

## 2018-08-30 NOTE — Progress Notes (Signed)
OT Cancellation Note  Patient Details Name: [name] MRN: [id number] DOB: [birth year]   Cancelled Treatment:    Reason Eval/Treat Not Completed: Other (comment). Pt awake and alert this am, decline to participate in OT session this am as she is feeling lightheaded. Requested to wait until after breakfast. OT will check back as schedule allows.   Guadelupe Sabin, OTR/L  [phone] 08/30/2018, 7:41 AM

## 2018-08-30 NOTE — Progress Notes (Signed)
Physical Therapy Treatment Patient Details Name: [name] MRN: [id number] DOB: 1933/07/01 Today's Date: 08/30/2018    History of Present Illness [name]  is a 85 y.o. female with past medical history relevant for chronic atrial fibrillation, HTN,, HLD, rheumatoid arthritis presented by EMS after episode of speech disturbance and altered mentation lasting 15 to 20 minutes. 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     PT Comments    Patient required increased assistance with bed mobilities today secondary to dizziness and an inability to maintain upright posture independently during transitional mobility due to dizziness. Monitored vitals throughout session. HR ranged from 100-150 at rest sitting edge of bed and supine in bed. Orthostatics measured, see vital section for details, overall BP decreased with standing from sitting position but patient reported minimal change in dizziness with standing. Improved symptoms of dizziness noted after treatment session. Patient left in chair with call bell and chair alarm on. Patient would continue to benefit from skilled physical therapy while in hospital to improved transitional and ambulatory mobility.   Follow Up Recommendations  Supervision/Assistance - 24 hour;SNF     Equipment Recommendations  None recommended by PT    Recommendations for Other Services       Precautions / Restrictions Precautions Precautions: Fall Restrictions Weight Bearing Restrictions: No    Mobility  Bed Mobility Overal bed mobility: Modified Independent Bed Mobility: Rolling;Sidelying to Sit Rolling: Supervision Sidelying to sit: Min assist Supine to sit: Supervision     General bed mobility comments: dizziness kept patient from performing independently, falls back on bed as soon as she tries to transition to sitting, able to perform with min assist  Transfers Overall transfer level: Needs  assistance Equipment used: Rolling walker (2 wheeled) Transfers: Sit to/from Stand Sit to Stand: Min guard         General transfer comment: patient initially able to perform all transfers with supervision. after ambulation needed min assist  Ambulation/Gait Ambulation/Gait assistance: Min guard Gait Distance (Feet): 8 Feet Assistive device: Rolling walker (2 wheeled) Gait Pattern/deviations: Decreased step length - right;Decreased step length - left;Decreased stride length;Trunk flexed Gait velocity: decreased   General Gait Details: small steps taken, no LOB or episode of severe dizziness today   Stairs             Wheelchair Mobility    Modified Rankin (Stroke Patients Only)       Balance Overall balance assessment: Needs assistance Sitting-balance support: Feet supported;No upper extremity supported Sitting balance-Leahy Scale: Good Sitting balance - Comments: seated at bedside   Standing balance support: Bilateral upper extremity supported;During functional activity Standing balance-Leahy Scale: Fair Standing balance comment: using RW                            Cognition Arousal/Alertness: Awake/alert Behavior During Therapy: WFL for tasks assessed/performed Overall Cognitive Status: Within Functional Limits for tasks assessed                                        Exercises General Exercises - Lower Extremity Long Arc Quad: AROM;Strengthening;Both;10 reps;Seated Heel Slides: AROM;Strengthening;Supine;Both;10 reps Straight Leg Raises: AROM;Strengthening;Supine;Both;10 reps Toe Raises: AROM;Strengthening;Seated;Both;10 reps Heel Raises: AROM;Strengthening;Seated;Both;10 reps    General Comments        Pertinent Vitals/Pain Pain Assessment: No/denies pain    Home  Living Family/patient expects to be discharged to:: Private residence Living Arrangements: Alone Available Help at Discharge: Family;Available  PRN/intermittently Type of Home: House Home Access: Stairs to enter Entrance Stairs-Rails: Right Home Layout: One level Home Equipment: Walker - 2 wheels;Cane - single point;Shower seat;Wheelchair - manual      Prior Function Level of Independence: Needs assistance  Gait / Transfers Assistance Needed: short distanced household ambulation pulling wheelchair behind her, otherwise leans on nearby objects for support ADL's / Homemaking Assistance Needed: assisted by family PRN, but not available 24/7     PT Goals (current goals can now be found in the care plan section) Acute Rehab PT Goals Patient Stated Goal: To get better and return home PT Goal Formulation: With patient Time For Goal Achievement: 09/09/18 Potential to Achieve Goals: Good Progress towards PT goals: Progressing toward goals    Frequency    7X/week      PT Plan Current plan remains appropriate    Co-evaluation              AM-PAC PT "6 Clicks" Mobility   Outcome Measure  Help needed turning from your back to your side while in a flat bed without using bedrails?: A Little Help needed moving from lying on your back to sitting on the side of a flat bed without using bedrails?: A Little Help needed moving to and from a bed to a chair (including a wheelchair)?: A Little Help needed standing up from a chair using your arms (e.g., wheelchair or bedside chair)?: None Help needed to walk in hospital room?: A Little Help needed climbing 3-5 steps with a railing? : A Lot 6 Click Score: 18    End of Session   Activity Tolerance: Patient limited by fatigue Patient left: in chair;with chair alarm set;with call bell/phone within reach;Other (comment)(nursing immediately notified) Nurse Communication: Mobility status;Other (comment)(vitals) PT Visit Diagnosis: Unsteadiness on feet (R26.81);Other abnormalities of gait and mobility (R26.89);Muscle weakness (generalized) (M62.81);History of falling (Z91.81)      Time: 8270-7867 PT Time Calculation (min) (ACUTE ONLY): 20 min  Charges:  $Therapeutic Exercise: 8-22 mins                      10:39 AM, 08/30/18 Jerene Pitch, DPT Physical Therapy with [facility]  [phone] office

## 2018-08-30 NOTE — Progress Notes (Signed)
Patient Demographics:    [name], is a 85 y.o. female, DOB - May 24, 1933, JXB:[id number]  Admit date - 08/25/2018   Admitting Physician Courage Denton Brick, MD  Outpatient Primary MD for the patient is [name], MD  LOS - 5   Chief Complaint  Patient presents with   expressive aphasia        Subjective:    [name] reports cerumen examination feeling lightheaded and dizzy intermittently.  Denies chest pain, no shortness of breath, no nausea, no vomiting.  Speech is back to baseline.  Patient is alert, awake and oriented x3.    Assessment  & Plan :    Principal Problem:   CVA (cerebral vascular accident) Acute subcentimeter infarction in the right Corpus Callosum Active Problems:   HLD (hyperlipidemia)   Essential hypertension   Atrial fibrillation (HCC)   Chronic anticoagulation/Eliquis for Afib   Acute CVA (cerebrovascular accident) [facility])  Brief summary 85 y.o. female with past medical history relevant for chronic atrial fibrillation (on Eliquis for anticoagulation), HTN,, HLD, rheumatoid arthritis admitted with expressive aphasia and found to have acute stroke of the right corpus callosum- -awaiting insurance approval for SNF rehab  A/p 1)Acute CVA -right corpus callosum CVA with expressive aphasia that is now resolved-- place on telemetry monitored -patient remains in A. fib -Patient received aspirin, given recurrent episodes of expressive aphasia and left-sided deficits discussed with Dr. Simone Curia the neurologist, he advised continue aspirin 81 mg for the next [redacted] weeks along with Eliquis 2.5 mg twice daily in the setting of A. Fib.  She will need neurology follow-up in the next 6 weeks. --- MRI brain showed acute subcentimeter infarction in the right MRA head and neck without LVO -echocardiogram from 08/23/2018 with preserved EF of 60 to 65% without acute findings.    --Direct  LDL 177, - triglycerides are 608, total cholesterol is 248 and HDL is 33, non-HDL cholesterol is 215, started on Crestor since she did not tolerate Lipitor in the past. -Repeat c-Met and lipid profile in 8 weeks. -A1c 5.3, -TSH is 0.87 -PT  eval appreciated, SNF rehab recommended -Given ongoing symptoms of lightheadedness and dizziness patient will be started on meclizine 12.5 mg twice a day.  2) chronic atrial fibrillation- -continue Eliquis 2.5 mg twice daily for secondary stroke prevention -Continue Toprol-XL for rate control; dose has been adjusted to 75 mg to better control patient's rate.    3) rheumatoid arthritis ---At baseline she is able to walk short distances with walking/assistive device, otherwise she uses wheelchair due to previous back surgeries and rheumatoid arthritis -Continue Arava  4) social/ethics--- plan of care discussed with patient and daughter [name] at bedside, she is a full code. -Patient declines long-term placement in a facility, but is in agreement with short-term rehabilitation to acquire better conditioning prior to go home.  Disposition/Need for in-Hospital Stay- patient unable to be discharged at this time due to unsafe environment given patient's physical limitations and decreased capacity to perform activities of daily living while living by herself as prior to admission.  Patient without findings suggesting seizure activity; will benefit of a skilled nursing facility rehabilitation to acquire conditioning and better capability of caring for herself.  Discussed with family at bedside the need of  near the future around-the-clock care and assistance.  Code Status : full Code  Family Communication: Discussed with daughter at bedside  Disposition Plan  : SNF rehab as previously recommended by physical therapy.  Consults  : Neurology  DVT Prophylaxis  : Eliquis  Lab Results  Component Value Date   PLT 191 08/29/2018    Inpatient  Medications  Scheduled Meds:  apixaban  2.5 mg Oral BID   aspirin  81 mg Oral Daily   cholecalciferol  2,000 Units Oral BID   leflunomide  20 mg Oral Daily   meclizine  12.5 mg Oral BID   metoprolol succinate  75 mg Oral Daily   pantoprazole  40 mg Oral Daily   rosuvastatin  20 mg Oral q1800   sodium chloride flush  3 mL Intravenous Q12H   Continuous Infusions:  sodium chloride     PRN Meds:.sodium chloride, acetaminophen **OR** acetaminophen, HYDROcodone-acetaminophen, labetalol, ondansetron **OR** ondansetron (ZOFRAN) IV, polyethylene glycol, sodium chloride flush, traZODone    Anti-infectives (From admission, onward)   None        Objective:   Vitals:   08/30/18 0800 08/30/18 0900 08/30/18 1029 08/30/18 1300  BP: 138/89 97/64 106/71 (!) 102/55  Pulse: 99 91  87  Resp: _0 Temp:  98.5 F (36.9 C)  97.9 F (36.6 C)  TempSrc:  Oral  Oral  SpO2: 93% 94% 94% 92%  Weight:      Height:        Wt Readings from Last 3 Encounters:  08/25/18 54.4 kg  08/22/18 54.4 kg  04/07/17 54 kg     Intake/Output Summary (Last 24 hours) at 08/30/2018 1709 Last data filed at 08/30/2018 1200 Gross per 24 hour  Intake 848 ml  Output 700 ml  Net 148 ml    Physical Exam General exam: Alert, awake, oriented x 3; reports feeling dizzy, weak and deconditioned.  No chest pain, no nausea, no vomiting, no shortness of breath.  Patient is afebrile. Respiratory system: Clear to auscultation. Respiratory effort normal. Cardiovascular system:irregular. No murmurs, rubs, gallops. Gastrointestinal system: Abdomen is nondistended, soft and nontender. No organomegaly or masses felt. Normal bowel sounds heard. Central nervous system: Alert and oriented.  No dysarthria, no pronator drift.  Intact cranial nerves.  Muscle Strength 4 out of 5 bilaterally (L > R).  Normal muscle tone. Extremities: No C/C/E, +pedal pulses Skin: No rashes, lesions or ulcers Psychiatry: Judgement and  insight appear normal.  Flat affect.     Data Review:   Micro Results Recent Results (from the past 240 hour(s))  SARS Coronavirus 2 [facility] order, Performed in [facility] hospital lab) Nasopharyngeal Nasopharyngeal Swab     Status: None   Collection Time: 08/25/18  1:24 PM   Specimen: Nasopharyngeal Swab  Result Value Ref Range Status   SARS Coronavirus 2 NEGATIVE NEGATIVE Final    Comment: (NOTE) If result is NEGATIVE SARS-CoV-2 target nucleic acids are NOT DETECTED. The SARS-CoV-2 RNA is generally detectable in upper and lower  respiratory specimens during the acute phase of infection. The lowest  concentration of SARS-CoV-2 viral copies this assay can detect is 250  copies / mL. A negative result does not preclude SARS-CoV-2 infection  and should not be used as the sole basis for treatment or other  patient management decisions.  A negative result may occur with  improper specimen collection / handling, submission of specimen other  than nasopharyngeal swab, presence of viral mutation(s) within the  areas targeted by this assay, and inadequate number of viral copies  (<250 copies / mL). A negative result must be combined with clinical  observations, patient history, and epidemiological information. If result is POSITIVE SARS-CoV-2 target nucleic acids are DETECTED. The SARS-CoV-2 RNA is generally detectable in upper and lower  respiratory specimens dur ing the acute phase of infection.  Positive  results are indicative of active infection with SARS-CoV-2.  Clinical  correlation with patient history and other diagnostic information is  necessary to determine patient infection status.  Positive results do  not rule out bacterial infection or co-infection with other viruses. If result is PRESUMPTIVE POSTIVE SARS-CoV-2 nucleic acids MAY BE PRESENT.   A presumptive positive result was obtained on the submitted specimen  and confirmed on repeat testing.  While 2019 novel  coronavirus  (SARS-CoV-2) nucleic acids may be present in the submitted sample  additional confirmatory testing may be necessary for epidemiological  and / or clinical management purposes  to differentiate between  SARS-CoV-2 and other Sarbecovirus currently known to infect humans.  If clinically indicated additional testing with an alternate test  methodology [phone]) is advised. The SARS-CoV-2 RNA is generally  detectable in upper and lower respiratory sp ecimens during the acute  phase of infection. The expected result is Negative. Fact Sheet for Patients:  StrictlyIdeas.no Fact Sheet for Healthcare Providers: BankingDealers.co.za This test is not yet approved or cleared by the [place] FDA and has been authorized for detection and/or diagnosis of SARS-CoV-2 by FDA under an Emergency Use Authorization (EUA).  This EUA will remain in effect (meaning this test can be used) for the duration of the COVID-19 declaration under Section 564(b)(1) of the Act, 21 U.S.C. section 360bbb-3(b)(1), unless the authorization is terminated or revoked sooner. Performed at [facility], [address], [place], [place] [address]     Radiology Reports Dg Chest 2 View  Result Date: 08/25/2018 CLINICAL DATA:  Shortness of breath. Cough. EXAM: CHEST - 2 VIEW COMPARISON:  Radiograph 08/22/2018 FINDINGS: The cardiomediastinal contours are unchanged with aortic atherosclerosis. Heart is normal in size. Chronic elevation of right hemidiaphragm. Mild chronic interstitial coarsening is unchanged. Pulmonary vasculature is normal. No consolidation, pleural effusion, or pneumothorax. No acute osseous abnormalities are seen. Vertebroplasty in the upper lumbar spine and postsurgical change in the cervical spine, partially included. IMPRESSION: 1. No acute chest finding. 2.  Aortic Atherosclerosis (ICD10-I70.0). Electronically Signed   By: Keith Rake M.D.   On:  08/25/2018 12:39   Dg Chest 2 View  Result Date: 08/22/2018 CLINICAL DATA:  Shortness of breath on exertion EXAM: CHEST - 2 VIEW COMPARISON:  02/24/2017 FINDINGS: Elevation of the right hemidiaphragm is noted. The heart is within normal limits. Aortic calcifications are noted. No focal infiltrate or sizable effusion is seen. Changes of prior vertebral augmentation are noted. Postsurgical changes in the cervical spine are seen as well. IMPRESSION: No acute abnormality noted. Electronically Signed   By: Inez Catalina M.D.   On: 08/22/2018 14:33   Ct Head Wo Contrast  Result Date: 08/25/2018 CLINICAL DATA:  Expressive aphasia EXAM: CT HEAD WITHOUT CONTRAST TECHNIQUE: Contiguous axial images were obtained from the base of the skull through the vertex without intravenous contrast. COMPARISON:  January 02, 2017 FINDINGS: Brain: Mild diffuse atrophy is stable. There is no intracranial mass, hemorrhage, extra-axial fluid collection, or midline shift. There is patchy periventricular small vessel disease in the centra semiovale bilaterally. No acute appearing infarct is evident on this study. Vascular: There  is no appreciable hyperdense vessel. There is calcification in each distal vertebral artery and carotid siphon region. Skull: Bony calvarium appears intact. Sinuses/Orbits: There is mucosal thickening in the left maxillary antrum with small air-fluid level. There is slight mucosal thickening in several ethmoid air cells. There is a 4 x 4 mm benign osteoma in the left ethmoid region. Orbits appear symmetric bilaterally. Other: Mastoid air cells are clear. IMPRESSION: Mild atrophy with patchy periventricular small vessel disease. No acute infarct. No mass or hemorrhage. There are foci of arterial vascular calcification. There are foci of relatively mild paranasal sinus disease. Electronically Signed   By: Lowella Grip III M.D.   On: 08/25/2018 12:21   Mr Angio Head Wo Contrast  Result Date:  08/25/2018 CLINICAL DATA:  Vertigo.  TIA.  Weakness.  Difficulty standing. EXAM: MRI HEAD WITHOUT CONTRAST MRA HEAD WITHOUT CONTRAST MRA NECK WITHOUT and with CONTRAST TECHNIQUE: Multiplanar, multiecho pulse sequences of the brain and surrounding structures were obtained without intravenous contrast. Angiographic images of the Circle of Willis were obtained using MRA technique without intravenous contrast. Angiographic images of the neck were obtained using MRA technique without intravenous contrast. Carotid stenosis measurements (when applicable) are obtained utilizing NASCET criteria, using the distal internal carotid diameter as the denominator. COMPARISON:  None. CONTRAST:  6 cc Gadavist FINDINGS: MRI HEAD FINDINGS Brain: Diffusion imaging shows a subcentimeter acute infarction within the right splenium of the corpus callosum. No other acute infarction. There chronic small-vessel ischemic changes of the pons. There are old small vessel cerebellar infarctions. There are old small vessel infarctions affecting the thalami, basal ganglia and hemispheric white matter. No large vessel territory infarction. No mass lesion, hemorrhage, hydrocephalus or extra-axial collection. Vascular: Major vessels at the base of the brain show flow. Skull and upper cervical spine: Negative Sinuses/Orbits: Mild mucosal inflammatory changes of the left maxillary sinus. Other sinuses clear. Orbits negative. Other: None MRA HEAD FINDINGS Both internal carotid arteries are widely patent into the brain. No siphon stenosis. The anterior and middle cerebral vessels are patent without proximal stenosis, aneurysm or vascular malformation. Both vertebral arteries are widely patent to the basilar. No basilar stenosis. Posterior circulation branch vessels appear normal. MRA NECK FINDINGS Branching pattern of the brachiocephalic vessels from the arch is normal. No origin stenosis. Both common carotid arteries are widely patent their respective  bifurcation. There is mild atherosclerotic disease at both carotid bifurcations. 20% stenosis of the proximal ICA on the right. No measurable stenosis on the left. Cervical internal carotid arteries widely patent beyond that. Both vertebral arteries are widely patent at their origins and through the cervical region to the foramen magnum and basilar. IMPRESSION: Background pattern of chronic small vessel ischemic changes throughout the brain. Acute subcentimeter infarction in the right splenium of the corpus callosum which could be symptomatic. No sign of hemorrhage or mass effect. Atherosclerotic change of the carotid bifurcations. 20% stenosis of the proximal ICA on the right. No stenosis on the left. Negative intracranial MR angiography. No large or medium vessel occlusion. Electronically Signed   By: Nelson Chimes M.D.   On: 08/25/2018 15:58   Mr Angio Neck W Wo Contrast  Result Date: 08/25/2018 CLINICAL DATA:  Vertigo.  TIA.  Weakness.  Difficulty standing. EXAM: MRI HEAD WITHOUT CONTRAST MRA HEAD WITHOUT CONTRAST MRA NECK WITHOUT and with CONTRAST TECHNIQUE: Multiplanar, multiecho pulse sequences of the brain and surrounding structures were obtained without intravenous contrast. Angiographic images of the Circle of Willis were obtained using MRA technique  without intravenous contrast. Angiographic images of the neck were obtained using MRA technique without intravenous contrast. Carotid stenosis measurements (when applicable) are obtained utilizing NASCET criteria, using the distal internal carotid diameter as the denominator. COMPARISON:  None. CONTRAST:  6 cc Gadavist FINDINGS: MRI HEAD FINDINGS Brain: Diffusion imaging shows a subcentimeter acute infarction within the right splenium of the corpus callosum. No other acute infarction. There chronic small-vessel ischemic changes of the pons. There are old small vessel cerebellar infarctions. There are old small vessel infarctions affecting the thalami, basal  ganglia and hemispheric white matter. No large vessel territory infarction. No mass lesion, hemorrhage, hydrocephalus or extra-axial collection. Vascular: Major vessels at the base of the brain show flow. Skull and upper cervical spine: Negative Sinuses/Orbits: Mild mucosal inflammatory changes of the left maxillary sinus. Other sinuses clear. Orbits negative. Other: None MRA HEAD FINDINGS Both internal carotid arteries are widely patent into the brain. No siphon stenosis. The anterior and middle cerebral vessels are patent without proximal stenosis, aneurysm or vascular malformation. Both vertebral arteries are widely patent to the basilar. No basilar stenosis. Posterior circulation branch vessels appear normal. MRA NECK FINDINGS Branching pattern of the brachiocephalic vessels from the arch is normal. No origin stenosis. Both common carotid arteries are widely patent their respective bifurcation. There is mild atherosclerotic disease at both carotid bifurcations. 20% stenosis of the proximal ICA on the right. No measurable stenosis on the left. Cervical internal carotid arteries widely patent beyond that. Both vertebral arteries are widely patent at their origins and through the cervical region to the foramen magnum and basilar. IMPRESSION: Background pattern of chronic small vessel ischemic changes throughout the brain. Acute subcentimeter infarction in the right splenium of the corpus callosum which could be symptomatic. No sign of hemorrhage or mass effect. Atherosclerotic change of the carotid bifurcations. 20% stenosis of the proximal ICA on the right. No stenosis on the left. Negative intracranial MR angiography. No large or medium vessel occlusion. Electronically Signed   By: Nelson Chimes M.D.   On: 08/25/2018 15:58   Mr Brain Wo Contrast  Result Date: 08/25/2018 CLINICAL DATA:  Vertigo.  TIA.  Weakness.  Difficulty standing. EXAM: MRI HEAD WITHOUT CONTRAST MRA HEAD WITHOUT CONTRAST MRA NECK WITHOUT and  with CONTRAST TECHNIQUE: Multiplanar, multiecho pulse sequences of the brain and surrounding structures were obtained without intravenous contrast. Angiographic images of the Circle of Willis were obtained using MRA technique without intravenous contrast. Angiographic images of the neck were obtained using MRA technique without intravenous contrast. Carotid stenosis measurements (when applicable) are obtained utilizing NASCET criteria, using the distal internal carotid diameter as the denominator. COMPARISON:  None. CONTRAST:  6 cc Gadavist FINDINGS: MRI HEAD FINDINGS Brain: Diffusion imaging shows a subcentimeter acute infarction within the right splenium of the corpus callosum. No other acute infarction. There chronic small-vessel ischemic changes of the pons. There are old small vessel cerebellar infarctions. There are old small vessel infarctions affecting the thalami, basal ganglia and hemispheric white matter. No large vessel territory infarction. No mass lesion, hemorrhage, hydrocephalus or extra-axial collection. Vascular: Major vessels at the base of the brain show flow. Skull and upper cervical spine: Negative Sinuses/Orbits: Mild mucosal inflammatory changes of the left maxillary sinus. Other sinuses clear. Orbits negative. Other: None MRA HEAD FINDINGS Both internal carotid arteries are widely patent into the brain. No siphon stenosis. The anterior and middle cerebral vessels are patent without proximal stenosis, aneurysm or vascular malformation. Both vertebral arteries are widely patent to the basilar.  No basilar stenosis. Posterior circulation branch vessels appear normal. MRA NECK FINDINGS Branching pattern of the brachiocephalic vessels from the arch is normal. No origin stenosis. Both common carotid arteries are widely patent their respective bifurcation. There is mild atherosclerotic disease at both carotid bifurcations. 20% stenosis of the proximal ICA on the right. No measurable stenosis on the  left. Cervical internal carotid arteries widely patent beyond that. Both vertebral arteries are widely patent at their origins and through the cervical region to the foramen magnum and basilar. IMPRESSION: Background pattern of chronic small vessel ischemic changes throughout the brain. Acute subcentimeter infarction in the right splenium of the corpus callosum which could be symptomatic. No sign of hemorrhage or mass effect. Atherosclerotic change of the carotid bifurcations. 20% stenosis of the proximal ICA on the right. No stenosis on the left. Negative intracranial MR angiography. No large or medium vessel occlusion. Electronically Signed   By: Nelson Chimes M.D.   On: 08/25/2018 15:58    CBC Recent Labs  Lab 08/25/18 1237 08/26/18 0545 08/29/18 0452  WBC 5.5 5.9 6.1  HGB 12.7 12.9 11.6*  HCT 40.4 41.9 37.3  PLT 195 222 191  MCV 88.0 88.2 88.6  MCH 27.7 27.2 27.6  MCHC 31.4 30.8 31.1  RDW 15.1 15.1 14.9    Chemistries  Recent Labs  Lab 08/25/18 1237 08/26/18 0545 08/30/18 0839  NA 140 140 139  K 4.0 3.5 3.9  CL 104 103 104  CO2 _0 GLUCOSE 101* 123* 109*  BUN _1 CREATININE 0.63 0.81 0.64  CALCIUM 8.6* 8.8* 8.8*  MG  --   --  1.6*  AST 22  --   --   ALT 13  --   --   ALKPHOS 60  --   --   BILITOT 0.6  --   --      Lab Results  Component Value Date   HGBA1C 5.3 08/25/2018       Component Value Date/Time   BNP 298.0 (H) 08/22/2018 1013     Barton Dubois M.D  [phone]   08/30/2018 at 5:09 PM  Go to [email or web address] - for contact info  [facility] - Office  [phone]

## 2018-08-31 ENCOUNTER — Inpatient Hospital Stay
Admission: RE | Admit: 2018-08-31 | Discharge: 2018-09-10 | Disposition: A | Discharge status: Home / Self Care | Payer: Medicare Other | Source: Ambulatory Visit | Attending: Internal Medicine | Admitting: Internal Medicine

## 2018-08-31 DIAGNOSIS — K219 Gastro-esophageal reflux disease without esophagitis: Secondary | ICD-10-CM | POA: Diagnosis not present

## 2018-08-31 DIAGNOSIS — M5090 Cervical disc disorder, unspecified, unspecified cervical region: Secondary | ICD-10-CM | POA: Diagnosis not present

## 2018-08-31 DIAGNOSIS — Z7901 Long term (current) use of anticoagulants: Secondary | ICD-10-CM | POA: Diagnosis not present

## 2018-08-31 DIAGNOSIS — G8929 Other chronic pain: Secondary | ICD-10-CM | POA: Diagnosis not present

## 2018-08-31 DIAGNOSIS — I10 Essential (primary) hypertension: Secondary | ICD-10-CM | POA: Diagnosis not present

## 2018-08-31 DIAGNOSIS — M069 Rheumatoid arthritis, unspecified: Secondary | ICD-10-CM

## 2018-08-31 DIAGNOSIS — R279 Unspecified lack of coordination: Secondary | ICD-10-CM | POA: Diagnosis not present

## 2018-08-31 DIAGNOSIS — E782 Mixed hyperlipidemia: Secondary | ICD-10-CM | POA: Diagnosis not present

## 2018-08-31 DIAGNOSIS — M549 Dorsalgia, unspecified: Secondary | ICD-10-CM | POA: Diagnosis not present

## 2018-08-31 DIAGNOSIS — R296 Repeated falls: Secondary | ICD-10-CM | POA: Diagnosis not present

## 2018-08-31 DIAGNOSIS — M542 Cervicalgia: Secondary | ICD-10-CM | POA: Diagnosis not present

## 2018-08-31 DIAGNOSIS — I482 Chronic atrial fibrillation, unspecified: Secondary | ICD-10-CM | POA: Diagnosis not present

## 2018-08-31 DIAGNOSIS — E785 Hyperlipidemia, unspecified: Secondary | ICD-10-CM | POA: Diagnosis not present

## 2018-08-31 DIAGNOSIS — Z993 Dependence on wheelchair: Secondary | ICD-10-CM | POA: Diagnosis not present

## 2018-08-31 DIAGNOSIS — I639 Cerebral infarction, unspecified: Secondary | ICD-10-CM | POA: Diagnosis not present

## 2018-08-31 DIAGNOSIS — R262 Difficulty in walking, not elsewhere classified: Secondary | ICD-10-CM | POA: Diagnosis not present

## 2018-08-31 DIAGNOSIS — R42 Dizziness and giddiness: Secondary | ICD-10-CM | POA: Diagnosis not present

## 2018-08-31 DIAGNOSIS — M6281 Muscle weakness (generalized): Secondary | ICD-10-CM | POA: Diagnosis not present

## 2018-08-31 DIAGNOSIS — I4811 Longstanding persistent atrial fibrillation: Secondary | ICD-10-CM | POA: Diagnosis not present

## 2018-08-31 LAB — CBC
HCT: 37.2 % (ref 36.0–46.0)
Hemoglobin: 12 g/dL (ref 12.0–15.0)
MCH: 28.5 pg (ref 26.0–34.0)
MCHC: 32.3 g/dL (ref 30.0–36.0)
MCV: 88.4 fL (ref 80.0–100.0)
Platelets: 191 10*3/uL (ref 150–400)
RBC: 4.21 MIL/uL (ref 3.87–5.11)
RDW: 14.6 % (ref 11.5–15.5)
WBC: 7 10*3/uL (ref 4.0–10.5)
nRBC: 0 % (ref 0.0–0.2)

## 2018-08-31 MED ORDER — MECLIZINE HCL 12.5 MG PO TABS: 12.5000 mg | ORAL_TABLET | Freq: Two times a day (BID) | ORAL | Status: DC

## 2018-08-31 MED ORDER — ROSUVASTATIN CALCIUM 20 MG PO TABS: 20.0000 mg | ORAL_TABLET | Freq: Every day | ORAL | Status: DC

## 2018-08-31 MED ORDER — METOPROLOL SUCCINATE ER 25 MG PO TB24: 75.0000 mg | ORAL_TABLET | Freq: Every day | ORAL | Status: DC

## 2018-08-31 MED ORDER — ACETAMINOPHEN 325 MG PO TABS: 650.0000 mg | ORAL_TABLET | Freq: Four times a day (QID) | ORAL | Status: DC | PRN

## 2018-08-31 MED ORDER — ASPIRIN 81 MG PO CHEW: 81.0000 mg | CHEWABLE_TABLET | Freq: Every day | ORAL | Status: DC

## 2018-08-31 NOTE — Progress Notes (Signed)
Physical Therapy Treatment Patient Details Name: [name] MRN: [id number] DOB: December 27, 1933 Today's Date: 08/31/2018    History of Present Illness [name]  is a 85 y.o. female with past medical history relevant for chronic atrial fibrillation, HTN,, HLD, rheumatoid arthritis presented by EMS after episode of speech disturbance and altered mentation lasting 15 to 20 minutes. 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     PT Comments    Patient tolerated session well, able to perform seated exercises with only one instance where she collapses backwards into chair due to back pain. Ambulated further today, but cadence decreased and assistance needed increased after about 20 feet of ambulation (required min guard after 20 feet of ambulation due to fatigue). Patient continues to fatigue quickly and continues to have bouts of unsteadiness sitting and standing. Patient will benefit from continued physical therapy in hospital and recommended venue below to increase strength, balance, endurance for safe ADLs and gait.    Follow Up Recommendations  Supervision/Assistance - 24 hour;SNF     Equipment Recommendations  None recommended by PT    Recommendations for Other Services       Precautions / Restrictions Precautions Precautions: Fall Restrictions Weight Bearing Restrictions: No    Mobility  Bed Mobility Overal bed mobility: Modified Independent Bed Mobility: Supine to Sit              Transfers Overall transfer level: Modified independent Equipment used: Rolling walker (2 wheeled) Transfers: Sit to/from Stand Sit to Stand: Supervision Stand pivot transfers: Supervision       General transfer comment: Required VC for technique (push from surface), look up  Ambulation/Gait Ambulation/Gait assistance: Supervision Gait Distance (Feet): 60 Feet Assistive device: Rolling walker (2 wheeled) Gait Pattern/deviations:  Decreased step length - right;Decreased step length - left;Decreased stride length;Trunk flexed Gait velocity: decreased   General Gait Details: small steps taken, cadence decreased with time due to fatigue, verbal cues for keeping head up.   Stairs             Wheelchair Mobility    Modified Rankin (Stroke Patients Only)       Balance Overall balance assessment: Needs assistance Sitting-balance support: Feet supported;No upper extremity supported Sitting balance-Leahy Scale: Good Sitting balance - Comments: seated in recliner Postural control: Other (comment)(anterior lean) Standing balance support: Bilateral upper extremity supported;During functional activity   Standing balance comment: using RW                            Cognition Arousal/Alertness: Awake/alert Behavior During Therapy: WFL for tasks assessed/performed Overall Cognitive Status: Within Functional Limits for tasks assessed                                        Exercises General Exercises - Lower Extremity Long Arc Quad: AROM;Strengthening;Both;10 reps;Seated Toe Raises: AROM;Strengthening;Seated;Both;10 reps Heel Raises: AROM;Strengthening;Seated;Both;10 reps    General Comments        Pertinent Vitals/Pain Pain Assessment: Faces Pain Score: 0-No pain Faces Pain Scale: Hurts a little bit Pain Location: in neck and shoulder Bilateral    Home Living Family/patient expects to be discharged to:: Private residence Living Arrangements: Alone Available Help at Discharge: Family;Available PRN/intermittently Type of Home: House Home Access: Stairs to enter Entrance Stairs-Rails: Right Home Layout: One level Home Equipment: Gilford Rile -  2 wheels;Cane - single point;Shower seat;Wheelchair - manual      Prior Function Level of Independence: Needs assistance  Gait / Transfers Assistance Needed: short distanced household ambulation pulling wheelchair behind her, otherwise  leans on nearby objects for support ADL's / Homemaking Assistance Needed: assisted by family PRN, but not available 24/7     PT Goals (current goals can now be found in the care plan section) Acute Rehab PT Goals Patient Stated Goal: To get better and return home PT Goal Formulation: With patient Time For Goal Achievement: 09/09/18 Potential to Achieve Goals: Good    Frequency    7X/week      PT Plan Current plan remains appropriate    Co-evaluation              AM-PAC PT "6 Clicks" Mobility   Outcome Measure  Help needed turning from your back to your side while in a flat bed without using bedrails?: A Little Help needed moving from lying on your back to sitting on the side of a flat bed without using bedrails?: A Little Help needed moving to and from a bed to a chair (including a wheelchair)?: A Little Help needed standing up from a chair using your arms (e.g., wheelchair or bedside chair)?: A Little Help needed to walk in hospital room?: A Little Help needed climbing 3-5 steps with a railing? : A Lot 6 Click Score: 17    End of Session   Activity Tolerance: Patient limited by fatigue Patient left: in chair;with chair alarm set;with call bell/phone within reach;Other (comment) Nurse Communication: Mobility status;Other (comment) PT Visit Diagnosis: Unsteadiness on feet (R26.81);Other abnormalities of gait and mobility (R26.89);Muscle weakness (generalized) (M62.81);History of falling (Z91.81)     Time: 1007-1219 PT Time Calculation (min) (ACUTE ONLY): 11 min  Charges:  $Therapeutic Activity: 8-22 mins                     10:52 AM, 08/31/18 Jerene Pitch, DPT Physical Therapy with [facility]  [phone] office

## 2018-08-31 NOTE — Discharge Summary (Signed)
Physician Discharge Summary  [name] XBL:[id number] DOB: 05-Dec-1933 DOA: 08/25/2018  PCP: Cassandra School, MD  Admit date: 08/25/2018 Discharge date: 08/31/2018  Time spent: 35 minutes  Recommendations for Outpatient Follow-up:  1. Patient remains a full code at time of discharge 2. Repeat basic metabolic panel to follow electrolytes and renal function in 1 week 3. Continue to follow blood pressure and further adjust antihypertensive regimen as needed. 4. Vestibular training, conditioning and rehabilitation as per skilled nursing facility protocol.   Discharge Diagnoses:  Principal Problem:   CVA (cerebral vascular accident) Acute subcentimeter infarction in the right Corpus Callosum Active Problems:   HLD (hyperlipidemia)   Essential hypertension   Atrial fibrillation (HCC)   Chronic anticoagulation/Eliquis for Afib   Acute CVA (cerebrovascular accident) ([place])   Rheumatoid arthritis ([place])   Discharge Condition: Stable and improved.  Patient discharged to skilled nursing facility for further care and rehabilitation.  Diet recommendation: Heart healthy diet  Filed Weights   08/25/18 1108  Weight: 54.4 kg    Brief of present illness:  85 y.o.femalewith past medical history relevant for chronic atrial fibrillation (on Eliquis for anticoagulation), HTN,,HLD, rheumatoid arthritis admitted with expressive aphasia and found to have acute stroke of the right corpus callosum.  Hospital Course:  1)Acute CVA -right corpus callosum CVA with expressive aphasia that is now resolved--place on telemetry monitored -patient remains in A. fib -Patient received aspirin, given recurrent episodes of expressive aphasia and left-sided deficits discussed with Dr. Simone Nguyen the neurologist, he advised continue aspirin 81 mg for the next [redacted] weeks along with Eliquis 2.5 mg twice daily in the setting of A. Fib.  She will need neurology follow-up in the next 6 weeks. ---MRI brain showed acute  subcentimeter infarction in the right MRA head and neck without LVO -echocardiogramfrom 08/23/2018 with preserved EF of 60 to 65% without acute findings.  --Direct LDL 177, - triglycerides are 608, total cholesterol is 248 and HDL is 33, non-HDL cholesterol is 215, started on Crestor since she did not tolerate Lipitor in the past. -Repeat c-Met and lipid profile in 8 weeks. -A1c 5.3, -TSH is 0.87 -PT  eval appreciated, SNF rehab recommended -Given ongoing symptoms of lightheadedness and dizziness patient started on meclizine 12.5 mg twice a day.  2)chronic atrial fibrillation- -continue Eliquis 2.5 mg twice daily for secondary stroke prevention -Continue adjusted dose of metoprolol XL; 75 mg daily to assure rate control. -Continue outpatient follow-up with cardiology service.   3) rheumatoid arthritis  -At baseline she is able to walk short distances with walking/assistive device, otherwise she uses wheelchair due to previous back surgeries and rheumatoid arthritis. -Continue Arava  4)social/ethics--- plan ofcare discussed with patient and daughter [name] at bedside, she is a full code. -Patient declines long-term placement in a facility, but is in agreement with short-term rehabilitation to acquire better conditioning prior to go home.  5)Disposition/- Patient without findings suggesting seizure activity;  following recommendations by PT/OT recommendation for skilled nursing facility placement for further rehabilitation and conditioning has been provided.  Patient will be discharged to [facility] for further care and rehabilitation.    Procedures:  See below for x-ray reports  EEG: Demonstrating no acute epileptic waves  2-D echo:  08/23/2018 with preserved EF of 60 to 65% without acute findings.  Consultations:  Neurology service  Discharge Exam: Vitals:   08/31/18 0833 08/31/18 0908  BP:  (!) 114/58  Nguyen:  97  Resp:  17  Temp:  98.5 F (36.9 C)  SpO2: 95% 96%    General exam: Alert, awake, oriented x 3; reports feeling dizzy, weak and deconditioned.  No chest pain, no nausea, no vomiting, no shortness of breath.  Patient is afebrile. Respiratory system: Clear to auscultation. Respiratory effort normal. Cardiovascular system:irregular. No murmurs, rubs, gallops. Gastrointestinal system: Abdomen is nondistended, soft and nontender. No organomegaly or masses felt. Normal bowel sounds heard. Central nervous system: Alert and oriented.  No dysarthria, no pronator drift.  Intact cranial nerves.  Muscle Strength 4 out of 5 bilaterally (L > R).  Normal muscle tone. Extremities: No C/C/E, +pedal pulses Skin: No rashes, lesions or ulcers Psychiatry: Judgement and insight appear normal.  Flat affect.  Discharge Instructions   Discharge Instructions    Diet - low sodium heart healthy   Complete by: As directed    Discharge instructions   Complete by: As directed    Take medications as prescribed Maintain adequate hydration Follow-up with neurology service (Dr. Merlene Nguyen) in 6 weeks Follow heart healthy diet. Physical rehabilitation and conditioning as per skilled nursing facility protocol.     Allergies as of 08/31/2018      Reactions   Iodinated Diagnostic Agents Anaphylaxis   08-12-10 Pt had severe reaction with itching/hives/loss of consciousness within 5 minutes of CEPI today.  She received both Kenalog and Iondinated Contrast, so not certain which agent caused reaction.  If patient returns for another CEPI, Dr. Jobe Nguyen considers using Decadron and a 13-hour prep.  jkl   Nerve Block Tray Anaphylaxis   Patient states that she was sent to have Epidural for her shoulder. After rec'ving she remembers saying that she was itching all over  And that's it. She was transported to hospital  By EMS. Stayed overnight.   Nitrofuran Derivatives Hives, Nausea And Vomiting   Triamcinolone Acetonide Anaphylaxis   08-12-10  Pt had severe reaction with  itching/hives/loss of consciousness within five minutes of CEPI today.  She received both Kenalog and Iodinated Contrast, so not certain which agent caused reaction.  If pt returns for another cervical epidural steroid injection, Dr. Jobe Nguyen suggests using Decadron and a 13-hr prep.  JKL PATIENT USES PROCTOCREAM 2.5% WITHOUT DIFFICULTY FOR VULVAR DYSTROPHY Patient says she can take prednisone without any problem   Vibra-tab [doxycycline] Nausea And Vomiting      Medication List    STOP taking these medications   HYDROcodone-acetaminophen 5-325 MG tablet Commonly known as: NORCO/VICODIN     TAKE these medications   acetaminophen 325 MG tablet Commonly known as: TYLENOL Take 2 tablets (650 mg total) by mouth every 6 (six) hours as needed for mild pain or headache (or Fever >/= 101).   apixaban 2.5 MG Tabs tablet Commonly known as: ELIQUIS Take 1 tablet (2.5 mg total) by mouth 2 (two) times daily.   aspirin 81 MG chewable tablet Chew 1 tablet (81 mg total) by mouth daily. Start taking on: September 01, 2018   cholecalciferol 25 MCG (1000 UT) tablet Commonly known as: VITAMIN D3 Take 2,000 Units by mouth 2 (two) times daily.   leflunomide 20 MG tablet Commonly known as: ARAVA Take 20 mg by mouth daily.   meclizine 12.5 MG tablet Commonly known as: ANTIVERT Take 1 tablet (12.5 mg total) by mouth 2 (two) times daily.   metoprolol succinate 25 MG 24 hr tablet Commonly known as: TOPROL-XL Take 3 tablets (75 mg total) by mouth daily. Take with or immediately following a meal. Start taking on: September 01, 2018 What changed:   medication  strength  how much to take  when to take this  additional instructions   omeprazole 20 MG capsule Commonly known as: PRILOSEC TAKE ONE CAPSULE BY MOUTH TWICE DAILY.   rosuvastatin 20 MG tablet Commonly known as: CRESTOR Take 1 tablet (20 mg total) by mouth daily at 6 PM.      Allergies  Allergen Reactions  . Iodinated Diagnostic  Agents Anaphylaxis    08-12-10 Pt had severe reaction with itching/hives/loss of consciousness within 5 minutes of CEPI today.  She received both Kenalog and Iondinated Contrast, so not certain which agent caused reaction.  If patient returns for another CEPI, Dr. Jobe Nguyen considers using Decadron and a 13-hour prep.  jkl  . Nerve Block Tray Anaphylaxis    Patient states that she was sent to have Epidural for her shoulder. After rec'ving she remembers saying that she was itching all over  And that's it. She was transported to hospital  By EMS. Stayed overnight.  [place] Spikes Derivatives Hives and Nausea And Vomiting  . Triamcinolone Acetonide Anaphylaxis    08-12-10  Pt had severe reaction with itching/hives/loss of consciousness within five minutes of CEPI today.  She received both Kenalog and Iodinated Contrast, so not certain which agent caused reaction.  If pt returns for another cervical epidural steroid injection, Dr. Jobe Nguyen suggests using Decadron and a 13-hr prep.  JKL PATIENT USES PROCTOCREAM 2.5% WITHOUT DIFFICULTY FOR VULVAR DYSTROPHY Patient says she can take prednisone without any problem  . Vibra-Tab [Doxycycline] Nausea And Vomiting    Contact information for follow-up providers    Cassandra School, MD. Schedule an appointment as soon as possible for a visit in 2 week(s).   Specialty: Internal Medicine Why: after discharge from SNF Contact information: [address] [place] [address] [phone]            Contact information for after-discharge care    [facility] .   Service: Skilled Nursing Contact information: [address] [place] [phone]                  The results of significant diagnostics from this hospitalization (including imaging, microbiology, ancillary and laboratory) are listed below for reference.    Significant Diagnostic Studies: Dg Chest 2  View  Result Date: 08/25/2018 CLINICAL DATA:  Shortness of breath. Cough. EXAM: CHEST - 2 VIEW COMPARISON:  Radiograph 08/22/2018 FINDINGS: The cardiomediastinal contours are unchanged with aortic atherosclerosis. Heart is normal in size. Chronic elevation of right hemidiaphragm. Mild chronic interstitial coarsening is unchanged. Pulmonary vasculature is normal. No consolidation, pleural effusion, or pneumothorax. No acute osseous abnormalities are seen. Vertebroplasty in the upper lumbar spine and postsurgical change in the cervical spine, partially included. IMPRESSION: 1. No acute chest finding. 2.  Aortic Atherosclerosis (ICD10-I70.0). Electronically Signed   By: Keith Rake M.D.   On: 08/25/2018 12:39   Dg Chest 2 View  Result Date: 08/22/2018 CLINICAL DATA:  Shortness of breath on exertion EXAM: CHEST - 2 VIEW COMPARISON:  02/24/2017 FINDINGS: Elevation of the right hemidiaphragm is noted. The heart is within normal limits. Aortic calcifications are noted. No focal infiltrate or sizable effusion is seen. Changes of prior vertebral augmentation are noted. Postsurgical changes in the cervical spine are seen as well. IMPRESSION: No acute abnormality noted. Electronically Signed   By: Inez Catalina M.D.   On: 08/22/2018 14:33   Ct Head Wo Contrast  Result Date: 08/25/2018 CLINICAL DATA:  Expressive aphasia EXAM: CT HEAD WITHOUT CONTRAST TECHNIQUE: Contiguous axial images were obtained from the base of the skull through the vertex without intravenous contrast. COMPARISON:  January 02, 2017 FINDINGS: Brain: Mild diffuse atrophy is stable. There is no intracranial mass, hemorrhage, extra-axial fluid collection, or midline shift. There is patchy periventricular small vessel disease in the centra semiovale bilaterally. No acute appearing infarct is evident on this study. Vascular: There is no appreciable hyperdense vessel. There is calcification in each distal vertebral artery and carotid siphon region.  Skull: Bony calvarium appears intact. Sinuses/Orbits: There is mucosal thickening in the left maxillary antrum with small air-fluid level. There is slight mucosal thickening in several ethmoid air cells. There is a 4 x 4 mm benign osteoma in the left ethmoid region. Orbits appear symmetric bilaterally. Other: Mastoid air cells are clear. IMPRESSION: Mild atrophy with patchy periventricular small vessel disease. No acute infarct. No mass or hemorrhage. There are foci of arterial vascular calcification. There are foci of relatively mild paranasal sinus disease. Electronically Signed   By: Lowella Grip III M.D.   On: 08/25/2018 12:21   Mr Angio Head Wo Contrast  Result Date: 08/25/2018 CLINICAL DATA:  Vertigo.  TIA.  Weakness.  Difficulty standing. EXAM: MRI HEAD WITHOUT CONTRAST MRA HEAD WITHOUT CONTRAST MRA NECK WITHOUT and with CONTRAST TECHNIQUE: Multiplanar, multiecho Nguyen sequences of the brain and surrounding structures were obtained without intravenous contrast. Angiographic images of the Circle of Willis were obtained using MRA technique without intravenous contrast. Angiographic images of the neck were obtained using MRA technique without intravenous contrast. Carotid stenosis measurements (when applicable) are obtained utilizing NASCET criteria, using the distal internal carotid diameter as the denominator. COMPARISON:  None. CONTRAST:  6 cc Gadavist FINDINGS: MRI HEAD FINDINGS Brain: Diffusion imaging shows a subcentimeter acute infarction within the right splenium of the corpus callosum. No other acute infarction. There chronic small-vessel ischemic changes of the pons. There are old small vessel cerebellar infarctions. There are old small vessel infarctions affecting the thalami, basal ganglia and hemispheric white matter. No large vessel territory infarction. No mass lesion, hemorrhage, hydrocephalus or extra-axial collection. Vascular: Major vessels at the base of the brain show flow. Skull  and upper cervical spine: Negative Sinuses/Orbits: Mild mucosal inflammatory changes of the left maxillary sinus. Other sinuses clear. Orbits negative. Other: None MRA HEAD FINDINGS Both internal carotid arteries are widely patent into the brain. No siphon stenosis. The anterior and middle cerebral vessels are patent without proximal stenosis, aneurysm or vascular malformation. Both vertebral arteries are widely patent to the basilar. No basilar stenosis. Posterior circulation branch vessels appear normal. MRA NECK FINDINGS Branching pattern of the brachiocephalic vessels from the arch is normal. No origin stenosis. Both common carotid arteries are widely patent their respective bifurcation. There is mild atherosclerotic disease at both carotid bifurcations. 20% stenosis of the proximal ICA on the right. No measurable stenosis on the left. Cervical internal carotid arteries widely patent beyond that. Both vertebral arteries are widely patent at their origins and through the cervical region to the foramen magnum and basilar. IMPRESSION: Background pattern of chronic small vessel ischemic changes throughout the brain. Acute subcentimeter infarction in the right splenium of the corpus callosum which could be symptomatic. No sign of hemorrhage or mass effect. Atherosclerotic change of the carotid bifurcations. 20% stenosis of the proximal ICA on the right. No stenosis on the left. Negative intracranial MR angiography. No large or medium vessel occlusion. Electronically Signed   By: Nelson Chimes  M.D.   On: 08/25/2018 15:58   Mr Angio Neck W Wo Contrast  Result Date: 08/25/2018 CLINICAL DATA:  Vertigo.  TIA.  Weakness.  Difficulty standing. EXAM: MRI HEAD WITHOUT CONTRAST MRA HEAD WITHOUT CONTRAST MRA NECK WITHOUT and with CONTRAST TECHNIQUE: Multiplanar, multiecho Nguyen sequences of the brain and surrounding structures were obtained without intravenous contrast. Angiographic images of the Circle of Willis were  obtained using MRA technique without intravenous contrast. Angiographic images of the neck were obtained using MRA technique without intravenous contrast. Carotid stenosis measurements (when applicable) are obtained utilizing NASCET criteria, using the distal internal carotid diameter as the denominator. COMPARISON:  None. CONTRAST:  6 cc Gadavist FINDINGS: MRI HEAD FINDINGS Brain: Diffusion imaging shows a subcentimeter acute infarction within the right splenium of the corpus callosum. No other acute infarction. There chronic small-vessel ischemic changes of the pons. There are old small vessel cerebellar infarctions. There are old small vessel infarctions affecting the thalami, basal ganglia and hemispheric white matter. No large vessel territory infarction. No mass lesion, hemorrhage, hydrocephalus or extra-axial collection. Vascular: Major vessels at the base of the brain show flow. Skull and upper cervical spine: Negative Sinuses/Orbits: Mild mucosal inflammatory changes of the left maxillary sinus. Other sinuses clear. Orbits negative. Other: None MRA HEAD FINDINGS Both internal carotid arteries are widely patent into the brain. No siphon stenosis. The anterior and middle cerebral vessels are patent without proximal stenosis, aneurysm or vascular malformation. Both vertebral arteries are widely patent to the basilar. No basilar stenosis. Posterior circulation branch vessels appear normal. MRA NECK FINDINGS Branching pattern of the brachiocephalic vessels from the arch is normal. No origin stenosis. Both common carotid arteries are widely patent their respective bifurcation. There is mild atherosclerotic disease at both carotid bifurcations. 20% stenosis of the proximal ICA on the right. No measurable stenosis on the left. Cervical internal carotid arteries widely patent beyond that. Both vertebral arteries are widely patent at their origins and through the cervical region to the foramen magnum and basilar.  IMPRESSION: Background pattern of chronic small vessel ischemic changes throughout the brain. Acute subcentimeter infarction in the right splenium of the corpus callosum which could be symptomatic. No sign of hemorrhage or mass effect. Atherosclerotic change of the carotid bifurcations. 20% stenosis of the proximal ICA on the right. No stenosis on the left. Negative intracranial MR angiography. No large or medium vessel occlusion. Electronically Signed   By: Nelson Chimes M.D.   On: 08/25/2018 15:58   Mr Brain Wo Contrast  Result Date: 08/25/2018 CLINICAL DATA:  Vertigo.  TIA.  Weakness.  Difficulty standing. EXAM: MRI HEAD WITHOUT CONTRAST MRA HEAD WITHOUT CONTRAST MRA NECK WITHOUT and with CONTRAST TECHNIQUE: Multiplanar, multiecho Nguyen sequences of the brain and surrounding structures were obtained without intravenous contrast. Angiographic images of the Circle of Willis were obtained using MRA technique without intravenous contrast. Angiographic images of the neck were obtained using MRA technique without intravenous contrast. Carotid stenosis measurements (when applicable) are obtained utilizing NASCET criteria, using the distal internal carotid diameter as the denominator. COMPARISON:  None. CONTRAST:  6 cc Gadavist FINDINGS: MRI HEAD FINDINGS Brain: Diffusion imaging shows a subcentimeter acute infarction within the right splenium of the corpus callosum. No other acute infarction. There chronic small-vessel ischemic changes of the pons. There are old small vessel cerebellar infarctions. There are old small vessel infarctions affecting the thalami, basal ganglia and hemispheric white matter. No large vessel territory infarction. No mass lesion, hemorrhage, hydrocephalus or extra-axial collection. Vascular: Major  vessels at the base of the brain show flow. Skull and upper cervical spine: Negative Sinuses/Orbits: Mild mucosal inflammatory changes of the left maxillary sinus. Other sinuses clear. Orbits  negative. Other: None MRA HEAD FINDINGS Both internal carotid arteries are widely patent into the brain. No siphon stenosis. The anterior and middle cerebral vessels are patent without proximal stenosis, aneurysm or vascular malformation. Both vertebral arteries are widely patent to the basilar. No basilar stenosis. Posterior circulation branch vessels appear normal. MRA NECK FINDINGS Branching pattern of the brachiocephalic vessels from the arch is normal. No origin stenosis. Both common carotid arteries are widely patent their respective bifurcation. There is mild atherosclerotic disease at both carotid bifurcations. 20% stenosis of the proximal ICA on the right. No measurable stenosis on the left. Cervical internal carotid arteries widely patent beyond that. Both vertebral arteries are widely patent at their origins and through the cervical region to the foramen magnum and basilar. IMPRESSION: Background pattern of chronic small vessel ischemic changes throughout the brain. Acute subcentimeter infarction in the right splenium of the corpus callosum which could be symptomatic. No sign of hemorrhage or mass effect. Atherosclerotic change of the carotid bifurcations. 20% stenosis of the proximal ICA on the right. No stenosis on the left. Negative intracranial MR angiography. No large or medium vessel occlusion. Electronically Signed   By: Nelson Chimes M.D.   On: 08/25/2018 15:58    Microbiology: Recent Results (from the past 240 hour(s))  SARS Coronavirus 2 [facility] order, Performed in [facility] hospital lab) Nasopharyngeal Nasopharyngeal Swab     Status: None   Collection Time: 08/25/18  1:24 PM   Specimen: Nasopharyngeal Swab  Result Value Ref Range Status   SARS Coronavirus 2 NEGATIVE NEGATIVE Final    Comment: (NOTE) If result is NEGATIVE SARS-CoV-2 target nucleic acids are NOT DETECTED. The SARS-CoV-2 RNA is generally detectable in upper and lower  respiratory specimens during the acute phase  of infection. The lowest  concentration of SARS-CoV-2 viral copies this assay can detect is 250  copies / mL. A negative result does not preclude SARS-CoV-2 infection  and should not be used as the sole basis for treatment or other  patient management decisions.  A negative result may occur with  improper specimen collection / handling, submission of specimen other  than nasopharyngeal swab, presence of viral mutation(s) within the  areas targeted by this assay, and inadequate number of viral copies  (<250 copies / mL). A negative result must be combined with clinical  observations, patient history, and epidemiological information. If result is POSITIVE SARS-CoV-2 target nucleic acids are DETECTED. The SARS-CoV-2 RNA is generally detectable in upper and lower  respiratory specimens dur ing the acute phase of infection.  Positive  results are indicative of active infection with SARS-CoV-2.  Clinical  correlation with patient history and other diagnostic information is  necessary to determine patient infection status.  Positive results do  not rule out bacterial infection or co-infection with other viruses. If result is PRESUMPTIVE POSTIVE SARS-CoV-2 nucleic acids MAY BE PRESENT.   A presumptive positive result was obtained on the submitted specimen  and confirmed on repeat testing.  While 2019 novel coronavirus  (SARS-CoV-2) nucleic acids may be present in the submitted sample  additional confirmatory testing may be necessary for epidemiological  and / or clinical management purposes  to differentiate between  SARS-CoV-2 and other Sarbecovirus currently known to infect humans.  If clinically indicated additional testing with an alternate test  methodology [phone])  is advised. The SARS-CoV-2 RNA is generally  detectable in upper and lower respiratory sp ecimens during the acute  phase of infection. The expected result is Negative. Fact Sheet for Patients:   StrictlyIdeas.no Fact Sheet for Healthcare Providers: BankingDealers.co.za This test is not yet approved or cleared by the [place] FDA and has been authorized for detection and/or diagnosis of SARS-CoV-2 by FDA under an Emergency Use Authorization (EUA).  This EUA will remain in effect (meaning this test can be used) for the duration of the COVID-19 declaration under Section 564(b)(1) of the Act, 21 U.S.C. section 360bbb-3(b)(1), unless the authorization is terminated or revoked sooner. Performed at [facility], [address] [place], [place] [address]      Labs: Basic Metabolic Panel: Recent Labs  Lab 08/25/18 1237 08/26/18 0545 08/30/18 0839  NA 140 140 139  K 4.0 3.5 3.9  CL 104 103 104  CO2 '27 25 25  '$ GLUCOSE 101* 123* 109*  BUN '11 13 14  '$ CREATININE 0.63 0.81 0.64  CALCIUM 8.6* 8.8* 8.8*  MG  --   --  1.6*   Liver Function Tests: Recent Labs  Lab 08/25/18 1237  AST 22  ALT 13  ALKPHOS 60  BILITOT 0.6  PROT 6.9  ALBUMIN 3.7   CBC: Recent Labs  Lab 08/25/18 1237 08/26/18 0545 08/29/18 0452 08/31/18 0507  WBC 5.5 5.9 6.1 7.0  HGB 12.7 12.9 11.6* 12.0  HCT 40.4 41.9 37.3 37.2  MCV 88.0 88.2 88.6 88.4  PLT 195 222 191 191    BNP (last 3 results) Recent Labs    08/22/18 1013  BNP 298.0*    CBG: Recent Labs  Lab 08/26/18 0518  GLUCAP 144*    Signed:  Barton Dubois MD.  [facility] 08/31/2018, 12:46 PM

## 2018-08-31 NOTE — Care Management Important Message (Signed)
Important Message  Patient Details  Name: [name] MRN: [id number] Date of Birth: 06/21/1933   Medicare Important Message Given:  Yes     Tommy Medal 08/31/2018, 2:03 PM

## 2018-08-31 NOTE — Evaluation (Signed)
Occupational Therapy Evaluation Patient Details Name: [name] MRN: [id number] DOB: 10-Nov-1933 Today's Date: 08/31/2018    History of Present Illness [name]  is a 85 y.o. female with past medical history relevant for chronic atrial fibrillation, HTN,, HLD, rheumatoid arthritis presented by EMS after episode of speech disturbance and altered mentation lasting 15 to 20 minutes. 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    Clinical Impression   Patient in bed upon therapy arrival and agreeable to participate in OT treatment session. Patient completed self feeding, grooming, and toileting tasks during session. Pt experienced two episodes in which she swayed backwards and caught herself at the sink. Patient reports this is not due to dizziness rather than her back gives out. Patient overall required min guard assist due to risk of falls. She was left in chair with chair alarm and call light within reach. OT will continue to follow acutely.     Follow Up Recommendations  SNF    Equipment Recommendations  None recommended by OT       Precautions / Restrictions Precautions Precautions: Fall Restrictions Weight Bearing Restrictions: No      Mobility Bed Mobility Overal bed mobility: Modified Independent Bed Mobility: Supine to Sit              Transfers Overall transfer level: Needs assistance Equipment used: Rolling walker (2 wheeled) Transfers: Sit to/from [facility] Sit to Stand: Supervision Stand pivot transfers: Supervision       General transfer comment: Required VC for technique (push from surface)        ADL either performed or assessed with clinical judgement   ADL Overall ADL's : Needs assistance/impaired Eating/Feeding: Set up;Bed level Eating/Feeding Details (indicate cue type and reason): Assistance opening drinks. Able to open sugar packet. Grooming: Wash/dry hands;Wash/dry face;Oral  care;Brushing hair;Min guard;Standing Grooming Details (indicate cue type and reason): Pt used sink for support during task. Became SOB when standing and talking.                 Toilet Transfer: Min guard;Regular Toilet;Grab bars;RW;Ambulation   Writer and Hygiene: Supervision/safety;Sitting/lateral lean       Functional mobility during ADLs: Min guard;Rolling walker       Vision Baseline Vision/History: Wears glasses Wears Glasses: Reading only              Pertinent Vitals/Pain Pain Assessment: No/denies pain Pain Score: 0-No pain              Cognition Arousal/Alertness: Awake/alert Behavior During Therapy: WFL for tasks assessed/performed Overall Cognitive Status: Within Functional Limits for tasks assessed                 OT Problem List: Decreased strength;Decreased activity tolerance;Impaired balance (sitting and/or standing);Decreased safety awareness      OT Treatment/Interventions: Self-care/ADL training;Therapeutic exercise;Neuromuscular education;Therapeutic activities;Patient/family education    OT Goals(Current goals can be found in the care plan section)    OT Frequency: Min 2X/week    AM-PAC OT "6 Clicks" Daily Activity     Outcome Measure Help from another person eating meals?: A Little Help from another person taking care of personal grooming?: A Little Help from another person toileting, which includes using toliet, bedpan, or urinal?: A Little Help from another person bathing (including washing, rinsing, drying)?: A Little Help from another person to put on and taking off regular upper body clothing?: A Little Help from another person to put  on and taking off regular lower body clothing?: A Little 6 Click Score: 18   End of Session Equipment Utilized During Treatment: Rolling walker;Gait belt  Activity Tolerance: Patient tolerated treatment well Patient left: in chair;with call bell/phone within  reach;with chair alarm set  OT Visit Diagnosis: Muscle weakness (generalized) (M62.81)                Time: 9628-3662 OT Time Calculation (min): 28 min Charges:  OT General Charges $OT Visit: 1 Visit OT Treatments $Self Care/Home Management : 23-37 mins  Ailene Ravel, OTR/L,CBIS  [phone]   Isam Unrein, Clarene Duke 08/31/2018, 9:20 AM

## 2018-08-31 NOTE — Progress Notes (Signed)
Report called and given to Stefanie Libel. All questions were answered and no further questions at this time. Patient in stable condition and in no acute distress at time of discharge. Patient will be escorted by nurse tech to the [facility].

## 2018-09-01 ENCOUNTER — Encounter: Payer: Self-pay | Admitting: Adult Health

## 2018-09-01 ENCOUNTER — Non-Acute Institutional Stay (SKILLED_NURSING_FACILITY): Payer: Medicare Other | Admitting: Adult Health

## 2018-09-01 DIAGNOSIS — K219 Gastro-esophageal reflux disease without esophagitis: Secondary | ICD-10-CM | POA: Diagnosis not present

## 2018-09-01 DIAGNOSIS — I639 Cerebral infarction, unspecified: Secondary | ICD-10-CM | POA: Diagnosis not present

## 2018-09-01 DIAGNOSIS — I10 Essential (primary) hypertension: Secondary | ICD-10-CM | POA: Diagnosis not present

## 2018-09-01 DIAGNOSIS — I4811 Longstanding persistent atrial fibrillation: Secondary | ICD-10-CM | POA: Diagnosis not present

## 2018-09-01 DIAGNOSIS — E782 Mixed hyperlipidemia: Secondary | ICD-10-CM

## 2018-09-01 DIAGNOSIS — M069 Rheumatoid arthritis, unspecified: Secondary | ICD-10-CM

## 2018-09-01 DIAGNOSIS — R42 Dizziness and giddiness: Secondary | ICD-10-CM

## 2018-09-01 NOTE — Progress Notes (Signed)
Location:   [place] Room Number: 160 p Place of Service:  SNF (31)   CODE STATUS: DNR  Allergies  Allergen Reactions  . Iodinated Diagnostic Agents Anaphylaxis    08-12-10 Pt had severe reaction with itching/hives/loss of consciousness within 5 minutes of CEPI today.  She received both Kenalog and Iondinated Contrast, so not certain which agent caused reaction.  If patient returns for another CEPI, Dr. Jobe Igo considers using Decadron and a 13-hour prep.  jkl  . Nerve Block Tray Anaphylaxis    Patient states that she was sent to have Epidural for her shoulder. After rec'ving she remembers saying that she was itching all over  And that's it. She was transported to hospital  By EMS. Stayed overnight.  [place] Spikes Derivatives Hives and Nausea And Vomiting  . Triamcinolone Acetonide Anaphylaxis    08-12-10  Pt had severe reaction with itching/hives/loss of consciousness within five minutes of CEPI today.  She received both Kenalog and Iodinated Contrast, so not certain which agent caused reaction.  If pt returns for another cervical epidural steroid injection, Dr. Jobe Igo suggests using Decadron and a 13-hr prep.  JKL PATIENT USES PROCTOCREAM 2.5% WITHOUT DIFFICULTY FOR VULVAR DYSTROPHY Patient says she can take prednisone without any problem  . Vibra-Tab [Doxycycline] Nausea And Vomiting    Chief Complaint  Patient presents with  . Hospitalization Follow-up    Hospital Follow up    HPI:  She is a 85 year old woman who has been hospitalized from 08-25-18 through 08-31-18.  She was hospitalized for an acute cva with expressive aphasia; which as resolved. Her goal is to return back home. She has chronic vertigo which is not worse. She denies any uncontrolled pain; no changes in her appetite. She will continue to be followed for her chronic illnesses including: cva; ra; afib.   Past Medical History:  Diagnosis Date  . A-fib ([place])   . Atrial fibrillation ([place])    . Cervicalgia   . Colon cancer ([place])   . Degeneration of cervical intervertebral disc   . Hypertension   . Pinched nerve in shoulder   . Rheumatoid arthritis(714.0)   . RUQ pain   . Vulvar dystrophy   . Yeast infection     Past Surgical History:  Procedure Laterality Date  . ABDOMINAL HYSTERECTOMY  1960'S  . APPENDECTOMY  with hysterectomy  . BACK SURGERY  12/08/10   plate/screws C3 C4  . CHOLECYSTECTOMY  2002  . COLONOSCOPY  04/02/2009  . COLONOSCOPY  06/04/05  . COLONOSCOPY N/A 07/26/2015   Procedure: COLONOSCOPY;  Surgeon: Rogene Houston, MD;  Location: [facility];  Service: Endoscopy;  Laterality: N/A;  1240  . COLONOSCOPY WITH ESOPHAGOGASTRODUODENOSCOPY (EGD) N/A 04/08/2012   Procedure: COLONOSCOPY WITH ESOPHAGOGASTRODUODENOSCOPY (EGD);  Surgeon: Rogene Houston, MD;  Location: [facility];  Service: Endoscopy;  Laterality: N/A;  130-moved to 245 Ann notified pt  . ESOPHAGOGASTRODUODENOSCOPY N/A 07/26/2015   Procedure: ESOPHAGOGASTRODUODENOSCOPY (EGD);  Surgeon: Rogene Houston, MD;  Location: [facility];  Service: Endoscopy;  Laterality: N/A;  . HEMICOLECTOMY  10/05    Social History   Socioeconomic History  . Marital status: Married    Spouse name: Not on file  . Number of children: Not on file  . Years of education: Not on file  . Highest education level: Not on file  Occupational History  . Not on file  Social Needs  . Financial resource strain: Not on file  .  Food insecurity    Worry: Not on file    Inability: Not on file  . Transportation needs    Medical: Not on file    Non-medical: Not on file  Tobacco Use  . Smoking status: Never Smoker  . Smokeless tobacco: Never Used  Substance and Sexual Activity  . Alcohol use: No  . Drug use: No  . Sexual activity: Never  Lifestyle  . Physical activity    Days per week: Not on file    Minutes per session: Not on file  . Stress: Not on file  Relationships  . Social Herbalist on phone:  Not on file    Gets together: Not on file    Attends religious service: Not on file    Active member of club or organization: Not on file    Attends meetings of clubs or organizations: Not on file    Relationship status: Not on file  . Intimate partner violence    Fear of current or ex partner: Not on file    Emotionally abused: Not on file    Physically abused: Not on file    Forced sexual activity: Not on file  Other Topics Concern  . Not on file  Social History Narrative  . Not on file   Family History  Problem Relation Age of Onset  . Rheum arthritis Mother   . Parkinsonism Father   . Hiatal hernia Sister   . Healthy Brother   . Healthy Daughter   . Healthy Son   . Healthy Son       VITAL SIGNS BP (!) 129/56   Pulse 79   Temp 98.4 F (36.9 C)   Resp 20   Ht 5\' 2"  (1.575 m)   Wt 118 lb (53.5 kg)   BMI 21.58 kg/m   Outpatient Encounter Medications as of 09/01/2018  Medication Sig  . acetaminophen (TYLENOL) 325 MG tablet Take 2 tablets (650 mg total) by mouth every 6 (six) hours as needed for mild pain or headache (or Fever >/= 101).  Marland Kitchen apixaban (ELIQUIS) 2.5 MG TABS tablet Take 1 tablet (2.5 mg total) by mouth 2 (two) times daily.  Marland Kitchen aspirin 81 MG chewable tablet Chew 1 tablet (81 mg total) by mouth daily.  . cholecalciferol (VITAMIN D3) 25 MCG (1000 UT) tablet Take 2,000 Units by mouth 2 (two) times daily.  Marland Kitchen leflunomide (ARAVA) 20 MG tablet Take 20 mg by mouth daily.  . meclizine (ANTIVERT) 12.5 MG tablet Take 1 tablet (12.5 mg total) by mouth 2 (two) times daily.  . metoprolol succinate (TOPROL-XL) 25 MG 24 hr tablet Take 3 tablets (75 mg total) by mouth daily. Take with or immediately following a meal.  . NON FORMULARY Diet Type:  NAS  . omeprazole (PRILOSEC) 20 MG capsule TAKE ONE CAPSULE BY MOUTH TWICE DAILY.  . rosuvastatin (CRESTOR) 20 MG tablet Take 1 tablet (20 mg total) by mouth daily at 6 PM.   No facility-administered encounter medications on file as  of 09/01/2018.      SIGNIFICANT DIAGNOSTIC EXAMS  TODAY:   08-23-18: 2-d echo:   1. The left ventricle has normal systolic function with an ejection fraction of 60-65%. The cavity size was normal. Left ventricular diastolic Doppler parameters are indeterminate.  2. The right ventricle has normal systolic function. The cavity was normal. There is no increase in right ventricular wall thickness.  3. Left atrial size was mildly dilated.  4. The aortic valve  has an indeterminate number of cusps. Mild thickening of the aortic valve. Mild calcification of the aortic valve. Aortic valve regurgitation is mild to moderate by color flow Doppler. Mild aortic annular calcification noted.  5. The mitral valve is abnormal. Mild thickening of the mitral valve leaflet. Mild calcification of the mitral valve leaflet. There is mild mitral annular calcification present. No evidence of mitral valve stenosis.  6. The aorta is normal in size and structure.  7. Pulmonary hypertension is indeterminant, inadequate TR jet.  08-25-18: chest x-ray: 1. No acute chest finding. 2.  Aortic Atherosclerosis   08-25-18: ct of head: Mild atrophy with patchy periventricular small vessel disease. No acute infarct. No mass or hemorrhage. There are foci of arterial vascular calcification. There are foci of relatively mild paranasal sinus disease  08-25-18: MRI of brain MRA of neck and head:  Background pattern of chronic small vessel ischemic changes throughout the brain. Acute subcentimeter infarction in the right splenium of the corpus callosum which could be symptomatic. No sign of hemorrhage or mass effect.      Atherosclerotic change of the carotid bifurcations. 20% stenosis of the proximal ICA on the right. No stenosis on the left. Negative intracranial MR angiography. No large or medium vessel Occlusion.  08-29-18: This is a normal recording of awake and drowsy states.   LABS REVIEWED TODAY;   08-25-18: wbc 5.5; hgb 12.7;  hct 40.4; mcv 88.0; plt 195; glucose 101; bun 11; creat 0.63; k+ 4.0; na++ 140; ca 8.6; liver normal albumin 3.7 tsh 0.877 hgb a1c 5.3 08-26-18: wbc 5.9; hgb 12.9; hct 41.9; mcv 88.2 plt 222; glucose 123; bun 13; creat 0.81; k+ 3.5; na++ 140; ca 8.8 chol 248; ldl 177.9; trig 608; hdl 33 08-30-18: mag 1.8 08-31-18: wbc 7.0; hgb 12.0; hct 37.5; mcv 88.4; plt 191; glucose 109; bun 14; creat 0.64; k+ 3.9; na++ 139; ca 8.8   Review of Systems  Constitutional: Negative for malaise/fatigue.  Respiratory: Negative for cough and shortness of breath.   Cardiovascular: Negative for chest pain, palpitations and leg swelling.  Gastrointestinal: Negative for abdominal pain, constipation and heartburn.  Musculoskeletal: Negative for back pain, joint pain and myalgias.  Skin: Negative.   Neurological: Positive for dizziness.       Is chronic   Psychiatric/Behavioral: The patient is not nervous/anxious.    Physical Exam Constitutional:      General: She is not in acute distress.    Appearance: She is well-developed. She is not diaphoretic.  Neck:     Musculoskeletal: Neck supple.     Thyroid: No thyromegaly.  Cardiovascular:     Rate and Rhythm: Normal rate. Rhythm irregular.     Pulses: Normal pulses.     Heart sounds: Normal heart sounds.  Pulmonary:     Effort: Pulmonary effort is normal. No respiratory distress.     Breath sounds: Wheezing present.     Comments: Few scattered wheezes will use I/S and flutter valve four times daily  Abdominal:     General: Bowel sounds are normal. There is no distension.     Palpations: Abdomen is soft.     Tenderness: There is no abdominal tenderness.  Musculoskeletal: Normal range of motion.     Right lower leg: No edema.     Left lower leg: No edema.  Lymphadenopathy:     Cervical: No cervical adenopathy.  Skin:    General: Skin is warm and dry.  Neurological:     Mental Status: She is  alert and oriented to person, place, and time.  Psychiatric:         Mood and Affect: Mood normal.     ASSESSMENT/ PLAN:  TODAY;   1. Cerebrovascular accident (cva) acute sub centimeter infarction in the right corpus callosum: is neurologically stable her expressive aphasia has resolved. Will continue therapy as directed; will follow up with neurology. Will continue asa 81 mg daily for total of 2 weeks. Will continue eliquis 2.5 mg twice daily   2. Longstanding persistent atrial fibrillation: heart rate is stable: will continue toprol xl 75 mg daily for rate control and eliquis 2.5 mg twice daily   3. Essential hypertension: is stable b/p 129/56: will continue toprol xl 75 mg daily   4. Rheumatoid arthritis of multiple sites unspecified rheumatoid factor: is stable will continue arava 20 mg daily   5. GERD without esophagitis: is stable will continue prilosec 20 mg twice daily   6. Mixed hyperlipidemia: is without change LDL 177.9 trig 608 will continue crestor 20 mg daily   7. Chronic vertigo: is without change: will continue meclizine 12.5 mg twice daily   8. Hypomagnesemia: is without change level is 1.8 will monitor  Will get bmp and mag level on 09-05-18.      MD is aware of resident's narcotic use and is in agreement with current plan of care. We will attempt to wean resident as apropriate   Ok Edwards NP [facility]  Contact [phone] Monday through Friday 8am- 5pm  After hours call [phone]

## 2018-09-02 ENCOUNTER — Other Ambulatory Visit: Payer: Self-pay | Admitting: Adult Health

## 2018-09-02 ENCOUNTER — Non-Acute Institutional Stay (SKILLED_NURSING_FACILITY): Payer: Medicare Other | Admitting: Adult Health

## 2018-09-02 ENCOUNTER — Encounter: Payer: Self-pay | Admitting: Adult Health

## 2018-09-02 DIAGNOSIS — G8929 Other chronic pain: Secondary | ICD-10-CM | POA: Diagnosis not present

## 2018-09-02 DIAGNOSIS — M549 Dorsalgia, unspecified: Secondary | ICD-10-CM | POA: Diagnosis not present

## 2018-09-02 DIAGNOSIS — I639 Cerebral infarction, unspecified: Secondary | ICD-10-CM

## 2018-09-02 DIAGNOSIS — I4811 Longstanding persistent atrial fibrillation: Secondary | ICD-10-CM

## 2018-09-02 MED ORDER — HYDROCODONE-ACETAMINOPHEN 7.5-325 MG PO TABS: 1.0000 | ORAL_TABLET | Freq: Three times a day (TID) | ORAL | 0 refills | Status: DC | PRN

## 2018-09-02 NOTE — Progress Notes (Signed)
Location:    [place] Room Number: 160/P Place of Service:  SNF (31)   CODE STATUS: DNR  Allergies  Allergen Reactions  . Iodinated Diagnostic Agents Anaphylaxis    08-12-10 Pt had severe reaction with itching/hives/loss of consciousness within 5 minutes of CEPI today.  She received both Kenalog and Iondinated Contrast, so not certain which agent caused reaction.  If patient returns for another CEPI, Dr. Jobe Igo considers using Decadron and a 13-hour prep.  jkl  . Nerve Block Tray Anaphylaxis    Patient states that she was sent to have Epidural for her shoulder. After rec'ving she remembers saying that she was itching all over  And that's it. She was transported to hospital  By EMS. Stayed overnight.  [place] Spikes Derivatives Hives and Nausea And Vomiting  . Triamcinolone Acetonide Anaphylaxis    08-12-10  Pt had severe reaction with itching/hives/loss of consciousness within five minutes of CEPI today.  She received both Kenalog and Iodinated Contrast, so not certain which agent caused reaction.  If pt returns for another cervical epidural steroid injection, Dr. Jobe Igo suggests using Decadron and a 13-hr prep.  JKL PATIENT USES PROCTOCREAM 2.5% WITHOUT DIFFICULTY FOR VULVAR DYSTROPHY Patient says she can take prednisone without any problem  . Vibra-Tab [Doxycycline] Nausea And Vomiting    Chief Complaint  Patient presents with  . Acute Visit    72 Hour Care Plan Meeting    HPI:  We have come together for her 52 hour care plan meeting. Her goal is to return home. She has a wheelchair and walker. She has steps has a side rail; but does not leave her house without assistance. Her family is very supportive of her. She has chronic back pain and has been on vicodin 7.5/35 mg three times daily as needed long term. She has a history of cervical spine surgery in the past.   Past Medical History:  Diagnosis Date  . A-fib ([place])   . Atrial fibrillation ([place])   .  Cervicalgia   . Colon cancer ([place])   . Degeneration of cervical intervertebral disc   . Hypertension   . Pinched nerve in shoulder   . Rheumatoid arthritis(714.0)   . RUQ pain   . Vulvar dystrophy   . Yeast infection     Past Surgical History:  Procedure Laterality Date  . ABDOMINAL HYSTERECTOMY  1960'S  . APPENDECTOMY  with hysterectomy  . BACK SURGERY  12/08/10   plate/screws C3 C4  . CHOLECYSTECTOMY  2002  . COLONOSCOPY  04/02/2009  . COLONOSCOPY  06/04/05  . COLONOSCOPY N/A 07/26/2015   Procedure: COLONOSCOPY;  Surgeon: Rogene Houston, MD;  [facility];  Service: Endoscopy;  Laterality: N/A;  1240  . COLONOSCOPY WITH ESOPHAGOGASTRODUODENOSCOPY (EGD) N/A 04/08/2012   Procedure: COLONOSCOPY WITH ESOPHAGOGASTRODUODENOSCOPY (EGD);  Surgeon: Rogene Houston, MD;  Location: [facility];  Service: Endoscopy;  Laterality: N/A;  130-moved to 245 Ann notified pt  . ESOPHAGOGASTRODUODENOSCOPY N/A 07/26/2015   Procedure: ESOPHAGOGASTRODUODENOSCOPY (EGD);  Surgeon: Rogene Houston, MD;  Location: [facility];  Service: Endoscopy;  Laterality: N/A;  . HEMICOLECTOMY  10/05    Social History   Socioeconomic History  . Marital status: Married    Spouse name: Not on file  . Number of children: Not on file  . Years of education: Not on file  . Highest education level: Not on file  Occupational History  . Not on file  Social Needs  .  Financial resource strain: Not on file  . Food insecurity    Worry: Not on file    Inability: Not on file  . Transportation needs    Medical: Not on file    Non-medical: Not on file  Tobacco Use  . Smoking status: Never Smoker  . Smokeless tobacco: Never Used  Substance and Sexual Activity  . Alcohol use: No  . Drug use: No  . Sexual activity: Never  Lifestyle  . Physical activity    Days per week: Not on file    Minutes per session: Not on file  . Stress: Not on file  Relationships  . Social Herbalist on phone: Not  on file    Gets together: Not on file    Attends religious service: Not on file    Active member of club or organization: Not on file    Attends meetings of clubs or organizations: Not on file    Relationship status: Not on file  . Intimate partner violence    Fear of current or ex partner: Not on file    Emotionally abused: Not on file    Physically abused: Not on file    Forced sexual activity: Not on file  Other Topics Concern  . Not on file  Social History Narrative  . Not on file   Family History  Problem Relation Age of Onset  . Rheum arthritis Mother   . Parkinsonism Father   . Hiatal hernia Sister   . Healthy Brother   . Healthy Daughter   . Healthy Son   . Healthy Son       VITAL SIGNS BP 126/77   Pulse 83   Temp 98.6 F (37 C) (Oral)   Resp 20   Wt 118 lb (53.5 kg)   BMI 21.58 kg/m   Outpatient Encounter Medications as of 09/02/2018  Medication Sig  . acetaminophen (TYLENOL) 325 MG tablet Take 2 tablets (650 mg total) by mouth every 6 (six) hours as needed for mild pain or headache (or Fever >/= 101).  Marland Kitchen apixaban (ELIQUIS) 2.5 MG TABS tablet Take 2.5 mg by mouth 2 (two) times daily.  Marland Kitchen aspirin 81 MG chewable tablet Chew 1 tablet (81 mg total) by mouth daily.  . cholecalciferol (VITAMIN D3) 25 MCG (1000 UT) tablet Take 2,000 Units by mouth 2 (two) times daily.  Marland Kitchen HYDROcodone-acetaminophen (NORCO) 7.5-325 MG tablet Take 1 tablet by mouth 3 (three) times daily as needed for moderate pain.  Marland Kitchen leflunomide (ARAVA) 20 MG tablet Take 20 mg by mouth daily.  . meclizine (ANTIVERT) 12.5 MG tablet Take 1 tablet (12.5 mg total) by mouth 2 (two) times daily.  . metoprolol succinate (TOPROL-XL) 25 MG 24 hr tablet Take 3 tablets (75 mg total) by mouth daily. Take with or immediately following a meal.  . NON FORMULARY Diet Type: Regular,  NAS Consistent Carbohydrate  . omeprazole (PRILOSEC) 20 MG capsule TAKE ONE CAPSULE BY MOUTH TWICE DAILY.  . rosuvastatin (CRESTOR) 20 MG  tablet Take 1 tablet (20 mg total) by mouth daily at 6 PM.  . [DISCONTINUED] apixaban (ELIQUIS) 2.5 MG TABS tablet Take 1 tablet (2.5 mg total) by mouth 2 (two) times daily.   No facility-administered encounter medications on file as of 09/02/2018.      SIGNIFICANT DIAGNOSTIC EXAMS  PREVIOUS:   08-23-18: 2-d echo:   1. The left ventricle has normal systolic function with an ejection fraction of 60-65%. The cavity size was  normal. Left ventricular diastolic Doppler parameters are indeterminate.  2. The right ventricle has normal systolic function. The cavity was normal. There is no increase in right ventricular wall thickness.  3. Left atrial size was mildly dilated.  4. The aortic valve has an indeterminate number of cusps. Mild thickening of the aortic valve. Mild calcification of the aortic valve. Aortic valve regurgitation is mild to moderate by color flow Doppler. Mild aortic annular calcification noted.  5. The mitral valve is abnormal. Mild thickening of the mitral valve leaflet. Mild calcification of the mitral valve leaflet. There is mild mitral annular calcification present. No evidence of mitral valve stenosis.  6. The aorta is normal in size and structure.  7. Pulmonary hypertension is indeterminant, inadequate TR jet.  08-25-18: chest x-ray: 1. No acute chest finding. 2.  Aortic Atherosclerosis   08-25-18: ct of head: Mild atrophy with patchy periventricular small vessel disease. No acute infarct. No mass or hemorrhage. There are foci of arterial vascular calcification. There are foci of relatively mild paranasal sinus disease  08-25-18: MRI of brain MRA of neck and head:  Background pattern of chronic small vessel ischemic changes throughout the brain. Acute subcentimeter infarction in the right splenium of the corpus callosum which could be symptomatic. No sign of hemorrhage or mass effect.      Atherosclerotic change of the carotid bifurcations. 20% stenosis of the proximal ICA  on the right. No stenosis on the left. Negative intracranial MR angiography. No large or medium vessel Occlusion.  08-29-18: This is a normal recording of awake and drowsy states.    NO NEW EXAMS.    LABS REVIEWED PREVIOUS;   08-25-18: wbc 5.5; hgb 12.7; hct 40.4; mcv 88.0; plt 195; glucose 101; bun 11; creat 0.63; k+ 4.0; na++ 140; ca 8.6; liver normal albumin 3.7 tsh 0.877 hgb a1c 5.3 08-26-18: wbc 5.9; hgb 12.9; hct 41.9; mcv 88.2 plt 222; glucose 123; bun 13; creat 0.81; k+ 3.5; na++ 140; ca 8.8 chol 248; ldl 177.9; trig 608; hdl 33 08-30-18: mag 1.8 08-31-18: wbc 7.0; hgb 12.0; hct 37.5; mcv 88.4; plt 191; glucose 109; bun 14; creat 0.64; k+ 3.9; na++ 139; ca 8.8   NO NEW LABS.    Review of Systems  Constitutional: Negative for malaise/fatigue.  Respiratory: Negative for cough and shortness of breath.   Cardiovascular: Negative for chest pain, palpitations and leg swelling.  Gastrointestinal: Negative for abdominal pain, constipation and heartburn.  Musculoskeletal: Positive for back pain. Negative for joint pain and myalgias.       Chronic   Skin: Negative.   Neurological: Positive for dizziness.       Chronic   Psychiatric/Behavioral: The patient is not nervous/anxious.     Physical Exam Constitutional:      General: She is not in acute distress.    Appearance: She is well-developed. She is not diaphoretic.  Neck:     Musculoskeletal: Neck supple.     Thyroid: No thyromegaly.  Cardiovascular:     Rate and Rhythm: Normal rate. Rhythm irregular.     Pulses: Normal pulses.     Heart sounds: Normal heart sounds.  Pulmonary:     Effort: Pulmonary effort is normal. No respiratory distress.     Breath sounds: Normal breath sounds.  Abdominal:     General: Bowel sounds are normal. There is no distension.     Palpations: Abdomen is soft.     Tenderness: There is no abdominal tenderness.  Musculoskeletal: Normal range of  motion.     Right lower leg: No edema.     Left  lower leg: No edema.  Lymphadenopathy:     Cervical: No cervical adenopathy.  Skin:    General: Skin is warm and dry.  Neurological:     Mental Status: She is alert and oriented to person, place, and time.  Psychiatric:        Mood and Affect: Mood normal.        ASSESSMENT/ PLAN:  TODAY;   1. Acute cva 2. Chronic back pain greater than 3 months.  3. Long standing atrial fibrillation  Will continue therapy as directed Her goal continues for her to go home She will not need any DME Will begin vicodin 7.5/325 mg three times daily as needed for chronic long term pain.   MD is aware of resident's narcotic use and is in agreement with current plan of care. We will attempt to wean resident as apropriate   Ok Edwards NP [facility]  Contact [phone] Monday through Friday 8am- 5pm  After hours call [phone]

## 2018-09-04 DIAGNOSIS — K219 Gastro-esophageal reflux disease without esophagitis: Secondary | ICD-10-CM | POA: Insufficient documentation

## 2018-09-04 DIAGNOSIS — R42 Dizziness and giddiness: Secondary | ICD-10-CM | POA: Insufficient documentation

## 2018-09-05 ENCOUNTER — Encounter (HOSPITAL_COMMUNITY)
Admission: RE | Admit: 2018-09-05 | Discharge: 2018-09-05 | Disposition: A | Discharge status: Home / Self Care | Payer: Medicare Other | Source: Skilled Nursing Facility | Attending: Adult Health | Admitting: Adult Health

## 2018-09-05 DIAGNOSIS — I10 Essential (primary) hypertension: Secondary | ICD-10-CM | POA: Insufficient documentation

## 2018-09-05 LAB — BASIC METABOLIC PANEL
Anion gap: 7 (ref 5–15)
BUN: 17 mg/dL (ref 8–23)
CO2: 27 mmol/L (ref 22–32)
Calcium: 8.3 mg/dL — ABNORMAL LOW (ref 8.9–10.3)
Chloride: 107 mmol/L (ref 98–111)
Creatinine, Ser: 0.56 mg/dL (ref 0.44–1.00)
GFR calc Af Amer: >60 mL/min (ref >60)
GFR calc non Af Amer: >60 mL/min (ref >60)
Glucose, Bld: 94 mg/dL (ref 70–99)
Potassium: 4.1 mmol/L (ref 3.5–5.1)
Sodium: 141 mmol/L (ref 135–145)

## 2018-09-05 LAB — MAGNESIUM: Magnesium: 1.8 mg/dL (ref 1.7–2.4)

## 2018-09-06 DIAGNOSIS — G8929 Other chronic pain: Secondary | ICD-10-CM | POA: Insufficient documentation

## 2018-09-07 ENCOUNTER — Non-Acute Institutional Stay (SKILLED_NURSING_FACILITY): Payer: Medicare Other | Admitting: Internal Medicine

## 2018-09-07 ENCOUNTER — Encounter: Payer: Self-pay | Admitting: Internal Medicine

## 2018-09-07 DIAGNOSIS — I639 Cerebral infarction, unspecified: Secondary | ICD-10-CM | POA: Diagnosis not present

## 2018-09-07 DIAGNOSIS — E782 Mixed hyperlipidemia: Secondary | ICD-10-CM | POA: Diagnosis not present

## 2018-09-07 DIAGNOSIS — I4811 Longstanding persistent atrial fibrillation: Secondary | ICD-10-CM | POA: Diagnosis not present

## 2018-09-07 DIAGNOSIS — R42 Dizziness and giddiness: Secondary | ICD-10-CM | POA: Diagnosis not present

## 2018-09-07 DIAGNOSIS — M069 Rheumatoid arthritis, unspecified: Secondary | ICD-10-CM | POA: Diagnosis not present

## 2018-09-07 DIAGNOSIS — E559 Vitamin D deficiency, unspecified: Secondary | ICD-10-CM

## 2018-09-07 NOTE — Progress Notes (Signed)
: Provider:  Hennie Duos., MD Location:  [place] Room Number: 160-P Place of Service:  SNF (31)  PCP: [name], MD Patient Care Team: [name], MD as PCP - General (Internal Medicine) Herminio Commons, MD as Attending Physician (Cardiology)  Extended Emergency Contact Information Primary Emergency Contact: Barco,Tom Address: [address]          [place], [place] [address] [place] Phone: [phone] Mobile Phone: [phone] Relation: Spouse Secondary Emergency Contact: Lawrence,Cathy Address: [address]          [place], [place] [address] [place] Phone: [phone] Mobile Phone: [phone] Relation: Daughter     Allergies: Iodinated diagnostic agents, Nerve block tray, Nitrofuran derivatives, Triamcinolone acetonide, and Vibra-tab [doxycycline]  Chief Complaint  Patient presents with  . New Admit To SNF    History and physical    HPI: Patient is an 85 y.o. female with chronic atrial fib, hypertension, hyperlipidemia, and rheumatoid arthritis who presented to ED after an episode of speech disturbance and altered mental status lasting 15 to 20 minutes.  Patient's son noted patient speech was slurred and patient was somewhat disoriented.  Patient complained of headache on the right side of the face in which was not new slight nausea and visual disturbance in the ED patient had telemetry neurology consult.  MRI brain showed acute subcentimeter infarct on the right MRA a of the head and neck without LVO Echo from 811 showed EF of 60 to 65%.  At baseline patient is wheelchair-bound due to previous back surgeries and rheumatoid arthritis.  Patient was admitted to [facility] from 8/13-19 with acute CVA of the right corpus callosum with expressive aphasia, resolving.  Patient received aspirin given recurrent episodes of expressive aphasia and left-sided deficit and the neurologist  advised continue aspirin for the next [redacted] weeks along with Eliquis in the setting of atrial fib..  All other problems continued at their baseline.  Patient is admitted to [facility] for OT/PT.  While at [facility] patient will be followed for vitamin D deficiency treated with replacement, rheumatoid arthritis treated with leflunomide and atrial fibrillation treated with metoprolol and Eliquis.  Past Medical History:  Diagnosis Date  . A-fib ([place])   . Atrial fibrillation ([place])   . Cervicalgia   . Colon cancer ([place])   . Degeneration of cervical intervertebral disc   . Hypertension   . Pinched nerve in shoulder   . Rheumatoid arthritis(714.0)   . RUQ pain   . Vulvar dystrophy   . Yeast infection     Past Surgical History:  Procedure Laterality Date  . ABDOMINAL HYSTERECTOMY  1960'S  . APPENDECTOMY  with hysterectomy  . BACK SURGERY  12/08/10   plate/screws C3 C4  . CHOLECYSTECTOMY  2002  . COLONOSCOPY  04/02/2009  . COLONOSCOPY  06/04/05  . COLONOSCOPY N/A 07/26/2015   Procedure: COLONOSCOPY;  Surgeon: Rogene Houston, MD;  [facility];  Service: Endoscopy;  Laterality: N/A;  1240  . COLONOSCOPY WITH ESOPHAGOGASTRODUODENOSCOPY (EGD) N/A 04/08/2012   Procedure: COLONOSCOPY WITH ESOPHAGOGASTRODUODENOSCOPY (EGD);  Surgeon: Rogene Houston, MD;  [facility];  Service: Endoscopy;  Laterality: N/A;  130-moved to 245 Ann notified pt  . ESOPHAGOGASTRODUODENOSCOPY N/A 07/26/2015   Procedure: ESOPHAGOGASTRODUODENOSCOPY (EGD);  Surgeon: Rogene Houston, MD;  [facility];  Service: Endoscopy;  Laterality: N/A;  . HEMICOLECTOMY  10/05    Allergies as of 09/07/2018  Reactions   Iodinated Diagnostic Agents Anaphylaxis   08-12-10 Pt had severe reaction with itching/hives/loss of consciousness within 5 minutes of CEPI today.  She received both Kenalog and Iondinated Contrast, so not certain which agent caused reaction.  If patient returns for another CEPI, Dr.  Jobe Igo considers using Decadron and a 13-hour prep.  jkl   Nerve Block Tray Anaphylaxis   Patient states that she was sent to have Epidural for her shoulder. After rec'ving she remembers saying that she was itching all over  And that's it. She was transported to hospital  By EMS. Stayed overnight.   Nitrofuran Derivatives Hives, Nausea And Vomiting   Triamcinolone Acetonide Anaphylaxis   08-12-10  Pt had severe reaction with itching/hives/loss of consciousness within five minutes of CEPI today.  She received both Kenalog and Iodinated Contrast, so not certain which agent caused reaction.  If pt returns for another cervical epidural steroid injection, Dr. Jobe Igo suggests using Decadron and a 13-hr prep.  JKL PATIENT USES PROCTOCREAM 2.5% WITHOUT DIFFICULTY FOR VULVAR DYSTROPHY Patient says she can take prednisone without any problem   Vibra-tab [doxycycline] Nausea And Vomiting      Medication List    Notice   This visit is during an admission. Changes to the med list made in this visit will be reflected in the After Visit Summary of the admission.     No orders of the defined types were placed in this encounter.   Immunization History  Administered Date(s) Administered  . Influenza-Unspecified 10/12/2012, 10/06/2017  . PPD Test 08/31/2018  . Pneumococcal Conjugate-13 06/19/2014    Social History   Tobacco Use  . Smoking status: Never Smoker  . Smokeless tobacco: Never Used  Substance Use Topics  . Alcohol use: No    Family history is   Family History  Problem Relation Age of Onset  . Rheum arthritis Mother   . Parkinsonism Father   . Hiatal hernia Sister   . Healthy Brother   . Healthy Daughter   . Healthy Son   . Healthy Son       Review of Systems  DATA OBTAINED: from patient, nurse GENERAL:  no fevers, fatigue, appetite changes SKIN: No itching, or rash EYES: No eye pain, redness, discharge EARS: No earache, tinnitus, change in hearing NOSE: No  congestion, drainage or bleeding  MOUTH/THROAT: No mouth or tooth pain, No sore throat RESPIRATORY: No cough, wheezing, SOB CARDIAC: No chest pain, palpitations, lower extremity edema  GI: No abdominal pain, No N/V/D or constipation, No heartburn or reflux  GU: No dysuria, frequency or urgency, or incontinence  MUSCULOSKELETAL: No unrelieved bone/joint pain NEUROLOGIC: No headache, dizziness or focal weakness PSYCHIATRIC: No c/o anxiety or sadness   Vitals:   09/07/18 0859  BP: 103/60  Pulse: 97  Resp: 18  Temp: (!) 97.2 F (36.2 C)    SpO2 Readings from Last 1 Encounters:  08/31/18 96%   Body mass index is 21.07 kg/m.     Physical Exam  GENERAL APPEARANCE: Alert, conversant,  No acute distress.  SKIN: No diaphoresis rash HEAD: Normocephalic, atraumatic  EYES: Conjunctiva/lids clear. Pupils round, reactive. EOMs intact.  EARS: External exam WNL, canals clear. Hearing grossly normal.  NOSE: No deformity or discharge.  MOUTH/THROAT: Lips w/o lesions  RESPIRATORY: Breathing is even, unlabored. Lung sounds are clear   CARDIOVASCULAR: Heart RRR no murmurs, rubs or gallops. No peripheral edema.   GASTROINTESTINAL: Abdomen is soft, non-tender, not distended w/ normal bowel sounds. GENITOURINARY: Bladder non  tender, not distended  MUSCULOSKELETAL: No abnormal joints or musculature NEUROLOGIC:  Cranial nerves 2-12 grossly intact. Moves all extremities; no discernible aphasia or weakness except the left hand PSYCHIATRIC: Mood and affect appropriate to situation, no behavioral issues  Patient Active Problem List   Diagnosis Date Noted  . Chronic back pain greater than 3 months duration 09/06/2018  . GERD without esophagitis 09/04/2018  . Chronic vertigo 09/04/2018  . Hypomagnesemia 09/04/2018  . Rheumatoid arthritis ([place])   . TIA (transient ischemic attack) 08/25/2018  . CVA (cerebral vascular accident) Acute subcentimeter infarction in the right Corpus Callosum 08/25/2018   . Chronic anticoagulation/Eliquis for Afib 08/25/2018  . Acute CVA (cerebrovascular accident) ([place]) 08/25/2018  . CAP (community acquired pneumonia) 02/24/2017  . Acute respiratory failure with hypoxia ([place]) 02/23/2017  . Bronchitis 02/22/2017  . Hypoxia 02/22/2017  . Generalized weakness 02/22/2017  . RAD (reactive airway disease) with wheezing, mild intermittent, with acute exacerbation 02/22/2017  . Chronic pain 02/22/2017  . Vulvar dystrophy 05/23/2014  . Atrial fibrillation ([place]) 08/10/2013  . Essential hypertension 07/04/2013  . Tachycardia 01/31/2013  . Dystrophy, vulva 05/04/2012  . Abdominal pain, epigastric 03/28/2012  . Dyspepsia 04/14/2011  . History of colon cancer 03/17/2011  . Diarrhea 03/17/2011  . HLD (hyperlipidemia) 11/30/2006  . BRONCHIECTASIS 11/30/2006  . RHEUMATOID ARTHRITIS 11/30/2006      Labs reviewed: Basic Metabolic Panel:    Component Value Date/Time   NA 141 09/05/2018 0738   K 4.1 09/05/2018 0738   CL 107 09/05/2018 0738   CO2 27 09/05/2018 0738   GLUCOSE 94 09/05/2018 0738   BUN 17 09/05/2018 0738   CREATININE 0.56 09/05/2018 0738   CREATININE 0.65 08/10/2013 1407   CALCIUM 8.3 (L) 09/05/2018 0738   PROT 6.9 08/25/2018 1237   ALBUMIN 3.7 08/25/2018 1237   AST 22 08/25/2018 1237   ALT 13 08/25/2018 1237   ALKPHOS 60 08/25/2018 1237   BILITOT 0.6 08/25/2018 1237   GFRNONAA >60 09/05/2018 0738   GFRNONAA 85 08/10/2013 1407   GFRAA >60 09/05/2018 0738   GFRAA >89 08/10/2013 1407    Recent Labs    08/26/18 0545 08/30/18 0839 09/05/18 0738  NA 140 139 141  K 3.5 3.9 4.1  CL 103 104 107  CO2 25 25 27   GLUCOSE 123* 109* 94  BUN 13 14 17   CREATININE 0.81 0.64 0.56  CALCIUM 8.8* 8.8* 8.3*  MG  --  1.6* 1.8   Liver Function Tests: Recent Labs    08/25/18 1237  AST 22  ALT 13  ALKPHOS 60  BILITOT 0.6  PROT 6.9  ALBUMIN 3.7   No results for input(s): LIPASE, AMYLASE in the last 8760 hours. No results for input(s):  AMMONIA in the last 8760 hours. CBC: Recent Labs    08/26/18 0545 08/29/18 0452 08/31/18 0507  WBC 5.9 6.1 7.0  HGB 12.9 11.6* 12.0  HCT 41.9 37.3 37.2  MCV 88.2 88.6 88.4  PLT 222 191 191   Lipid Recent Labs    08/26/18 0545  CHOL 248*  HDL 33*  LDLCALC UNABLE TO CALCULATE IF TRIGLYCERIDE OVER 400 mg/dL  TRIG 608*    Cardiac Enzymes: No results for input(s): CKTOTAL, CKMB, CKMBINDEX, TROPONINI in the last 8760 hours. BNP: Recent Labs    08/22/18 1013  BNP 298.0*   No results found for: [facility] Lab Results  Component Value Date   HGBA1C 5.3 08/25/2018   Lab Results  Component Value Date   TSH 0.877 08/25/2018  No results found for: VITAMINB12 No results found for: FOLATE No results found for: IRON, TIBC, FERRITIN  Imaging and Procedures obtained prior to SNF admission: No results found.   Not all labs, radiology exams or other studies done during hospitalization come through on my EPIC note; however they are reviewed by me.    Assessment and Plan  Acute CVA of right corpus callosum-with expressive aphasia that is resolved on discharge; patient received aspirin and given recurrent episodes of expressive aphasia and left-sided deficits neurology advised continuing ASA 81 mg for [redacted] weeks along with Eliquis 2.5 twice daily daily in the setting of atrial fib with follow-up with neurology.  MRI of the brain showed acute subcentimeter infarct in the right; MRI neck without lesions; echo with preserved EF of 60 to 65%; direct LDL 717 and triglycerides are 608 with total cholesterol 248; Crestor was started; A1c 5.3 a TSH 0.87; given ongoing symptoms of lightheadedness and dizziness patient was started on meclizine 12.5 mg twice daily SNF- admitted for OT/PT; continue aspirin 81 mg daily for 2 weeks and Eliquis 2.5 mg twice daily indefinitely along with Crestor 10 mg daily and meclizine 12.5 mg twice daily for dizziness  Chronic atrial fibrillation SNF- continue  Eliquis 12.5 mg twice daily for secondary stroke prevention and metoprolol XL 50 mg daily-patient was discharged with 75 mg but patient's blood pressure would not tolerate  Rheumatoid arthritis SNF--continue leflunomide 20 mg daily; at baseline patient can walk short distances in her house so therapy will be working her back to her baseline  Vitamin D deficiency SNF- continue 2000 units twice daily  Hyperlipidemia SNF-continue Crestor 10 mg daily   Time spent greater than 45 minutes;> 50% of time with patient was spent reviewing records, labs, tests and studies, counseling and developing plan of care  Hennie Duos, MD

## 2018-09-08 ENCOUNTER — Encounter: Payer: Self-pay | Admitting: Adult Health

## 2018-09-08 ENCOUNTER — Other Ambulatory Visit: Payer: Self-pay | Admitting: Adult Health

## 2018-09-08 ENCOUNTER — Non-Acute Institutional Stay (SKILLED_NURSING_FACILITY): Payer: Medicare Other | Admitting: Adult Health

## 2018-09-08 DIAGNOSIS — M549 Dorsalgia, unspecified: Secondary | ICD-10-CM | POA: Diagnosis not present

## 2018-09-08 DIAGNOSIS — I639 Cerebral infarction, unspecified: Secondary | ICD-10-CM

## 2018-09-08 DIAGNOSIS — M069 Rheumatoid arthritis, unspecified: Secondary | ICD-10-CM

## 2018-09-08 DIAGNOSIS — G8929 Other chronic pain: Secondary | ICD-10-CM

## 2018-09-08 MED ORDER — MECLIZINE HCL 12.5 MG PO TABS: 12.5000 mg | ORAL_TABLET | Freq: Two times a day (BID) | ORAL | 0 refills | Status: DC | PRN

## 2018-09-08 MED ORDER — HYDROCODONE-ACETAMINOPHEN 7.5-325 MG PO TABS: 1.0000 | ORAL_TABLET | Freq: Three times a day (TID) | ORAL | 0 refills | Status: DC | PRN

## 2018-09-08 MED ORDER — METOPROLOL SUCCINATE ER 25 MG PO TB24: 75.0000 mg | ORAL_TABLET | Freq: Every day | ORAL | 0 refills | Status: DC

## 2018-09-08 MED ORDER — ROSUVASTATIN CALCIUM 20 MG PO TABS: 20.0000 mg | ORAL_TABLET | Freq: Every day | ORAL | 0 refills | Status: DC

## 2018-09-08 MED ORDER — LEFLUNOMIDE 20 MG PO TABS: 20.0000 mg | ORAL_TABLET | Freq: Every day | ORAL | 0 refills | Status: DC

## 2018-09-08 MED ORDER — APIXABAN 2.5 MG PO TABS: 2.5000 mg | ORAL_TABLET | Freq: Two times a day (BID) | ORAL | 0 refills | Status: DC

## 2018-09-08 NOTE — Progress Notes (Signed)
Location:  [facility] Room Number: 160-P Place of Service:  SNF (31)    CODE STATUS: DNR  Allergies  Allergen Reactions  . Iodinated Diagnostic Agents Anaphylaxis    08-12-10 Pt had severe reaction with itching/hives/loss of consciousness within 5 minutes of CEPI today.  She received both Kenalog and Iondinated Contrast, so not certain which agent caused reaction.  If patient returns for another CEPI, Dr. Jobe Igo considers using Decadron and a 13-hour prep.  jkl  . Nerve Block Tray Anaphylaxis    Patient states that she was sent to have Epidural for her shoulder. After rec'ving she remembers saying that she was itching all over  And that's it. She was transported to hospital  By EMS. Stayed overnight.  [place] Spikes Derivatives Hives and Nausea And Vomiting  . Triamcinolone Acetonide Anaphylaxis    08-12-10  Pt had severe reaction with itching/hives/loss of consciousness within five minutes of CEPI today.  She received both Kenalog and Iodinated Contrast, so not certain which agent caused reaction.  If pt returns for another cervical epidural steroid injection, Dr. Jobe Igo suggests using Decadron and a 13-hr prep.  JKL PATIENT USES PROCTOCREAM 2.5% WITHOUT DIFFICULTY FOR VULVAR DYSTROPHY Patient says she can take prednisone without any problem  . Vibra-Tab [Doxycycline] Nausea And Vomiting    Chief Complaint  Patient presents with  . Discharge Note    Patient seen for discharge home on 09/10/18    HPI:   She is being discharged to home with home health for pt/ot. She will need her prescriptions written and will need to follow up with her medical provider. She had been hospitalized for an acute cva. Initially she did have some expressive aphasia; but this has resolved. She was admitted to this facility for short tern rehab and is now ready to complete her therapy on a home health basis.    Past Medical History:  Diagnosis Date  . A-fib ([place])   . Atrial  fibrillation ([place])   . Cervicalgia   . Colon cancer ([place])   . Degeneration of cervical intervertebral disc   . Hypertension   . Pinched nerve in shoulder   . Rheumatoid arthritis(714.0)   . RUQ pain   . Vulvar dystrophy   . Yeast infection     Past Surgical History:  Procedure Laterality Date  . ABDOMINAL HYSTERECTOMY  1960'S  . APPENDECTOMY  with hysterectomy  . BACK SURGERY  12/08/10   plate/screws C3 C4  . CHOLECYSTECTOMY  2002  . COLONOSCOPY  04/02/2009  . COLONOSCOPY  06/04/05  . COLONOSCOPY N/A 07/26/2015   Procedure: COLONOSCOPY;  Surgeon: Rogene Houston, MD;  [facility];  Service: Endoscopy;  Laterality: N/A;  1240  . COLONOSCOPY WITH ESOPHAGOGASTRODUODENOSCOPY (EGD) N/A 04/08/2012   Procedure: COLONOSCOPY WITH ESOPHAGOGASTRODUODENOSCOPY (EGD);  Surgeon: Rogene Houston, MD;  [facility];  Service: Endoscopy;  Laterality: N/A;  130-moved to 245 Ann notified pt  . ESOPHAGOGASTRODUODENOSCOPY N/A 07/26/2015   Procedure: ESOPHAGOGASTRODUODENOSCOPY (EGD);  Surgeon: Rogene Houston, MD;  [facility];  Service: Endoscopy;  Laterality: N/A;  . HEMICOLECTOMY  10/05    Social History   Socioeconomic History  . Marital status: Married    Spouse name: Not on file  . Number of children: Not on file  . Years of education: Not on file  . Highest education level: Not on file  Occupational History  . Not on file  Social Needs  . Emergency planning/management officer  strain: Not on file  . Food insecurity    Worry: Not on file    Inability: Not on file  . Transportation needs    Medical: Not on file    Non-medical: Not on file  Tobacco Use  . Smoking status: Never Smoker  . Smokeless tobacco: Never Used  Substance and Sexual Activity  . Alcohol use: No  . Drug use: No  . Sexual activity: Never  Lifestyle  . Physical activity    Days per week: Not on file    Minutes per session: Not on file  . Stress: Not on file  Relationships  . Social Product manager on phone: Not on file    Gets together: Not on file    Attends religious service: Not on file    Active member of club or organization: Not on file    Attends meetings of clubs or organizations: Not on file    Relationship status: Not on file  . Intimate partner violence    Fear of current or ex partner: Not on file    Emotionally abused: Not on file    Physically abused: Not on file    Forced sexual activity: Not on file  Other Topics Concern  . Not on file  Social History Narrative   Nonsmoker.  She lives alone.  Her son calls her every day to check on her.  Has a daughter involved in care as well.     Family History  Problem Relation Age of Onset  . Rheum arthritis Mother   . Parkinsonism Father   . Hiatal hernia Sister   . Healthy Brother   . Healthy Daughter   . Healthy Son   . Healthy Son     VITAL SIGNS BP 119/74   Pulse 89   Temp (!) 97 F (36.1 C) (Oral)   Resp 20   Ht 5\' 2"  (1.575 m)   Wt 115 lb 3.2 oz (52.3 kg)   BMI 21.07 kg/m   Patient's Medications  New Prescriptions   LEFLUNOMIDE (ARAVA) 20 MG TABLET    Take 1 tablet (20 mg total) by mouth daily.  Previous Medications   ACETAMINOPHEN (TYLENOL) 325 MG TABLET    Take 2 tablets (650 mg total) by mouth every 6 (six) hours as needed for mild pain or headache (or Fever >/= 101).   ASPIRIN 81 MG CHEWABLE TABLET    Chew 1 tablet (81 mg total) by mouth daily.   CHOLECALCIFEROL (VITAMIN D3) 25 MCG (1000 UT) TABLET    Take 2,000 Units by mouth 2 (two) times daily. 2 tablets to = 2000 units   NON FORMULARY    Diet Type: Regular,  NAS Consistent Carbohydrate   OMEPRAZOLE (PRILOSEC) 20 MG CAPSULE    TAKE ONE CAPSULE BY MOUTH TWICE DAILY.  Modified Medications   Modified Medication Previous Medication   APIXABAN (ELIQUIS) 2.5 MG TABS TABLET apixaban (ELIQUIS) 2.5 MG TABS tablet      Take 1 tablet (2.5 mg total) by mouth 2 (two) times daily.    Take 2.5 mg by mouth 2 (two) times daily.    HYDROCODONE-ACETAMINOPHEN (NORCO) 7.5-325 MG TABLET HYDROcodone-acetaminophen (NORCO) 7.5-325 MG tablet      Take 1 tablet by mouth 3 (three) times daily as needed for moderate pain.    Take 1 tablet by mouth 3 (three) times daily as needed for moderate pain.   MECLIZINE (ANTIVERT) 12.5 MG TABLET meclizine (ANTIVERT) 12.5 MG tablet  Take 1 tablet (12.5 mg total) by mouth 2 (two) times daily as needed for dizziness.    Take 1 tablet (12.5 mg total) by mouth 2 (two) times daily.   METOPROLOL SUCCINATE (TOPROL-XL) 25 MG 24 HR TABLET metoprolol succinate (TOPROL-XL) 25 MG 24 hr tablet      Take 3 tablets (75 mg total) by mouth daily. Take with or immediately following a meal.    Take 3 tablets (75 mg total) by mouth daily. Take with or immediately following a meal.   ROSUVASTATIN (CRESTOR) 20 MG TABLET rosuvastatin (CRESTOR) 20 MG tablet      Take 1 tablet (20 mg total) by mouth daily at 6 PM.    Take 1 tablet (20 mg total) by mouth daily at 6 PM.  Discontinued Medications   No medications on file     SIGNIFICANT DIAGNOSTIC EXAMS   PREVIOUS:   08-23-18: 2-d echo:   1. The left ventricle has normal systolic function with an ejection fraction of 60-65%. The cavity size was normal. Left ventricular diastolic Doppler parameters are indeterminate.  2. The right ventricle has normal systolic function. The cavity was normal. There is no increase in right ventricular wall thickness.  3. Left atrial size was mildly dilated.  4. The aortic valve has an indeterminate number of cusps. Mild thickening of the aortic valve. Mild calcification of the aortic valve. Aortic valve regurgitation is mild to moderate by color flow Doppler. Mild aortic annular calcification noted.  5. The mitral valve is abnormal. Mild thickening of the mitral valve leaflet. Mild calcification of the mitral valve leaflet. There is mild mitral annular calcification present. No evidence of mitral valve stenosis.  6. The aorta is normal  in size and structure.  7. Pulmonary hypertension is indeterminant, inadequate TR jet.  08-25-18: chest x-ray: 1. No acute chest finding. 2.  Aortic Atherosclerosis   08-25-18: ct of head: Mild atrophy with patchy periventricular small vessel disease. No acute infarct. No mass or hemorrhage. There are foci of arterial vascular calcification. There are foci of relatively mild paranasal sinus disease  08-25-18: MRI of brain MRA of neck and head:  Background pattern of chronic small vessel ischemic changes throughout the brain. Acute subcentimeter infarction in the right splenium of the corpus callosum which could be symptomatic. No sign of hemorrhage or mass effect.      Atherosclerotic change of the carotid bifurcations. 20% stenosis of the proximal ICA on the right. No stenosis on the left. Negative intracranial MR angiography. No large or medium vessel Occlusion.  08-29-18: This is a normal recording of awake and drowsy states.    NO NEW EXAMS.    LABS REVIEWED PREVIOUS;   08-25-18: wbc 5.5; hgb 12.7; hct 40.4; mcv 88.0; plt 195; glucose 101; bun 11; creat 0.63; k+ 4.0; na++ 140; ca 8.6; liver normal albumin 3.7 tsh 0.877 hgb a1c 5.3 08-26-18: wbc 5.9; hgb 12.9; hct 41.9; mcv 88.2 plt 222; glucose 123; bun 13; creat 0.81; k+ 3.5; na++ 140; ca 8.8 chol 248; ldl 177.9; trig 608; hdl 33 08-30-18: mag 1.8 08-31-18: wbc 7.0; hgb 12.0; hct 37.5; mcv 88.4; plt 191; glucose 109; bun 14; creat 0.64; k+ 3.9; na++ 139; ca 8.8   NO NEW LABS.    Review of Systems  Constitutional: Negative for malaise/fatigue.  Respiratory: Negative for cough and shortness of breath.   Cardiovascular: Negative for chest pain, palpitations and leg swelling.  Gastrointestinal: Negative for abdominal pain, constipation and heartburn.  Musculoskeletal: Positive  for back pain. Negative for joint pain and myalgias.       Chronic   Skin: Negative.   Neurological: Positive for dizziness.       Chronic    Psychiatric/Behavioral: The patient is not nervous/anxious.     Physical Exam Constitutional:      General: She is not in acute distress.    Appearance: She is well-developed. She is not diaphoretic.  Neck:     Musculoskeletal: Neck supple.     Thyroid: No thyromegaly.  Cardiovascular:     Rate and Rhythm: Normal rate. Rhythm irregular.     Pulses: Normal pulses.     Heart sounds: Normal heart sounds.  Pulmonary:     Effort: Pulmonary effort is normal. No respiratory distress.     Breath sounds: Normal breath sounds.  Abdominal:     General: Bowel sounds are normal. There is no distension.     Palpations: Abdomen is soft.     Tenderness: There is no abdominal tenderness.  Musculoskeletal: Normal range of motion.     Right lower leg: No edema.     Left lower leg: No edema.  Lymphadenopathy:     Cervical: No cervical adenopathy.  Skin:    General: Skin is warm and dry.  Neurological:     Mental Status: She is alert and oriented to person, place, and time.  Psychiatric:        Mood and Affect: Mood normal.       ASSESSMENT/ PLAN:  Patient is being discharged with the following home health services: pt/ot: to evaluate and treat as indicated for gait balance strength adl training.     Patient is being discharged with the following durable medical equipment:  None needed   Patient has been advised to f/u with their PCP in 1-2 weeks to bring them up to date on their rehab stay.  Social services at facility was responsible for arranging this appointment.  Pt was provided with a 30 day supply of prescriptions for medications and refills must be obtained from their PCP.  For controlled substances, a more limited supply may be provided adequate until PCP appointment only.   A 30 day supply of prescription medications with #15 vicodin 7.5/325 mg tabs; to [facility] [address]   Time spent with patient 35 minutes: dme; medications home health.    Ok Edwards NP [facility]  Contact [phone] Monday through Friday 8am- 5pm  After hours call [phone]

## 2018-09-12 DIAGNOSIS — M069 Rheumatoid arthritis, unspecified: Secondary | ICD-10-CM | POA: Diagnosis not present

## 2018-09-12 DIAGNOSIS — M6281 Muscle weakness (generalized): Secondary | ICD-10-CM | POA: Diagnosis not present

## 2018-09-12 DIAGNOSIS — I69398 Other sequelae of cerebral infarction: Secondary | ICD-10-CM | POA: Diagnosis not present

## 2018-09-12 DIAGNOSIS — I10 Essential (primary) hypertension: Secondary | ICD-10-CM | POA: Diagnosis not present

## 2018-09-12 DIAGNOSIS — Z993 Dependence on wheelchair: Secondary | ICD-10-CM | POA: Diagnosis not present

## 2018-09-12 DIAGNOSIS — Z7901 Long term (current) use of anticoagulants: Secondary | ICD-10-CM | POA: Diagnosis not present

## 2018-09-12 DIAGNOSIS — M5090 Cervical disc disorder, unspecified, unspecified cervical region: Secondary | ICD-10-CM | POA: Diagnosis not present

## 2018-09-12 DIAGNOSIS — Z7982 Long term (current) use of aspirin: Secondary | ICD-10-CM | POA: Diagnosis not present

## 2018-09-12 DIAGNOSIS — E785 Hyperlipidemia, unspecified: Secondary | ICD-10-CM | POA: Diagnosis not present

## 2018-09-12 DIAGNOSIS — M5410 Radiculopathy, site unspecified: Secondary | ICD-10-CM | POA: Diagnosis not present

## 2018-09-12 DIAGNOSIS — G8929 Other chronic pain: Secondary | ICD-10-CM | POA: Diagnosis not present

## 2018-09-12 DIAGNOSIS — I482 Chronic atrial fibrillation, unspecified: Secondary | ICD-10-CM | POA: Diagnosis not present

## 2018-09-14 DIAGNOSIS — I639 Cerebral infarction, unspecified: Secondary | ICD-10-CM | POA: Diagnosis not present

## 2018-09-14 DIAGNOSIS — Z6823 Body mass index (BMI) 23.0-23.9, adult: Secondary | ICD-10-CM | POA: Diagnosis not present

## 2018-09-14 DIAGNOSIS — I10 Essential (primary) hypertension: Secondary | ICD-10-CM | POA: Diagnosis not present

## 2018-09-14 DIAGNOSIS — I4891 Unspecified atrial fibrillation: Secondary | ICD-10-CM | POA: Diagnosis not present

## 2018-09-14 DIAGNOSIS — M069 Rheumatoid arthritis, unspecified: Secondary | ICD-10-CM | POA: Diagnosis not present

## 2018-09-18 ENCOUNTER — Encounter: Payer: Self-pay | Admitting: Internal Medicine

## 2018-09-18 DIAGNOSIS — E559 Vitamin D deficiency, unspecified: Secondary | ICD-10-CM | POA: Insufficient documentation

## 2018-10-09 ENCOUNTER — Other Ambulatory Visit: Payer: Self-pay | Admitting: Adult Health

## 2018-10-11 DIAGNOSIS — M0589 Other rheumatoid arthritis with rheumatoid factor of multiple sites: Secondary | ICD-10-CM | POA: Diagnosis not present

## 2018-10-11 DIAGNOSIS — G894 Chronic pain syndrome: Secondary | ICD-10-CM | POA: Diagnosis not present

## 2018-10-12 DIAGNOSIS — E782 Mixed hyperlipidemia: Secondary | ICD-10-CM | POA: Diagnosis not present

## 2018-10-12 DIAGNOSIS — I4891 Unspecified atrial fibrillation: Secondary | ICD-10-CM | POA: Diagnosis not present

## 2018-10-12 DIAGNOSIS — M1991 Primary osteoarthritis, unspecified site: Secondary | ICD-10-CM | POA: Diagnosis not present

## 2018-10-12 DIAGNOSIS — I10 Essential (primary) hypertension: Secondary | ICD-10-CM | POA: Diagnosis not present

## 2018-10-19 DIAGNOSIS — G8929 Other chronic pain: Secondary | ICD-10-CM | POA: Diagnosis not present

## 2018-10-19 DIAGNOSIS — I69398 Other sequelae of cerebral infarction: Secondary | ICD-10-CM | POA: Diagnosis not present

## 2018-10-19 DIAGNOSIS — M6281 Muscle weakness (generalized): Secondary | ICD-10-CM | POA: Diagnosis not present

## 2018-10-19 DIAGNOSIS — I10 Essential (primary) hypertension: Secondary | ICD-10-CM | POA: Diagnosis not present

## 2018-10-20 DIAGNOSIS — H524 Presbyopia: Secondary | ICD-10-CM | POA: Diagnosis not present

## 2018-10-24 ENCOUNTER — Telehealth: Payer: Self-pay | Admitting: Cardiovascular Disease

## 2018-10-24 ENCOUNTER — Ambulatory Visit (INDEPENDENT_AMBULATORY_CARE_PROVIDER_SITE_OTHER): Payer: Medicare Other | Admitting: Cardiovascular Disease

## 2018-10-24 ENCOUNTER — Encounter: Payer: Self-pay | Admitting: *Deleted

## 2018-10-24 ENCOUNTER — Other Ambulatory Visit: Payer: Self-pay

## 2018-10-24 ENCOUNTER — Encounter: Payer: Self-pay | Admitting: Cardiovascular Disease

## 2018-10-24 VITALS — BP 135/76 | HR 70 | Ht 62.0 in | Wt 120.2 lb

## 2018-10-24 DIAGNOSIS — R0602 Shortness of breath: Secondary | ICD-10-CM

## 2018-10-24 DIAGNOSIS — I480 Paroxysmal atrial fibrillation: Secondary | ICD-10-CM | POA: Diagnosis not present

## 2018-10-24 DIAGNOSIS — Z01812 Encounter for preprocedural laboratory examination: Secondary | ICD-10-CM

## 2018-10-24 DIAGNOSIS — I351 Nonrheumatic aortic (valve) insufficiency: Secondary | ICD-10-CM | POA: Diagnosis not present

## 2018-10-24 DIAGNOSIS — I208 Other forms of angina pectoris: Secondary | ICD-10-CM

## 2018-10-24 DIAGNOSIS — I639 Cerebral infarction, unspecified: Secondary | ICD-10-CM | POA: Diagnosis not present

## 2018-10-24 DIAGNOSIS — I10 Essential (primary) hypertension: Secondary | ICD-10-CM

## 2018-10-24 NOTE — Telephone Encounter (Signed)
°  Precert needed for: lexiscan- angina pectoris   Location: [facility]    Date: Oct 28, 2018

## 2018-10-24 NOTE — Progress Notes (Addendum)
SUBJECTIVE: The patient presents for posthospitalization follow-up.  She was hospitalized in August for an acute CVA of the right corpus callosum.  He was advised she continue taking aspirin and low-dose Eliquis 2.5 mg twice daily.  I personally reviewed the echocardiogram performed on 08/23/2018 which demonstrated normal LV systolic function, EF 60 to 65%.  There was mild to moderate aortic regurgitation.  She has a history of paroxysmal atrial fibrillation.  I personally reviewed the ECG performed on 08/25/2018 which demonstrated sinus rhythm.  She is here with her son, [name], from [place].  She seldom has palpitations.  She continues to complain of exertional dyspnea when going approximately 20 feet with her walker.  Chest x-ray on 08/25/2018 showed chronic elevation of the right hemidiaphragm with mild chronic interstitial coarsening which was unchanged.  There was no consolidation, pleural effusion, or pneumothorax.  BNP was mildly elevated at 298 on 08/22/2018.    Review of Systems: As per "subjective", otherwise negative.  Allergies  Allergen Reactions  . Iodinated Diagnostic Agents Anaphylaxis    08-12-10 Pt had severe reaction with itching/hives/loss of consciousness within 5 minutes of CEPI today.  She received both Kenalog and Iondinated Contrast, so not certain which agent caused reaction.  If patient returns for another CEPI, Dr. Jobe Igo considers using Decadron and a 13-hour prep.  jkl  . Nerve Block Tray Anaphylaxis    Patient states that she was sent to have Epidural for her shoulder. After rec'ving she remembers saying that she was itching all over  And that's it. She was transported to hospital  By EMS. Stayed overnight.  Bufford Spikes Derivatives Hives and Nausea And Vomiting  . Triamcinolone Acetonide Anaphylaxis    08-12-10  Pt had severe reaction with itching/hives/loss of consciousness within five minutes of CEPI today.  She received both Kenalog and  Iodinated Contrast, so not certain which agent caused reaction.  If pt returns for another cervical epidural steroid injection, Dr. Jobe Igo suggests using Decadron and a 13-hr prep.  JKL PATIENT USES PROCTOCREAM 2.5% WITHOUT DIFFICULTY FOR VULVAR DYSTROPHY Patient says she can take prednisone without any problem  . Vibra-Tab [Doxycycline] Nausea And Vomiting    Current Outpatient Medications  Medication Sig Dispense Refill  . acetaminophen (TYLENOL) 325 MG tablet Take 2 tablets (650 mg total) by mouth every 6 (six) hours as needed for mild pain or headache (or Fever >/= 101).    Marland Kitchen apixaban (ELIQUIS) 2.5 MG TABS tablet Take 1 tablet (2.5 mg total) by mouth 2 (two) times daily. 60 tablet 0  . aspirin 81 MG chewable tablet Chew 1 tablet (81 mg total) by mouth daily.    . cholecalciferol (VITAMIN D3) 25 MCG (1000 UT) tablet Take 2,000 Units by mouth 2 (two) times daily. 2 tablets to = 2000 units    . HYDROcodone-acetaminophen (NORCO) 7.5-325 MG tablet Take 1 tablet by mouth 3 (three) times daily as needed for moderate pain. 15 tablet 0  . leflunomide (ARAVA) 20 MG tablet Take 1 tablet (20 mg total) by mouth daily. 30 tablet 0  . meclizine (ANTIVERT) 12.5 MG tablet Take 1 tablet (12.5 mg total) by mouth 2 (two) times daily as needed for dizziness. 30 tablet 0  . metoprolol succinate (TOPROL-XL) 25 MG 24 hr tablet Take 3 tablets (75 mg total) by mouth daily. Take with or immediately following a meal. 90 tablet 0  . NON FORMULARY Diet Type: Regular,  NAS Consistent Carbohydrate    . omeprazole (PRILOSEC)  20 MG capsule TAKE ONE CAPSULE BY MOUTH TWICE DAILY. 60 capsule 11  . rosuvastatin (CRESTOR) 20 MG tablet Take 1 tablet (20 mg total) by mouth daily at 6 PM. 30 tablet 0   No current facility-administered medications for this visit.     Past Medical History:  Diagnosis Date  . A-fib ([place])   . Atrial fibrillation ([place])   . Cervicalgia   . Colon cancer ([place])   . Degeneration of cervical  intervertebral disc   . Hypertension   . Pinched nerve in shoulder   . Rheumatoid arthritis(714.0)   . RUQ pain   . Vulvar dystrophy   . Yeast infection     Past Surgical History:  Procedure Laterality Date  . ABDOMINAL HYSTERECTOMY  1960'S  . APPENDECTOMY  with hysterectomy  . BACK SURGERY  12/08/10   plate/screws C3 C4  . CHOLECYSTECTOMY  2002  . COLONOSCOPY  04/02/2009  . COLONOSCOPY  06/04/05  . COLONOSCOPY N/A 07/26/2015   Procedure: COLONOSCOPY;  Surgeon: Rogene Houston, MD;  [facility];  Service: Endoscopy;  Laterality: N/A;  1240  . COLONOSCOPY WITH ESOPHAGOGASTRODUODENOSCOPY (EGD) N/A 04/08/2012   Procedure: COLONOSCOPY WITH ESOPHAGOGASTRODUODENOSCOPY (EGD);  Surgeon: Rogene Houston, MD;  [facility];  Service: Endoscopy;  Laterality: N/A;  130-moved to 245 Ann notified pt  . ESOPHAGOGASTRODUODENOSCOPY N/A 07/26/2015   Procedure: ESOPHAGOGASTRODUODENOSCOPY (EGD);  Surgeon: Rogene Houston, MD;  [facility];  Service: Endoscopy;  Laterality: N/A;  . HEMICOLECTOMY  10/05    Social History   Socioeconomic History  . Marital status: Married    Spouse name: Not on file  . Number of children: Not on file  . Years of education: Not on file  . Highest education level: Not on file  Occupational History  . Not on file  Social Needs  . Financial resource strain: Not on file  . Food insecurity    Worry: Not on file    Inability: Not on file  . Transportation needs    Medical: Not on file    Non-medical: Not on file  Tobacco Use  . Smoking status: Never Smoker  . Smokeless tobacco: Never Used  Substance and Sexual Activity  . Alcohol use: No  . Drug use: No  . Sexual activity: Never  Lifestyle  . Physical activity    Days per week: Not on file    Minutes per session: Not on file  . Stress: Not on file  Relationships  . Social Herbalist on phone: Not on file    Gets together: Not on file    Attends religious  service: Not on file    Active member of club or organization: Not on file    Attends meetings of clubs or organizations: Not on file    Relationship status: Not on file  . Intimate partner violence    Fear of current or ex partner: Not on file    Emotionally abused: Not on file    Physically abused: Not on file    Forced sexual activity: Not on file  Other Topics Concern  . Not on file  Social History Narrative   Nonsmoker.  She lives alone.  Her son calls her every day to check on her.  Has a daughter involved in care as well.       Vitals:   10/24/18 1058  Weight: 120 lb 3.2 oz (54.5 kg)  Height: 5\' 2"  (1.575 m)  Wt Readings from Last 3 Encounters:  10/24/18 120 lb 3.2 oz (54.5 kg)  09/08/18 115 lb 3.2 oz (52.3 kg)  09/07/18 115 lb 3.2 oz (52.3 kg)     PHYSICAL EXAM General: NAD, frail HEENT: Normal. Neck: No JVD, no thyromegaly. Lungs: Clear to auscultation bilaterally with normal respiratory effort. CV: Regular rate and rhythm, normal S1/S2, no S3/S4, no murmur. No pretibial or periankle edema.     Abdomen: Soft, nontender, no distention.  Neurologic: Alert and oriented.  Psych: Normal affect. Skin: Normal. Musculoskeletal: No gross deformities.      Labs: Lab Results  Component Value Date/Time   K 4.1 09/05/2018 07:38 AM   BUN 17 09/05/2018 07:38 AM   CREATININE 0.56 09/05/2018 07:38 AM   CREATININE 0.65 08/10/2013 02:07 PM   ALT 13 08/25/2018 12:37 PM   TSH 0.877 08/25/2018 02:31 PM   TSH 0.873 07/04/2013 04:39 PM   HGB 12.0 08/31/2018 05:07 AM     Lipids: Lab Results  Component Value Date/Time   LDLCALC UNABLE TO CALCULATE IF TRIGLYCERIDE OVER 400 mg/dL 08/26/2018 05:45 AM   LDLDIRECT 177.9 (H) 08/26/2018 05:45 AM   CHOL 248 (H) 08/26/2018 05:45 AM   TRIG 608 (H) 08/26/2018 05:45 AM   HDL 33 (L) 08/26/2018 05:45 AM       ASSESSMENT AND PLAN: 1.  Paroxysmal atrial fibrillation: Symptomatically stable on Toprol-XL 50 mg twice daily.   Continue apixaban 2.5 mg twice daily for systemic anticoagulation.  2.  Hypertension: Blood pressure is normal.  No changes to therapy.  3.  Acute CVA: On aspirin and rosuvastatin.  She is also on low-dose Eliquis for paroxysmal atrial fibrillation.  4.  Aortic regurgitation: Mild to moderate in severity by echocardiogram in September 2020.  I will monitor.  5.  Dyspnea on exertion: Unclear etiology.  She does have chronic right hemi-diaphragmatic elevation which could be contributing.  She also gets short of breath when using her walker for 20 feet.  She also gets short of breath after talking for about a minute as well as with swallowing certain foods.  In order to exclude a cardiac etiology, I will proceed with a Lexiscan Myoview.  CBC was normal on 08/25/2018.    Disposition: Follow up 3 months   Kate Sable, M.D., F.A.C.C.

## 2018-10-24 NOTE — Patient Instructions (Addendum)
Medication Instructions:  Continue all current medications.  Labwork: none  Testing/Procedures: Your physician has requested that you have a lexiscan myoview. For further information please visit [email or web address]. Please follow instruction sheet, as given. Office will contact with results via phone or letter.    Follow-Up: 3 months   Any Other Special Instructions Will Be Listed Below (If Applicable).  [phone] Cell

## 2018-10-24 NOTE — Addendum Note (Signed)
Addended by: Laurine Blazer on: 10/24/2018 11:49 AM   Modules accepted: Orders

## 2018-10-24 NOTE — Addendum Note (Signed)
Addended by: Laurine Blazer on: 10/24/2018 11:43 AM   Modules accepted: Orders

## 2018-10-28 ENCOUNTER — Encounter (HOSPITAL_COMMUNITY): Payer: Self-pay

## 2018-10-28 ENCOUNTER — Encounter (HOSPITAL_COMMUNITY)
Admission: RE | Admit: 2018-10-28 | Discharge: 2018-10-28 | Disposition: A | Discharge status: Home / Self Care | Payer: Medicare Other | Source: Ambulatory Visit | Attending: Cardiovascular Disease | Admitting: Cardiovascular Disease

## 2018-10-28 ENCOUNTER — Encounter (HOSPITAL_BASED_OUTPATIENT_CLINIC_OR_DEPARTMENT_OTHER)
Admission: RE | Admit: 2018-10-28 | Discharge: 2018-10-28 | Disposition: A | Discharge status: Home / Self Care | Payer: Medicare Other | Source: Ambulatory Visit | Attending: Cardiovascular Disease | Admitting: Cardiovascular Disease

## 2018-10-28 ENCOUNTER — Other Ambulatory Visit: Payer: Self-pay

## 2018-10-28 DIAGNOSIS — I208 Other forms of angina pectoris: Secondary | ICD-10-CM | POA: Insufficient documentation

## 2018-10-28 DIAGNOSIS — R079 Chest pain, unspecified: Secondary | ICD-10-CM | POA: Diagnosis not present

## 2018-10-28 LAB — NM MYOCAR MULTI W/SPECT W/WALL MOTION / EF
LV dias vol: 43 mL (ref 46–106)
LV sys vol: 12 mL
Peak HR: 125 {beats}/min
RATE: 0.52
Rest HR: 75 {beats}/min
SDS: 0
SRS: 1
SSS: 1
TID: 1.01

## 2018-10-28 LAB — GLUCOSE, CAPILLARY: Glucose-Capillary: 101 mg/dL — ABNORMAL HIGH (ref 70–99)

## 2018-10-28 MED ORDER — AMINOPHYLLINE 25 MG/ML IV SOLN
INTRAVENOUS | Status: AC
  Administered 2018-10-28: 11:00:00 25 mg via INTRAVENOUS
  Filled 2018-10-28: qty 10

## 2018-10-28 MED ORDER — TECHNETIUM TC 99M TETROFOSMIN IV KIT
30.0000 | PACK | Freq: Once | INTRAVENOUS | Status: AC | PRN
  Administered 2018-10-28: 11:00:00 32 via INTRAVENOUS

## 2018-10-28 MED ORDER — SODIUM CHLORIDE 0.9% FLUSH
INTRAVENOUS | Status: AC
  Administered 2018-10-28: 11:00:00 10 mL via INTRAVENOUS
  Filled 2018-10-28: qty 10

## 2018-10-28 MED ORDER — TECHNETIUM TC 99M TETROFOSMIN IV KIT
10.0000 | PACK | Freq: Once | INTRAVENOUS | Status: AC | PRN
  Administered 2018-10-28: 9.5 via INTRAVENOUS

## 2018-10-28 MED ORDER — REGADENOSON 0.4 MG/5ML IV SOLN
INTRAVENOUS | Status: AC
  Administered 2018-10-28: 0.4 mg via INTRAVENOUS
  Filled 2018-10-28: qty 5

## 2018-11-12 DIAGNOSIS — I4891 Unspecified atrial fibrillation: Secondary | ICD-10-CM | POA: Diagnosis not present

## 2018-11-12 DIAGNOSIS — E782 Mixed hyperlipidemia: Secondary | ICD-10-CM | POA: Diagnosis not present

## 2018-12-05 ENCOUNTER — Telehealth: Payer: Self-pay | Admitting: Cardiovascular Disease

## 2018-12-05 NOTE — Telephone Encounter (Signed)
Patients son dropped off [facility] paperwork for pt's Eliquis

## 2018-12-06 DIAGNOSIS — M5137 Other intervertebral disc degeneration, lumbosacral region: Secondary | ICD-10-CM | POA: Diagnosis not present

## 2018-12-06 DIAGNOSIS — B0229 Other postherpetic nervous system involvement: Secondary | ICD-10-CM | POA: Diagnosis not present

## 2018-12-06 DIAGNOSIS — M069 Rheumatoid arthritis, unspecified: Secondary | ICD-10-CM | POA: Diagnosis not present

## 2018-12-06 DIAGNOSIS — G894 Chronic pain syndrome: Secondary | ICD-10-CM | POA: Diagnosis not present

## 2018-12-07 ENCOUNTER — Telehealth: Payer: Self-pay | Admitting: *Deleted

## 2018-12-07 NOTE — Telephone Encounter (Signed)
Eliquis patient assistance forms faxed to [employer].

## 2018-12-10 ENCOUNTER — Other Ambulatory Visit: Payer: Self-pay | Admitting: Adult Health

## 2018-12-16 ENCOUNTER — Telehealth: Payer: Self-pay | Admitting: *Deleted

## 2018-12-16 NOTE — Telephone Encounter (Signed)
Pt notified that pt assistance Eliquis as arrived.

## 2018-12-30 ENCOUNTER — Telehealth: Payer: Self-pay | Admitting: *Deleted

## 2018-12-30 NOTE — Telephone Encounter (Signed)
Pt has been approved for [employer] patient assistance from 01/13/19 until  01/12/20

## 2019-01-13 DIAGNOSIS — Z8673 Personal history of transient ischemic attack (TIA), and cerebral infarction without residual deficits: Secondary | ICD-10-CM

## 2019-01-13 HISTORY — DX: Personal history of transient ischemic attack (TIA), and cerebral infarction without residual deficits: Z86.73

## 2019-01-16 DIAGNOSIS — H02831 Dermatochalasis of right upper eyelid: Secondary | ICD-10-CM | POA: Diagnosis not present

## 2019-01-16 DIAGNOSIS — H1013 Acute atopic conjunctivitis, bilateral: Secondary | ICD-10-CM | POA: Diagnosis not present

## 2019-01-16 DIAGNOSIS — Z961 Presence of intraocular lens: Secondary | ICD-10-CM | POA: Diagnosis not present

## 2019-01-25 ENCOUNTER — Telehealth: Payer: Self-pay | Admitting: *Deleted

## 2019-01-25 NOTE — Telephone Encounter (Signed)
Notified pt that eliquis has arrived in office

## 2019-02-06 DIAGNOSIS — B0229 Other postherpetic nervous system involvement: Secondary | ICD-10-CM | POA: Diagnosis not present

## 2019-02-06 DIAGNOSIS — G894 Chronic pain syndrome: Secondary | ICD-10-CM | POA: Diagnosis not present

## 2019-02-06 DIAGNOSIS — M069 Rheumatoid arthritis, unspecified: Secondary | ICD-10-CM | POA: Diagnosis not present

## 2019-02-06 DIAGNOSIS — C189 Malignant neoplasm of colon, unspecified: Secondary | ICD-10-CM | POA: Diagnosis not present

## 2019-02-12 DIAGNOSIS — I480 Paroxysmal atrial fibrillation: Secondary | ICD-10-CM | POA: Diagnosis not present

## 2019-02-12 DIAGNOSIS — E7849 Other hyperlipidemia: Secondary | ICD-10-CM | POA: Diagnosis not present

## 2019-02-13 DIAGNOSIS — Z79899 Other long term (current) drug therapy: Secondary | ICD-10-CM | POA: Diagnosis not present

## 2019-02-13 DIAGNOSIS — M0589 Other rheumatoid arthritis with rheumatoid factor of multiple sites: Secondary | ICD-10-CM | POA: Diagnosis not present

## 2019-02-13 DIAGNOSIS — M549 Dorsalgia, unspecified: Secondary | ICD-10-CM | POA: Diagnosis not present

## 2019-02-13 DIAGNOSIS — M150 Primary generalized (osteo)arthritis: Secondary | ICD-10-CM | POA: Diagnosis not present

## 2019-02-13 DIAGNOSIS — M5136 Other intervertebral disc degeneration, lumbar region: Secondary | ICD-10-CM | POA: Diagnosis not present

## 2019-02-17 ENCOUNTER — Emergency Department (HOSPITAL_COMMUNITY): Payer: Medicare Other

## 2019-02-17 ENCOUNTER — Other Ambulatory Visit: Payer: Self-pay

## 2019-02-17 ENCOUNTER — Emergency Department (HOSPITAL_COMMUNITY)
Admission: EM | Admit: 2019-02-17 | Discharge: 2019-02-17 | Disposition: A | Discharge status: Home / Self Care | Payer: Medicare Other | Attending: Emergency Medicine | Admitting: Emergency Medicine

## 2019-02-17 ENCOUNTER — Encounter (HOSPITAL_COMMUNITY): Payer: Self-pay

## 2019-02-17 DIAGNOSIS — R0602 Shortness of breath: Secondary | ICD-10-CM | POA: Diagnosis not present

## 2019-02-17 DIAGNOSIS — Z20822 Contact with and (suspected) exposure to covid-19: Secondary | ICD-10-CM | POA: Diagnosis not present

## 2019-02-17 DIAGNOSIS — Z79899 Other long term (current) drug therapy: Secondary | ICD-10-CM | POA: Insufficient documentation

## 2019-02-17 DIAGNOSIS — Z0000 Encounter for general adult medical examination without abnormal findings: Secondary | ICD-10-CM | POA: Diagnosis not present

## 2019-02-17 DIAGNOSIS — Z6822 Body mass index (BMI) 22.0-22.9, adult: Secondary | ICD-10-CM | POA: Diagnosis not present

## 2019-02-17 DIAGNOSIS — I10 Essential (primary) hypertension: Secondary | ICD-10-CM | POA: Diagnosis not present

## 2019-02-17 DIAGNOSIS — Z1389 Encounter for screening for other disorder: Secondary | ICD-10-CM | POA: Diagnosis not present

## 2019-02-17 LAB — CBC WITH DIFFERENTIAL/PLATELET
Abs Immature Granulocytes: 0.01 10*3/uL (ref 0.00–0.07)
Basophils Absolute: 0.1 10*3/uL (ref 0.0–0.1)
Basophils Relative: 1 %
Eosinophils Absolute: 0.3 10*3/uL (ref 0.0–0.5)
Eosinophils Relative: 6 %
HCT: 34.2 % — ABNORMAL LOW (ref 36.0–46.0)
Hemoglobin: 10.8 g/dL — ABNORMAL LOW (ref 12.0–15.0)
Immature Granulocytes: 0 %
Lymphocytes Relative: 21 %
Lymphs Abs: 1.2 10*3/uL (ref 0.7–4.0)
MCH: 29.8 pg (ref 26.0–34.0)
MCHC: 31.6 g/dL (ref 30.0–36.0)
MCV: 94.5 fL (ref 80.0–100.0)
Monocytes Absolute: 0.7 10*3/uL (ref 0.1–1.0)
Monocytes Relative: 12 %
Neutro Abs: 3.3 10*3/uL (ref 1.7–7.7)
Neutrophils Relative %: 60 %
Platelets: 171 10*3/uL (ref 150–400)
RBC: 3.62 MIL/uL — ABNORMAL LOW (ref 3.87–5.11)
RDW: 15.5 % (ref 11.5–15.5)
WBC: 5.5 10*3/uL (ref 4.0–10.5)
nRBC: 0 % (ref 0.0–0.2)

## 2019-02-17 LAB — HEPATIC FUNCTION PANEL
ALT: 15 U/L (ref 0–44)
AST: 25 U/L (ref 15–41)
Albumin: 3.4 g/dL — ABNORMAL LOW (ref 3.5–5.0)
Alkaline Phosphatase: 41 U/L (ref 38–126)
Bilirubin, Direct: 0.4 mg/dL — ABNORMAL HIGH (ref 0.0–0.2)
Indirect Bilirubin: 0.2 mg/dL — ABNORMAL LOW (ref 0.3–0.9)
Total Bilirubin: 0.6 mg/dL (ref 0.3–1.2)
Total Protein: 6.4 g/dL — ABNORMAL LOW (ref 6.5–8.1)

## 2019-02-17 LAB — I-STAT CHEM 8, ED
BUN: 23 mg/dL (ref 8–23)
Calcium, Ion: 0.7 mmol/L — CL (ref 1.15–1.40)
Chloride: 106 mmol/L (ref 98–111)
Creatinine, Ser: 0.7 mg/dL (ref 0.44–1.00)
Glucose, Bld: 101 mg/dL — ABNORMAL HIGH (ref 70–99)
HCT: 30 % — ABNORMAL LOW (ref 36.0–46.0)
Hemoglobin: 10.2 g/dL — ABNORMAL LOW (ref 12.0–15.0)
Potassium: 4.3 mmol/L (ref 3.5–5.1)
Sodium: 140 mmol/L (ref 135–145)
TCO2: 28 mmol/L (ref 22–32)

## 2019-02-17 LAB — BLOOD GAS, VENOUS
Acid-Base Excess: 2.2 mmol/L — ABNORMAL HIGH (ref 0.0–2.0)
Bicarbonate: 25.1 mmol/L (ref 20.0–28.0)
FIO2: 21
O2 Saturation: 53.1 %
Patient temperature: 36.8
pCO2, Ven: 42.3 mmHg — ABNORMAL LOW (ref 44.0–60.0)
pH, Ven: 7.412 (ref 7.250–7.430)
pO2, Ven: 32.3 mmHg (ref 32.0–45.0)

## 2019-02-17 NOTE — Discharge Instructions (Addendum)
The testing today does not indicate any acute problems requiring immediate treatment or hospitalization.  It is not clear what is causing her shortness of breath and the sound of fluid in her lungs with auscultation.  Continue taking Eliquis to help treat the atrial fibrillation, which will also prevent blood clots.  If her trouble breathing gets worse, return here for further evaluation.  Call the pulmonary group for a follow-up appointment soon as possible to be evaluated for possible further treatments.

## 2019-02-17 NOTE — ED Provider Notes (Signed)
[facility] EMERGENCY DEPARTMENT Provider Note   CSN: [id number] Arrival date & time: 02/17/19  1450     History Chief Complaint  Patient presents with  . Shortness of Breath    [name] is a 86 y.o. female.  HPI She is here for evaluation of shortness of breath.  She states it is worse when she talks.  She states it has been present for 1 year, and that she does not take any medicine for it.  She denies fever, chills, cough, nausea, vomiting, weakness or dizziness.  She states she "stays at home, and has not been around anyone with Covid."  There are no other known modifying factors.    Past Medical History:  Diagnosis Date  . A-fib ([place])   . Atrial fibrillation ([place])   . Cervicalgia   . Colon cancer ([place])   . Degeneration of cervical intervertebral disc   . Hypertension   . Pinched nerve in shoulder   . Rheumatoid arthritis(714.0)   . RUQ pain   . Vulvar dystrophy   . Yeast infection     Patient Active Problem List   Diagnosis Date Noted  . Vitamin D deficiency 09/18/2018  . Chronic back pain greater than 3 months duration 09/06/2018  . GERD without esophagitis 09/04/2018  . Chronic vertigo 09/04/2018  . Hypomagnesemia 09/04/2018  . Rheumatoid arthritis ([place])   . TIA (transient ischemic attack) 08/25/2018  . CVA (cerebral vascular accident) Acute subcentimeter infarction in the right [place] 08/25/2018  . Chronic anticoagulation/Eliquis for Afib 08/25/2018  . Acute CVA (cerebrovascular accident) ([place]) 08/25/2018  . CAP (community acquired pneumonia) 02/24/2017  . Acute respiratory failure with hypoxia ([place]) 02/23/2017  . Bronchitis 02/22/2017  . Hypoxia 02/22/2017  . Generalized weakness 02/22/2017  . RAD (reactive airway disease) with wheezing, mild intermittent, with acute exacerbation 02/22/2017  . Chronic pain 02/22/2017  . Vulvar dystrophy 05/23/2014  . Atrial fibrillation ([place]) 08/10/2013  . Essential hypertension 07/04/2013  .  Tachycardia 01/31/2013  . Dystrophy, vulva 05/04/2012  . Abdominal pain, epigastric 03/28/2012  . Dyspepsia 04/14/2011  . History of colon cancer 03/17/2011  . Diarrhea 03/17/2011  . HLD (hyperlipidemia) 11/30/2006  . BRONCHIECTASIS 11/30/2006  . RHEUMATOID ARTHRITIS 11/30/2006    Past Surgical History:  Procedure Laterality Date  . ABDOMINAL HYSTERECTOMY  1960'S  . APPENDECTOMY  with hysterectomy  . BACK SURGERY  12/08/10   plate/screws C3 C4  . CHOLECYSTECTOMY  2002  . COLONOSCOPY  04/02/2009  . COLONOSCOPY  06/04/05  . COLONOSCOPY N/A 07/26/2015   Procedure: COLONOSCOPY;  Surgeon: Rogene Houston, MD;  Location[facility];  Service: Endoscopy;  Laterality: N/A;  1240  . COLONOSCOPY WITH ESOPHAGOGASTRODUODENOSCOPY (EGD) N/A 04/08/2012   Procedure: COLONOSCOPY WITH ESOPHAGOGASTRODUODENOSCOPY (EGD);  Surgeon: Rogene Houston, MD;  Location: [facility];  Service: Endoscopy;  Laterality: N/A;  130-moved to 245 Ann notified pt  . ESOPHAGOGASTRODUODENOSCOPY N/A 07/26/2015   Procedure: ESOPHAGOGASTRODUODENOSCOPY (EGD);  Surgeon: Rogene Houston, MD;  Location: [facility];  Service: Endoscopy;  Laterality: N/A;  . HEMICOLECTOMY  10/05     OB History    Gravida  4   Para  4   Term      Preterm      AB      Living  4     SAB      TAB      Ectopic      Multiple      Live Births  4           Family History  Problem Relation Age of Onset  . Rheum arthritis Mother   . Parkinsonism Father   . Hiatal hernia Sister   . Healthy Brother   . Healthy Daughter   . Healthy Son   . Healthy Son     Social History   Tobacco Use  . Smoking status: Never Smoker  . Smokeless tobacco: Never Used  Substance Use Topics  . Alcohol use: No  . Drug use: No    Home Medications Prior to Admission medications   Medication Sig Start Date End Date Taking? Authorizing Provider  acetaminophen (TYLENOL) 325 MG tablet Take 2 tablets (650 mg total) by mouth every 6 (six)  hours as needed for mild pain or headache (or Fever >/= 101). 08/31/18   Barton Dubois, MD  apixaban (ELIQUIS) 2.5 MG TABS tablet Take 1 tablet (2.5 mg total) by mouth 2 (two) times daily. 09/08/18   Gerlene Fee, NP  aspirin 81 MG chewable tablet Chew 1 tablet (81 mg total) by mouth daily. 09/01/18   Barton Dubois, MD  cholecalciferol (VITAMIN D3) 25 MCG (1000 UT) tablet Take 2,000 Units by mouth 2 (two) times daily.     [provider]  HYDROcodone-acetaminophen (NORCO) 7.5-325 MG tablet Take 1 tablet by mouth 3 (three) times daily as needed for moderate pain. 09/08/18   Gerlene Fee, NP  LASTACAFT 0.25 % SOLN Apply 1 drop to eye 2 (two) times daily. 01/17/19   [provider]  leflunomide (ARAVA) 20 MG tablet Take 1 tablet (20 mg total) by mouth daily. 09/08/18   Gerlene Fee, NP  meclizine (ANTIVERT) 12.5 MG tablet Take 1 tablet (12.5 mg total) by mouth 2 (two) times daily as needed for dizziness. 09/08/18   Gerlene Fee, NP  metoprolol succinate (TOPROL-XL) 25 MG 24 hr tablet Take 75 mg by mouth daily. 02/07/19   [provider]  metoprolol succinate (TOPROL-XL) 50 MG 24 hr tablet Take 1 tablet by mouth 2 (two) times daily. 10/02/18   [provider]  omeprazole (PRILOSEC) 20 MG capsule TAKE ONE CAPSULE BY MOUTH TWICE DAILY. 04/18/18   Setzer, Rona Ravens, NP  rosuvastatin (CRESTOR) 20 MG tablet Take 1 tablet (20 mg total) by mouth daily at 6 PM. 09/08/18   Nyoka Cowden, Phylis Bougie, NP    Allergies    Iodinated diagnostic agents, Nerve block tray, Nitrofuran derivatives, Triamcinolone acetonide, and Vibra-tab [doxycycline]  Review of Systems   Review of Systems  All other systems reviewed and are negative.   Physical Exam Updated Vital Signs BP (!) 140/58 (BP Location: Left Arm)   Pulse 86   Temp 98.2 F (36.8 C) (Oral)   Resp 18   Ht 5\' 2"  (1.575 m)   Wt 54.9 kg   SpO2 91%   BMI 22.13 kg/m   Physical Exam Vitals and nursing note reviewed.    Constitutional:      General: She is not in acute distress.    Appearance: She is well-developed. She is ill-appearing. She is not toxic-appearing or diaphoretic.  HENT:     Head: Normocephalic and atraumatic.     Right Ear: External ear normal.     Left Ear: External ear normal.  Eyes:     Conjunctiva/sclera: Conjunctivae normal.     Pupils: Pupils are equal, round, and reactive to light.  Neck:     Trachea: Phonation normal.  Cardiovascular:  Rate and Rhythm: Normal rate and regular rhythm.     Heart sounds: Normal heart sounds.  Pulmonary:     Effort: Pulmonary effort is normal.     Comments: Decreased air movement bilaterally but breath sounds heard to the bases.  Scattered crackles bilateral lungs.  No increased work of breathing.  Chest is moderately kyphotic. Chest:     Chest wall: No tenderness.  Abdominal:     Palpations: Abdomen is soft.     Tenderness: There is no abdominal tenderness.  Musculoskeletal:        General: No swelling or tenderness. Normal range of motion.     Cervical back: Normal range of motion and neck supple.     Right lower leg: No edema.     Left lower leg: No edema.  Skin:    General: Skin is warm and dry.  Neurological:     Mental Status: She is alert and oriented to person, place, and time.     Cranial Nerves: No cranial nerve deficit.     Sensory: No sensory deficit.     Motor: No abnormal muscle tone.     Coordination: Coordination normal.  Psychiatric:        Mood and Affect: Mood normal.        Behavior: Behavior normal.        Thought Content: Thought content normal.        Judgment: Judgment normal.     ED Results / Procedures / Treatments   Labs (all labs ordered are listed, but only abnormal results are displayed) Labs Reviewed  BLOOD GAS, VENOUS - Abnormal; Notable for the following components:      Result Value   pCO2, Ven 42.3 (*)    Acid-Base Excess 2.2 (*)    All other components within normal limits  HEPATIC  FUNCTION PANEL - Abnormal; Notable for the following components:   Total Protein 6.4 (*)    Albumin 3.4 (*)    Bilirubin, Direct 0.4 (*)    Indirect Bilirubin 0.2 (*)    All other components within normal limits  CBC WITH DIFFERENTIAL/PLATELET - Abnormal; Notable for the following components:   RBC 3.62 (*)    Hemoglobin 10.8 (*)    HCT 34.2 (*)    All other components within normal limits  I-STAT CHEM 8, ED - Abnormal; Notable for the following components:   Glucose, Bld 101 (*)    Calcium, Ion 0.70 (*)    Hemoglobin 10.2 (*)    HCT 30.0 (*)    All other components within normal limits  SARS CORONAVIRUS 2 (TAT 6-24 HRS)    EKG EKG Interpretation  Date/Time:  Friday February 17 2019 15:17:11 EST Ventricular Rate:  91 PR Interval:    QRS Duration: 93 QT Interval:  353 QTC Calculation: 435 R Axis:   -12 Text Interpretation: Sinus rhythm Abnormal R-wave progression, early transition Nonspecific T abnrm, anterolateral leads since last tracing no significant change Confirmed by Daleen Bo [phone]) on 02/17/2019 3:27:04 PM   Radiology DG Chest Port 1 View  Result Date: 02/17/2019 CLINICAL DATA:  Shortness of breath, intermittent, worsening with talking EXAM: PORTABLE CHEST 1 VIEW COMPARISON:  Radiograph August 25, 2018, CT April 23, 2014 FINDINGS: Chronic elevation of right hemidiaphragm with adjacent atelectatic changes, similar to prior. Chronically coarsened interstitial changes and mild chronic bronchitic features are unchanged from comparison. No new consolidative opacity. No pneumothorax or effusion. No convincing features of edema. The aorta is calcified. The remaining  cardiomediastinal contours are unremarkable. No acute osseous or soft tissue abnormality. Degenerative changes are present in the imaged spine and shoulders. IMPRESSION: No acute cardiopulmonary abnormality. Stable chronic findings as described above. Aortic Atherosclerosis (ICD10-I70.0). Electronically Signed    By: Lovena Le M.D.   On: 02/17/2019 15:26    Procedures Procedures (including critical care time)  Medications Ordered in ED Medications - No data to display  ED Course  I have reviewed the triage vital signs and the nursing notes.  Pertinent labs & imaging results that were available during my care of the patient were reviewed by me and considered in my medical decision making (see chart for details).  Clinical Course as of Feb 17 1835  Fri Feb 17, 2019  1731 Normal except ionized calcium low, hemoglobin low  I-stat chem 8, ED (not at [facility] or [facility])(!!) [EW]  1731 Normal except PCO2 low, acid-base high  Blood gas, venous(!) [EW]  1731 Normal except total protein low, albumin low, direct bilirubin high, indirect bilirubin  Hepatic function panel(!) [EW]  1732 Normal except hemoglobin low  CBC with Differential(!) [EW]    Clinical Course User Index [EW] Daleen Bo, MD   MDM Rules/Calculators/A&P                       Patient Vitals for the past 24 hrs:  BP Temp Temp src Pulse Resp SpO2 Height Weight  02/17/19 1521 (!) 140/58 98.2 F (36.8 C) Oral 86 18 91 % -- --  02/17/19 1459 -- -- -- -- -- -- 5\' 2"  (1.575 m) 54.9 kg  02/17/19 1458 (!) 150/75 (!) 97.5 F (36.4 C) Oral (!) 103 18 94 % -- --    6:15 P.M.-I discussed the case and findings with the patient's son, Simona Huh.  He also states the patient has had shortness of breath for about a year, and this is worse with exertion.  Yesterday she had a period of rapid heartbeat, which resolved spontaneously.  He feels like she was in atrial fibrillation, then.  He has been trying to get her doctor to refer her to the lung doctor but she has not done that yet.  He would be comfortable going to get her, and take her home.  6:24 PM Reevaluation with update and discussion. After initial assessment and treatment, an updated evaluation reveals patient is comfortable.  I removed oxygen and her oxygenation stayed normal for about 5  minutes while I talk to her.  Findings discussed with the patient and all questions were answered. Daleen Bo   Medical Decision Making: Shortness of breath, chronic, with reassuring evaluation.  She is on Eliquis.  Low suspicion for PE.  Unable to get CT without long prep, and VQ scan is not available at this facility, at this time.  Since the patient symptoms are chronic, we will refer her to pulmonary, for further evaluation and treatment.  Patient and son know to have her come back here if she gets worse.  Doubt ACS, pneumonia, metabolic instability or impending vascular collapse.  CRITICAL CARE-no Performed by: Daleen Bo   Nursing Notes Reviewed/ Care Coordinated Applicable Imaging Reviewed Interpretation of Laboratory Data incorporated into ED treatment  The patient appears reasonably screened and/or stabilized for discharge and I doubt any other medical condition or other [facility] requiring further screening, evaluation, or treatment in the ED at this time prior to discharge.  Plan: Home Medications-continue usual; Home Treatments-rest; return here if the recommended treatment, does not  improve the symptoms; Recommended follow up-pulmonary follow-up as soon as possible.  PCP, as needed    Final Clinical Impression(s) / ED Diagnoses Final diagnoses:  SOB (shortness of breath)    Rx / DC Orders ED Discharge Orders    None       Daleen Bo, MD 02/17/19 [phone]

## 2019-02-17 NOTE — ED Triage Notes (Signed)
Pt presents to ED with complaints of SOB off and on today. Pt states it gets worse when she talks. Pt denies fever.

## 2019-03-01 ENCOUNTER — Ambulatory Visit: Payer: Medicare Other | Admitting: Internal Medicine

## 2019-03-01 ENCOUNTER — Encounter: Payer: Self-pay | Admitting: Internal Medicine

## 2019-03-01 ENCOUNTER — Other Ambulatory Visit: Payer: Self-pay

## 2019-03-01 VITALS — BP 120/70 | HR 78 | Temp 97.1°F | Ht 62.0 in | Wt 124.0 lb

## 2019-03-01 DIAGNOSIS — I480 Paroxysmal atrial fibrillation: Secondary | ICD-10-CM

## 2019-03-01 DIAGNOSIS — R0602 Shortness of breath: Secondary | ICD-10-CM

## 2019-03-01 DIAGNOSIS — M05742 Rheumatoid arthritis with rheumatoid factor of left hand without organ or systems involvement: Secondary | ICD-10-CM | POA: Diagnosis not present

## 2019-03-01 DIAGNOSIS — J849 Interstitial pulmonary disease, unspecified: Secondary | ICD-10-CM | POA: Diagnosis not present

## 2019-03-01 DIAGNOSIS — M05741 Rheumatoid arthritis with rheumatoid factor of right hand without organ or systems involvement: Secondary | ICD-10-CM

## 2019-03-01 NOTE — Patient Instructions (Addendum)
The patient should have follow up scheduled with myself in 2 months.   Prior to next visit patient should have a full set of PFTs including: Spirometry with bronchodilator if obstructed Lung Volumes DLCO  CT Chest ordered - to be completed prior to next appt, not emergent

## 2019-03-01 NOTE — Progress Notes (Signed)
[name]    [id number]    30-Oct-1933  Primary Care Physician:Fusco, Purcell Nails, MD  Referring Physician: [name], [address] [place],  [place] [address] Reason for Consultation: shortness of breath Date of Consultation: 03/01/2019  Chief complaint:   Chief Complaint  Patient presents with  . Consult    Referred from ED for SOB episode. States she has been feeling ok since being home. Still gets SOB at times, especially when talking a lot.      HPI: Worsening shortness of breath for the past year. Dyspnea with talking and minimal exertion.   History of RA on leuflonamide. Recent ED visit for shortness of breath. Mostly hand involvement.   Also has Atrial Fib on rate control and Eliquis. She has occasional palpitations with her A. Fib that she can perceive. Usually at night, last for 15-20 minutes.   If she walks slowly she can go to the bathroom without difficulty, but she does get sob with minimal exertion and even at rest.   Social history:  Occupation: Worked in a Education administrator.  Exposures: Lives independently. Independent with ADLs - but kids help with errands outside the house and with cleaning. No pets at home. Lives in [place].  Smoking history: never smoker, no passive smoke exposure.   Social History   Occupational History  . Not on file  Tobacco Use  . Smoking status: Never Smoker  . Smokeless tobacco: Never Used  Substance and Sexual Activity  . Alcohol use: No  . Drug use: No  . Sexual activity: Never    Relevant family history:  Family History  Problem Relation Age of Onset  . Rheum arthritis Mother   . Parkinsonism Father   . Hiatal hernia Sister   . Healthy Brother   . Healthy Daughter   . Healthy Son   . Healthy Son     Past Medical History:  Diagnosis Date  . A-fib ([place])   . Atrial fibrillation ([place])   . Cervicalgia   . Colon cancer ([place])   . Degeneration of cervical intervertebral disc   .  Hypertension   . Pinched nerve in shoulder   . Rheumatoid arthritis(714.0)   . RUQ pain   . Vulvar dystrophy   . Yeast infection     Past Surgical History:  Procedure Laterality Date  . ABDOMINAL HYSTERECTOMY  1960'S  . APPENDECTOMY  with hysterectomy  . BACK SURGERY  12/08/10   plate/screws C3 C4  . CHOLECYSTECTOMY  2002  . COLONOSCOPY  04/02/2009  . COLONOSCOPY  06/04/05  . COLONOSCOPY N/A 07/26/2015   Procedure: COLONOSCOPY;  Surgeon: Rogene Houston, MD;  [facility];  Service: Endoscopy;  Laterality: N/A;  1240  . COLONOSCOPY WITH ESOPHAGOGASTRODUODENOSCOPY (EGD) N/A 04/08/2012   Procedure: COLONOSCOPY WITH ESOPHAGOGASTRODUODENOSCOPY (EGD);  Surgeon: Rogene Houston, MD;  [facility];  Service: Endoscopy;  Laterality: N/A;  130-moved to 245 Ann notified pt  . ESOPHAGOGASTRODUODENOSCOPY N/A 07/26/2015   Procedure: ESOPHAGOGASTRODUODENOSCOPY (EGD);  Surgeon: Rogene Houston, MD;  [facility];  Service: Endoscopy;  Laterality: N/A;  . HEMICOLECTOMY  10/05    Review of systems: Review of Systems  Constitutional: Negative for chills, fever and weight loss.  HENT: Positive for congestion and sinus pain. Negative for sore throat.   Eyes: Negative for discharge and redness.  Respiratory: Positive for shortness of breath. Negative for cough, hemoptysis, sputum production and wheezing.  Cardiovascular: Positive for palpitations. Negative for chest pain and leg swelling.  Gastrointestinal: Negative for heartburn, nausea and vomiting.  Musculoskeletal: Negative for joint pain and myalgias.  Skin: Negative for rash.  Neurological: Negative for dizziness, tremors, focal weakness and headaches.  Endo/Heme/Allergies: Negative for environmental allergies.  Psychiatric/Behavioral: Negative for depression. The patient is not nervous/anxious.   All other systems reviewed and are negative.   Physical Exam: Blood pressure 120/70, pulse 78, temperature (!)  97.1 F (36.2 C), temperature source Temporal, height 5\' 2"  (1.575 m), weight 124 lb (56.2 kg), SpO2 94 %. Gen:      No acute distress. Sob with talking and can't keep mask on. Eyes: EOMI, sclera anicteric ENT:  no nasal polyps, mucus membranes moist, mild nasal debris Lungs:   Kyphosis, inspiratory squawk diffusely in the lower lung fields. No wheezes or crackles. Symmetric chest wall excursion  CV:         Regular rate and rhythm; no murmurs, rubs, or gallops.  No pedal edema MSK: no acute synovitis of DIP or PIP joints, no mechanics hands.  Skin:      Warm and dry, pale Neuro: normal speech, no focal facial asymmetry Psych: alert and oriented x3, normal mood and affect   Data Reviewed/Medical Decision Making:  Independent interpretation of tests: Imaging: . Review of patient's chest xray Feb 2021 demonst images revealed elevated right hemidiaphragm which is chronic since at least 2010. She has kyphosis. No emphysema. CT Angio 2016 demonstrates mild bibasilar atelectasis. The patient's images have been independently reviewed by me.    Echo: 1. The left ventricle has normal systolic function with an ejection  fraction of 60-65%. The cavity size was normal. Left ventricular diastolic  Doppler parameters are indeterminate.  2. The right ventricle has normal systolic function. The cavity was  normal. There is no increase in right ventricular wall thickness.  3. Left atrial size was mildly dilated.  4. The aortic valve has an indeterminate number of cusps. Mild thickening  of the aortic valve. Mild calcification of the aortic valve. Aortic valve  regurgitation is mild to moderate by color flow Doppler. Mild aortic  annular calcification noted.  5. The mitral valve is abnormal. Mild thickening of the mitral valve  leaflet. Mild calcification of the mitral valve leaflet. There is mild  mitral annular calcification present. No evidence of mitral valve  stenosis.  6. The aorta is  normal in size and structure.  7. Pulmonary hypertension is indeterminant, inadequate TR jet.    Labs: Na 140. Hgb 10.2, VBG without hypercapnea  Immunization status:  Immunization History  Administered Date(s) Administered  . Influenza-Unspecified 10/12/2012, 10/06/2017  . PPD Test 08/31/2018  . Pneumococcal Conjugate-13 06/19/2014    . I reviewed prior external note(s) from ED visit . I reviewed the result(s) of the labs and imaging as noted above.  . I have ordered PFTs   Assessment:  Shortness of Breath Rheumatoid Arthritis Possible ILD related to RA Atrial Fibrillation on Eliquis and BB for Rate control Elevated Right Hemidiaphragm  Plan/Recommendations:  Ambulatory desaturation study today does not qualify her for home oxygen desaturation from 97 to 94% but she was not able to exert herself.  She has worsening shortness of breath and desaturation without hypoxemia which is not explained by the elevated right hemidiaphragm and kyphosis alone.  Will proceed with: PFTs - spiro, DLCO lung volumes Non contrast CT Chest to evaluate for ILD, discussed the possibility of ILD related to RA with her  as well.   Return to Care: Return in about 2 months (around 04/29/2019).  Lenice Llamas, MD Pulmonary and [place]  CC: [name], MD

## 2019-03-03 ENCOUNTER — Telehealth: Payer: Self-pay

## 2019-03-03 NOTE — Telephone Encounter (Signed)
Virtual Visit Pre-Appointment Phone Call  "(Name), I am calling you today to discuss your upcoming appointment. We are currently trying to limit exposure to the virus that causes COVID-19 by seeing patients at home rather than in the office."  1. "What is the BEST phone number to call the day of the visit?" - include this in appointment notes  2. "Do you have or have access to (through a family member/friend) a smartphone with video capability that we can use for your visit?" a. If yes - list this number in appt notes as "cell" (if different from BEST phone #) and list the appointment type as a VIDEO visit in appointment notes b. If no - list the appointment type as a PHONE visit in appointment notes  Confirm consent - "In the setting of the current Covid19 crisis, you are scheduled for a (phone or video) visit with your provider on (date) at (time).  Just as we do with many in-office visits, in order for you to participate in this visit, we must obtain consent.  If you'd like, I can send this to your mychart (if signed up) or email for you to review.  Otherwise, I can obtain your verbal consent now.  All virtual visits are billed to your insurance company just like a normal visit would be.  By agreeing to a virtual visit, we'd like you to understand that the technology does not allow for your provider to perform an examination, and thus may limit your provider's ability to fully assess your condition. If your provider identifies any concerns that need to be evaluated in person, we will make arrangements to do so.  Finally, though the technology is pretty good, we cannot assure that it will always work on either your or our end, and in the setting of a video visit, we may have to convert it to a phone-only visit.  In either situation, we cannot ensure that we have a secure connection.  Are you willing to proceed?" STAFF: Did the patient verbally acknowledge consent to telehealth visit? Document  YES/NO here:  3. Advise patient to be prepared - "Two hours prior to your appointment, go ahead and check your blood pressure, pulse, oxygen saturation, and your weight (if you have the equipment to check those) and write them all down. When your visit starts, your provider will ask you for this information. If you have an Apple Watch or Kardia device, please plan to have heart rate information ready on the day of your appointment. Please have a pen and paper handy nearby the day of the visit as well."  4. Give patient instructions for MyChart download to smartphone OR Doximity/Doxy.me as below if video visit (depending on what platform provider is using)  5. Inform patient they will receive a phone call 15 minutes prior to their appointment time (may be from unknown caller ID) so they should be prepared to answer    TELEPHONE CALL NOTE  [name] has been deemed a candidate for a follow-up tele-health visit to limit community exposure during the Covid-19 pandemic. I spoke with the patient via phone to ensure availability of phone/video source, confirm preferred email & phone number, and discuss instructions and expectations.  I reminded [name] to be prepared with any vital sign and/or heart rhythm information that could potentially be obtained via home monitoring, at the time of her visit. I reminded [name] to expect a phone call prior to her visit.  Dorothey Baseman 03/03/2019 4:41 PM   INSTRUCTIONS FOR DOWNLOADING THE MYCHART APP TO SMARTPHONE  - The patient must first make sure to have activated MyChart and know their login information - If Apple, go to [employer] and type in MyChart in the search bar and download the app. If Android, ask patient to go to [employer] and type in [facility] in the search bar and download the app. The app is free but as with any other app downloads, their phone may require them to verify saved payment information or  Apple/Android password.  - The patient will need to then log into the app with their MyChart username and password, and select [facility] as their healthcare provider to link the account. When it is time for your visit, go to the MyChart app, find appointments, and click Begin Video Visit. Be sure to Select Allow for your device to access the Microphone and Camera for your visit. You will then be connected, and your provider will be with you shortly.  **If they have any issues connecting, or need assistance please contact MyChart service desk [phone])**  **If using a computer, in order to ensure the best quality for their visit they will need to use either of the following Internet Browsers: [employer], or Google Chrome**  IF USING DOXIMITY or DOXY.ME - The patient will receive a link just prior to their visit by text.     FULL LENGTH CONSENT FOR TELE-HEALTH VISIT   I hereby voluntarily request, consent and authorize [facility] and its employed or contracted physicians, physician assistants, nurse practitioners or other licensed health care professionals (the Practitioner), to provide me with telemedicine health care services (the "Services") as deemed necessary by the treating Practitioner. I acknowledge and consent to receive the Services by the Practitioner via telemedicine. I understand that the telemedicine visit will involve communicating with the Practitioner through live audiovisual communication technology and the disclosure of certain medical information by electronic transmission. I acknowledge that I have been given the opportunity to request an in-person assessment or other available alternative prior to the telemedicine visit and am voluntarily participating in the telemedicine visit.  I understand that I have the right to withhold or withdraw my consent to the use of telemedicine in the course of my care at any time, without affecting my right to future care  or treatment, and that the Practitioner or I may terminate the telemedicine visit at any time. I understand that I have the right to inspect all information obtained and/or recorded in the course of the telemedicine visit and may receive copies of available information for a reasonable fee.  I understand that some of the potential risks of receiving the Services via telemedicine include:  Marland Kitchen Delay or interruption in medical evaluation due to technological equipment failure or disruption; . Information transmitted may not be sufficient (e.g. poor resolution of images) to allow for appropriate medical decision making by the Practitioner; and/or  . In rare instances, security protocols could fail, causing a breach of personal health information.  Furthermore, I acknowledge that it is my responsibility to provide information about my medical history, conditions and care that is complete and accurate to the best of my ability. I acknowledge that Practitioner's advice, recommendations, and/or decision may be based on factors not within their control, such as incomplete or inaccurate data provided by me or distortions of diagnostic images or specimens that may result from electronic transmissions. I understand that the practice  of medicine is not an Chief Strategy Officer and that Practitioner makes no warranties or guarantees regarding treatment outcomes. I acknowledge that I will receive a copy of this consent concurrently upon execution via email to the email address I last provided but may also request a printed copy by calling the office of [facility].    I understand that my insurance will be billed for this visit.   I have read or had this consent read to me. . I understand the contents of this consent, which adequately explains the benefits and risks of the Services being provided via telemedicine.  . I have been provided ample opportunity to ask questions regarding this consent and the Services and have had  my questions answered to my satisfaction. . I give my informed consent for the services to be provided through the use of telemedicine in my medical care  By participating in this telemedicine visit I agree to the above.

## 2019-03-08 ENCOUNTER — Telehealth (INDEPENDENT_AMBULATORY_CARE_PROVIDER_SITE_OTHER): Payer: Medicare Other | Admitting: Cardiovascular Disease

## 2019-03-08 ENCOUNTER — Encounter: Payer: Self-pay | Admitting: Cardiovascular Disease

## 2019-03-08 VITALS — BP 120/70 | HR 78 | Ht 62.0 in | Wt 124.0 lb

## 2019-03-08 DIAGNOSIS — R0602 Shortness of breath: Secondary | ICD-10-CM

## 2019-03-08 DIAGNOSIS — I351 Nonrheumatic aortic (valve) insufficiency: Secondary | ICD-10-CM

## 2019-03-08 DIAGNOSIS — I10 Essential (primary) hypertension: Secondary | ICD-10-CM

## 2019-03-08 DIAGNOSIS — R0600 Dyspnea, unspecified: Secondary | ICD-10-CM | POA: Diagnosis not present

## 2019-03-08 DIAGNOSIS — I480 Paroxysmal atrial fibrillation: Secondary | ICD-10-CM | POA: Diagnosis not present

## 2019-03-08 DIAGNOSIS — Z8673 Personal history of transient ischemic attack (TIA), and cerebral infarction without residual deficits: Secondary | ICD-10-CM | POA: Diagnosis not present

## 2019-03-08 NOTE — Patient Instructions (Signed)
Medication Instructions:  Continue all current medications.   Labwork: none  Testing/Procedures: none  Follow-Up: 6 months   Any Other Special Instructions Will Be Listed Below (If Applicable).   If you need a refill on your cardiac medications before your next appointment, please call your pharmacy.  

## 2019-03-08 NOTE — Progress Notes (Signed)
Virtual Visit via Telephone Note   This visit type was conducted due to national recommendations for restrictions regarding the COVID-19 Pandemic (e.g. social distancing) in an effort to limit this patient's exposure and mitigate transmission in our community.  Due to her co-morbid illnesses, this patient is at least at moderate risk for complications without adequate follow up.  This format is felt to be most appropriate for this patient at this time.  The patient did not have access to video technology/had technical difficulties with video requiring transitioning to audio format only (telephone).  All issues noted in this document were discussed and addressed.  No physical exam could be performed with this format.  Please refer to the patient's chart for her  consent to telehealth for [facility].   Date:  03/08/2019   ID:  [name], DOB June 20, 1933, MRN [id number]  Patient Location: Home Provider Location: Office  PCP:  [name], MD  Cardiologist:  Kate Sable, MD  Electrophysiologist:  None   Evaluation Performed:  Follow-Up Visit  Chief Complaint: Paroxysmal atrial fibrillation  History of Present Illness:    [name] is a 86 y.o. female with a history of paroxysmal atrial fibrillation and rheumatoid arthritis. 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  She was advised she continue taking aspirin and low-dose Eliquis 2.5 mg twice daily.  She was recently evaluated in the ED for shortness of breath on 02/17/2019. I have personally reviewed all documentation, labs, radiographic and cardiovascular studies, and independently interpreted all ECG's.  It was felt that she had some acute on chronic shortness of breath.  She was referred to pulmonary.  Chest x-ray showed no acute cardiopulmonary abnormalities with stable chronic findings.  Calcium low at 0.7 and hemoglobin low at 10.8.  She saw pulmonary on 03/01/2019  who has scheduled PFTs and a noncontrast chest CT to evaluate for interstitial lung disease.  She very seldom has palpitations. Her primary complaints relate to worsening shortness of breath, which even occurs while talking. She denies chest pain.   Soc Hx: Her son, [name], is from [place].  Past Medical History:  Diagnosis Date  . A-fib ([place])   . Atrial fibrillation ([place])   . Cervicalgia   . Colon cancer ([place])   . Degeneration of cervical intervertebral disc   . Hypertension   . Pinched nerve in shoulder   . Rheumatoid arthritis(714.0)   . RUQ pain   . Vulvar dystrophy   . Yeast infection    Past Surgical History:  Procedure Laterality Date  . ABDOMINAL HYSTERECTOMY  1960'S  . APPENDECTOMY  with hysterectomy  . BACK SURGERY  12/08/10   plate/screws C3 C4  . CHOLECYSTECTOMY  2002  . COLONOSCOPY  04/02/2009  . COLONOSCOPY  06/04/05  . COLONOSCOPY N/A 07/26/2015   Procedure: COLONOSCOPY;  Surgeon: Rogene Houston, MD;  Location: [facility];  Service: Endoscopy;  Laterality: N/A;  1240  . COLONOSCOPY WITH ESOPHAGOGASTRODUODENOSCOPY (EGD) N/A 04/08/2012   Procedure: COLONOSCOPY WITH ESOPHAGOGASTRODUODENOSCOPY (EGD);  Surgeon: Rogene Houston, MD;  Location: [facility];  Service: Endoscopy;  Laterality: N/A;  130-moved to 245 Ann notified pt  . ESOPHAGOGASTRODUODENOSCOPY N/A 07/26/2015   Procedure: ESOPHAGOGASTRODUODENOSCOPY (EGD);  Surgeon: Rogene Houston, MD;  Location: [facility];  Service: Endoscopy;  Laterality: N/A;  . HEMICOLECTOMY  10/05     Current Meds  Medication Sig  . acetaminophen (TYLENOL) 325 MG tablet Take  2 tablets (650 mg total) by mouth every 6 (six) hours as needed for mild pain or headache (or Fever >/= 101).  Marland Kitchen apixaban (ELIQUIS) 2.5 MG TABS tablet Take 1 tablet (2.5 mg total) by mouth 2 (two) times daily.  Marland Kitchen aspirin 81 MG chewable tablet Chew 1 tablet (81 mg total) by mouth daily.  . cholecalciferol (VITAMIN D3) 25 MCG (1000 UT) tablet  Take 2,000 Units by mouth 2 (two) times daily.   Marland Kitchen HYDROcodone-acetaminophen (NORCO) 7.5-325 MG tablet Take 1 tablet by mouth 3 (three) times daily as needed for moderate pain.  Marland Kitchen LASTACAFT 0.25 % SOLN Apply 1 drop to eye 2 (two) times daily.  Marland Kitchen leflunomide (ARAVA) 20 MG tablet Take 1 tablet (20 mg total) by mouth daily.  . meclizine (ANTIVERT) 12.5 MG tablet Take 1 tablet (12.5 mg total) by mouth 2 (two) times daily as needed for dizziness.  . metoprolol succinate (TOPROL-XL) 25 MG 24 hr tablet Take 75 mg by mouth daily.  Marland Kitchen omeprazole (PRILOSEC) 20 MG capsule TAKE ONE CAPSULE BY MOUTH TWICE DAILY.  . rosuvastatin (CRESTOR) 20 MG tablet Take 1 tablet (20 mg total) by mouth daily at 6 PM.     Allergies:   Iodinated diagnostic agents, Nerve block tray, Nitrofuran derivatives, Triamcinolone acetonide, and Vibra-tab [doxycycline]   Social History   Tobacco Use  . Smoking status: Never Smoker  . Smokeless tobacco: Never Used  Substance Use Topics  . Alcohol use: No  . Drug use: No     Family Hx: The patient's family history includes Healthy in her brother, daughter, son, and son; Hiatal hernia in her sister; Parkinsonism in her father; Rheum arthritis in her mother.  ROS:   Please see the history of present illness.     All other systems reviewed and are negative.   Prior CV studies:   The following studies were reviewed today:  Nuclear stress test 10/28/2018:   Equivocal, nondiagnostic ST segment depression noted with persistent T wave inversions.  Small, moderate intensity, mid to basal inferoseptal defect that is fixed and most prominent on the rest imaging. This is consistent with soft tissue attenuation. No reversible perfusion defects to indicate ischemia.  This is a low risk study.  Nuclear stress EF: 72%.     Echocardiogram 08/23/2018:  1. The left ventricle has normal systolic function with an ejection  fraction of 60-65%. The cavity size was normal. Left  ventricular diastolic  Doppler parameters are indeterminate.  2. The right ventricle has normal systolic function. The cavity was  normal. There is no increase in right ventricular wall thickness.  3. Left atrial size was mildly dilated.  4. The aortic valve has an indeterminate number of cusps. Mild thickening  of the aortic valve. Mild calcification of the aortic valve. Aortic valve  regurgitation is mild to moderate by color flow Doppler. Mild aortic  annular calcification noted.  5. The mitral valve is abnormal. Mild thickening of the mitral valve  leaflet. Mild calcification of the mitral valve leaflet. There is mild  mitral annular calcification present. No evidence of mitral valve  stenosis.  6. The aorta is normal in size and structure.  7. Pulmonary hypertension is indeterminant, inadequate TR jet.   Labs/Other Tests and Data Reviewed:    EKG:  An ECG dated 02/16/2019 was personally reviewed today and demonstrated:  Sinus rhythm with precordial T wave inversions, markedly unchanged from ECG performed on 08/25/2018  Recent Labs: 08/22/2018: B Natriuretic Peptide 298.0 08/25/2018: TSH  0.877 09/05/2018: Magnesium 1.8 02/17/2019: ALT 15; BUN 23; Creatinine, Ser 0.70; Hemoglobin 10.2; Platelets 171; Potassium 4.3; Sodium 140   Recent Lipid Panel Lab Results  Component Value Date/Time   CHOL 248 (H) 08/26/2018 05:45 AM   TRIG 608 (H) 08/26/2018 05:45 AM   HDL 33 (L) 08/26/2018 05:45 AM   CHOLHDL 7.5 08/26/2018 05:45 AM   LDLCALC UNABLE TO CALCULATE IF TRIGLYCERIDE OVER 400 mg/dL 08/26/2018 05:45 AM   LDLDIRECT 177.9 (H) 08/26/2018 05:45 AM    Wt Readings from Last 3 Encounters:  03/08/19 124 lb (56.2 kg)  03/01/19 124 lb (56.2 kg)  02/17/19 121 lb (54.9 kg)     Objective:    Vital Signs:  BP 120/70   Pulse 78   Ht 5\' 2"  (1.575 m)   Wt 124 lb (56.2 kg)   BMI 22.68 kg/m    VITAL SIGNS:  reviewed  ASSESSMENT & PLAN:    1.  Paroxysmal atrial fibrillation:  Symptomatically stable on Toprol-XL 50 mg twice daily.  Continue apixaban 2.5 mg twice daily for systemic anticoagulation.  2.  Hypertension: Blood pressure is normal.  No changes to therapy.  3.  CVA: She suffered a CVA in August 2020.  She is on aspirin and rosuvastatin.  She is also on low-dose Eliquis for paroxysmal atrial fibrillation.  4.  Aortic regurgitation: Mild to moderate in severity by echocardiogram in September 2020.  I will monitor.  5.  Dyspnea on exertion: She has been evaluated by pulmonary who has scheduled PFTs and a noncontrast chest CT to evaluate for interstitial lung disease. She does have rheumatoid arthritis. Nuclear stress test showed no evidence of ischemia on 10/28/2018.    COVID-19 Education: The signs and symptoms of COVID-19 were discussed with the patient and how to seek care for testing (follow up with PCP or arrange E-visit).  The importance of social distancing was discussed today.  Time:   Today, I have spent 15 minutes with the patient with telehealth technology discussing the above problems.     Medication Adjustments/Labs and Tests Ordered: Current medicines are reviewed at length with the patient today.  Concerns regarding medicines are outlined above.   Tests Ordered: No orders of the defined types were placed in this encounter.   Medication Changes: No orders of the defined types were placed in this encounter.   Follow Up:  Virtual Visit  in 6 month(s)  Signed, Kate Sable, MD  03/08/2019 10:58 AM    [place]

## 2019-03-12 DIAGNOSIS — I480 Paroxysmal atrial fibrillation: Secondary | ICD-10-CM | POA: Diagnosis not present

## 2019-03-12 DIAGNOSIS — E7849 Other hyperlipidemia: Secondary | ICD-10-CM | POA: Diagnosis not present

## 2019-04-06 ENCOUNTER — Telehealth: Payer: Self-pay | Admitting: *Deleted

## 2019-04-06 NOTE — Telephone Encounter (Signed)
Pt notified that pt assistance Eliquis has arrived in the office and is ready for pick up.

## 2019-04-11 DIAGNOSIS — G894 Chronic pain syndrome: Secondary | ICD-10-CM | POA: Diagnosis not present

## 2019-04-11 DIAGNOSIS — Z1389 Encounter for screening for other disorder: Secondary | ICD-10-CM | POA: Diagnosis not present

## 2019-04-11 DIAGNOSIS — N390 Urinary tract infection, site not specified: Secondary | ICD-10-CM | POA: Diagnosis not present

## 2019-04-11 DIAGNOSIS — Z6822 Body mass index (BMI) 22.0-22.9, adult: Secondary | ICD-10-CM | POA: Diagnosis not present

## 2019-04-15 ENCOUNTER — Other Ambulatory Visit: Payer: Self-pay | Admitting: Adult Health

## 2019-04-18 DIAGNOSIS — N390 Urinary tract infection, site not specified: Secondary | ICD-10-CM | POA: Diagnosis not present

## 2019-04-18 DIAGNOSIS — N342 Other urethritis: Secondary | ICD-10-CM | POA: Diagnosis not present

## 2019-04-19 ENCOUNTER — Telehealth: Payer: Self-pay | Admitting: Cardiovascular Disease

## 2019-04-19 DIAGNOSIS — N390 Urinary tract infection, site not specified: Secondary | ICD-10-CM | POA: Diagnosis not present

## 2019-04-19 MED ORDER — METOPROLOL SUCCINATE ER 25 MG PO TB24: 75.0000 mg | ORAL_TABLET | Freq: Every day | ORAL | 11 refills | Status: DC

## 2019-04-19 NOTE — Telephone Encounter (Signed)
Patient has [facility] [employer] Medicare, may need APP to prescribe. If she needs to continue on this medication, please call in to [facility] on [address]. Leave detailed message on machine if no one picks up at her home.

## 2019-04-19 NOTE — Telephone Encounter (Signed)
75 mg daily is fine.

## 2019-04-19 NOTE — Telephone Encounter (Signed)
Called pt to clarify how she is taking Toprol. No answer. LMTCB.

## 2019-04-19 NOTE — Telephone Encounter (Signed)
Pt reports taking Toprol XL 75 mg Daily. MD  From 02/16/19  note has pt to take 50 mg Toprol XL BID please clarify.

## 2019-04-19 NOTE — Telephone Encounter (Signed)
Refill sent pt notified

## 2019-05-04 ENCOUNTER — Other Ambulatory Visit: Payer: Self-pay

## 2019-05-04 ENCOUNTER — Ambulatory Visit (HOSPITAL_COMMUNITY)
Admission: RE | Admit: 2019-05-04 | Discharge: 2019-05-04 | Disposition: A | Discharge status: Home / Self Care | Payer: Medicare Other | Source: Ambulatory Visit | Attending: Internal Medicine | Admitting: Internal Medicine

## 2019-05-04 DIAGNOSIS — R0602 Shortness of breath: Secondary | ICD-10-CM | POA: Diagnosis not present

## 2019-05-04 DIAGNOSIS — J479 Bronchiectasis, uncomplicated: Secondary | ICD-10-CM | POA: Diagnosis not present

## 2019-05-04 DIAGNOSIS — J849 Interstitial pulmonary disease, unspecified: Secondary | ICD-10-CM

## 2019-05-05 ENCOUNTER — Other Ambulatory Visit (HOSPITAL_COMMUNITY): Payer: Medicare Other

## 2019-05-05 ENCOUNTER — Other Ambulatory Visit (HOSPITAL_COMMUNITY)
Admission: RE | Admit: 2019-05-05 | Discharge: 2019-05-05 | Disposition: A | Discharge status: Home / Self Care | Payer: Medicare Other | Source: Ambulatory Visit | Attending: Internal Medicine | Admitting: Internal Medicine

## 2019-05-05 DIAGNOSIS — Z20822 Contact with and (suspected) exposure to covid-19: Secondary | ICD-10-CM | POA: Insufficient documentation

## 2019-05-05 DIAGNOSIS — Z01812 Encounter for preprocedural laboratory examination: Secondary | ICD-10-CM | POA: Diagnosis not present

## 2019-05-06 ENCOUNTER — Other Ambulatory Visit (HOSPITAL_COMMUNITY): Payer: Medicare Other

## 2019-05-06 LAB — SARS CORONAVIRUS 2 (TAT 6-24 HRS): SARS Coronavirus 2: NEGATIVE

## 2019-05-10 ENCOUNTER — Ambulatory Visit (INDEPENDENT_AMBULATORY_CARE_PROVIDER_SITE_OTHER): Payer: Medicare Other | Admitting: Internal Medicine

## 2019-05-10 ENCOUNTER — Other Ambulatory Visit: Payer: Self-pay

## 2019-05-10 DIAGNOSIS — Z5329 Procedure and treatment not carried out because of patient's decision for other reasons: Secondary | ICD-10-CM

## 2019-05-10 DIAGNOSIS — J849 Interstitial pulmonary disease, unspecified: Secondary | ICD-10-CM

## 2019-05-10 DIAGNOSIS — R0602 Shortness of breath: Secondary | ICD-10-CM

## 2019-05-10 DIAGNOSIS — M069 Rheumatoid arthritis, unspecified: Secondary | ICD-10-CM | POA: Diagnosis not present

## 2019-05-10 LAB — PULMONARY FUNCTION TEST
DL/VA % pred: 130 %
DL/VA: 5.35 ml/min/mmHg/L
DLCO cor % pred: 89 %
DLCO cor: 15.58 ml/min/mmHg
DLCO unc % pred: 89 %
DLCO unc: 15.58 ml/min/mmHg
FEF 25-75 Post: 2.57 L/sec
FEF 25-75 Pre: 2.31 L/sec
FEF2575-%Change-Post: 11 %
FEF2575-%Pred-Post: 225 %
FEF2575-%Pred-Pre: 202 %
FEV1-%Change-Post: 9 %
FEV1-%Pred-Post: 92 %
FEV1-%Pred-Pre: 84 %
FEV1-Post: 1.53 L
FEV1-Pre: 1.4 L
FEV1FVC-%Change-Post: 0 %
FEV1FVC-%Pred-Pre: 123 %
FEV6-%Change-Post: 8 %
FEV6-%Pred-Post: 80 %
FEV6-%Pred-Pre: 73 %
FEV6-Post: 1.68 L
FEV6-Pre: 1.55 L
FEV6FVC-%Pred-Post: 106 %
FEV6FVC-%Pred-Pre: 106 %
FVC-%Change-Post: 8 %
FVC-%Pred-Post: 75 %
FVC-%Pred-Pre: 69 %
FVC-Post: 1.68 L
FVC-Pre: 1.55 L
Post FEV1/FVC ratio: 91 %
Post FEV6/FVC ratio: 100 %
Pre FEV1/FVC ratio: 90 %
Pre FEV6/FVC Ratio: 100 %
RV % pred: 75 %
RV: 1.77 L
TLC % pred: 73 %
TLC: 3.51 L

## 2019-05-10 NOTE — Progress Notes (Signed)
Full PFT performed today. °

## 2019-05-10 NOTE — Progress Notes (Addendum)
[name]    RE:[id number]    1934-01-12  Primary Care Physician:Fusco, Purcell Nails, MD Date of Appointment: 05/10/2019 Established Patient Visit  I connected with  [name] on 05/10/19 by a video enabled telemedicine application and verified that I am speaking with the correct person using two identifiers. Patient is at home, physician is at office.   I discussed the limitations of evaluation and management by telemedicine. The patient expressed understanding and agreed to proceed.   Chief complaint:  Short of breath   HPI: [name] is a 86 y.o. with rheumatoid arthritis with primarily joint involvement in her hands who is managed on leflunomide.  Initially presented to see me with shortness of breath.  Interval Updates: Since her last visit she has had pulmonary function testing which demonstrates mild restriction to ventilation, as well as CT chest which demonstrates very early fine fibrosis and subpleural reticulation which could be concerning with an early UIP pattern.  She still gets short of breath with exertion.  She has not been monitoring her oxygen saturations at home.  Her joint involvement feels well controlled.  She sees Dr. Dossie Der in [facility] medical for her rheumatology.  I have reviewed the patient's family social and past medical history and updated as appropriate.   Past Medical History:  Diagnosis Date  . A-fib ([place])   . Atrial fibrillation ([place])   . Cervicalgia   . Colon cancer ([place])   . Degeneration of cervical intervertebral disc   . Hypertension   . Pinched nerve in shoulder   . Rheumatoid arthritis(714.0)   . RUQ pain   . Vulvar dystrophy   . Yeast infection     Past Surgical History:  Procedure Laterality Date  . ABDOMINAL HYSTERECTOMY  1960'S  . APPENDECTOMY  with hysterectomy  . BACK SURGERY  12/08/10   plate/screws C3 C4  . CHOLECYSTECTOMY  2002  . COLONOSCOPY  04/02/2009  . COLONOSCOPY  06/04/05  .  COLONOSCOPY N/A 07/26/2015   Procedure: COLONOSCOPY;  Surgeon: Rogene Houston, MD;  [facility]: [facility];  Service: Endoscopy;  Laterality: N/A;  1240  . COLONOSCOPY WITH ESOPHAGOGASTRODUODENOSCOPY (EGD) N/A 04/08/2012   Procedure: COLONOSCOPY WITH ESOPHAGOGASTRODUODENOSCOPY (EGD);  Surgeon: Rogene Houston, MD;  Location: [facility];  Service: Endoscopy;  Laterality: N/A;  130-moved to 245 Ann notified pt  . ESOPHAGOGASTRODUODENOSCOPY N/A 07/26/2015   Procedure: ESOPHAGOGASTRODUODENOSCOPY (EGD);  Surgeon: Rogene Houston, MD;  Location: [facility];  Service: Endoscopy;  Laterality: N/A;  . HEMICOLECTOMY  10/05    Family History  Problem Relation Age of Onset  . Rheum arthritis Mother   . Parkinsonism Father   . Hiatal hernia Sister   . Healthy Brother   . Healthy Daughter   . Healthy Son   . Healthy Son     Social History   Occupational History  . Not on file  Tobacco Use  . Smoking status: Never Smoker  . Smokeless tobacco: Never Used  Substance and Sexual Activity  . Alcohol use: No  . Drug use: No  . Sexual activity: Never    Review of systems: Constitutional: No fevers, chills, night sweats, or weight loss. CV: No chest pain, or palpitations. Resp: No hemoptysis.  Physical Exam: There were no vitals taken for this visit. Not performed due to nature of visit.  She is able to complete full sentences.  Data Reviewed: Imaging: I have personally reviewed the CT  chest from April 2020 which demonstrates mild bibasilar fine fibrosis, subpleural reticulation, small centrilobular nodules.  There is no pneumonia no honeycombing PFTs:  PFT Results Latest Ref Rng & Units 05/10/2019  FVC-Pre L 1.55  FVC-Predicted Pre % 69  FVC-Post L 1.68  FVC-Predicted Post % 75  Pre FEV1/FVC % % 90  Post FEV1/FCV % % 91  FEV1-Pre L 1.40  FEV1-Predicted Pre % 84  FEV1-Post L 1.53  DLCO UNC% % 89  DLCO COR %Predicted % 130  TLC L 3.51  TLC % Predicted % 73  RV % Predicted %  75   I have personally reviewed the patient's PFTs and mild obstruction to ventilation.  Moderate diffusion limitation.  Labs:  Immunization status: Immunization History  Administered Date(s) Administered  . Influenza, High Dose Seasonal PF 10/17/2018  . Influenza-Unspecified 10/12/2012, 10/06/2017  . Moderna SARS-COVID-2 Vaccination 02/26/2019, 03/26/2019  . PPD Test 08/31/2018  . Pneumococcal Conjugate-13 06/19/2014    Assessment:  Interstitial lung disease Rheumatoid arthritis   Plan/Recommendations: Ms. [name] has probable UIP secondary to her rheumatoid arthritis.  Her joint symptoms are currently controlled on leflunomide.  She sees Dr. Dossie Der for rheumatology at [facility] group.  We discussed that since her CT findings are mild and her pulmonary function testing shows very mild restriction and she does not have any desaturation, we can be conservative for management at this time.  Would continue with leflunomide for now.  I will see her back in 6 months with repeat spirometry.  She will let us know if she has worsening breathing difficulties until then.  Would consider treatment of antifibrotic therapy versus changing her immunosuppression should she have progression of disease radiographically and on pulmonary function testing.   I spent 25 minutes on 05/10/2019 in care of this patient including face to face time and non-face to face time spent charting, review of outside records, and coordination of care.   Return to Care: Return in about 6 months (around 11/09/2019).   Lenice Llamas, MD Pulmonary and [facility]

## 2019-05-10 NOTE — Progress Notes (Signed)
Rescheduled. No charge

## 2019-05-26 DIAGNOSIS — M810 Age-related osteoporosis without current pathological fracture: Secondary | ICD-10-CM | POA: Diagnosis not present

## 2019-05-26 DIAGNOSIS — M069 Rheumatoid arthritis, unspecified: Secondary | ICD-10-CM | POA: Diagnosis not present

## 2019-06-07 ENCOUNTER — Ambulatory Visit: Payer: Medicare Other | Admitting: Urology

## 2019-06-27 ENCOUNTER — Telehealth: Payer: Self-pay | Admitting: *Deleted

## 2019-06-27 NOTE — Telephone Encounter (Signed)
Pt notified that pt assistance Eliquis has arrived in office.

## 2019-07-07 DIAGNOSIS — G894 Chronic pain syndrome: Secondary | ICD-10-CM | POA: Diagnosis not present

## 2019-07-25 ENCOUNTER — Other Ambulatory Visit (INDEPENDENT_AMBULATORY_CARE_PROVIDER_SITE_OTHER): Payer: Self-pay | Admitting: Internal Medicine

## 2019-08-23 ENCOUNTER — Ambulatory Visit (INDEPENDENT_AMBULATORY_CARE_PROVIDER_SITE_OTHER): Payer: Medicare Other | Admitting: Gastroenterology

## 2019-08-28 ENCOUNTER — Telehealth: Payer: Self-pay | Admitting: Internal Medicine

## 2019-08-28 NOTE — Telephone Encounter (Signed)
Can't solve this over the phone but since it's been going on for months needs next opening me or NP with all meds in hand and if worse in meantime to er

## 2019-08-28 NOTE — Telephone Encounter (Signed)
Called and spoke with pt who states she has had increased SOB x3 months which is gradually becoming worse.  Pt states the SOB is worse when she is talking. Pt is mainly in a wheelchair but states if she does any activity, SOB becomes worse.  Pt denies any complaints of fever, no cough, no chest tightness. Pt does have sinus drainage which she has had for years.  Pt does have a follow up scheduled with Dr. Shearon Stalls 9/15.  Pt wants recommendations to help with symptoms. Sending to Dr. Melvyn Novas who is covering for Dr. Shearon Stalls today. Dr. Melvyn Novas, please advise.

## 2019-08-28 NOTE — Telephone Encounter (Signed)
Called and spoke with pt letting her know the info stated by MW and that we need to get her scheduled for an appt. Pt verbalized understanding and has been scheduled for an appt with Aaron Edelman 8/19. Nothing further needed.

## 2019-08-31 ENCOUNTER — Other Ambulatory Visit: Payer: Self-pay

## 2019-08-31 ENCOUNTER — Ambulatory Visit: Payer: Medicare Other | Admitting: Pulmonary Disease

## 2019-08-31 ENCOUNTER — Ambulatory Visit (INDEPENDENT_AMBULATORY_CARE_PROVIDER_SITE_OTHER): Payer: Medicare Other

## 2019-08-31 ENCOUNTER — Encounter: Payer: Self-pay | Admitting: Pulmonary Disease

## 2019-08-31 VITALS — BP 118/62 | HR 62 | Temp 98.1°F | Ht 62.0 in | Wt 123.2 lb

## 2019-08-31 DIAGNOSIS — R0602 Shortness of breath: Secondary | ICD-10-CM | POA: Insufficient documentation

## 2019-08-31 DIAGNOSIS — M069 Rheumatoid arthritis, unspecified: Secondary | ICD-10-CM | POA: Diagnosis not present

## 2019-08-31 DIAGNOSIS — J849 Interstitial pulmonary disease, unspecified: Secondary | ICD-10-CM

## 2019-08-31 DIAGNOSIS — J8489 Other specified interstitial pulmonary diseases: Secondary | ICD-10-CM | POA: Diagnosis not present

## 2019-08-31 MED ORDER — PREDNISONE 10 MG PO TABS: ORAL_TABLET | ORAL | 0 refills | Status: AC

## 2019-08-31 NOTE — Assessment & Plan Note (Signed)
Probable UIP on high-resolution CT chest Suspect with increased RA flares patient may have progression of ILD  Plan: Chest x-ray today Keep follow-up with rheumatology Prednisone taper today Keep follow-up in September with spirometry with DLCO

## 2019-08-31 NOTE — Progress Notes (Signed)
@Patient  ID: [name], female    DOB: 02-Sep-1933, 86 y.o.   MRN: [id number]  Chief Complaint  Patient presents with   Follow-up    pt is sob when doing activities    Referring provider: [name], MD  HPI:  86 year old female never smoker followed in our office for interstitial lung disease -connective tissue ILD  PMH: Bronchiectasis, A. fib, chronic pain, history of CVA, rheumatoid arthritis, GERD Smoker/ Smoking History: Never smoker Maintenance: None Pt of: Dr. Shearon Stalls  08/31/2019  - Visit   86 year old female never smoker followed in our office for interstitial lung disease.  Felt to be secondary to patient's rheumatoid arthritis.  She is followed by Dr. Shearon Stalls.  She is managed by Dr. Dossie Der for rheumatology [facility].  She has mild restriction on pulmonary function testing.  Plan of care after last office visit in April/2021 was for the patient to present back in October/2021 with spirometry with Dr. Shearon Stalls.  Dr. Shearon Stalls stated that she would weigh the options of starting antifibrotic's or change in immunosuppression should she have progression of disease radiographically or pulmonary function testing.  Patient presenting to office today due to acute worsening shortness of breath patient contacted our office reporting that her shortness of breath is been getting worse over the last 3 months.  Patient reporting that her breathing episodes also worsen with exertion.  She is also been struggling with ongoing rheumatoid arthritis pain.  She has an upcoming appointment with rheumatology later on this month.  She reports adherence to her medications.  She feels that she has had more RA flares over the last 3 to 4 months as well.  She has not had any rounds of steroids.  She denies any audible wheezing or productive cough or recent antibiotic use.   Questionaires / Pulmonary Flowsheets:   ACT:  No flowsheet data found.  MMRC: mMRC Dyspnea Scale mMRC Score    05/10/2019 4  03/01/2019 3    Epworth:  No flowsheet data found.  Tests:   05/04/2019-CT chest high-res-appearance of lungs is compatible with interstitial lung disease with spectrum of findings considered probable for UIP, aortic arthrosclerosis  05/10/2019-pulmonary function testing-FVC 1.55 (69% predicted), postbronchodilator ratio 91, postbronchodilator FEV1 1.53 (92% predicted), no bronchodilator response, TLC 3.51 (73% predicted), DLCO 15.58 (89% predicted)  FENO:  No results found for: NITRICOXIDE  PFT: PFT Results Latest Ref Rng & Units 05/10/2019  FVC-Pre L 1.55  FVC-Predicted Pre % 69  FVC-Post L 1.68  FVC-Predicted Post % 75  Pre FEV1/FVC % % 90  Post FEV1/FCV % % 91  FEV1-Pre L 1.40  FEV1-Predicted Pre % 84  FEV1-Post L 1.53  DLCO uncorrected ml/min/mmHg 15.58  DLCO UNC% % 89  DLCO corrected ml/min/mmHg 15.58  DLCO COR %Predicted % 89  DLVA Predicted % 130  TLC L 3.51  TLC % Predicted % 73  RV % Predicted % 75    WALK:  SIX MIN WALK 03/01/2019  Supplimental Oxygen during Test? (L/min) No  Tech Comments: Patient was able only to complete 1/4 of the lap due to leg weakness and unsteadyness. No SOB during walk but she was very fatigued because she does not walk a lot at home. No O2 needed during or after walk.    Imaging: No results found.  Lab Results:  CBC    Component Value Date/Time   WBC 5.5 02/17/2019 1549   RBC 3.62 (L) 02/17/2019 1549   HGB 10.2 (L) 02/17/2019 1656  HCT 30.0 (L) 02/17/2019 1656   PLT 171 02/17/2019 1549   MCV 94.5 02/17/2019 1549   MCH 29.8 02/17/2019 1549   MCHC 31.6 02/17/2019 1549   RDW 15.5 02/17/2019 1549   LYMPHSABS 1.2 02/17/2019 1549   MONOABS 0.7 02/17/2019 1549   EOSABS 0.3 02/17/2019 1549   BASOSABS 0.1 02/17/2019 1549    BMET    Component Value Date/Time   NA 140 02/17/2019 1656   K 4.3 02/17/2019 1656   CL 106 02/17/2019 1656   CO2 27 09/05/2018 0738   GLUCOSE 101 (H) 02/17/2019 1656   BUN 23  02/17/2019 1656   CREATININE 0.70 02/17/2019 1656   CREATININE 0.65 08/10/2013 1407   CALCIUM 8.3 (L) 09/05/2018 0738   GFRNONAA >60 09/05/2018 0738   GFRNONAA 85 08/10/2013 1407   GFRAA >60 09/05/2018 0738   GFRAA >89 08/10/2013 1407    BNP    Component Value Date/Time   BNP 298.0 (H) 08/22/2018 1013    ProBNP No results found for: PROBNP  Specialty Problems      Pulmonary Problems   BRONCHIECTASIS    Qualifier: Diagnosis of  By: Joya Gaskins MD, Burnett Harry       Bronchitis   Hypoxia   RAD (reactive airway disease) with wheezing, mild intermittent, with acute exacerbation   Acute respiratory failure with hypoxia (HCC)   CAP (community acquired pneumonia)   ILD (interstitial lung disease) ([place])   Shortness of breath      Allergies  Allergen Reactions   Iodinated Diagnostic Agents Anaphylaxis    08-12-10 Pt had severe reaction with itching/hives/loss of consciousness within 5 minutes of CEPI today.  She received both Kenalog and Iondinated Contrast, so not certain which agent caused reaction.  If patient returns for another CEPI, Dr. Jobe Igo considers using Decadron and a 13-hour prep.  jkl   Nerve Block Tray Anaphylaxis    Patient states that she was sent to have Epidural for her shoulder. After rec'ving she remembers saying that she was itching all over  And that's it. She was transported to hospital  By EMS. Stayed overnight.   Nitrofuran Derivatives Hives and Nausea And Vomiting   Triamcinolone Acetonide Anaphylaxis    08-12-10  Pt had severe reaction with itching/hives/loss of consciousness within five minutes of CEPI today.  She received both Kenalog and Iodinated Contrast, so not certain which agent caused reaction.  If pt returns for another cervical epidural steroid injection, Dr. Jobe Igo suggests using Decadron and a 13-hr prep.  JKL PATIENT USES PROCTOCREAM 2.5% WITHOUT DIFFICULTY FOR VULVAR DYSTROPHY Patient says she can take prednisone without any problem    Vibra-Tab [Doxycycline] Nausea And Vomiting    Immunization History  Administered Date(s) Administered   Influenza, High Dose Seasonal PF 10/17/2018   Influenza-Unspecified 10/12/2012, 10/06/2017   Moderna SARS-COVID-2 Vaccination 02/26/2019, 03/26/2019   PPD Test 08/31/2018   Pneumococcal Conjugate-13 06/19/2014    Past Medical History:  Diagnosis Date   A-fib [facility])    Atrial fibrillation (HCC)    Cervicalgia    Colon cancer ([place])    Degeneration of cervical intervertebral disc    Hypertension    Pinched nerve in shoulder    Rheumatoid arthritis(714.0)    RUQ pain    Vulvar dystrophy    Yeast infection     Tobacco History: Social History   Tobacco Use  Smoking Status Never Smoker  Smokeless Tobacco Never Used   Counseling given: Not Answered   Continue to not smoke  Outpatient  Encounter Medications as of 08/31/2019  Medication Sig   acetaminophen (TYLENOL) 325 MG tablet Take 2 tablets (650 mg total) by mouth every 6 (six) hours as needed for mild pain or headache (or Fever >/= 101).   apixaban (ELIQUIS) 2.5 MG TABS tablet Take 1 tablet (2.5 mg total) by mouth 2 (two) times daily.   aspirin 81 MG chewable tablet Chew 1 tablet (81 mg total) by mouth daily.   cholecalciferol (VITAMIN D3) 25 MCG (1000 UT) tablet Take 2,000 Units by mouth 2 (two) times daily.    HYDROcodone-acetaminophen (NORCO) 7.5-325 MG tablet Take 1 tablet by mouth 3 (three) times daily as needed for moderate pain.   LASTACAFT 0.25 % SOLN Apply 1 drop to eye 2 (two) times daily.   leflunomide (ARAVA) 20 MG tablet Take 1 tablet (20 mg total) by mouth daily.   meclizine (ANTIVERT) 12.5 MG tablet Take 1 tablet (12.5 mg total) by mouth 2 (two) times daily as needed for dizziness.   metoprolol succinate (TOPROL-XL) 25 MG 24 hr tablet Take 3 tablets (75 mg total) by mouth daily.   omeprazole (PRILOSEC) 20 MG capsule TAKE 1 CAPSULE BY MOUTH TWICE DAILY   rosuvastatin (CRESTOR) 20  MG tablet Take 1 tablet (20 mg total) by mouth daily at 6 PM.   predniSONE (DELTASONE) 10 MG tablet Take 4 tablets (40 mg total) by mouth daily with breakfast for 3 days, THEN 3 tablets (30 mg total) daily with breakfast for 3 days, THEN 2 tablets (20 mg total) daily with breakfast for 3 days, THEN 1 tablet (10 mg total) daily with breakfast for 3 days.   No facility-administered encounter medications on file as of 08/31/2019.     Review of Systems  Review of Systems  Constitutional: Positive for fatigue. Negative for activity change and fever.  HENT: Negative for sinus pressure, sinus pain and sore throat.   Respiratory: Positive for shortness of breath. Negative for cough and wheezing.   Cardiovascular: Negative for chest pain and palpitations.  Gastrointestinal: Negative for diarrhea, nausea and vomiting.  Musculoskeletal: Positive for gait problem. Negative for arthralgias.  Neurological: Positive for weakness. Negative for dizziness.  Psychiatric/Behavioral: Negative for sleep disturbance. The patient is not nervous/anxious.      Physical Exam  BP 118/62 (BP Location: Right Arm, Patient Position: Sitting, Cuff Size: Normal)    Pulse 62    Temp 98.1 F (36.7 C) (Oral)    Ht 5\' 2"  (1.575 m)    Wt 123 lb 3.2 oz (55.9 kg)    SpO2 93%    BMI 22.53 kg/m   Wt Readings from Last 5 Encounters:  08/31/19 123 lb 3.2 oz (55.9 kg)  03/08/19 124 lb (56.2 kg)  03/01/19 124 lb (56.2 kg)  02/17/19 121 lb (54.9 kg)  10/24/18 120 lb 3.2 oz (54.5 kg)    BMI Readings from Last 5 Encounters:  08/31/19 22.53 kg/m  03/08/19 22.68 kg/m  03/01/19 22.68 kg/m  02/17/19 22.13 kg/m  10/24/18 21.98 kg/m     Physical Exam Vitals and nursing note reviewed.  Constitutional:      General: She is not in acute distress.    Appearance: Normal appearance.  HENT:     Head: Normocephalic and atraumatic.     Right Ear: External ear normal.     Left Ear: External ear normal.     Nose: Nose normal.  No congestion.     Mouth/Throat:     Mouth: Mucous membranes are moist.  Pharynx: Oropharynx is clear.  Eyes:     Pupils: Pupils are equal, round, and reactive to light.  Cardiovascular:     Rate and Rhythm: Normal rate and regular rhythm.     Pulses: Normal pulses.     Heart sounds: Normal heart sounds. No murmur heard.   Pulmonary:     Breath sounds: No decreased air movement. Rales (Bibasilar) present. No decreased breath sounds or wheezing.     Comments: Scattered squeaks, panting at times with extended talking Musculoskeletal:     Cervical back: Normal range of motion.  Skin:    General: Skin is warm and dry.     Capillary Refill: Capillary refill takes less than 2 seconds.  Neurological:     General: No focal deficit present.     Mental Status: She is alert and oriented to person, place, and time. Mental status is at baseline.     Motor: Weakness present.     Gait: Gait abnormal.  Psychiatric:        Mood and Affect: Mood normal.        Behavior: Behavior normal.        Thought Content: Thought content normal.        Judgment: Judgment normal.       Assessment & Plan:   ILD (interstitial lung disease) ([place]) Probable UIP on high-resolution CT chest Suspect with increased RA flares patient may have progression of ILD  Plan: Chest x-ray today Keep follow-up with rheumatology Prednisone taper today Keep follow-up in September with spirometry with DLCO   Rheumatoid arthritis ([place]) Plan: Keep follow-up with rheumatology this month  Shortness of breath Suspect ILD flare  Plan: Prednisone taper today Keep follow-up with rheumatology Chest x-ray today Close follow-up with our office in September/2021 with spirometry with DLCO    Return in about 4 weeks (around 09/28/2019), or if symptoms worsen or fail to improve, for Follow up with Wyn Quaker FNP-C, Follow up with Dr. Shearon Stalls.   Lauraine Rinne, NP 08/31/2019   This appointment required 35 minutes of  patient care (this includes precharting, chart review, review of results, face-to-face care, etc.).

## 2019-08-31 NOTE — Assessment & Plan Note (Signed)
Suspect ILD flare  Plan: Prednisone taper today Keep follow-up with rheumatology Chest x-ray today Close follow-up with our office in September/2021 with spirometry with DLCO

## 2019-08-31 NOTE — Assessment & Plan Note (Signed)
Plan: Keep follow-up with rheumatology this month

## 2019-08-31 NOTE — Patient Instructions (Addendum)
You were seen today by Brian P Mack, NP  for:   1. ILD (interstitial lung disease) (HCC)  - DG Chest 2 View; Future - predniSONE (DELTASONE) 10 MG tablet; Take 4 tablets (40 mg total) by mouth daily with breakfast for 3 days, THEN 3 tablets (30 mg total) daily with breakfast for 3 days, THEN 2 tablets (20 mg total) daily with breakfast for 3 days, THEN 1 tablet (10 mg total) daily with breakfast for 3 days.  Dispense: 30 tablet; Refill: 0  Schedule 30 min spirometry with dlco   2. Rheumatoid arthritis involving multiple sites, unspecified whether rheumatoid factor present (HCC)  Keep follow up with Rheumatology   3. Shortness of breath  - CBC with Differential/Platelet; Future - Comp Met (CMET); Future - predniSONE (DELTASONE) 10 MG tablet; Take 4 tablets (40 mg total) by mouth daily with breakfast for 3 days, THEN 3 tablets (30 mg total) daily with breakfast for 3 days, THEN 2 tablets (20 mg total) daily with breakfast for 3 days, THEN 1 tablet (10 mg total) daily with breakfast for 3 days.  Dispense: 30 tablet; Refill: 0   We recommend today:  Orders Placed This Encounter  Procedures  . DG Chest 2 View    Standing Status:   Future    Number of Occurrences:   1    Standing Expiration Date:   12/31/2019    Order Specific Question:   Reason for Exam (SYMPTOM  OR DIAGNOSIS REQUIRED)    Answer:   ILD    Order Specific Question:   Preferred imaging location?    Answer:   Internal    Order Specific Question:   Radiology Contrast Protocol - do NOT remove file path    Answer:   [email or web address]  . CBC with Differential/Platelet    Standing Status:   Future    Number of Occurrences:   1    Standing Expiration Date:   08/30/2020  . Comp Met (CMET)    Standing Status:   Future    Number of Occurrences:   1    Standing Expiration Date:   08/30/2020   Orders Placed This Encounter  Procedures  . DG Chest 2 View  . CBC with Differential/Platelet    . Comp Met (CMET)   Meds ordered this encounter  Medications  . predniSONE (DELTASONE) 10 MG tablet    Sig: Take 4 tablets (40 mg total) by mouth daily with breakfast for 3 days, THEN 3 tablets (30 mg total) daily with breakfast for 3 days, THEN 2 tablets (20 mg total) daily with breakfast for 3 days, THEN 1 tablet (10 mg total) daily with breakfast for 3 days.    Dispense:  30 tablet    Refill:  0    Follow Up:    Return in about 4 weeks (around 09/28/2019), or if symptoms worsen or fail to improve, for Follow up with Brian Mack FNP-C, Follow up with Dr. Desai.   Please do your part to reduce the spread of COVID-19:      Reduce your risk of any infection  and COVID19 by using the similar precautions used for avoiding the common cold or flu:  . Wash your hands often with soap and warm water for at least 20 seconds.  If soap and water are not readily available, use an alcohol-based hand sanitizer with at least 60% alcohol.  . If coughing or sneezing, cover your mouth and nose by coughing or sneezing into the elbow   areas of your shirt or coat, into a tissue or into your sleeve (not your hands). Langley Gauss A MASK when in public  . Avoid shaking hands with others and consider head nods or verbal greetings only. . Avoid touching your eyes, nose, or mouth with unwashed hands.  . Avoid close contact with people who are sick. . Avoid places or events with large numbers of people in one location, like concerts or sporting events. . If you have some symptoms but not all symptoms, continue to monitor at home and seek medical attention if your symptoms worsen. . If you are having a medical emergency, call 911.   Micro / e-Visit: [email or web address]         [facility]: [facility]: [phone]                   [facility]: [phone]      It is flu season:   >>> Best ways to protect herself from the flu: Receive the yearly flu vaccine, practice good hand hygiene washing with soap and also using hand sanitizer when available, eat a nutritious meals, get adequate rest, hydrate appropriately   Please contact the office if your symptoms worsen or you have concerns that you are not improving.   Thank you for choosing [facility] for your healthcare, and for allowing [place] to partner with you on your healthcare journey. I am thankful to be able to provide care to you today.   Wyn Quaker FNP-C

## 2019-09-01 LAB — CBC WITH DIFFERENTIAL/PLATELET
Basophils Absolute: 0.1 10*3/uL (ref 0.0–0.1)
Basophils Relative: 1.4 % (ref 0.0–3.0)
Eosinophils Absolute: 0.3 10*3/uL (ref 0.0–0.7)
Eosinophils Relative: 5.2 % — ABNORMAL HIGH (ref 0.0–5.0)
HCT: 37.7 % (ref 36.0–46.0)
Hemoglobin: 12.3 g/dL (ref 12.0–15.0)
Lymphocytes Relative: 24.7 % (ref 12.0–46.0)
Lymphs Abs: 1.4 10*3/uL (ref 0.7–4.0)
MCHC: 32.7 g/dL (ref 30.0–36.0)
MCV: 93.2 fl (ref 78.0–100.0)
Monocytes Absolute: 0.7 10*3/uL (ref 0.1–1.0)
Monocytes Relative: 12 % (ref 3.0–12.0)
Neutro Abs: 3.3 10*3/uL (ref 1.4–7.7)
Neutrophils Relative %: 56.7 % (ref 43.0–77.0)
Platelets: 195 10*3/uL (ref 150.0–400.0)
RBC: 4.05 Mil/uL (ref 3.87–5.11)
RDW: 14.8 % (ref 11.5–15.5)
WBC: 5.8 10*3/uL (ref 4.0–10.5)

## 2019-09-01 LAB — COMPREHENSIVE METABOLIC PANEL
ALT: 9 U/L (ref 0–35)
AST: 19 U/L (ref 0–37)
Albumin: 3.8 g/dL (ref 3.5–5.2)
Alkaline Phosphatase: 38 U/L — ABNORMAL LOW (ref 39–117)
BUN: 20 mg/dL (ref 6–23)
CO2: 29 mEq/L (ref 19–32)
Calcium: 9.2 mg/dL (ref 8.4–10.5)
Chloride: 106 mEq/L (ref 96–112)
Creatinine, Ser: 0.84 mg/dL (ref 0.40–1.20)
GFR: 64.55 mL/min (ref >60.00)
Glucose, Bld: 110 mg/dL — ABNORMAL HIGH (ref 70–99)
Potassium: 3.9 mEq/L (ref 3.5–5.1)
Sodium: 142 mEq/L (ref 135–145)
Total Bilirubin: 0.3 mg/dL (ref 0.2–1.2)
Total Protein: 6.3 g/dL (ref 6.0–8.3)

## 2019-09-04 ENCOUNTER — Telehealth: Payer: Self-pay | Admitting: Internal Medicine

## 2019-09-04 NOTE — Telephone Encounter (Signed)
Spoke with patient she was confused about her appointment time. States that [place] called her to change her appointment. She is now scheduled for breathing test on 9/15 at 4pm and follow up with Dr. Shearon Stalls on 10/05/19 at 4pm. Nothing further needed at this time.

## 2019-09-07 ENCOUNTER — Telehealth: Payer: Medicare Other | Admitting: Student

## 2019-09-07 ENCOUNTER — Telehealth: Payer: Medicare Other | Admitting: Cardiovascular Disease

## 2019-09-12 DIAGNOSIS — M5136 Other intervertebral disc degeneration, lumbar region: Secondary | ICD-10-CM | POA: Diagnosis not present

## 2019-09-12 DIAGNOSIS — Z79899 Other long term (current) drug therapy: Secondary | ICD-10-CM | POA: Diagnosis not present

## 2019-09-12 DIAGNOSIS — M549 Dorsalgia, unspecified: Secondary | ICD-10-CM | POA: Diagnosis not present

## 2019-09-12 DIAGNOSIS — M150 Primary generalized (osteo)arthritis: Secondary | ICD-10-CM | POA: Diagnosis not present

## 2019-09-12 DIAGNOSIS — M0589 Other rheumatoid arthritis with rheumatoid factor of multiple sites: Secondary | ICD-10-CM | POA: Diagnosis not present

## 2019-09-19 ENCOUNTER — Telehealth: Payer: Self-pay | Admitting: *Deleted

## 2019-09-19 NOTE — Telephone Encounter (Signed)
Pt notified that patient assistance Eliquis is in office to be picked up.

## 2019-09-26 ENCOUNTER — Other Ambulatory Visit: Payer: Self-pay

## 2019-09-26 DIAGNOSIS — J849 Interstitial pulmonary disease, unspecified: Secondary | ICD-10-CM

## 2019-09-27 ENCOUNTER — Ambulatory Visit (INDEPENDENT_AMBULATORY_CARE_PROVIDER_SITE_OTHER): Payer: Medicare Other | Admitting: Internal Medicine

## 2019-09-27 ENCOUNTER — Ambulatory Visit: Payer: Medicare Other | Admitting: Internal Medicine

## 2019-09-27 ENCOUNTER — Other Ambulatory Visit: Payer: Self-pay

## 2019-09-27 DIAGNOSIS — J849 Interstitial pulmonary disease, unspecified: Secondary | ICD-10-CM

## 2019-09-27 NOTE — Progress Notes (Signed)
Spirometry with DLCO completed today

## 2019-09-28 ENCOUNTER — Other Ambulatory Visit: Payer: Self-pay

## 2019-09-28 ENCOUNTER — Emergency Department (HOSPITAL_COMMUNITY): Payer: Medicare Other

## 2019-09-28 ENCOUNTER — Encounter (HOSPITAL_COMMUNITY): Payer: Self-pay | Admitting: *Deleted

## 2019-09-28 ENCOUNTER — Inpatient Hospital Stay (HOSPITAL_COMMUNITY)
Admission: EM | Admit: 2019-09-28 | Discharge: 2019-09-30 | DRG: 309 | Disposition: A | Discharge status: Home / Self Care | Payer: Medicare Other | Attending: Internal Medicine | Admitting: Internal Medicine

## 2019-09-28 DIAGNOSIS — M4856XA Collapsed vertebra, not elsewhere classified, lumbar region, initial encounter for fracture: Secondary | ICD-10-CM | POA: Diagnosis not present

## 2019-09-28 DIAGNOSIS — I4892 Unspecified atrial flutter: Secondary | ICD-10-CM | POA: Diagnosis not present

## 2019-09-28 DIAGNOSIS — M549 Dorsalgia, unspecified: Secondary | ICD-10-CM

## 2019-09-28 DIAGNOSIS — Z7982 Long term (current) use of aspirin: Secondary | ICD-10-CM

## 2019-09-28 DIAGNOSIS — I4891 Unspecified atrial fibrillation: Secondary | ICD-10-CM

## 2019-09-28 DIAGNOSIS — I482 Chronic atrial fibrillation, unspecified: Secondary | ICD-10-CM | POA: Diagnosis not present

## 2019-09-28 DIAGNOSIS — Z7901 Long term (current) use of anticoagulants: Secondary | ICD-10-CM

## 2019-09-28 DIAGNOSIS — J849 Interstitial pulmonary disease, unspecified: Secondary | ICD-10-CM | POA: Diagnosis not present

## 2019-09-28 DIAGNOSIS — R531 Weakness: Secondary | ICD-10-CM | POA: Diagnosis not present

## 2019-09-28 DIAGNOSIS — E782 Mixed hyperlipidemia: Secondary | ICD-10-CM | POA: Diagnosis not present

## 2019-09-28 DIAGNOSIS — M2578 Osteophyte, vertebrae: Secondary | ICD-10-CM | POA: Diagnosis not present

## 2019-09-28 DIAGNOSIS — E785 Hyperlipidemia, unspecified: Secondary | ICD-10-CM | POA: Diagnosis not present

## 2019-09-28 DIAGNOSIS — I700 Atherosclerosis of aorta: Secondary | ICD-10-CM | POA: Diagnosis present

## 2019-09-28 DIAGNOSIS — R5381 Other malaise: Secondary | ICD-10-CM

## 2019-09-28 DIAGNOSIS — M069 Rheumatoid arthritis, unspecified: Secondary | ICD-10-CM | POA: Diagnosis present

## 2019-09-28 DIAGNOSIS — Z888 Allergy status to other drugs, medicaments and biological substances status: Secondary | ICD-10-CM

## 2019-09-28 DIAGNOSIS — Z8673 Personal history of transient ischemic attack (TIA), and cerebral infarction without residual deficits: Secondary | ICD-10-CM

## 2019-09-28 DIAGNOSIS — Z85038 Personal history of other malignant neoplasm of large intestine: Secondary | ICD-10-CM | POA: Diagnosis not present

## 2019-09-28 DIAGNOSIS — Z20822 Contact with and (suspected) exposure to covid-19: Secondary | ICD-10-CM | POA: Diagnosis present

## 2019-09-28 DIAGNOSIS — I6782 Cerebral ischemia: Secondary | ICD-10-CM | POA: Diagnosis not present

## 2019-09-28 DIAGNOSIS — M5030 Other cervical disc degeneration, unspecified cervical region: Secondary | ICD-10-CM | POA: Diagnosis present

## 2019-09-28 DIAGNOSIS — Z8261 Family history of arthritis: Secondary | ICD-10-CM

## 2019-09-28 DIAGNOSIS — Z23 Encounter for immunization: Secondary | ICD-10-CM

## 2019-09-28 DIAGNOSIS — M545 Low back pain: Secondary | ICD-10-CM | POA: Diagnosis not present

## 2019-09-28 DIAGNOSIS — Z9049 Acquired absence of other specified parts of digestive tract: Secondary | ICD-10-CM | POA: Diagnosis not present

## 2019-09-28 DIAGNOSIS — I351 Nonrheumatic aortic (valve) insufficiency: Secondary | ICD-10-CM | POA: Diagnosis not present

## 2019-09-28 DIAGNOSIS — Z91041 Radiographic dye allergy status: Secondary | ICD-10-CM

## 2019-09-28 DIAGNOSIS — K219 Gastro-esophageal reflux disease without esophagitis: Secondary | ICD-10-CM | POA: Diagnosis present

## 2019-09-28 DIAGNOSIS — G8929 Other chronic pain: Secondary | ICD-10-CM

## 2019-09-28 DIAGNOSIS — I10 Essential (primary) hypertension: Secondary | ICD-10-CM

## 2019-09-28 DIAGNOSIS — I350 Nonrheumatic aortic (valve) stenosis: Secondary | ICD-10-CM | POA: Diagnosis not present

## 2019-09-28 DIAGNOSIS — R55 Syncope and collapse: Secondary | ICD-10-CM | POA: Diagnosis not present

## 2019-09-28 DIAGNOSIS — I480 Paroxysmal atrial fibrillation: Secondary | ICD-10-CM | POA: Diagnosis not present

## 2019-09-28 LAB — CBC
HCT: 42 % (ref 36.0–46.0)
Hemoglobin: 13 g/dL (ref 12.0–15.0)
MCH: 30 pg (ref 26.0–34.0)
MCHC: 31 g/dL (ref 30.0–36.0)
MCV: 97 fL (ref 80.0–100.0)
Platelets: 138 10*3/uL — ABNORMAL LOW (ref 150–400)
RBC: 4.33 MIL/uL (ref 3.87–5.11)
RDW: 14.7 % (ref 11.5–15.5)
WBC: 4.6 10*3/uL (ref 4.0–10.5)
nRBC: 0 % (ref 0.0–0.2)

## 2019-09-28 LAB — BASIC METABOLIC PANEL
Anion gap: 14 (ref 5–15)
BUN: 15 mg/dL (ref 8–23)
CO2: 26 mmol/L (ref 22–32)
Calcium: 8.4 mg/dL — ABNORMAL LOW (ref 8.9–10.3)
Chloride: 101 mmol/L (ref 98–111)
Creatinine, Ser: 0.75 mg/dL (ref 0.44–1.00)
GFR calc Af Amer: >60 mL/min (ref >60)
GFR calc non Af Amer: >60 mL/min (ref >60)
Glucose, Bld: 90 mg/dL (ref 70–99)
Potassium: 3.6 mmol/L (ref 3.5–5.1)
Sodium: 141 mmol/L (ref 135–145)

## 2019-09-28 LAB — URINALYSIS, ROUTINE W REFLEX MICROSCOPIC
Bacteria, UA: NONE SEEN
Bilirubin Urine: NEGATIVE
Glucose, UA: NEGATIVE mg/dL
Hgb urine dipstick: NEGATIVE
Ketones, ur: NEGATIVE mg/dL
Nitrite: NEGATIVE
Protein, ur: NEGATIVE mg/dL
Specific Gravity, Urine: 1.017 (ref 1.005–1.030)
pH: 5 (ref 5.0–8.0)

## 2019-09-28 LAB — TSH: TSH: 1.075 u[IU]/mL (ref 0.350–4.500)

## 2019-09-28 LAB — SARS CORONAVIRUS 2 BY RT PCR (HOSPITAL ORDER, PERFORMED IN ~~LOC~~ HOSPITAL LAB): SARS Coronavirus 2: NEGATIVE

## 2019-09-28 MED ORDER — ROSUVASTATIN CALCIUM 20 MG PO TABS
20.0000 mg | ORAL_TABLET | Freq: Every day | ORAL | Status: DC
  Filled 2019-09-28 (×3): qty 1

## 2019-09-28 MED ORDER — ACETAMINOPHEN 325 MG PO TABS: 650.0000 mg | ORAL_TABLET | ORAL | Status: DC | PRN

## 2019-09-28 MED ORDER — ACETAMINOPHEN 325 MG PO TABS
650.0000 mg | ORAL_TABLET | Freq: Four times a day (QID) | ORAL | Status: DC | PRN
  Administered 2019-09-29: 650 mg via ORAL
  Filled 2019-09-28: qty 2

## 2019-09-28 MED ORDER — HYDROCODONE-ACETAMINOPHEN 5-325 MG PO TABS
2.0000 | ORAL_TABLET | Freq: Once | ORAL | Status: AC
  Administered 2019-09-28: 2 via ORAL
  Filled 2019-09-28: qty 2

## 2019-09-28 MED ORDER — DILTIAZEM HCL-DEXTROSE 125-5 MG/125ML-% IV SOLN (PREMIX)
5.0000 mg/h | INTRAVENOUS | Status: DC
  Administered 2019-09-28: 5 mg/h via INTRAVENOUS
  Filled 2019-09-28: qty 125

## 2019-09-28 MED ORDER — ONDANSETRON HCL 4 MG/2ML IJ SOLN: 4.0000 mg | Freq: Four times a day (QID) | INTRAMUSCULAR | Status: DC | PRN

## 2019-09-28 MED ORDER — APIXABAN 2.5 MG PO TABS
2.5000 mg | ORAL_TABLET | Freq: Two times a day (BID) | ORAL | Status: DC
  Administered 2019-09-28 – 2019-09-30 (×4): 2.5 mg via ORAL
  Filled 2019-09-28 (×4): qty 1

## 2019-09-28 MED ORDER — VITAMIN D 25 MCG (1000 UNIT) PO TABS
2000.0000 [IU] | ORAL_TABLET | Freq: Two times a day (BID) | ORAL | Status: DC
  Administered 2019-09-28 – 2019-09-30 (×4): 2000 [IU] via ORAL
  Filled 2019-09-28 (×4): qty 2

## 2019-09-28 MED ORDER — PANTOPRAZOLE SODIUM 40 MG PO TBEC
40.0000 mg | DELAYED_RELEASE_TABLET | Freq: Every day | ORAL | Status: DC
  Administered 2019-09-29: 40 mg via ORAL
  Filled 2019-09-28: qty 1

## 2019-09-28 MED ORDER — METOPROLOL TARTRATE 5 MG/5ML IV SOLN
5.0000 mg | Freq: Once | INTRAVENOUS | Status: AC
  Administered 2019-09-28: 5 mg via INTRAVENOUS
  Filled 2019-09-28: qty 5

## 2019-09-28 MED ORDER — DIGOXIN 0.25 MG/ML IJ SOLN
0.2500 mg | Freq: Once | INTRAMUSCULAR | Status: AC
  Administered 2019-09-28: 0.25 mg via INTRAVENOUS
  Filled 2019-09-28: qty 2

## 2019-09-28 MED ORDER — METOPROLOL TARTRATE 5 MG/5ML IV SOLN: 2.5000 mg | INTRAVENOUS | Status: AC

## 2019-09-28 MED ORDER — METOPROLOL SUCCINATE ER 50 MG PO TB24
75.0000 mg | ORAL_TABLET | Freq: Every day | ORAL | Status: DC
  Administered 2019-09-29 – 2019-09-30 (×2): 75 mg via ORAL
  Filled 2019-09-28: qty 1
  Filled 2019-09-28: qty 3

## 2019-09-28 NOTE — ED Triage Notes (Signed)
Pt states she is on Eliquis.

## 2019-09-28 NOTE — H&P (Signed)
TRH H&P   Patient Demographics:    [name], is a 86 y.o. female  MRN: [id number]   DOB - 1933-11-27  Admit Date - 09/28/2019  Outpatient Primary MD for the patient is [name], MD  Referring MD/NP/PA: Dr Sabra Heck   Patient coming from: Home  Chief Complaint  Patient presents with  . Loss of Consciousness      HPI:    [name]  is a 86 y.o. female, with past medical history relevant for chronic atrial fibrillation, HTN,, HLD, rheumatoid arthritis, CVA, L1/L2 fracture status post kyphoplasty, chronic lower back pain. -Patient presents to ED secondary to worsening lower back pain, and some lightheadedness, upon further questioning she does report she had syncope last Friday, reports when she stood up walked out of the room, she felt lightheaded and she lost consciousness and collapsed,, reports this was accompanied by some nausea, she denies any headache, or head trauma, she denies any chest pain, report she is at baseline with exertional dyspnea which has not worsened, but she did report some worsening lower back pain which prompted her to come to ED, she denies any new focal tingling, weakness, deficits, no urinary symptoms, no fever or chills, reports she has been compliant with her medications. - in ED she was noted to be in A. fib with RVR, heart rate in the 140s, she was started on Cardizem drip, heart rate has improved, but this did bring her blood pressure down, her work-up was significant for stable renal function, stable hemoglobin, given she is on Cardizem drip in A. fib with RVR triage hospitalist consulted to admit, by the time of my evaluation, her heart rate has improved to mid 80s, blood pressure was 97/54, so I have instructed them to hold her Cardizem drip.     Review of systems:    In addition to the HPI above,  No Fever-chills, she reports syncope  last Friday No Headache, No changes with Vision or hearing, No problems swallowing food or Liquids, No Chest pain, Cough, she reports exertional dyspnea at baseline No Abdominal pain, No Nausea or Vommitting, Bowel movements are regular, No Blood in stool or Urine, No dysuria, No new skin rashes or bruises, She reports chronic lower back pain, which has worsened recently after her syncope No new weakness, tingling, numbness in any extremity, No recent weight gain or loss, No polyuria, polydypsia or polyphagia, No significant Mental Stressors.  A full 10 point Review of Systems was done, except as stated above, all other Review of Systems were negative.   With Past History of the following :    Past Medical History:  Diagnosis Date  . A-fib ([place])   . Atrial fibrillation ([place])   . Cervicalgia   . Colon cancer ([place])   . Degeneration of cervical intervertebral disc   . Hypertension   . Pinched nerve in shoulder   .  Rheumatoid arthritis(714.0)   . RUQ pain   . Vulvar dystrophy   . Yeast infection       Past Surgical History:  Procedure Laterality Date  . ABDOMINAL HYSTERECTOMY  1960'S  . APPENDECTOMY  with hysterectomy  . BACK SURGERY  12/08/10   plate/screws C3 C4  . CHOLECYSTECTOMY  2002  . COLONOSCOPY  04/02/2009  . COLONOSCOPY  06/04/05  . COLONOSCOPY N/A 07/26/2015   Procedure: COLONOSCOPY;  Surgeon: Rogene Houston, MD;  [facility];  Service: Endoscopy;  Laterality: N/A;  1240  . COLONOSCOPY WITH ESOPHAGOGASTRODUODENOSCOPY (EGD) N/A 04/08/2012   Procedure: COLONOSCOPY WITH ESOPHAGOGASTRODUODENOSCOPY (EGD);  Surgeon: Rogene Houston, MD;  Location: [facility];  Service: Endoscopy;  Laterality: N/A;  130-moved to 245 Ann notified pt  . ESOPHAGOGASTRODUODENOSCOPY N/A 07/26/2015   Procedure: ESOPHAGOGASTRODUODENOSCOPY (EGD);  Surgeon: Rogene Houston, MD;  Location: [facility];  Service: Endoscopy;  Laterality: N/A;  . HEMICOLECTOMY  10/05       Social History:     Social History   Tobacco Use  . Smoking status: Never Smoker  . Smokeless tobacco: Never Used  Substance Use Topics  . Alcohol use: No      Family History :     Family History  Problem Relation Age of Onset  . Rheum arthritis Mother   . Parkinsonism Father   . Hiatal hernia Sister   . Healthy Brother   . Healthy Daughter   . Healthy Son   . Healthy Son       Home Medications:   Prior to Admission medications   Medication Sig Start Date End Date Taking? Authorizing Provider  acetaminophen (TYLENOL) 325 MG tablet Take 2 tablets (650 mg total) by mouth every 6 (six) hours as needed for mild pain or headache (or Fever >/= 101). 08/31/18   Barton Dubois, MD  apixaban (ELIQUIS) 2.5 MG TABS tablet Take 1 tablet (2.5 mg total) by mouth 2 (two) times daily. 09/08/18   Gerlene Fee, NP  aspirin 81 MG chewable tablet Chew 1 tablet (81 mg total) by mouth daily. 09/01/18   Barton Dubois, MD  cholecalciferol (VITAMIN D3) 25 MCG (1000 UT) tablet Take 2,000 Units by mouth 2 (two) times daily.     [provider]  HYDROcodone-acetaminophen (NORCO) 7.5-325 MG tablet Take 1 tablet by mouth 3 (three) times daily as needed for moderate pain. 09/08/18   Gerlene Fee, NP  LASTACAFT 0.25 % SOLN Apply 1 drop to eye 2 (two) times daily. 01/17/19   [provider]  leflunomide (ARAVA) 20 MG tablet Take 1 tablet (20 mg total) by mouth daily. 09/08/18   Gerlene Fee, NP  meclizine (ANTIVERT) 12.5 MG tablet Take 1 tablet (12.5 mg total) by mouth 2 (two) times daily as needed for dizziness. 09/08/18   Gerlene Fee, NP  metoprolol succinate (TOPROL-XL) 25 MG 24 hr tablet Take 3 tablets (75 mg total) by mouth daily. 04/19/19   Herminio Commons, MD  omeprazole (PRILOSEC) 20 MG capsule TAKE 1 CAPSULE BY MOUTH TWICE DAILY 07/25/19   Laurine Blazer A, PA-C  rosuvastatin (CRESTOR) 20 MG tablet Take 1 tablet (20 mg total) by mouth daily at 6 PM. 09/08/18   Nyoka Cowden,  Phylis Bougie, NP     Allergies:     Allergies  Allergen Reactions  . Iodinated Diagnostic Agents Anaphylaxis    08-12-10 Pt had severe reaction with itching/hives/loss of consciousness within 5 minutes of CEPI today.  She received both Kenalog and Iondinated Contrast, so not certain which agent caused reaction.  If patient returns for another CEPI, Dr. Jobe Igo considers using Decadron and a 13-hour prep.  jkl  . Nerve Block Tray Anaphylaxis    Patient states that she was sent to have Epidural for her shoulder. After rec'ving she remembers saying that she was itching all over  And that's it. She was transported to hospital  By EMS. Stayed overnight.  [place] Spikes Derivatives Hives and Nausea And Vomiting  . Triamcinolone Acetonide Anaphylaxis    08-12-10  Pt had severe reaction with itching/hives/loss of consciousness within five minutes of CEPI today.  She received both Kenalog and Iodinated Contrast, so not certain which agent caused reaction.  If pt returns for another cervical epidural steroid injection, Dr. Jobe Igo suggests using Decadron and a 13-hr prep.  JKL PATIENT USES PROCTOCREAM 2.5% WITHOUT DIFFICULTY FOR VULVAR DYSTROPHY Patient says she can take prednisone without any problem  . Vibra-Tab [Doxycycline] Nausea And Vomiting     Physical Exam:   Vitals  Blood pressure 97/65, pulse 75, temperature 98.1 F (36.7 C), temperature source Oral, resp. rate (!) 21, height 5\' 2"  (1.575 m), weight 54.4 kg, SpO2 (!) 89 %.   1. General frail elderly female, laying in bed, in no apparent distress  2. Normal affect and insight, Not Suicidal or Homicidal, Awake Alert, Oriented X 3.  3. No F.N deficits, ALL C.Nerves Intact, Strength 5/5 all 4 extremities, Sensation intact all 4 extremities, Plantars down going.  4. Ears and Eyes appear Normal, Conjunctivae clear, PERRLA. Moist Oral Mucosa.  5. Supple Neck, No JVD, No cervical lymphadenopathy appriciated, No Carotid Bruits.  6. Symmetrical  Chest wall movement, Good air movement bilaterally, scattered rails  7.  Irregular irregular, No Gallops, Rubs or Murmurs, No Parasternal Heave.  8. Positive Bowel Sounds, Abdomen Soft, No tenderness, No organomegaly appriciated,No rebound -guarding or rigidity.  9.  No Cyanosis, Normal Skin Turgor, No Skin Rash or Bruise.  10. Good muscle tone,  joints appear normal , no effusions, Normal ROM.  She has tenderness to palpation in the sacral area  11. No Palpable Lymph Nodes in Neck or Axillae     Data Review:    CBC Recent Labs  Lab 09/28/19 1242  WBC 4.6  HGB 13.0  HCT 42.0  PLT 138*  MCV 97.0  MCH 30.0  MCHC 31.0  RDW 14.7   ------------------------------------------------------------------------------------------------------------------  Chemistries  Recent Labs  Lab 09/28/19 1242  NA 141  K 3.6  CL 101  CO2 26  GLUCOSE 90  BUN 15  CREATININE 0.75  CALCIUM 8.4*   ------------------------------------------------------------------------------------------------------------------ estimated creatinine clearance is 41.4 mL/min (by C-G formula based on SCr of 0.75 mg/dL). ------------------------------------------------------------------------------------------------------------------ No results for input(s): TSH, T4TOTAL, T3FREE, THYROIDAB in the last 72 hours.  Invalid input(s): FREET3  Coagulation profile No results for input(s): INR, PROTIME in the last 168 hours. ------------------------------------------------------------------------------------------------------------------- No results for input(s): DDIMER in the last 72 hours. -------------------------------------------------------------------------------------------------------------------  Cardiac Enzymes No results for input(s): CKMB, TROPONINI, MYOGLOBIN in the last 168 hours.  Invalid input(s):  CK ------------------------------------------------------------------------------------------------------------------    Component Value Date/Time   BNP 298.0 (H) 08/22/2018 1013     ---------------------------------------------------------------------------------------------------------------  Urinalysis    Component Value Date/Time   COLORURINE YELLOW 09/28/2019 1120   APPEARANCEUR CLEAR 09/28/2019 1120   LABSPEC 1.017 09/28/2019 1120   PHURINE 5.0 09/28/2019 1120   GLUCOSEU NEGATIVE 09/28/2019 [place] 09/28/2019 1120   BILIRUBINUR  NEGATIVE 09/28/2019 1120   KETONESUR NEGATIVE 09/28/2019 1120   PROTEINUR NEGATIVE 09/28/2019 1120   UROBILINOGEN 0.2 04/23/2014 1300   NITRITE NEGATIVE 09/28/2019 1120   LEUKOCYTESUR TRACE (A) 09/28/2019 1120    ----------------------------------------------------------------------------------------------------------------   Imaging Results:    DG Lumbar Spine Complete  Result Date: 09/28/2019 CLINICAL DATA:  Status post fall presenting with lower back pain and right leg pain. EXAM: LUMBAR SPINE - COMPLETE 4+ VIEW; SACRUM AND COCCYX - 2+ VIEW COMPARISON:  X-ray lumbar spine 04/20/2017. x-ray fluoro guided kyphoplasty 04/28/2017 report WITHOUT images FINDINGS: Five non-rib-bearing lumbar vertebral bodies. Partial visualization of the sacrum due to overlying bowel. Slight dextrocurvature of the thoracolumbar spine is again noted. Alignment is otherwise normal. Worsened anterior L1 vertebral body compression fracture demonstrating at least 60% (from 20%) height loss with interval kyphoplasty. Stable mild compression fracture of the L2 vertebral body with kyphoplasty at this level again noted. There is no evidence of acute displaced sacral spine fracture with the visualized sacral arcuate lines appearing intact. Multilevel mild intervertebral disc space narrowing. Multilevel degenerative changes of the spine with osteophyte formation and  moderate severe facet arthropathy. Surgical clips and bowel anastomotic sutures overlie the pelvis. Severe atherosclerotic plaque of the aorta. IMPRESSION: 1. Age-indeterminate worsening of an anterior L1 vertebral body compression fracture now demonstrating at least 60% height loss anteriorly with interval kyphoplasty at this level. (Unclear if the worsened compression fracture happened prior or following the new kyphoplasty at this level). 2. Stable mild compression fracture of L2 vertebral body status post kyphoplasty. 3. No acute displaced fracture of the sacral spine. 4. No traumatic listhesis of the lumbosacral spine. Electronically Signed   By: Iven Finn M.D.   On: 09/28/2019 17:43   DG Sacrum/Coccyx  Result Date: 09/28/2019 CLINICAL DATA:  Status post fall presenting with lower back pain and right leg pain. EXAM: LUMBAR SPINE - COMPLETE 4+ VIEW; SACRUM AND COCCYX - 2+ VIEW COMPARISON:  X-ray lumbar spine 04/20/2017. x-ray fluoro guided kyphoplasty 04/28/2017 report WITHOUT images FINDINGS: Five non-rib-bearing lumbar vertebral bodies. Partial visualization of the sacrum due to overlying bowel. Slight dextrocurvature of the thoracolumbar spine is again noted. Alignment is otherwise normal. Worsened anterior L1 vertebral body compression fracture demonstrating at least 60% (from 20%) height loss with interval kyphoplasty. Stable mild compression fracture of the L2 vertebral body with kyphoplasty at this level again noted. There is no evidence of acute displaced sacral spine fracture with the visualized sacral arcuate lines appearing intact. Multilevel mild intervertebral disc space narrowing. Multilevel degenerative changes of the spine with osteophyte formation and moderate severe facet arthropathy. Surgical clips and bowel anastomotic sutures overlie the pelvis. Severe atherosclerotic plaque of the aorta. IMPRESSION: 1. Age-indeterminate worsening of an anterior L1 vertebral body compression  fracture now demonstrating at least 60% height loss anteriorly with interval kyphoplasty at this level. (Unclear if the worsened compression fracture happened prior or following the new kyphoplasty at this level). 2. Stable mild compression fracture of L2 vertebral body status post kyphoplasty. 3. No acute displaced fracture of the sacral spine. 4. No traumatic listhesis of the lumbosacral spine. Electronically Signed   By: Iven Finn M.D.   On: 09/28/2019 17:43   CT Head Wo Contrast  Result Date: 09/28/2019 CLINICAL DATA:  Syncope, simple, normal neuro exam. Additional history provided: Patient reports dizziness, syncope on Friday night. EXAM: CT HEAD WITHOUT CONTRAST TECHNIQUE: Contiguous axial images were obtained from the base of the skull through the vertex without intravenous contrast. COMPARISON:  MRI/MRA head 08/25/2018, head CT 08/25/2018. FINDINGS: Brain Stable, moderate generalized parenchymal atrophy. Known chronic lacunar infarcts within the cerebral white matter, basal ganglia, thalami and cerebellum some of which were better appreciated on the prior MRI of 08/25/2018. Stable background mild ill-defined hypoattenuation within the cerebral white matter and pons which is nonspecific, but consistent with chronic small vessel ischemic disease. There is no acute intracranial hemorrhage. No demarcated cortical infarct. No extra-axial fluid collection. No evidence of intracranial mass. No midline shift. Vascular: No hyperdense vessel.  Atherosclerotic calcifications. Skull: Normal. Negative for fracture or focal lesion. Sinuses/Orbits: Visualized orbits show no acute finding. Frothy secretions, mild mucosal thickening and small air-fluid level within the left maxillary sinus. Frothy secretions and moderate mucosal thickening within the right sphenoid sinus. Mild partial opacification of ethmoid air cells. No significant mastoid effusion. IMPRESSION: No evidence of acute intracranial abnormality.  Known chronic lacunar infarcts within the cerebral white matter, basal ganglia, thalami and cerebellum. Stable background mild chronic small vessel ischemic disease within the cerebral white matter and pons. Stable moderate generalized parenchymal atrophy. Ethmoid, right sphenoid and left maxillary sinusitis. Electronically Signed   By: Kellie Simmering DO   On: 09/28/2019 16:52   DG Chest Port 1 View  Result Date: 09/28/2019 CLINICAL DATA:  Atrial arrhythmia, syncopal episode Friday night EXAM: PORTABLE CHEST 1 VIEW COMPARISON:  Radiograph 08/31/2019, CT 05/04/2019 FINDINGS: Chronically coarsened interstitial and bronchitic features are similar to comparison radiography and suggested to be on the spectrum of UIP on prior high-resolution CT examination. No consolidation, features of edema, pneumothorax, or effusion. The aorta is calcified. The remaining cardiomediastinal contours are unremarkable. No acute osseous or soft tissue abnormality. Degenerative changes are present in the imaged spine and shoulders. Telemetry leads overlie the chest. IMPRESSION: No acute cardiopulmonary abnormality. Chronically coarsened interstitial and bronchitic features are similar to comparison radiography and previously suggested to be on the spectrum of UIP on prior HRCT. Aortic Atherosclerosis (ICD10-I70.0). Electronically Signed   By: Lovena Le M.D.   On: 09/28/2019 16:59    My personal review of EKG: Pending , but she is currently in A. fib on telemetry monitor heart rate in the mid 80s.   Assessment & Plan:    Active Problems:   HLD (hyperlipidemia)   Essential hypertension   Atrial fibrillation ([facility])   Chronic anticoagulation/Eliquis for Afib   GERD without esophagitis   Chronic back pain greater than 3 months duration   Atrial fibrillation with RVR ([facility])   A. fib with RVR -Patient presents with heart rate in the 140s, she is on Cardizem drip upon presentation, currently blood pressure is soft with systolic  in the 16X, heart rate has improved in the 80s as well, so Cardizem drip has been stopped, will continue home dose Toprol-XL 75 mg daily, I will give 1 dose of IV digoxin, will keep on as needed metoprolol 2.5 mg IV pushes as needed hoping to avoid resumption of Cardizem drip which will lead to soft blood pressure. -We will check TSH. -We will repeat 2D echo. -Continue with Eliquis for anticoagulation.  Syncope -This was on Friday, most likely happened due to A. fib with RVR, I will go ahead and repeat her 2D echo given known mild to moderate aortic valve regurg -We will monitor her on telemetry  Back pain -Patient main presentation to ED due to back pain, she is with known chronic back pain L1/L2 compression fraction post kyphoplasty, on physical exam i she is tender and sacral iliac  area, there is no acute finding on imaging. - will cosnult PT and OT  Interstitial lung disease -She is followed by pulmonary as an outpatient  Rheumatoid arthritis -She is been followed with rheumatology  Hypertension -Blood pressure has dropped after Cardizem drip, but overall has been elevated on presentation, continue with home medication Toprol-XL.  History of CVA -On aspirin, Eliquis and statin.  Aortic regurg -Mild to moderate in severity and echogram in September 2020, will repeat 2D echo.   DVT Prophylaxis on Eliquis  AM Labs Ordered, also please review Full Orders  Family Communication: Admission, patients condition and plan of care including tests being ordered have been discussed with the patient and son at bedside who indicate understanding and agree with the plan and Code Status.  Code Status Full  Likely DC to  Home  Condition GUARDED    Consults called: none  Admission status:  Observation  Time spent in minutes : 60 minutes   Phillips Climes M.D on 09/28/2019 at 7:00 PM   [facility] - Office  [phone]

## 2019-09-28 NOTE — ED Provider Notes (Signed)
[facility] Provider Note   CSN: [id number] Arrival date & time: 09/28/19  1045     History Chief Complaint  Patient presents with  . Loss of Consciousness    [name] is a 86 y.o. female.  HPI   This patient is a very pleasant 86 year old female with a history of atrial fibrillation, a history of colon cancer and some chronic back pain, she also has a history of rheumatoid arthritis.  She does take Eliquis.  She reports that she is here today because of some worsening back pain and some lightheadedness.  She had had a fall on Friday when she stood up to walk out of the room, felt lightheaded and collapsed.  She had syncopized and when she came around she called for help.  A family member came over and helped her get onto the bed but she does remember being initially nauseated.  She is still a bit nauseated but has no headache.  She does not recall hitting her head, she does not have any chest pain or shortness of breath.  She does have some ongoing back pain.  There is no numbness or weakness going down the legs, no urinary symptoms, no fevers or chills.  She has been good about taking her medications.  She did take hydrocodone prior to coming to the hospital today.  She is unaware of her heart rate is going fast but states that she can usually feel it, of note her heart rate was approximately 140 bpm and atrial flutter with variable block on arrival.  Past Medical History:  Diagnosis Date  . A-fib ([place])   . Atrial fibrillation ([place])   . Cervicalgia   . Colon cancer ([place])   . Degeneration of cervical intervertebral disc   . Hypertension   . Pinched nerve in shoulder   . Rheumatoid arthritis(714.0)   . RUQ pain   . Vulvar dystrophy   . Yeast infection     Patient Active Problem List   Diagnosis Date Noted  . ILD (interstitial lung disease) ([place]) 08/31/2019  . Shortness of breath 08/31/2019  . Vitamin D deficiency 09/18/2018  . Chronic back pain  greater than 3 months duration 09/06/2018  . GERD without esophagitis 09/04/2018  . Chronic vertigo 09/04/2018  . Hypomagnesemia 09/04/2018  . Rheumatoid arthritis ([place])   . TIA (transient ischemic attack) 08/25/2018  . CVA (cerebral vascular accident) Acute subcentimeter infarction in the right [place] 08/25/2018  . Chronic anticoagulation/Eliquis for Afib 08/25/2018  . Acute CVA (cerebrovascular accident) ([place]) 08/25/2018  . CAP (community acquired pneumonia) 02/24/2017  . Acute respiratory failure with hypoxia ([place]) 02/23/2017  . Bronchitis 02/22/2017  . Hypoxia 02/22/2017  . Generalized weakness 02/22/2017  . RAD (reactive airway disease) with wheezing, mild intermittent, with acute exacerbation 02/22/2017  . Chronic pain 02/22/2017  . Vulvar dystrophy 05/23/2014  . Atrial fibrillation ([place]) 08/10/2013  . Essential hypertension 07/04/2013  . Tachycardia 01/31/2013  . Dystrophy, vulva 05/04/2012  . Abdominal pain, epigastric 03/28/2012  . Dyspepsia 04/14/2011  . History of colon cancer 03/17/2011  . Diarrhea 03/17/2011  . HLD (hyperlipidemia) 11/30/2006  . BRONCHIECTASIS 11/30/2006  . RHEUMATOID ARTHRITIS 11/30/2006    Past Surgical History:  Procedure Laterality Date  . ABDOMINAL HYSTERECTOMY  1960'S  . APPENDECTOMY  with hysterectomy  . BACK SURGERY  12/08/10   plate/screws C3 C4  . CHOLECYSTECTOMY  2002  . COLONOSCOPY  04/02/2009  . COLONOSCOPY  06/04/05  . COLONOSCOPY N/A 07/26/2015  Procedure: COLONOSCOPY;  Surgeon: Rogene Houston, MD;  Location: [facility];  Service: Endoscopy;  Laterality: N/A;  1240  . COLONOSCOPY WITH ESOPHAGOGASTRODUODENOSCOPY (EGD) N/A 04/08/2012   Procedure: COLONOSCOPY WITH ESOPHAGOGASTRODUODENOSCOPY (EGD);  Surgeon: Rogene Houston, MD;  Location: [facility];  Service: Endoscopy;  Laterality: N/A;  130-moved to 245 Ann notified pt  . ESOPHAGOGASTRODUODENOSCOPY N/A 07/26/2015   Procedure: ESOPHAGOGASTRODUODENOSCOPY (EGD);   Surgeon: Rogene Houston, MD;  Location: [facility];  Service: Endoscopy;  Laterality: N/A;  . HEMICOLECTOMY  10/05     OB History    Gravida  4   Para  4   Term      Preterm      AB      Living  4     SAB      TAB      Ectopic      Multiple      Live Births  4           Family History  Problem Relation Age of Onset  . Rheum arthritis Mother   . Parkinsonism Father   . Hiatal hernia Sister   . Healthy Brother   . Healthy Daughter   . Healthy Son   . Healthy Son     Social History   Tobacco Use  . Smoking status: Never Smoker  . Smokeless tobacco: Never Used  Vaping Use  . Vaping Use: Never used  Substance Use Topics  . Alcohol use: No  . Drug use: No    Home Medications Prior to Admission medications   Medication Sig Start Date End Date Taking? Authorizing Provider  acetaminophen (TYLENOL) 325 MG tablet Take 2 tablets (650 mg total) by mouth every 6 (six) hours as needed for mild pain or headache (or Fever >/= 101). 08/31/18   Barton Dubois, MD  apixaban (ELIQUIS) 2.5 MG TABS tablet Take 1 tablet (2.5 mg total) by mouth 2 (two) times daily. 09/08/18   Gerlene Fee, NP  aspirin 81 MG chewable tablet Chew 1 tablet (81 mg total) by mouth daily. 09/01/18   Barton Dubois, MD  cholecalciferol (VITAMIN D3) 25 MCG (1000 UT) tablet Take 2,000 Units by mouth 2 (two) times daily.     [provider]  HYDROcodone-acetaminophen (NORCO) 7.5-325 MG tablet Take 1 tablet by mouth 3 (three) times daily as needed for moderate pain. 09/08/18   Gerlene Fee, NP  LASTACAFT 0.25 % SOLN Apply 1 drop to eye 2 (two) times daily. 01/17/19   [provider]  leflunomide (ARAVA) 20 MG tablet Take 1 tablet (20 mg total) by mouth daily. 09/08/18   Gerlene Fee, NP  meclizine (ANTIVERT) 12.5 MG tablet Take 1 tablet (12.5 mg total) by mouth 2 (two) times daily as needed for dizziness. 09/08/18   Gerlene Fee, NP  metoprolol succinate (TOPROL-XL) 25 MG  24 hr tablet Take 3 tablets (75 mg total) by mouth daily. 04/19/19   Herminio Commons, MD  omeprazole (PRILOSEC) 20 MG capsule TAKE 1 CAPSULE BY MOUTH TWICE DAILY 07/25/19   Laurine Blazer A, PA-C  rosuvastatin (CRESTOR) 20 MG tablet Take 1 tablet (20 mg total) by mouth daily at 6 PM. 09/08/18   Nyoka Cowden, Phylis Bougie, NP    Allergies    Iodinated diagnostic agents, Nerve block tray, Nitrofuran derivatives, Triamcinolone acetonide, and Vibra-tab [doxycycline]  Review of Systems   Review of Systems  All other systems reviewed and are negative.   Physical  Exam Updated Vital Signs BP (!) 87/59 (BP Location: Left Arm)   Pulse 82   Temp 98.1 F (36.7 C) (Oral)   Resp 20   Ht 1.575 m (5\' 2" )   Wt 54.4 kg   SpO2 92%   BMI 21.95 kg/m   Physical Exam Vitals and nursing note reviewed.  Constitutional:      General: She is not in acute distress.    Appearance: She is well-developed.  HENT:     Head: Normocephalic and atraumatic.     Mouth/Throat:     Pharynx: No oropharyngeal exudate.  Eyes:     General: No scleral icterus.       Right eye: No discharge.        Left eye: No discharge.     Conjunctiva/sclera: Conjunctivae normal.     Pupils: Pupils are equal, round, and reactive to light.  Neck:     Thyroid: No thyromegaly.     Vascular: No JVD.  Cardiovascular:     Rate and Rhythm: Tachycardia present. Rhythm irregular.     Heart sounds: Normal heart sounds. No murmur heard.  No friction rub. No gallop.   Pulmonary:     Effort: Pulmonary effort is normal. No respiratory distress.     Breath sounds: Normal breath sounds. No wheezing or rales.  Abdominal:     General: Bowel sounds are normal. There is no distension.     Palpations: Abdomen is soft. There is no mass.     Tenderness: There is no abdominal tenderness.  Musculoskeletal:        General: No tenderness. Normal range of motion.     Cervical back: Normal range of motion and neck supple.     Comments: Tender to  palpation over the sacral and coccygeal area but not over the lumbar spine  Lymphadenopathy:     Cervical: No cervical adenopathy.  Skin:    General: Skin is warm and dry.     Findings: No erythema or rash.  Neurological:     Mental Status: She is alert.     Coordination: Coordination normal.  Psychiatric:        Behavior: Behavior normal.     ED Results / Procedures / Treatments   Labs (all labs ordered are listed, but only abnormal results are displayed) Labs Reviewed  BASIC METABOLIC PANEL - Abnormal; Notable for the following components:      Result Value   Calcium 8.4 (*)    All other components within normal limits  CBC - Abnormal; Notable for the following components:   Platelets 138 (*)    All other components within normal limits  URINALYSIS, ROUTINE W REFLEX MICROSCOPIC - Abnormal; Notable for the following components:   Leukocytes,Ua TRACE (*)    All other components within normal limits  URINE CULTURE  SARS CORONAVIRUS 2 BY RT PCR (HOSPITAL ORDER, [facility])    EKG EKG Interpretation  Date/Time:  Thursday September 28 2019 11:20:07 EDT Ventricular Rate:  113 PR Interval:    QRS Duration: 64 QT Interval:  310 QTC Calculation: 425 R Axis:   -16 Text Interpretation: Atrial flutter with variable A-V block ST & T wave abnormality, consider lateral ischemia Abnormal ECG Artifact Since last tracing rate faster now irregular rhythym, undetermined Otherwise no significant change Confirmed by Daleen Bo [phone]) on 09/28/2019 1:59:49 PM   Radiology DG Lumbar Spine Complete  Result Date: 09/28/2019 CLINICAL DATA:  Status post fall presenting with lower  back pain and right leg pain. EXAM: LUMBAR SPINE - COMPLETE 4+ VIEW; SACRUM AND COCCYX - 2+ VIEW COMPARISON:  X-ray lumbar spine 04/20/2017. x-ray fluoro guided kyphoplasty 04/28/2017 report WITHOUT images FINDINGS: Five non-rib-bearing lumbar vertebral bodies. Partial visualization of the  sacrum due to overlying bowel. Slight dextrocurvature of the thoracolumbar spine is again noted. Alignment is otherwise normal. Worsened anterior L1 vertebral body compression fracture demonstrating at least 60% (from 20%) height loss with interval kyphoplasty. Stable mild compression fracture of the L2 vertebral body with kyphoplasty at this level again noted. There is no evidence of acute displaced sacral spine fracture with the visualized sacral arcuate lines appearing intact. Multilevel mild intervertebral disc space narrowing. Multilevel degenerative changes of the spine with osteophyte formation and moderate severe facet arthropathy. Surgical clips and bowel anastomotic sutures overlie the pelvis. Severe atherosclerotic plaque of the aorta. IMPRESSION: 1. Age-indeterminate worsening of an anterior L1 vertebral body compression fracture now demonstrating at least 60% height loss anteriorly with interval kyphoplasty at this level. (Unclear if the worsened compression fracture happened prior or following the new kyphoplasty at this level). 2. Stable mild compression fracture of L2 vertebral body status post kyphoplasty. 3. No acute displaced fracture of the sacral spine. 4. No traumatic listhesis of the lumbosacral spine. Electronically Signed   By: Iven Finn M.D.   On: 09/28/2019 17:43   DG Sacrum/Coccyx  Result Date: 09/28/2019 CLINICAL DATA:  Status post fall presenting with lower back pain and right leg pain. EXAM: LUMBAR SPINE - COMPLETE 4+ VIEW; SACRUM AND COCCYX - 2+ VIEW COMPARISON:  X-ray lumbar spine 04/20/2017. x-ray fluoro guided kyphoplasty 04/28/2017 report WITHOUT images FINDINGS: Five non-rib-bearing lumbar vertebral bodies. Partial visualization of the sacrum due to overlying bowel. Slight dextrocurvature of the thoracolumbar spine is again noted. Alignment is otherwise normal. Worsened anterior L1 vertebral body compression fracture demonstrating at least 60% (from 20%) height loss  with interval kyphoplasty. Stable mild compression fracture of the L2 vertebral body with kyphoplasty at this level again noted. There is no evidence of acute displaced sacral spine fracture with the visualized sacral arcuate lines appearing intact. Multilevel mild intervertebral disc space narrowing. Multilevel degenerative changes of the spine with osteophyte formation and moderate severe facet arthropathy. Surgical clips and bowel anastomotic sutures overlie the pelvis. Severe atherosclerotic plaque of the aorta. IMPRESSION: 1. Age-indeterminate worsening of an anterior L1 vertebral body compression fracture now demonstrating at least 60% height loss anteriorly with interval kyphoplasty at this level. (Unclear if the worsened compression fracture happened prior or following the new kyphoplasty at this level). 2. Stable mild compression fracture of L2 vertebral body status post kyphoplasty. 3. No acute displaced fracture of the sacral spine. 4. No traumatic listhesis of the lumbosacral spine. Electronically Signed   By: Iven Finn M.D.   On: 09/28/2019 17:43   CT Head Wo Contrast  Result Date: 09/28/2019 CLINICAL DATA:  Syncope, simple, normal neuro exam. Additional history provided: Patient reports dizziness, syncope on Friday night. EXAM: CT HEAD WITHOUT CONTRAST TECHNIQUE: Contiguous axial images were obtained from the base of the skull through the vertex without intravenous contrast. COMPARISON:  MRI/MRA head 08/25/2018, head CT 08/25/2018. FINDINGS: Brain Stable, moderate generalized parenchymal atrophy. Known chronic lacunar infarcts within the cerebral white matter, basal ganglia, thalami and cerebellum some of which were better appreciated on the prior MRI of 08/25/2018. Stable background mild ill-defined hypoattenuation within the cerebral white matter and pons which is nonspecific, but consistent with chronic small vessel ischemic disease.  There is no acute intracranial hemorrhage. No demarcated  cortical infarct. No extra-axial fluid collection. No evidence of intracranial mass. No midline shift. Vascular: No hyperdense vessel.  Atherosclerotic calcifications. Skull: Normal. Negative for fracture or focal lesion. Sinuses/Orbits: Visualized orbits show no acute finding. Frothy secretions, mild mucosal thickening and small air-fluid level within the left maxillary sinus. Frothy secretions and moderate mucosal thickening within the right sphenoid sinus. Mild partial opacification of ethmoid air cells. No significant mastoid effusion. IMPRESSION: No evidence of acute intracranial abnormality. Known chronic lacunar infarcts within the cerebral white matter, basal ganglia, thalami and cerebellum. Stable background mild chronic small vessel ischemic disease within the cerebral white matter and pons. Stable moderate generalized parenchymal atrophy. Ethmoid, right sphenoid and left maxillary sinusitis. Electronically Signed   By: Kellie Simmering DO   On: 09/28/2019 16:52   DG Chest Port 1 View  Result Date: 09/28/2019 CLINICAL DATA:  Atrial arrhythmia, syncopal episode Friday night EXAM: PORTABLE CHEST 1 VIEW COMPARISON:  Radiograph 08/31/2019, CT 05/04/2019 FINDINGS: Chronically coarsened interstitial and bronchitic features are similar to comparison radiography and suggested to be on the spectrum of UIP on prior high-resolution CT examination. No consolidation, features of edema, pneumothorax, or effusion. The aorta is calcified. The remaining cardiomediastinal contours are unremarkable. No acute osseous or soft tissue abnormality. Degenerative changes are present in the imaged spine and shoulders. Telemetry leads overlie the chest. IMPRESSION: No acute cardiopulmonary abnormality. Chronically coarsened interstitial and bronchitic features are similar to comparison radiography and previously suggested to be on the spectrum of UIP on prior HRCT. Aortic Atherosclerosis (ICD10-I70.0). Electronically Signed   By:  Lovena Le M.D.   On: 09/28/2019 16:59    Procedures .Critical Care Performed by: Noemi Chapel, MD Authorized by: Noemi Chapel, MD   Critical care provider statement:    Critical care time (minutes):  35   Critical care time was exclusive of:  Separately billable procedures and treating other patients and teaching time   Critical care was necessary to treat or prevent imminent or life-threatening deterioration of the following conditions:  Cardiac failure   Critical care was time spent personally by me on the following activities:  Blood draw for specimens, development of treatment plan with patient or surrogate, discussions with consultants, evaluation of patient's response to treatment, examination of patient, obtaining history from patient or surrogate, ordering and performing treatments and interventions, ordering and review of laboratory studies, ordering and review of radiographic studies, pulse oximetry, re-evaluation of patient's condition and review of old charts   (including critical care time)  Medications Ordered in ED Medications  diltiazem (CARDIZEM) 125 mg in dextrose 5% 125 mL (1 mg/mL) infusion (5 mg/hr Intravenous New Bag/Given 09/28/19 1750)  metoprolol tartrate (LOPRESSOR) injection 5 mg (5 mg Intravenous Given 09/28/19 1626)  HYDROcodone-acetaminophen (NORCO/VICODIN) 5-325 MG per tablet 2 tablet (2 tablets Oral Given 09/28/19 1626)    ED Course  I have reviewed the triage vital signs and the nursing notes.  Pertinent labs & imaging results that were available during my care of the patient were reviewed by me and considered in my medical decision making (see chart for details).    MDM Rules/Calculators/A&P                          Clinically this patient is in atrial flutter with rapid ventricular rate.  This may have been the nidus for the fall though she states she was sitting for an extended period  of time prior to standing.  She is not hypotensive or febrile,  she will need rate control, imaging of her lower back and her head, she is already anticoagulated and taking her Eliquis appropriately.  Based on prior EKG the patient is usually in normal rhythm, February 2020 when she did not have an arrhythmia.  It is unclear how long she has been in atrial flutter today.  Thankfully the labs look reassuring, she is not anemic nor is her metabolic panel unusual at all.  There is a slight thrombocytopenia but of no consequence at this time  Care was discussed with hospitalist to admit the patient to the hospital for ongoing atrial fibrillation with a rapid ventricular rate.  The heart rate is currently in the 130-1 140s but improving on a Cardizem drip.  Labs and x-rays reviewed, prior L1 compression fracture status post kyphoplasty  Final Clinical Impression(s) / ED Diagnoses Final diagnoses:  Atrial fibrillation with rapid ventricular response ([place])      Noemi Chapel, MD 09/28/19 [phone]

## 2019-09-28 NOTE — ED Triage Notes (Signed)
Pt passed out Friday night, when she came to she was in the floor.  Pt was nauseated and having dizzy spells off and on since. Pt c/o lower back pain as well, right leg pain too.

## 2019-09-28 NOTE — ED Notes (Signed)
RN notified of pts BP.  

## 2019-09-29 ENCOUNTER — Inpatient Hospital Stay (HOSPITAL_COMMUNITY): Payer: Medicare Other | Admitting: Anesthesiology

## 2019-09-29 ENCOUNTER — Encounter (HOSPITAL_COMMUNITY): Payer: Self-pay | Admitting: Internal Medicine

## 2019-09-29 ENCOUNTER — Observation Stay (HOSPITAL_COMMUNITY): Payer: Medicare Other

## 2019-09-29 ENCOUNTER — Other Ambulatory Visit: Payer: Self-pay

## 2019-09-29 ENCOUNTER — Encounter (HOSPITAL_COMMUNITY)
Admission: EM | Disposition: A | Discharge status: Home / Self Care | Payer: Self-pay | Source: Home / Self Care | Attending: Internal Medicine

## 2019-09-29 DIAGNOSIS — M549 Dorsalgia, unspecified: Secondary | ICD-10-CM | POA: Diagnosis not present

## 2019-09-29 DIAGNOSIS — Z8673 Personal history of transient ischemic attack (TIA), and cerebral infarction without residual deficits: Secondary | ICD-10-CM | POA: Diagnosis not present

## 2019-09-29 DIAGNOSIS — Z7901 Long term (current) use of anticoagulants: Secondary | ICD-10-CM

## 2019-09-29 DIAGNOSIS — I700 Atherosclerosis of aorta: Secondary | ICD-10-CM | POA: Diagnosis present

## 2019-09-29 DIAGNOSIS — M069 Rheumatoid arthritis, unspecified: Secondary | ICD-10-CM | POA: Diagnosis present

## 2019-09-29 DIAGNOSIS — Z8261 Family history of arthritis: Secondary | ICD-10-CM | POA: Diagnosis not present

## 2019-09-29 DIAGNOSIS — E782 Mixed hyperlipidemia: Secondary | ICD-10-CM

## 2019-09-29 DIAGNOSIS — I350 Nonrheumatic aortic (valve) stenosis: Secondary | ICD-10-CM

## 2019-09-29 DIAGNOSIS — I4892 Unspecified atrial flutter: Secondary | ICD-10-CM | POA: Diagnosis present

## 2019-09-29 DIAGNOSIS — Z888 Allergy status to other drugs, medicaments and biological substances status: Secondary | ICD-10-CM | POA: Diagnosis not present

## 2019-09-29 DIAGNOSIS — K219 Gastro-esophageal reflux disease without esophagitis: Secondary | ICD-10-CM | POA: Diagnosis present

## 2019-09-29 DIAGNOSIS — I482 Chronic atrial fibrillation, unspecified: Secondary | ICD-10-CM | POA: Diagnosis present

## 2019-09-29 DIAGNOSIS — M5030 Other cervical disc degeneration, unspecified cervical region: Secondary | ICD-10-CM | POA: Diagnosis present

## 2019-09-29 DIAGNOSIS — R55 Syncope and collapse: Secondary | ICD-10-CM | POA: Diagnosis not present

## 2019-09-29 DIAGNOSIS — Z7982 Long term (current) use of aspirin: Secondary | ICD-10-CM | POA: Diagnosis not present

## 2019-09-29 DIAGNOSIS — I480 Paroxysmal atrial fibrillation: Secondary | ICD-10-CM | POA: Diagnosis present

## 2019-09-29 DIAGNOSIS — Z20822 Contact with and (suspected) exposure to covid-19: Secondary | ICD-10-CM | POA: Diagnosis present

## 2019-09-29 DIAGNOSIS — E785 Hyperlipidemia, unspecified: Secondary | ICD-10-CM | POA: Diagnosis present

## 2019-09-29 DIAGNOSIS — Z91041 Radiographic dye allergy status: Secondary | ICD-10-CM | POA: Diagnosis not present

## 2019-09-29 DIAGNOSIS — I10 Essential (primary) hypertension: Secondary | ICD-10-CM | POA: Diagnosis present

## 2019-09-29 DIAGNOSIS — I4891 Unspecified atrial fibrillation: Secondary | ICD-10-CM | POA: Diagnosis present

## 2019-09-29 DIAGNOSIS — Z9049 Acquired absence of other specified parts of digestive tract: Secondary | ICD-10-CM | POA: Diagnosis not present

## 2019-09-29 DIAGNOSIS — J849 Interstitial pulmonary disease, unspecified: Secondary | ICD-10-CM | POA: Diagnosis present

## 2019-09-29 DIAGNOSIS — G8929 Other chronic pain: Secondary | ICD-10-CM | POA: Diagnosis present

## 2019-09-29 DIAGNOSIS — I351 Nonrheumatic aortic (valve) insufficiency: Secondary | ICD-10-CM | POA: Diagnosis present

## 2019-09-29 DIAGNOSIS — Z85038 Personal history of other malignant neoplasm of large intestine: Secondary | ICD-10-CM | POA: Diagnosis not present

## 2019-09-29 DIAGNOSIS — Z23 Encounter for immunization: Secondary | ICD-10-CM | POA: Diagnosis present

## 2019-09-29 HISTORY — PX: CARDIOVERSION: SHX1299

## 2019-09-29 LAB — CBC
HCT: 38.5 % (ref 36.0–46.0)
Hemoglobin: 12.4 g/dL (ref 12.0–15.0)
MCH: 30.4 pg (ref 26.0–34.0)
MCHC: 32.2 g/dL (ref 30.0–36.0)
MCV: 94.4 fL (ref 80.0–100.0)
Platelets: 132 10*3/uL — ABNORMAL LOW (ref 150–400)
RBC: 4.08 MIL/uL (ref 3.87–5.11)
RDW: 14.7 % (ref 11.5–15.5)
WBC: 3.9 10*3/uL — ABNORMAL LOW (ref 4.0–10.5)
nRBC: 0 % (ref 0.0–0.2)

## 2019-09-29 LAB — BASIC METABOLIC PANEL
Anion gap: 7 (ref 5–15)
BUN: 15 mg/dL (ref 8–23)
CO2: 27 mmol/L (ref 22–32)
Calcium: 8 mg/dL — ABNORMAL LOW (ref 8.9–10.3)
Chloride: 105 mmol/L (ref 98–111)
Creatinine, Ser: 0.63 mg/dL (ref 0.44–1.00)
GFR calc Af Amer: >60 mL/min (ref >60)
GFR calc non Af Amer: >60 mL/min (ref >60)
Glucose, Bld: 100 mg/dL — ABNORMAL HIGH (ref 70–99)
Potassium: 3.7 mmol/L (ref 3.5–5.1)
Sodium: 139 mmol/L (ref 135–145)

## 2019-09-29 LAB — ECHOCARDIOGRAM COMPLETE
AR max vel: 2.37 cm2
AV Area VTI: 2.27 cm2
AV Area mean vel: 2.53 cm2
AV Mean grad: 3.3 mmHg
AV Peak grad: 6.6 mmHg
Ao pk vel: 1.28 m/s
Area-P 1/2: 5.84 cm2
Height: 62 in
S' Lateral: 2.08 cm
Weight: 1920 oz

## 2019-09-29 LAB — URINE CULTURE: Culture: <10000 — AB

## 2019-09-29 SURGERY — CARDIOVERSION
Anesthesia: General

## 2019-09-29 MED ORDER — PANTOPRAZOLE SODIUM 40 MG PO TBEC
40.0000 mg | DELAYED_RELEASE_TABLET | Freq: Two times a day (BID) | ORAL | Status: DC
  Administered 2019-09-29 – 2019-09-30 (×2): 40 mg via ORAL
  Filled 2019-09-29 (×2): qty 1

## 2019-09-29 MED ORDER — PROPOFOL 10 MG/ML IV BOLUS
INTRAVENOUS | Status: AC
  Filled 2019-09-29: qty 20

## 2019-09-29 MED ORDER — ALUM & MAG HYDROXIDE-SIMETH 200-200-20 MG/5ML PO SUSP
15.0000 mL | Freq: Once | ORAL | Status: AC
  Administered 2019-09-29: 15 mL via ORAL
  Filled 2019-09-29: qty 30

## 2019-09-29 MED ORDER — METOPROLOL TARTRATE 5 MG/5ML IV SOLN: 5.0000 mg | Freq: Once | INTRAVENOUS | Status: DC

## 2019-09-29 MED ORDER — HYDROCODONE-ACETAMINOPHEN 7.5-325 MG PO TABS
1.0000 | ORAL_TABLET | Freq: Once | ORAL | Status: AC
  Administered 2019-09-29: 1 via ORAL
  Filled 2019-09-29: qty 1

## 2019-09-29 MED ORDER — INFLUENZA VAC A&B SA ADJ QUAD 0.5 ML IM PRSY
0.5000 mL | PREFILLED_SYRINGE | INTRAMUSCULAR | Status: AC
  Administered 2019-09-30: 0.5 mL via INTRAMUSCULAR
  Filled 2019-09-29 (×2): qty 0.5

## 2019-09-29 MED ORDER — LACTATED RINGERS IV SOLN: Freq: Once | INTRAVENOUS | Status: AC

## 2019-09-29 MED ORDER — PHENYLEPHRINE 40 MCG/ML (10ML) SYRINGE FOR IV PUSH (FOR BLOOD PRESSURE SUPPORT)
PREFILLED_SYRINGE | INTRAVENOUS | Status: DC | PRN
  Administered 2019-09-29: 120 ug via INTRAVENOUS
  Administered 2019-09-29: 80 ug via INTRAVENOUS
  Administered 2019-09-29: 120 ug via INTRAVENOUS
  Administered 2019-09-29: 200 ug via INTRAVENOUS

## 2019-09-29 MED ORDER — LIDOCAINE 2% (20 MG/ML) 5 ML SYRINGE
INTRAMUSCULAR | Status: DC | PRN
  Administered 2019-09-29: 40 mg via INTRAVENOUS

## 2019-09-29 MED ORDER — LACTATED RINGERS IV SOLN: INTRAVENOUS | Status: DC | PRN

## 2019-09-29 MED ORDER — PROPOFOL 10 MG/ML IV BOLUS
INTRAVENOUS | Status: DC | PRN
  Administered 2019-09-29: 50 mg via INTRAVENOUS

## 2019-09-29 MED ORDER — LIDOCAINE 2% (20 MG/ML) 5 ML SYRINGE
INTRAMUSCULAR | Status: AC
  Filled 2019-09-29: qty 5

## 2019-09-29 MED ORDER — ALUM & MAG HYDROXIDE-SIMETH 200-200-20 MG/5ML PO SUSP
15.0000 mL | Freq: Four times a day (QID) | ORAL | Status: DC | PRN
  Administered 2019-09-29: 15 mL via ORAL
  Filled 2019-09-29: qty 30

## 2019-09-29 NOTE — Transfer of Care (Signed)
Immediate Anesthesia Transfer of Care Note  Patient: [name]  Procedure(s) Performed: CARDIOVERSION (N/A )  Patient Location: PACU  Anesthesia Type:General  Level of Consciousness: awake, alert , oriented and sedated  Airway & Oxygen Therapy: Patient Spontanous Breathing and Patient connected to nasal cannula oxygen  Post-op Assessment: Report given to RN and Post -op Vital signs reviewed and stable  Post vital signs: Reviewed and stable  Last Vitals:  Vitals Value Taken Time  BP 102/61 09/29/19 1720  Temp 36.4 C 09/29/19 1712  Pulse 83 09/29/19 1721  Resp 28 09/29/19 1721  SpO2 96 % 09/29/19 1721  Vitals shown include unvalidated device data.  Last Pain:  Vitals:   09/29/19 1712  TempSrc:   PainSc: 0-No pain         Complications: No complications documented.

## 2019-09-29 NOTE — ED Notes (Signed)
Dr Branch at bedside. ?

## 2019-09-29 NOTE — OR Nursing (Addendum)
Spoke with Shirlean Mylar in ER and relayed information that patient ate breakfast  Between 1015 and 1030 this morning. Per Dr. Wyatt Haste we need to wait at least 6 hours before procedure. Dr. Harl Bowie notified. Patient ate muffin small amount of eggs and drank apple Juice.

## 2019-09-29 NOTE — H&P (Signed)
Procedure H&P  Please see references consult note below for full history. Patient presents for elective electrical cardioversion for afib with RVR, has been on eliquis at home without any missed does within the last 3 weeks. Plan for electrical cardioversion with the assistance of anesthesiology   Carlyle Dolly MD    Cardiology Consultation:   Patient ID: [name] MRN: [id number]; DOB: [birth year]  Admit date: 09/28/2019 Date of Consult: 09/29/2019  Primary Care Provider: [name], MD [facility] Cardiologist: Kate Sable, MD (Inactive)  [facility] Electrophysiologist:  None    Patient Profile:   [name] is a 86 y.o. female with a hx of PAF, rheumatoid arthritis, interstitial lung disease who is being seen today for the evaluation of afib with RVR at the request of Dr Dyann Kief.  History of Present Illness:   [name] 86 yo female history of PAF, rheumoatid, prior CVA on eliquis and ASA for this reason per notes, HTN, aortic regurgitation, interstitial lung disease, admitted with weakness and syncope. In ER found to be in afib with RVR.    K 36 Cr 0.75 BUN 15 WBC 4.6 Hgb 13 Plt 138 TSH 1.075  COVID neg  EKG afib with RVR CT head no acute process CXR no acute process       Past Medical History:  Diagnosis Date  . A-fib ([place])   . Atrial fibrillation ([place])   . Cervicalgia   . Colon cancer ([place])   . Degeneration of cervical intervertebral disc   . Hypertension   . Pinched nerve in shoulder   . Rheumatoid arthritis(714.0)   . RUQ pain   . Vulvar dystrophy   . Yeast infection          Past Surgical History:  Procedure Laterality Date  . ABDOMINAL HYSTERECTOMY  1960'S  . APPENDECTOMY  with hysterectomy  . BACK SURGERY  12/08/10   plate/screws C3 C4  . CHOLECYSTECTOMY  2002  . COLONOSCOPY  04/02/2009  . COLONOSCOPY  06/04/05  . COLONOSCOPY N/A 07/26/2015   Procedure: COLONOSCOPY;  Surgeon:  Rogene Houston, MD;  Location: [facility];  Service: Endoscopy;  Laterality: N/A;  1240  . COLONOSCOPY WITH ESOPHAGOGASTRODUODENOSCOPY (EGD) N/A 04/08/2012   Procedure: COLONOSCOPY WITH ESOPHAGOGASTRODUODENOSCOPY (EGD);  Surgeon: Rogene Houston, MD;  Location: [facility];  Service: Endoscopy;  Laterality: N/A;  130-moved to 245 Ann notified pt  . ESOPHAGOGASTRODUODENOSCOPY N/A 07/26/2015   Procedure: ESOPHAGOGASTRODUODENOSCOPY (EGD);  Surgeon: Rogene Houston, MD;  Location: [facility];  Service: Endoscopy;  Laterality: N/A;  . HEMICOLECTOMY  10/05      Inpatient Medications: Scheduled Meds: . apixaban  2.5 mg Oral BID  . cholecalciferol  2,000 Units Oral BID  . metoprolol succinate  75 mg Oral Daily  . metoprolol tartrate  5 mg Intravenous Once  . pantoprazole  40 mg Oral Daily  . rosuvastatin  20 mg Oral q1800   Continuous Infusions: PRN Meds: acetaminophen, ondansetron (ZOFRAN) IV  Allergies:         Allergies  Allergen Reactions  . Iodinated Diagnostic Agents Anaphylaxis    08-12-10 Pt had severe reaction with itching/hives/loss of consciousness within 5 minutes of CEPI today.  She received both Kenalog and Iondinated Contrast, so not certain which agent caused reaction.  If patient returns for another CEPI, Dr. Jobe Igo considers using Decadron and a 13-hour prep.  jkl  . Nerve Block Tray Anaphylaxis    Patient states that she was sent to have  Epidural for her shoulder. After rec'ving she remembers saying that she was itching all over  And that's it. She was transported to hospital  By EMS. Stayed overnight.  [place] Spikes Derivatives Hives and Nausea And Vomiting  . Triamcinolone Acetonide Anaphylaxis    08-12-10  Pt had severe reaction with itching/hives/loss of consciousness within five minutes of CEPI today.  She received both Kenalog and Iodinated Contrast, so not certain which agent caused reaction.  If pt returns for another cervical epidural steroid  injection, Dr. Jobe Igo suggests using Decadron and a 13-hr prep.  JKL PATIENT USES PROCTOCREAM 2.5% WITHOUT DIFFICULTY FOR VULVAR DYSTROPHY Patient says she can take prednisone without any problem  . Vibra-Tab [Doxycycline] Nausea And Vomiting    Social History:   Social History        Socioeconomic History  . Marital status: Married    Spouse name: Not on file  . Number of children: Not on file  . Years of education: Not on file  . Highest education level: Not on file  Occupational History  . Not on file  Tobacco Use  . Smoking status: Never Smoker  . Smokeless tobacco: Never Used  Vaping Use  . Vaping Use: Never used  Substance and Sexual Activity  . Alcohol use: No  . Drug use: No  . Sexual activity: Never  Other Topics Concern  . Not on file  Social History Narrative   Nonsmoker.  She lives alone.  Her son calls her every day to check on her.  Has a daughter involved in care as well.     Social Determinants of Health      Financial Resource Strain:   . Difficulty of Paying Living Expenses: Not on file  Food Insecurity:   . Worried About Charity fundraiser in the Last Year: Not on file  . Ran Out of Food in the Last Year: Not on file  Transportation Needs:   . Lack of Transportation (Medical): Not on file  . Lack of Transportation (Non-Medical): Not on file  Physical Activity:   . Days of Exercise per Week: Not on file  . Minutes of Exercise per Session: Not on file  Stress:   . Feeling of Stress : Not on file  Social Connections:   . Frequency of Communication with Friends and Family: Not on file  . Frequency of Social Gatherings with Friends and Family: Not on file  . Attends Religious Services: Not on file  . Active Member of Clubs or Organizations: Not on file  . Attends Archivist Meetings: Not on file  . Marital Status: Not on file  Intimate Partner Violence:   . Fear of Current or Ex-Partner: Not on file  . Emotionally Abused: Not  on file  . Physically Abused: Not on file  . Sexually Abused: Not on file    Family History:         Family History  Problem Relation Age of Onset  . Rheum arthritis Mother   . Parkinsonism Father   . Hiatal hernia Sister   . Healthy Brother   . Healthy Daughter   . Healthy Son   . Healthy Son      ROS:  Please see the history of present illness.   All other ROS reviewed and negative.     Physical Exam/Data:         Vitals:   09/29/19 0800 09/29/19 0930 09/29/19 0945 09/29/19 1000  BP: 133/63  Pulse: 98 (!) 113 (!) 105 (!) 112  Resp: (!) 25 (!) 29 (!) 23 (!) 27  Temp:      TempSrc:      SpO2: 95% 93% 94% 93%  Weight:      Height:       No intake or output data in the 24 hours ending 09/29/19 1017 Last 3 Weights 09/28/2019 08/31/2019 03/08/2019  Weight (lbs) 120 lb 123 lb 3.2 oz 124 lb  Weight (kg) 54.432 kg 55.883 kg 56.246 kg     Body mass index is 21.95 kg/m.  General:  Well nourished, well developed, in no acute distress HEENT: normal Lymph: no adenopathy Neck: no JVD Endocrine:  No thryomegaly Vascular: No carotid bruits; FA pulses 2+ bilaterally without bruits  Cardiac:  Irreg, tachy Lungs:  clear to auscultation bilaterally, no wheezing, rhonchi or rales  Abd: soft, nontender, no hepatomegaly  Ext: no edema Musculoskeletal:  No deformities, BUE and BLE strength normal and equal Skin: warm and dry  Neuro:  CNs 2-12 intact, no focal abnormalities noted Psych:  Normal affect     Laboratory Data:  High Sensitivity Troponin:   Last Labs   No results for input(s): TROPONINIHS in the last 720 hours.     Chemistry Last Labs       Recent Labs  Lab 09/28/19 1242 09/29/19 0252  NA 141 139  K 3.6 3.7  CL 101 105  CO2 26 27  GLUCOSE 90 100*  BUN 15 15  CREATININE 0.75 0.63  CALCIUM 8.4* 8.0*  GFRNONAA >60 >60  GFRAA >60 >60  ANIONGAP 14 7      Last Labs   No results for input(s): PROT, ALBUMIN, AST,  ALT, ALKPHOS, BILITOT in the last 168 hours.   Hematology Last Labs       Recent Labs  Lab 09/28/19 1242 09/29/19 0252  WBC 4.6 3.9*  RBC 4.33 4.08  HGB 13.0 12.4  HCT 42.0 38.5  MCV 97.0 94.4  MCH 30.0 30.4  MCHC 31.0 32.2  RDW 14.7 14.7  PLT 138* 132*     BNP Last Labs   No results for input(s): BNP, PROBNP in the last 168 hours.    DDimer  Last Labs   No results for input(s): DDIMER in the last 168 hours.     Radiology/Studies:  DG Lumbar Spine Complete  Result Date: 09/28/2019 CLINICAL DATA:  Status post fall presenting with lower back pain and right leg pain. EXAM: LUMBAR SPINE - COMPLETE 4+ VIEW; SACRUM AND COCCYX - 2+ VIEW COMPARISON:  X-ray lumbar spine 04/20/2017. x-ray fluoro guided kyphoplasty 04/28/2017 report WITHOUT images FINDINGS: Five non-rib-bearing lumbar vertebral bodies. Partial visualization of the sacrum due to overlying bowel. Slight dextrocurvature of the thoracolumbar spine is again noted. Alignment is otherwise normal. Worsened anterior L1 vertebral body compression fracture demonstrating at least 60% (from 20%) height loss with interval kyphoplasty. Stable mild compression fracture of the L2 vertebral body with kyphoplasty at this level again noted. There is no evidence of acute displaced sacral spine fracture with the visualized sacral arcuate lines appearing intact. Multilevel mild intervertebral disc space narrowing. Multilevel degenerative changes of the spine with osteophyte formation and moderate severe facet arthropathy. Surgical clips and bowel anastomotic sutures overlie the pelvis. Severe atherosclerotic plaque of the aorta. IMPRESSION: 1. Age-indeterminate worsening of an anterior L1 vertebral body compression fracture now demonstrating at least 60% height loss anteriorly with interval kyphoplasty at this level. (Unclear if the worsened compression fracture happened  prior or following the new kyphoplasty at this level). 2. Stable mild  compression fracture of L2 vertebral body status post kyphoplasty. 3. No acute displaced fracture of the sacral spine. 4. No traumatic listhesis of the lumbosacral spine. Electronically Signed   By: Iven Finn M.D.   On: 09/28/2019 17:43   DG Sacrum/Coccyx  Result Date: 09/28/2019 CLINICAL DATA:  Status post fall presenting with lower back pain and right leg pain. EXAM: LUMBAR SPINE - COMPLETE 4+ VIEW; SACRUM AND COCCYX - 2+ VIEW COMPARISON:  X-ray lumbar spine 04/20/2017. x-ray fluoro guided kyphoplasty 04/28/2017 report WITHOUT images FINDINGS: Five non-rib-bearing lumbar vertebral bodies. Partial visualization of the sacrum due to overlying bowel. Slight dextrocurvature of the thoracolumbar spine is again noted. Alignment is otherwise normal. Worsened anterior L1 vertebral body compression fracture demonstrating at least 60% (from 20%) height loss with interval kyphoplasty. Stable mild compression fracture of the L2 vertebral body with kyphoplasty at this level again noted. There is no evidence of acute displaced sacral spine fracture with the visualized sacral arcuate lines appearing intact. Multilevel mild intervertebral disc space narrowing. Multilevel degenerative changes of the spine with osteophyte formation and moderate severe facet arthropathy. Surgical clips and bowel anastomotic sutures overlie the pelvis. Severe atherosclerotic plaque of the aorta. IMPRESSION: 1. Age-indeterminate worsening of an anterior L1 vertebral body compression fracture now demonstrating at least 60% height loss anteriorly with interval kyphoplasty at this level. (Unclear if the worsened compression fracture happened prior or following the new kyphoplasty at this level). 2. Stable mild compression fracture of L2 vertebral body status post kyphoplasty. 3. No acute displaced fracture of the sacral spine. 4. No traumatic listhesis of the lumbosacral spine. Electronically Signed   By: Iven Finn M.D.   On: 09/28/2019  17:43   CT Head Wo Contrast  Result Date: 09/28/2019 CLINICAL DATA:  Syncope, simple, normal neuro exam. Additional history provided: Patient reports dizziness, syncope on Friday night. EXAM: CT HEAD WITHOUT CONTRAST TECHNIQUE: Contiguous axial images were obtained from the base of the skull through the vertex without intravenous contrast. COMPARISON:  MRI/MRA head 08/25/2018, head CT 08/25/2018. FINDINGS: Brain Stable, moderate generalized parenchymal atrophy. Known chronic lacunar infarcts within the cerebral white matter, basal ganglia, thalami and cerebellum some of which were better appreciated on the prior MRI of 08/25/2018. Stable background mild ill-defined hypoattenuation within the cerebral white matter and pons which is nonspecific, but consistent with chronic small vessel ischemic disease. There is no acute intracranial hemorrhage. No demarcated cortical infarct. No extra-axial fluid collection. No evidence of intracranial mass. No midline shift. Vascular: No hyperdense vessel.  Atherosclerotic calcifications. Skull: Normal. Negative for fracture or focal lesion. Sinuses/Orbits: Visualized orbits show no acute finding. Frothy secretions, mild mucosal thickening and small air-fluid level within the left maxillary sinus. Frothy secretions and moderate mucosal thickening within the right sphenoid sinus. Mild partial opacification of ethmoid air cells. No significant mastoid effusion. IMPRESSION: No evidence of acute intracranial abnormality. Known chronic lacunar infarcts within the cerebral white matter, basal ganglia, thalami and cerebellum. Stable background mild chronic small vessel ischemic disease within the cerebral white matter and pons. Stable moderate generalized parenchymal atrophy. Ethmoid, right sphenoid and left maxillary sinusitis. Electronically Signed   By: Kellie Simmering DO   On: 09/28/2019 16:52   DG Chest Port 1 View  Result Date: 09/28/2019 CLINICAL DATA:  Atrial arrhythmia,  syncopal episode Friday night EXAM: PORTABLE CHEST 1 VIEW COMPARISON:  Radiograph 08/31/2019, CT 05/04/2019 FINDINGS: Chronically coarsened interstitial and bronchitic features  are similar to comparison radiography and suggested to be on the spectrum of UIP on prior high-resolution CT examination. No consolidation, features of edema, pneumothorax, or effusion. The aorta is calcified. The remaining cardiomediastinal contours are unremarkable. No acute osseous or soft tissue abnormality. Degenerative changes are present in the imaged spine and shoulders. Telemetry leads overlie the chest. IMPRESSION: No acute cardiopulmonary abnormality. Chronically coarsened interstitial and bronchitic features are similar to comparison radiography and previously suggested to be on the spectrum of UIP on prior HRCT. Aortic Atherosclerosis (ICD10-I70.0). Electronically Signed   By: Lovena Le M.D.   On: 09/28/2019 16:59   {  Assessment and Plan:   1. PAF - admitted with afib with RVR - 02/2019 EKG does show she was in SR at that time - difficultly with rate control this admission due to soft bp's, had been on dilt gtt now off. SBPs back up to 130s - would avoid amiodarone due to interstitial lung disease. Avoid dig in elderly patient low body weight - would recommend cardioversion, she reports she has not missed any doses of eliquis within 3 weeks. She did eat breakfast around 9AM, I think a cardioversion later in the aftenroon would be reasonable. - cardioversion scehduled for 330pm today, over 6 hours of NPO for solids should be sufficient for cardioversion.   For questions or updates, please contact [place] Please consult [email or web address] for contact info under    Signed, Carlyle Dolly, MD  09/29/2019 10:17 AM

## 2019-09-29 NOTE — Evaluation (Addendum)
Physical Therapy Evaluation Patient Details Name: [name] MRN: [id number] DOB: 1934/01/11 Today's Date: 09/29/2019   History of Present Illness  [name]  is a 86 y.o. female, with past medical history relevant for chronic atrial fibrillation, HTN,,HLD, rheumatoid arthritis, CVA, L1/L2 fracture status post kyphoplasty, chronic lower back pain.    Clinical Impression  The patient's O2 was 91% at rest on RA at beginning of session with pulse of 96bpm. She required mod assist with supine to sit transfer and reported increase in dizziness upon sitting with pulse increasing to 165. After 3 minutes of sitting EOB pulse remained in 160's so patient was brought back to supine position. The pulse hovered around 110 when [facility] was raised slowly. Upon attempting supine to sit transfer min assist with HOB elevated, patient's pulse increased to 170. The pulse decreased to 120 after 2 minutes sitting EOB, but when attempting lateral scoot transfer min assist, patient's pulse jumped back up to 170. Patient performed sit to supine transfer and then with posterior scooting, pulse raised from 120 to 170. The patient was left in bed after therapy today- RN notified on position at EOS and severe tachycardia. PLAN: The patient will continue to benefit from skilled physical therapy services in hospital at recommended venue below in order to improve balance, gait, and ADL's to promote independence in functional activities.     Follow Up Recommendations SNF;Supervision/Assistance - 24 hour    Equipment Recommendations  None recommended by PT    Recommendations for Other Services       Precautions / Restrictions Precautions Precautions: None Restrictions Weight Bearing Restrictions: No      Mobility  Bed Mobility Overal bed mobility: Needs Assistance Bed Mobility: Supine to Sit     Supine to sit: Min assist;Mod assist     General bed mobility comments: trunk and BUE weakness  resulting in  need for min/mod assist, pt dizzy upon sitting EOB  Transfers Overall transfer level: Needs assistance Equipment used: None Transfers: Garment/textile technologist transfers: Min guard  Lateral/Scoot Transfers: Min guard;Min assist General transfer comment: HR at 170 upon sitting EOB and during scoot transfers  Ambulation/Gait                Stairs            Wheelchair Mobility    Modified Rankin (Stroke Patients Only)       Balance Overall balance assessment: Needs assistance Sitting-balance support: Bilateral upper extremity supported;Feet unsupported Sitting balance-Leahy Scale: Fair Sitting balance - Comments: pt slumped and leaning posteriorly upon sitting EOB Postural control: Posterior lean Standing balance support:  (unable due to severe tachycardia sitting EOB)                                 Pertinent Vitals/Pain Pain Assessment: 0-10 Pain Score: 5  Pain Location: chest from heartburn Pain Descriptors / Indicators: Burning;Grimacing;Sore Pain Intervention(s): Limited activity within patient's tolerance;Monitored during session    [place] expects to be discharged to:: Private residence Living Arrangements: Alone Available Help at Discharge: Family;Available 24 hours/day Type of Home: House Home Access: Stairs to enter Entrance Stairs-Rails: Right Entrance Stairs-Number of Steps: 3 Home Layout: One level Home Equipment: Walker - 2 wheels;Cane - single point;Tub bench;Wheelchair - manual Additional Comments: pt uses manual w/c for household mobility, independent in transfers    Prior Function Level of Independence:  Needs assistance   Gait / Transfers Assistance Needed: independent w AD  ADL's / Homemaking Assistance Needed: independent in all ADL's; children do driving and shopping for patient        Hand Dominance   Dominant Hand: Right     Extremity/Trunk Assessment   Upper Extremity Assessment Upper Extremity Assessment: Generalized weakness    Lower Extremity Assessment Lower Extremity Assessment: Generalized weakness    Cervical / Trunk Assessment Cervical / Trunk Assessment: Normal  Communication   Communication: HOH  Cognition Arousal/Alertness: Awake/alert Behavior During Therapy: WFL for tasks assessed/performed Overall Cognitive Status: Within Functional Limits for tasks assessed                                        General Comments      Exercises     Assessment/Plan    PT Assessment Patient needs continued PT services  PT Problem List Decreased strength;Decreased range of motion;Decreased activity tolerance;Decreased balance;Decreased mobility;Decreased knowledge of use of DME;Cardiopulmonary status limiting activity;Pain       PT Treatment Interventions Gait training;Functional mobility training;Therapeutic activities;Therapeutic exercise;Balance training;Patient/family education;Wheelchair mobility training    PT Goals (Current goals can be found in the Care Plan section)  Acute Rehab PT Goals Patient Stated Goal: go home w assist PT Goal Formulation: With patient Time For Goal Achievement: 10/13/19 Potential to Achieve Goals: Good    Frequency Min 3X/week   Barriers to discharge        Co-evaluation               AM-PAC PT "6 Clicks" Mobility  Outcome Measure Help needed turning from your back to your side while in a flat bed without using bedrails?: None Help needed moving from lying on your back to sitting on the side of a flat bed without using bedrails?: A Little Help needed moving to and from a bed to a chair (including a wheelchair)?: A Lot Help needed standing up from a chair using your arms (e.g., wheelchair or bedside chair)?: A Little Help needed to walk in hospital room?: A Lot Help needed climbing 3-5 steps with a railing? : Total 6 Click Score:  15    End of Session   Activity Tolerance: Treatment limited secondary to medical complications (Comment) (extreme tachycardia with bed mobility and upright sitting) Patient left: in bed;with call bell/phone within reach Nurse Communication: Mobility status PT Visit Diagnosis: Unsteadiness on feet (R26.81);Other abnormalities of gait and mobility (R26.89);Repeated falls (R29.6);Muscle weakness (generalized) (M62.81);History of falling (Z91.81);Pain Pain - part of body:  (due to RA )    Time: 6834-1962 PT Time Calculation (min) (ACUTE ONLY): 31 min   Charges:   PT Evaluation $PT Eval Moderate Complexity: 1 Mod PT Treatments $Therapeutic Activity: 23-37 mins        10:04 AM , 09/29/19 Karlyn Agee, SPT Physical Therapy with [facility] [phone] office   During this treatment session, the therapist was present, participating in and directing the treatment.  10:04 AM, 09/29/19 Lonell Grandchild, MPT Physical Therapist with [facility] [phone] office [phone] mobile phone

## 2019-09-29 NOTE — Anesthesia Postprocedure Evaluation (Signed)
Anesthesia Post Note  Patient: [name]  Procedure(s) Performed: CARDIOVERSION (N/A )  Patient location during evaluation: PACU Anesthesia Type: General Level of consciousness: awake and alert and oriented Pain management: pain level controlled Vital Signs Assessment: post-procedure vital signs reviewed and stable Respiratory status: spontaneous breathing and respiratory function stable Cardiovascular status: blood pressure returned to baseline Postop Assessment: no apparent nausea or vomiting Anesthetic complications: no   No complications documented.   Last Vitals:  Vitals:   09/29/19 1719 09/29/19 1720  BP:  102/61  Pulse:  84  Resp:  (!) 28  Temp:    SpO2: 97% 99%    Last Pain:  Vitals:   09/29/19 1712  TempSrc:   PainSc: 0-No pain                 Jaylyne Breese C Levi Klaiber

## 2019-09-29 NOTE — Plan of Care (Addendum)
  Problem: Acute Rehab PT Goals(only PT should resolve) Goal: Pt Will Go Supine/Side To Sit Outcome: Progressing Flowsheets (Taken 09/29/2019 0951) Pt will go Supine/Side to Sit: with modified independence Goal: Patient Will Transfer Sit To/From Stand Outcome: Progressing Flowsheets (Taken 09/29/2019 0951) Patient will transfer sit to/from stand: with min guard assist Goal: Pt Will Transfer Bed To Chair/Chair To Bed Outcome: Progressing Flowsheets (Taken 09/29/2019 0951) Pt will Transfer Bed to Chair/Chair to Bed: min guard assist   9:52 AM , 09/29/19 Karlyn Agee, SPT Physical Therapy with [facility] [phone] office   During this treatment session, the therapist was present, participating in and directing the treatment.  10:04 AM, 09/29/19 Lonell Grandchild, MPT Physical Therapist with [facility] 336 [phone] office [phone] mobile phone

## 2019-09-29 NOTE — Progress Notes (Signed)
*  PRELIMINARY RESULTS* Echocardiogram 2D Echocardiogram has been performed.  [name] 09/29/2019, 10:18 AM

## 2019-09-29 NOTE — Anesthesia Preprocedure Evaluation (Addendum)
Anesthesia Evaluation  Patient identified by MRN, date of birth, ID band Patient awake    Reviewed: Allergy & Precautions, NPO status , Patient's Chart, lab work & pertinent test results  Airway Mallampati: II  TM Distance: >3 FB Neck ROM: Full    Dental  (+) Upper Dentures, Caps, Dental Advisory Given   Pulmonary shortness of breath and with exertion, asthma , pneumonia, resolved,    Pulmonary exam normal breath sounds clear to auscultation       Cardiovascular METS (wheel chair bound): hypertension, Pt. on medications + dysrhythmias Atrial Fibrillation  Rhythm:Irregular Rate:Tachycardia - Systolic murmurs, - Diastolic murmurs, - Friction Rub, - Carotid Bruit, - Peripheral Edema and - Systolic Click 1. Left ventricular ejection fraction, by estimation, is 70 to 75%. The  left ventricle has normal function. The left ventricle has no regional  wall motion abnormalities. Left ventricular diastolic parameters are  indeterminate.  2. Right ventricular systolic function is normal. The right ventricular  size is normal.  3. Left atrial size was mildly dilated.  4. The mitral valve is normal in structure. Trivial mitral valve  regurgitation. No evidence of mitral stenosis.  5. The aortic valve is tricuspid. Aortic valve regurgitation is mild. No  aortic stenosis is present.  6. The inferior vena cava is normal in size with greater than 50%  respiratory variability, suggesting right atrial pressure of 3 mmHg.  28-Sep-2019 11:20:07 [facility] ROUTINE RECORD Atrial flutter with variable A-V block ST & T wave abnormality, consider lateral ischemia Abnormal ECG   Neuro/Psych TIA Neuromuscular disease CVA    GI/Hepatic Neg liver ROS, GERD  Medicated,  Endo/Other  negative endocrine ROS  Renal/GU negative Renal ROS     Musculoskeletal  (+) Arthritis  (back pain), Rheumatoid disorders,    Abdominal    Peds  Hematology negative hematology ROS (+)   Anesthesia Other Findings IMPRESSION: No acute cardiopulmonary abnormality.  Chronically coarsened interstitial and bronchitic features are similar to comparison radiography and previously suggested to be on the spectrum of UIP on prior HRCT.  Aortic Atherosclerosis (ICD10-I70.0).   Electronically Signed   By: Lovena Le M.D.   On: 09/28/2019 16:59  Reproductive/Obstetrics negative OB ROS                           Anesthesia Physical Anesthesia Plan  ASA: IV  Anesthesia Plan: General   Post-op Pain Management:    Induction: Intravenous  PONV Risk Score and Plan: TIVA  Airway Management Planned: Nasal Cannula, Natural Airway and Simple Face Mask  Additional Equipment:   Intra-op Plan:   Post-operative Plan:   Informed Consent: I have reviewed the patients History and Physical, chart, labs and discussed the procedure including the risks, benefits and alternatives for the proposed anesthesia with the patient or authorized representative who has indicated his/her understanding and acceptance.     Dental advisory given  Plan Discussed with: CRNA and Surgeon  Anesthesia Plan Comments:        Anesthesia Quick Evaluation

## 2019-09-29 NOTE — CV Procedure (Signed)
CV procedure note  Procedure: Electrical cardioversion Indication: afib Physician: Dr Carlyle Dolly MD  Patient was brought to the procedure suite after appropriate consent was obtained. Sedation was achieved with the assistance of anesthesiology, for full details please refer to there documentation. Defib pads placed in anterior and posterior positions, succesfully cardioverted from afib with NSR with a single syncyhronized 200j shock. Cardiopulmonary monitoring performed througout procedure, she tolerated well without complications.  Carlyle Dolly MD

## 2019-09-29 NOTE — ED Notes (Signed)
Physical therapy in with patient.

## 2019-09-29 NOTE — ED Notes (Signed)
Pt had 2 more loose stools. Hospitalist notified

## 2019-09-29 NOTE — Consult Note (Signed)
Cardiology Consultation:   Patient ID: MAVI UN MRN: [id number]; DOB: [birth year]  Admit date: 09/28/2019 Date of Consult: 09/29/2019  Primary Care Provider: [name], MD [facility] Cardiologist: Cassandra Sable, MD (Inactive)  [facility] Electrophysiologist:  None    Patient Profile:   [name] is a 86 y.o. female with a hx of PAF, rheumatoid arthritis, interstitial lung disease who is being seen today for the evaluation of afib with RVR at the request of Dr Dyann Kief.  History of Present Illness:   [name] 86 yo female history of PAF, rheumoatid, prior CVA on eliquis and ASA for this reason per notes, HTN, aortic regurgitation, interstitial lung disease, admitted with weakness and syncope. In ER found to be in afib with RVR.    K 36 Cr 0.75 BUN 15 WBC 4.6 Hgb 13 Plt 138 TSH 1.075  COVID neg  EKG afib with RVR CT head no acute process CXR no acute process   Past Medical History:  Diagnosis Date  . A-fib ([place])   . Atrial fibrillation ([place])   . Cervicalgia   . Colon cancer ([place])   . Degeneration of cervical intervertebral disc   . Hypertension   . Pinched nerve in shoulder   . Rheumatoid arthritis(714.0)   . RUQ pain   . Vulvar dystrophy   . Yeast infection     Past Surgical History:  Procedure Laterality Date  . ABDOMINAL HYSTERECTOMY  1960'S  . APPENDECTOMY  with hysterectomy  . BACK SURGERY  12/08/10   plate/screws C3 C4  . CHOLECYSTECTOMY  2002  . COLONOSCOPY  04/02/2009  . COLONOSCOPY  06/04/05  . COLONOSCOPY N/A 07/26/2015   Procedure: COLONOSCOPY;  Surgeon: Rogene Houston, MD;  [facility];  Service: Endoscopy;  Laterality: N/A;  1240  . COLONOSCOPY WITH ESOPHAGOGASTRODUODENOSCOPY (EGD) N/A 04/08/2012   Procedure: COLONOSCOPY WITH ESOPHAGOGASTRODUODENOSCOPY (EGD);  Surgeon: Rogene Houston, MD;  Location: [facility];  Service: Endoscopy;  Laterality: N/A;  130-moved to 245 Ann notified pt  .  ESOPHAGOGASTRODUODENOSCOPY N/A 07/26/2015   Procedure: ESOPHAGOGASTRODUODENOSCOPY (EGD);  Surgeon: Rogene Houston, MD;  Location: [facility];  Service: Endoscopy;  Laterality: N/A;  . HEMICOLECTOMY  10/05      Inpatient Medications: Scheduled Meds: . apixaban  2.5 mg Oral BID  . cholecalciferol  2,000 Units Oral BID  . metoprolol succinate  75 mg Oral Daily  . metoprolol tartrate  5 mg Intravenous Once  . pantoprazole  40 mg Oral Daily  . rosuvastatin  20 mg Oral q1800   Continuous Infusions:  PRN Meds: acetaminophen, ondansetron (ZOFRAN) IV  Allergies:    Allergies  Allergen Reactions  . Iodinated Diagnostic Agents Anaphylaxis    08-12-10 Pt had severe reaction with itching/hives/loss of consciousness within 5 minutes of CEPI today.  She received both Kenalog and Iondinated Contrast, so not certain which agent caused reaction.  If patient returns for another CEPI, Dr. Jobe Igo considers using Decadron and a 13-hour prep.  jkl  . Nerve Block Tray Anaphylaxis    Patient states that she was sent to have Epidural for her shoulder. After rec'ving she remembers saying that she was itching all over  And that's it. She was transported to hospital  By EMS. Stayed overnight.  [place] Spikes Derivatives Hives and Nausea And Vomiting  . Triamcinolone Acetonide Anaphylaxis    08-12-10  Pt had severe reaction with itching/hives/loss of consciousness within five minutes of CEPI today.  She received both Kenalog  and Iodinated Contrast, so not certain which agent caused reaction.  If pt returns for another cervical epidural steroid injection, Dr. Jobe Igo suggests using Decadron and a 13-hr prep.  JKL PATIENT USES PROCTOCREAM 2.5% WITHOUT DIFFICULTY FOR VULVAR DYSTROPHY Patient says she can take prednisone without any problem  . Vibra-Tab [Doxycycline] Nausea And Vomiting    Social History:   Social History   Socioeconomic History  . Marital status: Married    Spouse name: Not on file  . Number  of children: Not on file  . Years of education: Not on file  . Highest education level: Not on file  Occupational History  . Not on file  Tobacco Use  . Smoking status: Never Smoker  . Smokeless tobacco: Never Used  Vaping Use  . Vaping Use: Never used  Substance and Sexual Activity  . Alcohol use: No  . Drug use: No  . Sexual activity: Never  Other Topics Concern  . Not on file  Social History Narrative   Nonsmoker.  She lives alone.  Her son calls her every day to check on her.  Has a daughter involved in care as well.     Social Determinants of Health   Financial Resource Strain:   . Difficulty of Paying Living Expenses: Not on file  Food Insecurity:   . Worried About Charity fundraiser in the Last Year: Not on file  . Ran Out of Food in the Last Year: Not on file  Transportation Needs:   . Lack of Transportation (Medical): Not on file  . Lack of Transportation (Non-Medical): Not on file  Physical Activity:   . Days of Exercise per Week: Not on file  . Minutes of Exercise per Session: Not on file  Stress:   . Feeling of Stress : Not on file  Social Connections:   . Frequency of Communication with Friends and Family: Not on file  . Frequency of Social Gatherings with Friends and Family: Not on file  . Attends Religious Services: Not on file  . Active Member of Clubs or Organizations: Not on file  . Attends Archivist Meetings: Not on file  . Marital Status: Not on file  Intimate Partner Violence:   . Fear of Current or Ex-Partner: Not on file  . Emotionally Abused: Not on file  . Physically Abused: Not on file  . Sexually Abused: Not on file    Family History:    Family History  Problem Relation Age of Onset  . Rheum arthritis Mother   . Parkinsonism Father   . Hiatal hernia Sister   . Healthy Brother   . Healthy Daughter   . Healthy Son   . Healthy Son      ROS:  Please see the history of present illness.   All other ROS reviewed and  negative.     Physical Exam/Data:   Vitals:   09/29/19 0800 09/29/19 0930 09/29/19 0945 09/29/19 1000  BP: 133/63     Pulse: 98 (!) 113 (!) 105 (!) 112  Resp: (!) 25 (!) 29 (!) 23 (!) 27  Temp:      TempSrc:      SpO2: 95% 93% 94% 93%  Weight:      Height:       No intake or output data in the 24 hours ending 09/29/19 1017 Last 3 Weights 09/28/2019 08/31/2019 03/08/2019  Weight (lbs) 120 lb 123 lb 3.2 oz 124 lb  Weight (kg) 54.432 kg 55.883  kg 56.246 kg     Body mass index is 21.95 kg/m.  General:  Well nourished, well developed, in no acute distress HEENT: normal Lymph: no adenopathy Neck: no JVD Endocrine:  No thryomegaly Vascular: No carotid bruits; FA pulses 2+ bilaterally without bruits  Cardiac:  Irreg, tachy Lungs:  clear to auscultation bilaterally, no wheezing, rhonchi or rales  Abd: soft, nontender, no hepatomegaly  Ext: no edema Musculoskeletal:  No deformities, BUE and BLE strength normal and equal Skin: warm and dry  Neuro:  CNs 2-12 intact, no focal abnormalities noted Psych:  Normal affect     Laboratory Data:  High Sensitivity Troponin:  No results for input(s): TROPONINIHS in the last 720 hours.   Chemistry Recent Labs  Lab 09/28/19 1242 09/29/19 0252  NA 141 139  K 3.6 3.7  CL 101 105  CO2 26 27  GLUCOSE 90 100*  BUN 15 15  CREATININE 0.75 0.63  CALCIUM 8.4* 8.0*  GFRNONAA >60 >60  GFRAA >60 >60  ANIONGAP 14 7    No results for input(s): PROT, ALBUMIN, AST, ALT, ALKPHOS, BILITOT in the last 168 hours. Hematology Recent Labs  Lab 09/28/19 1242 09/29/19 0252  WBC 4.6 3.9*  RBC 4.33 4.08  HGB 13.0 12.4  HCT 42.0 38.5  MCV 97.0 94.4  MCH 30.0 30.4  MCHC 31.0 32.2  RDW 14.7 14.7  PLT 138* 132*   BNPNo results for input(s): BNP, PROBNP in the last 168 hours.  DDimer No results for input(s): DDIMER in the last 168 hours.   Radiology/Studies:  DG Lumbar Spine Complete  Result Date: 09/28/2019 CLINICAL DATA:  Status post fall  presenting with lower back pain and right leg pain. EXAM: LUMBAR SPINE - COMPLETE 4+ VIEW; SACRUM AND COCCYX - 2+ VIEW COMPARISON:  X-ray lumbar spine 04/20/2017. x-ray fluoro guided kyphoplasty 04/28/2017 report WITHOUT images FINDINGS: Five non-rib-bearing lumbar vertebral bodies. Partial visualization of the sacrum due to overlying bowel. Slight dextrocurvature of the thoracolumbar spine is again noted. Alignment is otherwise normal. Worsened anterior L1 vertebral body compression fracture demonstrating at least 60% (from 20%) height loss with interval kyphoplasty. Stable mild compression fracture of the L2 vertebral body with kyphoplasty at this level again noted. There is no evidence of acute displaced sacral spine fracture with the visualized sacral arcuate lines appearing intact. Multilevel mild intervertebral disc space narrowing. Multilevel degenerative changes of the spine with osteophyte formation and moderate severe facet arthropathy. Surgical clips and bowel anastomotic sutures overlie the pelvis. Severe atherosclerotic plaque of the aorta. IMPRESSION: 1. Age-indeterminate worsening of an anterior L1 vertebral body compression fracture now demonstrating at least 60% height loss anteriorly with interval kyphoplasty at this level. (Unclear if the worsened compression fracture happened prior or following the new kyphoplasty at this level). 2. Stable mild compression fracture of L2 vertebral body status post kyphoplasty. 3. No acute displaced fracture of the sacral spine. 4. No traumatic listhesis of the lumbosacral spine. Electronically Signed   By: Iven Finn M.D.   On: 09/28/2019 17:43   DG Sacrum/Coccyx  Result Date: 09/28/2019 CLINICAL DATA:  Status post fall presenting with lower back pain and right leg pain. EXAM: LUMBAR SPINE - COMPLETE 4+ VIEW; SACRUM AND COCCYX - 2+ VIEW COMPARISON:  X-ray lumbar spine 04/20/2017. x-ray fluoro guided kyphoplasty 04/28/2017 report WITHOUT images FINDINGS:  Five non-rib-bearing lumbar vertebral bodies. Partial visualization of the sacrum due to overlying bowel. Slight dextrocurvature of the thoracolumbar spine is again noted. Alignment is otherwise normal. Worsened  anterior L1 vertebral body compression fracture demonstrating at least 60% (from 20%) height loss with interval kyphoplasty. Stable mild compression fracture of the L2 vertebral body with kyphoplasty at this level again noted. There is no evidence of acute displaced sacral spine fracture with the visualized sacral arcuate lines appearing intact. Multilevel mild intervertebral disc space narrowing. Multilevel degenerative changes of the spine with osteophyte formation and moderate severe facet arthropathy. Surgical clips and bowel anastomotic sutures overlie the pelvis. Severe atherosclerotic plaque of the aorta. IMPRESSION: 1. Age-indeterminate worsening of an anterior L1 vertebral body compression fracture now demonstrating at least 60% height loss anteriorly with interval kyphoplasty at this level. (Unclear if the worsened compression fracture happened prior or following the new kyphoplasty at this level). 2. Stable mild compression fracture of L2 vertebral body status post kyphoplasty. 3. No acute displaced fracture of the sacral spine. 4. No traumatic listhesis of the lumbosacral spine. Electronically Signed   By: Iven Finn M.D.   On: 09/28/2019 17:43   CT Head Wo Contrast  Result Date: 09/28/2019 CLINICAL DATA:  Syncope, simple, normal neuro exam. Additional history provided: Patient reports dizziness, syncope on Friday night. EXAM: CT HEAD WITHOUT CONTRAST TECHNIQUE: Contiguous axial images were obtained from the base of the skull through the vertex without intravenous contrast. COMPARISON:  MRI/MRA head 08/25/2018, head CT 08/25/2018. FINDINGS: Brain Stable, moderate generalized parenchymal atrophy. Known chronic lacunar infarcts within the cerebral white matter, basal ganglia, thalami and  cerebellum some of which were better appreciated on the prior MRI of 08/25/2018. Stable background mild ill-defined hypoattenuation within the cerebral white matter and pons which is nonspecific, but consistent with chronic small vessel ischemic disease. There is no acute intracranial hemorrhage. No demarcated cortical infarct. No extra-axial fluid collection. No evidence of intracranial mass. No midline shift. Vascular: No hyperdense vessel.  Atherosclerotic calcifications. Skull: Normal. Negative for fracture or focal lesion. Sinuses/Orbits: Visualized orbits show no acute finding. Frothy secretions, mild mucosal thickening and small air-fluid level within the left maxillary sinus. Frothy secretions and moderate mucosal thickening within the right sphenoid sinus. Mild partial opacification of ethmoid air cells. No significant mastoid effusion. IMPRESSION: No evidence of acute intracranial abnormality. Known chronic lacunar infarcts within the cerebral white matter, basal ganglia, thalami and cerebellum. Stable background mild chronic small vessel ischemic disease within the cerebral white matter and pons. Stable moderate generalized parenchymal atrophy. Ethmoid, right sphenoid and left maxillary sinusitis. Electronically Signed   By: Kellie Simmering DO   On: 09/28/2019 16:52   DG Chest Port 1 View  Result Date: 09/28/2019 CLINICAL DATA:  Atrial arrhythmia, syncopal episode Friday night EXAM: PORTABLE CHEST 1 VIEW COMPARISON:  Radiograph 08/31/2019, CT 05/04/2019 FINDINGS: Chronically coarsened interstitial and bronchitic features are similar to comparison radiography and suggested to be on the spectrum of UIP on prior high-resolution CT examination. No consolidation, features of edema, pneumothorax, or effusion. The aorta is calcified. The remaining cardiomediastinal contours are unremarkable. No acute osseous or soft tissue abnormality. Degenerative changes are present in the imaged spine and shoulders.  Telemetry leads overlie the chest. IMPRESSION: No acute cardiopulmonary abnormality. Chronically coarsened interstitial and bronchitic features are similar to comparison radiography and previously suggested to be on the spectrum of UIP on prior HRCT. Aortic Atherosclerosis (ICD10-I70.0). Electronically Signed   By: Lovena Le M.D.   On: 09/28/2019 16:59   {  Assessment and Plan:   1. PAF - admitted with afib with RVR - 02/2019 EKG does show she was in SR at  that time - difficultly with rate control this admission due to soft bp's, had been on dilt gtt now off. SBPs back up to 130s - would avoid amiodarone due to interstitial lung disease. Avoid dig in elderly patient low body weight - would recommend cardioversion, she reports she has not missed any doses of eliquis within 3 weeks. She did eat breakfast around 9AM, I think a cardioversion later in the aftenroon would be reasonable. - cardioversion scehduled for 330pm today, over 6 hours of NPO for solids should be sufficient for cardioversion.   For questions or updates, please contact [place] Please consult [email or web address] for contact info under    Signed, Carlyle Dolly, MD  09/29/2019 10:17 AM

## 2019-09-29 NOTE — Progress Notes (Signed)
Electrical Cardioversion Procedure Note [name] [id number] 12-17-1933  Procedure: Electrical Cardioversion Indications:  Atrial Fibrillation  Procedure Details Consent: Risks of procedure as well as the alternatives and risks of each were explained to the (patient/caregiver).  Consent for procedure obtained. Time Out: Verified patient identification, verified procedure, site/side was marked, verified correct patient position, special equipment/implants available, medications/allergies/relevent history reviewed, required imaging and test results available.  1646  Patient placed on cardiac monitor, pulse oximetry, supplemental oxygen as necessary.  Sedation given: Per Anesthesia note Pacer pads placed anterior and posterior chest.  Cardioverted 1 time(s).  Cardioverted at [place].  Evaluation Findings: Post procedure EKG shows: NSR Complications: None Patient did tolerate procedure well.   Elizabeth Sauer 09/29/2019, 4:57 PM

## 2019-09-29 NOTE — ED Notes (Signed)
Pt has had 3 loose, mucous like stools. Hospitalist notified and does not want any specimen collected at this time

## 2019-09-29 NOTE — ED Notes (Signed)
Bladder scan 441. Pt stated hurts to push on bladder. Discomfort feeling so full.

## 2019-09-29 NOTE — Progress Notes (Signed)
PROGRESS NOTE    [name]  WVP:[id number] DOB: [birth year] DOA: 09/28/2019 PCP: [name], MD    Chief Complaint  Patient presents with  . Loss of Consciousness    Brief Narrative:   [name]  is a 86 y.o. female, with past medical history relevant for chronic atrial fibrillation, HTN,,HLD, rheumatoid arthritis, CVA, L1/L2 fracture status post kyphoplasty, chronic lower back pain. -Patient presents to ED secondary to worsening lower back pain, and some lightheadedness, upon further questioning she does report she had syncope last Friday, reports when she stood up walked out of the room, she felt lightheaded and she lost consciousness and collapsed,, reports this was accompanied by some nausea, she denies any headache, or head trauma, she denies any chest pain, report she is at baseline with exertional dyspnea which has not worsened, but she did report some worsening lower back pain which prompted her to come to ED, she denies any new focal tingling, weakness, deficits, no urinary symptoms, no fever or chills, reports she has been compliant with her medications. - in ED she was noted to be in A. fib with RVR, heart rate in the 140s, she was started on Cardizem drip, heart rate has improved, but this did bring her blood pressure down, her work-up was significant for stable renal function, stable hemoglobin, given she is on Cardizem drip in A. fib with RVR triage hospitalist consulted to admit, by the time of my evaluation, her heart rate has improved to mid 80s, blood pressure was 97/54, so I have instructed them to hold her Cardizem drip.   Assessment & Plan: 1-atrial fibrillation with RVR -partially controlled initially with use of cardizem drip; BP became soft and drip stopped with resumption of home medication (metoprolol); since them this morning HR is up and uncontrolled again; continue to be on A. Fib. -case discussed with cardiology service and plan is for  cardioversion later today -will follow echo results and continue eliquis for secondary prevention   2-chronic back pain -continue analgesics  3-physical deconditioning and weakness -PT recommending SNF -patient and family will discussed for final decisions; she is currently declining SNF.   4-GERD/nausea -will continue PPI and PRN antiemetics  5-HLD -continue statins   6-HTN -soft earlier with abnormal rate/rhythm and use of cardizem drip -continue b-blocker for now -follow VS  7-Chronic anticoagulation/Eliquis for Afib -continue to follow CBC intermittently -no signs of overt bleeding    DVT prophylaxis: eliquis Code Status: full code Family Communication: Son Jeani Hawking Kimbley) over the phone  Disposition:   Status is: inpatient   Dispo: The patient is from: home               Anticipated d/c is to:to be determine              Anticipated d/c date is: 1-2 days, longer if SNF placement needed.               Patient currently is not medically stable for discharge, short winded and with uncontrolled A. Fib; after discussing with cardiology service will pursuit cardioversion and follow clinical response. Patient was found weak/deconditioned and recommended for SNF at discharge.    Consultants:   Cardiology service.   Procedures:  See below for x-ray reports 2-D echo: pending -cardioversion planned for later today 09/29/19   Antimicrobials:  None    Subjective: Weak and deconditioned. Denying CP, complaining of palpitations, reflux symptoms and short winded sensation with activity.   Objective: Vitals:  09/29/19 0930 09/29/19 0945 09/29/19 1000 09/29/19 1045  BP:      Pulse: (!) 113 (!) 105 (!) 112 (!) 109  Resp: (!) 29 (!) 23 (!) 27 (!) 24  Temp:      TempSrc:      SpO2: 93% 94% 93% 93%  Weight:      Height:       No intake or output data in the 24 hours ending 09/29/19 1115 Filed Weights   09/28/19 1118  Weight: 54.4 kg    Examination:  General  exam: Appears in mild distress, reporting SOB with activity and experiencing palpitations and reflux symptoms. No CP. Respiratory system: scattered rhonchi, no wheezing, o frank crackles, no requiring oxygen supplementation.  Cardiovascular system: irregular, irregular, no rubs, no gallops, no murmur. no pedal edema. Gastrointestinal system: Abdomen is nondistended, soft and nontender. No organomegaly or masses felt. Normal bowel sounds heard. Central nervous system: Alert and oriented. No focal neurological deficits. Extremities: no cyanosis, no clubbing Skin: No rashes, no petechiae.  Psychiatry: Judgement and insight appear normal. Mood & affect appropriate.     Data Reviewed: I have personally reviewed following labs and imaging studies  CBC: Recent Labs  Lab 09/28/19 1242 09/29/19 0252  WBC 4.6 3.9*  HGB 13.0 12.4  HCT 42.0 38.5  MCV 97.0 94.4  PLT 138* 132*    Basic Metabolic Panel: Recent Labs  Lab 09/28/19 1242 09/29/19 0252  NA 141 139  K 3.6 3.7  CL 101 105  CO2 26 27  GLUCOSE 90 100*  BUN 15 15  CREATININE 0.75 0.63  CALCIUM 8.4* 8.0*    GFR: Estimated Creatinine Clearance: 41.4 mL/min (by C-G formula based on SCr of 0.63 mg/dL).   Recent Results (from the past 240 hour(s))  SARS Coronavirus 2 by RT PCR (hospital order, performed in [facility] hospital lab) Nasopharyngeal Nasopharyngeal Swab     Status: None   Collection Time: 09/28/19  6:33 PM   Specimen: Nasopharyngeal Swab  Result Value Ref Range Status   SARS Coronavirus 2 NEGATIVE NEGATIVE Final    Comment: (NOTE) SARS-CoV-2 target nucleic acids are NOT DETECTED.  The SARS-CoV-2 RNA is generally detectable in upper and lower respiratory specimens during the acute phase of infection. The lowest concentration of SARS-CoV-2 viral copies this assay can detect is 250 copies / mL. A negative result does not preclude SARS-CoV-2 infection and should not be used as the sole basis for treatment or  other patient management decisions.  A negative result may occur with improper specimen collection / handling, submission of specimen other than nasopharyngeal swab, presence of viral mutation(s) within the areas targeted by this assay, and inadequate number of viral copies (<250 copies / mL). A negative result must be combined with clinical observations, patient history, and epidemiological information.  Fact Sheet for Patients:   StrictlyIdeas.no  Fact Sheet for Healthcare Providers: BankingDealers.co.za  This test is not yet approved or  cleared by the [place] FDA and has been authorized for detection and/or diagnosis of SARS-CoV-2 by FDA under an Emergency Use Authorization (EUA).  This EUA will remain in effect (meaning this test can be used) for the duration of the COVID-19 declaration under Section 564(b)(1) of the Act, 21 U.S.C. section 360bbb-3(b)(1), unless the authorization is terminated or revoked sooner.  Performed at [facility], [address] [place], [place] [address]      Radiology Studies: DG Lumbar Spine Complete  Result Date: 09/28/2019 CLINICAL DATA:  Status post  fall presenting with lower back pain and right leg pain. EXAM: LUMBAR SPINE - COMPLETE 4+ VIEW; SACRUM AND COCCYX - 2+ VIEW COMPARISON:  X-ray lumbar spine 04/20/2017. x-ray fluoro guided kyphoplasty 04/28/2017 report WITHOUT images FINDINGS: Five non-rib-bearing lumbar vertebral bodies. Partial visualization of the sacrum due to overlying bowel. Slight dextrocurvature of the thoracolumbar spine is again noted. Alignment is otherwise normal. Worsened anterior L1 vertebral body compression fracture demonstrating at least 60% (from 20%) height loss with interval kyphoplasty. Stable mild compression fracture of the L2 vertebral body with kyphoplasty at this level again noted. There is no evidence of acute displaced sacral spine fracture with the visualized  sacral arcuate lines appearing intact. Multilevel mild intervertebral disc space narrowing. Multilevel degenerative changes of the spine with osteophyte formation and moderate severe facet arthropathy. Surgical clips and bowel anastomotic sutures overlie the pelvis. Severe atherosclerotic plaque of the aorta. IMPRESSION: 1. Age-indeterminate worsening of an anterior L1 vertebral body compression fracture now demonstrating at least 60% height loss anteriorly with interval kyphoplasty at this level. (Unclear if the worsened compression fracture happened prior or following the new kyphoplasty at this level). 2. Stable mild compression fracture of L2 vertebral body status post kyphoplasty. 3. No acute displaced fracture of the sacral spine. 4. No traumatic listhesis of the lumbosacral spine. Electronically Signed   By: Iven Finn M.D.   On: 09/28/2019 17:43   DG Sacrum/Coccyx  Result Date: 09/28/2019 CLINICAL DATA:  Status post fall presenting with lower back pain and right leg pain. EXAM: LUMBAR SPINE - COMPLETE 4+ VIEW; SACRUM AND COCCYX - 2+ VIEW COMPARISON:  X-ray lumbar spine 04/20/2017. x-ray fluoro guided kyphoplasty 04/28/2017 report WITHOUT images FINDINGS: Five non-rib-bearing lumbar vertebral bodies. Partial visualization of the sacrum due to overlying bowel. Slight dextrocurvature of the thoracolumbar spine is again noted. Alignment is otherwise normal. Worsened anterior L1 vertebral body compression fracture demonstrating at least 60% (from 20%) height loss with interval kyphoplasty. Stable mild compression fracture of the L2 vertebral body with kyphoplasty at this level again noted. There is no evidence of acute displaced sacral spine fracture with the visualized sacral arcuate lines appearing intact. Multilevel mild intervertebral disc space narrowing. Multilevel degenerative changes of the spine with osteophyte formation and moderate severe facet arthropathy. Surgical clips and bowel anastomotic  sutures overlie the pelvis. Severe atherosclerotic plaque of the aorta. IMPRESSION: 1. Age-indeterminate worsening of an anterior L1 vertebral body compression fracture now demonstrating at least 60% height loss anteriorly with interval kyphoplasty at this level. (Unclear if the worsened compression fracture happened prior or following the new kyphoplasty at this level). 2. Stable mild compression fracture of L2 vertebral body status post kyphoplasty. 3. No acute displaced fracture of the sacral spine. 4. No traumatic listhesis of the lumbosacral spine. Electronically Signed   By: Iven Finn M.D.   On: 09/28/2019 17:43   CT Head Wo Contrast  Result Date: 09/28/2019 CLINICAL DATA:  Syncope, simple, normal neuro exam. Additional history provided: Patient reports dizziness, syncope on Friday night. EXAM: CT HEAD WITHOUT CONTRAST TECHNIQUE: Contiguous axial images were obtained from the base of the skull through the vertex without intravenous contrast. COMPARISON:  MRI/MRA head 08/25/2018, head CT 08/25/2018. FINDINGS: Brain Stable, moderate generalized parenchymal atrophy. Known chronic lacunar infarcts within the cerebral white matter, basal ganglia, thalami and cerebellum some of which were better appreciated on the prior MRI of 08/25/2018. Stable background mild ill-defined hypoattenuation within the cerebral white matter and pons which is nonspecific, but consistent with chronic  small vessel ischemic disease. There is no acute intracranial hemorrhage. No demarcated cortical infarct. No extra-axial fluid collection. No evidence of intracranial mass. No midline shift. Vascular: No hyperdense vessel.  Atherosclerotic calcifications. Skull: Normal. Negative for fracture or focal lesion. Sinuses/Orbits: Visualized orbits show no acute finding. Frothy secretions, mild mucosal thickening and small air-fluid level within the left maxillary sinus. Frothy secretions and moderate mucosal thickening within the right  sphenoid sinus. Mild partial opacification of ethmoid air cells. No significant mastoid effusion. IMPRESSION: No evidence of acute intracranial abnormality. Known chronic lacunar infarcts within the cerebral white matter, basal ganglia, thalami and cerebellum. Stable background mild chronic small vessel ischemic disease within the cerebral white matter and pons. Stable moderate generalized parenchymal atrophy. Ethmoid, right sphenoid and left maxillary sinusitis. Electronically Signed   By: Kellie Simmering DO   On: 09/28/2019 16:52   DG Chest Port 1 View  Result Date: 09/28/2019 CLINICAL DATA:  Atrial arrhythmia, syncopal episode Friday night EXAM: PORTABLE CHEST 1 VIEW COMPARISON:  Radiograph 08/31/2019, CT 05/04/2019 FINDINGS: Chronically coarsened interstitial and bronchitic features are similar to comparison radiography and suggested to be on the spectrum of UIP on prior high-resolution CT examination. No consolidation, features of edema, pneumothorax, or effusion. The aorta is calcified. The remaining cardiomediastinal contours are unremarkable. No acute osseous or soft tissue abnormality. Degenerative changes are present in the imaged spine and shoulders. Telemetry leads overlie the chest. IMPRESSION: No acute cardiopulmonary abnormality. Chronically coarsened interstitial and bronchitic features are similar to comparison radiography and previously suggested to be on the spectrum of UIP on prior HRCT. Aortic Atherosclerosis (ICD10-I70.0). Electronically Signed   By: Lovena Le M.D.   On: 09/28/2019 16:59    Scheduled Meds: . alum & mag hydroxide-simeth  15 mL Oral Once  . apixaban  2.5 mg Oral BID  . cholecalciferol  2,000 Units Oral BID  . metoprolol succinate  75 mg Oral Daily  . metoprolol tartrate  5 mg Intravenous Once  . pantoprazole  40 mg Oral Daily  . rosuvastatin  20 mg Oral q1800   Continuous Infusions:   LOS: 0 days    Time spent: 35 minutes    Barton Dubois, MD [facility]   To contact the attending provider between 7A-7P or the covering provider during after hours 7P-7A, please log into the web site [email or web address] and access using universal [place] password for that web site. If you do not have the password, please call the hospital operator.  09/29/2019, 11:15 AM

## 2019-09-29 NOTE — ED Notes (Signed)
Pt taken to PACU for cardioversion

## 2019-09-30 DIAGNOSIS — R5381 Other malaise: Secondary | ICD-10-CM

## 2019-09-30 LAB — GASTROINTESTINAL PANEL BY PCR, STOOL (REPLACES STOOL CULTURE)

## 2019-09-30 MED ORDER — HYDROCODONE-ACETAMINOPHEN 7.5-325 MG PO TABS
1.0000 | ORAL_TABLET | Freq: Once | ORAL | Status: AC
  Administered 2019-09-30: 1 via ORAL
  Filled 2019-09-30: qty 1

## 2019-09-30 NOTE — Progress Notes (Signed)
Nsg Discharge Note  Admit Date:  09/28/2019 Discharge date: 09/30/2019   [place] to be D/C'd Home per MD order.  AVS completed.  Copy for chart, and copy for patient signed, and dated. Removed IV and flexiseal. Reviewed d/c paperwork with patient and daughter. Answered all questions. Wheeled stable patient and belongings to main entrance where she was picked up by her daughter. Patient/caregiver able to verbalize understanding.  Discharge Medication: Allergies as of 09/30/2019      Reactions   Iodinated Diagnostic Agents Anaphylaxis   08-12-10 Pt had severe reaction with itching/hives/loss of consciousness within 5 minutes of CEPI today.  She received both Kenalog and Iondinated Contrast, so not certain which agent caused reaction.  If patient returns for another CEPI, Dr. Jobe Igo considers using Decadron and a 13-hour prep.  jkl   Nerve Block Tray Anaphylaxis   Patient states that she was sent to have Epidural for her shoulder. After rec'ving she remembers saying that she was itching all over  And that's it. She was transported to hospital  By EMS. Stayed overnight.   Nitrofuran Derivatives Hives, Nausea And Vomiting   Triamcinolone Acetonide Anaphylaxis   08-12-10  Pt had severe reaction with itching/hives/loss of consciousness within five minutes of CEPI today.  She received both Kenalog and Iodinated Contrast, so not certain which agent caused reaction.  If pt returns for another cervical epidural steroid injection, Dr. Jobe Igo suggests using Decadron and a 13-hr prep.  JKL PATIENT USES PROCTOCREAM 2.5% WITHOUT DIFFICULTY FOR VULVAR DYSTROPHY Patient says she can take prednisone without any problem   Vibra-tab [doxycycline] Nausea And Vomiting      Medication List    TAKE these medications   acetaminophen 325 MG tablet Commonly known as: TYLENOL Take 2 tablets (650 mg total) by mouth every 6 (six) hours as needed for mild pain or headache (or Fever >/= 101).   apixaban 2.5 MG  Tabs tablet Commonly known as: ELIQUIS Take 1 tablet (2.5 mg total) by mouth 2 (two) times daily.   aspirin 81 MG chewable tablet Chew 1 tablet (81 mg total) by mouth daily. What changed: when to take this   cholecalciferol 25 MCG (1000 UNIT) tablet Commonly known as: VITAMIN D3 Take 2,000 Units by mouth 2 (two) times daily.   HYDROcodone-acetaminophen 7.5-325 MG tablet Commonly known as: NORCO Take 1 tablet by mouth 3 (three) times daily as needed for moderate pain.   Lastacaft 0.25 % Soln Generic drug: Alcaftadine Apply 1 drop to eye 2 (two) times daily.   leflunomide 20 MG tablet Commonly known as: ARAVA Take 1 tablet (20 mg total) by mouth daily.   meclizine 12.5 MG tablet Commonly known as: ANTIVERT Take 1 tablet (12.5 mg total) by mouth 2 (two) times daily as needed for dizziness.   metoprolol succinate 25 MG 24 hr tablet Commonly known as: TOPROL-XL Take 3 tablets (75 mg total) by mouth daily.   omeprazole 20 MG capsule Commonly known as: PRILOSEC TAKE 1 CAPSULE BY MOUTH TWICE DAILY   rosuvastatin 20 MG tablet Commonly known as: CRESTOR Take 1 tablet (20 mg total) by mouth daily at 6 PM.       Discharge Assessment: Vitals:   09/30/19 0654 09/30/19 1500  BP: (!) 145/73 138/78  Pulse: 91   Resp: 18   Temp: 98 F (36.7 C) 98.6 F (37 C)  SpO2: 93% 94%   Skin clean, dry and intact without evidence of skin break down, no evidence of skin tears  noted. IV catheter discontinued intact. Site without signs and symptoms of complications - no redness or edema noted at insertion site, patient denies c/o pain - only slight tenderness at site.  Dressing with slight pressure applied.  D/c Instructions-Education: Discharge instructions given to patient/family with verbalized understanding. D/c education completed with patient/family including follow up instructions, medication list, d/c activities limitations if indicated, with other d/c instructions as indicated by MD  - patient able to verbalize understanding, all questions fully answered. Patient instructed to return to ED, call 911, or call MD for any changes in condition.  Patient escorted via [place], and D/C home via private auto.  Santa Lighter, RN 09/30/2019 5:20 PM

## 2019-09-30 NOTE — Plan of Care (Signed)

## 2019-09-30 NOTE — TOC Initial Note (Signed)
Transition of Care [facility]) - Initial/Assessment Note    Patient Details  Name: [name] MRN: [id number] Date of Birth: 09/09/1933  Transition of Care [facility] CM/SW Contact:    Natasha Bence, LCSW Phone Number: 09/30/2019, 9:49 AM  Clinical Narrative:                  Patient is an 86 year old female admitted for Atrial fibrillation with RVR. CSW observed SNF consult and contacted patient inquiring about patients referral request for SNF. Patient declined SNF stating that she "rest better at home." Patient reported that she had previously had SNF and did not want to be placed with one again. Patient also reported that she had communicated her refusal of SNF to the nurse. CSW inquired about referring patient to [facility]. Patient declined [facility] referral. TOC to follow.    Barriers to Discharge: Continued Medical Work up   Patient Goals and CMS Choice        Expected Discharge Plan and Services                                                Prior Living Arrangements/Services                       Activities of Daily Living Home Assistive Devices/Equipment: Wheelchair, Dentures (specify type), Shower chair with back ADL Screening (condition at time of admission) Patient's cognitive ability adequate to safely complete daily activities?: Yes Is the patient deaf or have difficulty hearing?: No Does the patient have difficulty seeing, even when wearing glasses/contacts?: No Does the patient have difficulty concentrating, remembering, or making decisions?: No Patient able to express need for assistance with ADLs?: Yes Does the patient have difficulty dressing or bathing?: No Independently performs ADLs?: Yes (appropriate for developmental age) Does the patient have difficulty walking or climbing stairs?: Yes Weakness of Legs: Both Weakness of Arms/Hands: None  Permission Sought/Granted                  Emotional Assessment              Admission  diagnosis:  Atrial fibrillation with rapid ventricular response ([place]) [I48.91] Atrial fibrillation with RVR ([place]) [I48.91] Patient Active Problem List   Diagnosis Date Noted  . Atrial fibrillation with RVR ([place]) 09/28/2019  . ILD (interstitial lung disease) ([place]) 08/31/2019  . Shortness of breath 08/31/2019  . Vitamin D deficiency 09/18/2018  . Chronic back pain greater than 3 months duration 09/06/2018  . GERD without esophagitis 09/04/2018  . Chronic vertigo 09/04/2018  . Hypomagnesemia 09/04/2018  . Rheumatoid arthritis ([place])   . TIA (transient ischemic attack) 08/25/2018  . CVA (cerebral vascular accident) Acute subcentimeter infarction in the right [place] 08/25/2018  . Chronic anticoagulation/Eliquis for Afib 08/25/2018  . Acute CVA (cerebrovascular accident) ([place]) 08/25/2018  . CAP (community acquired pneumonia) 02/24/2017  . Acute respiratory failure with hypoxia ([place]) 02/23/2017  . Bronchitis 02/22/2017  . Hypoxia 02/22/2017  . Generalized weakness 02/22/2017  . RAD (reactive airway disease) with wheezing, mild intermittent, with acute exacerbation 02/22/2017  . Chronic pain 02/22/2017  . Vulvar dystrophy 05/23/2014  . Atrial fibrillation ([place]) 08/10/2013  . Essential hypertension 07/04/2013  . Tachycardia 01/31/2013  . Dystrophy, vulva 05/04/2012  . Abdominal pain, epigastric 03/28/2012  . Dyspepsia 04/14/2011  . History of  colon cancer 03/17/2011  . Diarrhea 03/17/2011  . HLD (hyperlipidemia) 11/30/2006  . BRONCHIECTASIS 11/30/2006  . RHEUMATOID ARTHRITIS 11/30/2006   PCP:  [name], MD Pharmacy:   [facility], [place] [address] [place] [address] [place] [address] Phone: [phone] Fax: [phone]  [facility], [place], [address] [place], [address] [place] [address] Phone: [phone] Fax: [phone]  [facility] - [place], [place] - [address] [place] [address] Phone: [phone] Fax: [phone]     Social Determinants of Health (SDOH) Interventions    Readmission Risk Interventions No flowsheet data found.

## 2019-09-30 NOTE — Discharge Summary (Signed)
Physician Discharge Summary  [name] DVV:[id number] DOB: Jan 07, 1934 DOA: 09/28/2019  PCP: [name], MD  Admit date: 09/28/2019 Discharge date: 09/30/2019  Time spent: 35 minutes  Recommendations for Outpatient Follow-up:  1. Repeat CBC to follow hemoglobin trend 2. Repeat basic metabolic panel to follow across renal function 3. Reassess blood pressure and stability.   Discharge Diagnoses:  Active Problems:   HLD (hyperlipidemia)   Essential hypertension   Atrial fibrillation (HCC)   Chronic anticoagulation/Eliquis for Afib   GERD without esophagitis   Chronic back pain greater than 3 months duration   Atrial fibrillation with rapid ventricular response [facility])   Physical deconditioning   Discharge Condition: Stable and improved.  Discharged home with instruction to follow-up with PCP Days.  Also with outpatient follow-up with cardiology service (office will contact patient with appointment detail).  CODE STATUS: Full code  Diet recommendation: Heart healthy diet  Filed Weights   09/28/19 1118  Weight: 54.4 kg    History of present illness:  [name] a86 y.o.female,with past medical history relevant for chronic atrial fibrillation, HTN,,HLD, rheumatoid arthritis, CVA, L1/L2 fracture status post kyphoplasty, chronic lower back pain. -Patient presents to ED secondary to worsening lower back pain, and some lightheadedness, upon further questioning she does report she had syncope last Friday, reports when she stood up walked out of the room, she felt lightheaded and she lost consciousness and collapsed,, reports this was accompanied by some nausea, she denies any headache, or head trauma, she denies any chest pain, report she is at baseline with exertional dyspnea which has not worsened, but she did report some worsening lower back pain which prompted her to come to ED, she denies any new focal tingling, weakness, deficits, no urinary symptoms, no fever  or chills, reports she has been compliant with her medications. - in [place] was noted to be in A. fib with RVR, heart rate in the 140s, she was started on Cardizem drip, heart rate has improved, but this did bring her blood pressure down, her work-up was significant for stable renal function, stable hemoglobin, given she is on Cardizem drip in A. fib with RVR [facility] hospitalist consulted to admit patient for further evaluation and management.  Hospital Course:  1-atrial fibrillation with RVR -partially controlled initially with use of cardizem drip; BP became soft and drip stopped with resumption of home medication (metoprolol); since them, on 09/29/19 morning HR was up and uncontrolled again; she continued to be on A. Fib with RVR. -case discussed with cardiology service and patient ended receiving electrical cardioversion with successful return to sinus rhythm. -Patient has remained stable, and within sinus rhythm for 24 hours after cardioversion. -Discharge home on Eliquis twice a day and resumption of daily metoprolol extended release 75 mg by mouth daily. -Outpatient follow-up with cardiology service will be arranged  2-chronic back pain -continue chronic analgesics regimen.  3-physical deconditioning and weakness -PT recommending SNF -patient declined skilled nursing facility placement and has been discharged home with family care and home health PT.   4-GERD/nausea -will continue PPI  -Lifestyle changes discussed with patient.  5-HLD -continue statins   6-HTN -soft BP earlier on admission while receiving Cardizem drip. -Currently stable and patient asymptomatic from lightheadedness and dizziness sensation and standpoint. -Continue home metoprolol dose at discharge. -Patient advised to maintain adequate hydration and to watch the amount of sodium in her diet.    7-Chronic anticoagulation/Eliquis for Afib -No signs of overt bleeding -Continue to follow CBC intermittently to  assess  hemoglobin and stability.   Procedures:  See below for x-ray reports  Electrical cardioversion 09/29/19  Consultations:  Cardiology service.  Discharge Exam: Vitals:   09/29/19 1822 09/30/19 0654  BP: (!) 142/76 (!) 145/73  Pulse: 81 91  Resp: 18 18  Temp: 97.7 F (36.5 C) 98 F (36.7 C)  SpO2: 96% 93%    General: Afebrile, no chest pain, no nausea, no vomiting, no palpitations, feeling ready to go back home and expressed feeling on top of the world.  Chronically ill in appearance. Cardiovascular: Rate controlled, sinus rhythm, no rubs, no gallops, no JVD. Respiratory: Good air movement bilaterally, no wheezing, no crackles, good oxygen saturation on room air; normal respiratory effort. Abdomen: Soft, nontender, nondistended, positive bowel sounds. Extremities: No cyanosis, no clubbing, no edema.  Discharge Instructions   Discharge Instructions    Diet - low sodium heart healthy   Complete by: As directed    Discharge instructions   Complete by: As directed    Follow heart healthy diet Take medications as prescribed Maintain adequate hydration Arrange follow-up with PCP in 10 days Follow-up with cardiology service as instructed (office will contact you with appointment details) Remember the use of pulse oximeter (looking for a oxygen saturation above 90%; heart rate in between 60 and 110).   Increase activity slowly   Complete by: As directed      Allergies as of 09/30/2019      Reactions   Iodinated Diagnostic Agents Anaphylaxis   08-12-10 Pt had severe reaction with itching/hives/loss of consciousness within 5 minutes of CEPI today.  She received both Kenalog and Iondinated Contrast, so not certain which agent caused reaction.  If patient returns for another CEPI, Dr. Jobe Igo considers using Decadron and a 13-hour prep.  jkl   Nerve Block Tray Anaphylaxis   Patient states that she was sent to have Epidural for her shoulder. After rec'ving she remembers  saying that she was itching all over  And that's it. She was transported to hospital  By EMS. Stayed overnight.   Nitrofuran Derivatives Hives, Nausea And Vomiting   Triamcinolone Acetonide Anaphylaxis   08-12-10  Pt had severe reaction with itching/hives/loss of consciousness within five minutes of CEPI today.  She received both Kenalog and Iodinated Contrast, so not certain which agent caused reaction.  If pt returns for another cervical epidural steroid injection, Dr. Jobe Igo suggests using Decadron and a 13-hr prep.  JKL PATIENT USES PROCTOCREAM 2.5% WITHOUT DIFFICULTY FOR VULVAR DYSTROPHY Patient says she can take prednisone without any problem   Vibra-tab [doxycycline] Nausea And Vomiting      Medication List    TAKE these medications   acetaminophen 325 MG tablet Commonly known as: TYLENOL Take 2 tablets (650 mg total) by mouth every 6 (six) hours as needed for mild pain or headache (or Fever >/= 101).   apixaban 2.5 MG Tabs tablet Commonly known as: ELIQUIS Take 1 tablet (2.5 mg total) by mouth 2 (two) times daily.   aspirin 81 MG chewable tablet Chew 1 tablet (81 mg total) by mouth daily. What changed: when to take this   cholecalciferol 25 MCG (1000 UNIT) tablet Commonly known as: VITAMIN D3 Take 2,000 Units by mouth 2 (two) times daily.   HYDROcodone-acetaminophen 7.5-325 MG tablet Commonly known as: NORCO Take 1 tablet by mouth 3 (three) times daily as needed for moderate pain.   Lastacaft 0.25 % Soln Generic drug: Alcaftadine Apply 1 drop to eye 2 (two) times daily.   leflunomide  20 MG tablet Commonly known as: ARAVA Take 1 tablet (20 mg total) by mouth daily.   meclizine 12.5 MG tablet Commonly known as: ANTIVERT Take 1 tablet (12.5 mg total) by mouth 2 (two) times daily as needed for dizziness.   metoprolol succinate 25 MG 24 hr tablet Commonly known as: TOPROL-XL Take 3 tablets (75 mg total) by mouth daily.   omeprazole 20 MG capsule Commonly known as:  PRILOSEC TAKE 1 CAPSULE BY MOUTH TWICE DAILY   rosuvastatin 20 MG tablet Commonly known as: CRESTOR Take 1 tablet (20 mg total) by mouth daily at 6 PM.      Allergies  Allergen Reactions  . Iodinated Diagnostic Agents Anaphylaxis    08-12-10 Pt had severe reaction with itching/hives/loss of consciousness within 5 minutes of CEPI today.  She received both Kenalog and Iondinated Contrast, so not certain which agent caused reaction.  If patient returns for another CEPI, Dr. Jobe Igo considers using Decadron and a 13-hour prep.  jkl  . Nerve Block Tray Anaphylaxis    Patient states that she was sent to have Epidural for her shoulder. After rec'ving she remembers saying that she was itching all over  And that's it. She was transported to hospital  By EMS. Stayed overnight.  [place] Spikes Derivatives Hives and Nausea And Vomiting  . Triamcinolone Acetonide Anaphylaxis    08-12-10  Pt had severe reaction with itching/hives/loss of consciousness within five minutes of CEPI today.  She received both Kenalog and Iodinated Contrast, so not certain which agent caused reaction.  If pt returns for another cervical epidural steroid injection, Dr. Jobe Igo suggests using Decadron and a 13-hr prep.  JKL PATIENT USES PROCTOCREAM 2.5% WITHOUT DIFFICULTY FOR VULVAR DYSTROPHY Patient says she can take prednisone without any problem  . Vibra-Tab [Doxycycline] Nausea And Vomiting    Follow-up Information    [name], MD. Schedule an appointment as soon as possible for a visit in 10 day(s).   Specialty: Internal Medicine Contact information: [address] [place] [address] [phone]        Herminio Commons, MD .   Specialty: Cardiology Contact information: [place] [id number] [phone]               The results of significant diagnostics from this hospitalization (including imaging, microbiology, ancillary and laboratory) are listed below for  reference.    Significant Diagnostic Studies: DG Chest 2 View  Result Date: 09/01/2019 CLINICAL DATA:  Interstitial lung disease EXAM: CHEST - 2 VIEW COMPARISON:  Multiple priors, including CT 05/04/2019, chest radiograph 02/17/2019 FINDINGS: Similar appearance of diffuse reticular opacities interstitial thickening. No pleural effusions. No discernible pneumothorax. Similar chronic elevation of the right hemidiaphragm. Stable cardiopericardial silhouette, within normal limits. Aortic atherosclerosis. Redemonstrated changes of vertebral augmentation in the upper lumbar spine without progressive vertebral body height loss. Partially imaged cervical ACDF. Bilateral glenohumeral degenerative change. IMPRESSION: Similar findings of interstitial lung disease, which was better characterized on prior CT chest. No evidence of acute abnormality. Electronically Signed   By: Margaretha Sheffield MD   On: 09/01/2019 15:33   DG Lumbar Spine Complete  Result Date: 09/28/2019 CLINICAL DATA:  Status post fall presenting with lower back pain and right leg pain. EXAM: LUMBAR SPINE - COMPLETE 4+ VIEW; SACRUM AND COCCYX - 2+ VIEW COMPARISON:  X-ray lumbar spine 04/20/2017. x-ray fluoro guided kyphoplasty 04/28/2017 report WITHOUT images FINDINGS: Five non-rib-bearing lumbar vertebral bodies. Partial visualization of the sacrum due to overlying bowel.  Slight dextrocurvature of the thoracolumbar spine is again noted. Alignment is otherwise normal. Worsened anterior L1 vertebral body compression fracture demonstrating at least 60% (from 20%) height loss with interval kyphoplasty. Stable mild compression fracture of the L2 vertebral body with kyphoplasty at this level again noted. There is no evidence of acute displaced sacral spine fracture with the visualized sacral arcuate lines appearing intact. Multilevel mild intervertebral disc space narrowing. Multilevel degenerative changes of the spine with osteophyte formation and moderate  severe facet arthropathy. Surgical clips and bowel anastomotic sutures overlie the pelvis. Severe atherosclerotic plaque of the aorta. IMPRESSION: 1. Age-indeterminate worsening of an anterior L1 vertebral body compression fracture now demonstrating at least 60% height loss anteriorly with interval kyphoplasty at this level. (Unclear if the worsened compression fracture happened prior or following the new kyphoplasty at this level). 2. Stable mild compression fracture of L2 vertebral body status post kyphoplasty. 3. No acute displaced fracture of the sacral spine. 4. No traumatic listhesis of the lumbosacral spine. Electronically Signed   By: Iven Finn M.D.   On: 09/28/2019 17:43   DG Sacrum/Coccyx  Result Date: 09/28/2019 CLINICAL DATA:  Status post fall presenting with lower back pain and right leg pain. EXAM: LUMBAR SPINE - COMPLETE 4+ VIEW; SACRUM AND COCCYX - 2+ VIEW COMPARISON:  X-ray lumbar spine 04/20/2017. x-ray fluoro guided kyphoplasty 04/28/2017 report WITHOUT images FINDINGS: Five non-rib-bearing lumbar vertebral bodies. Partial visualization of the sacrum due to overlying bowel. Slight dextrocurvature of the thoracolumbar spine is again noted. Alignment is otherwise normal. Worsened anterior L1 vertebral body compression fracture demonstrating at least 60% (from 20%) height loss with interval kyphoplasty. Stable mild compression fracture of the L2 vertebral body with kyphoplasty at this level again noted. There is no evidence of acute displaced sacral spine fracture with the visualized sacral arcuate lines appearing intact. Multilevel mild intervertebral disc space narrowing. Multilevel degenerative changes of the spine with osteophyte formation and moderate severe facet arthropathy. Surgical clips and bowel anastomotic sutures overlie the pelvis. Severe atherosclerotic plaque of the aorta. IMPRESSION: 1. Age-indeterminate worsening of an anterior L1 vertebral body compression fracture now  demonstrating at least 60% height loss anteriorly with interval kyphoplasty at this level. (Unclear if the worsened compression fracture happened prior or following the new kyphoplasty at this level). 2. Stable mild compression fracture of L2 vertebral body status post kyphoplasty. 3. No acute displaced fracture of the sacral spine. 4. No traumatic listhesis of the lumbosacral spine. Electronically Signed   By: Iven Finn M.D.   On: 09/28/2019 17:43   CT Head Wo Contrast  Result Date: 09/28/2019 CLINICAL DATA:  Syncope, simple, normal neuro exam. Additional history provided: Patient reports dizziness, syncope on Friday night. EXAM: CT HEAD WITHOUT CONTRAST TECHNIQUE: Contiguous axial images were obtained from the base of the skull through the vertex without intravenous contrast. COMPARISON:  MRI/MRA head 08/25/2018, head CT 08/25/2018. FINDINGS: Brain Stable, moderate generalized parenchymal atrophy. Known chronic lacunar infarcts within the cerebral white matter, basal ganglia, thalami and cerebellum some of which were better appreciated on the prior MRI of 08/25/2018. Stable background mild ill-defined hypoattenuation within the cerebral white matter and pons which is nonspecific, but consistent with chronic small vessel ischemic disease. There is no acute intracranial hemorrhage. No demarcated cortical infarct. No extra-axial fluid collection. No evidence of intracranial mass. No midline shift. Vascular: No hyperdense vessel.  Atherosclerotic calcifications. Skull: Normal. Negative for fracture or focal lesion. Sinuses/Orbits: Visualized orbits show no acute finding. Frothy secretions, mild mucosal  thickening and small air-fluid level within the left maxillary sinus. Frothy secretions and moderate mucosal thickening within the right sphenoid sinus. Mild partial opacification of ethmoid air cells. No significant mastoid effusion. IMPRESSION: No evidence of acute intracranial abnormality. Known chronic  lacunar infarcts within the cerebral white matter, basal ganglia, thalami and cerebellum. Stable background mild chronic small vessel ischemic disease within the cerebral white matter and pons. Stable moderate generalized parenchymal atrophy. Ethmoid, right sphenoid and left maxillary sinusitis. Electronically Signed   By: Kellie Simmering DO   On: 09/28/2019 16:52   DG Chest Port 1 View  Result Date: 09/28/2019 CLINICAL DATA:  Atrial arrhythmia, syncopal episode Friday night EXAM: PORTABLE CHEST 1 VIEW COMPARISON:  Radiograph 08/31/2019, CT 05/04/2019 FINDINGS: Chronically coarsened interstitial and bronchitic features are similar to comparison radiography and suggested to be on the spectrum of UIP on prior high-resolution CT examination. No consolidation, features of edema, pneumothorax, or effusion. The aorta is calcified. The remaining cardiomediastinal contours are unremarkable. No acute osseous or soft tissue abnormality. Degenerative changes are present in the imaged spine and shoulders. Telemetry leads overlie the chest. IMPRESSION: No acute cardiopulmonary abnormality. Chronically coarsened interstitial and bronchitic features are similar to comparison radiography and previously suggested to be on the spectrum of UIP on prior HRCT. Aortic Atherosclerosis (ICD10-I70.0). Electronically Signed   By: Lovena Le M.D.   On: 09/28/2019 16:59   ECHOCARDIOGRAM COMPLETE  Result Date: 09/29/2019    ECHOCARDIOGRAM REPORT   Patient Name:   LILLIA LENGEL Date of Exam: 09/29/2019 Medical Rec #:  [id number]         Height:       62.0 in Accession #:    [id number]        Weight:       120.0 lb Date of Birth:  19-Nov-1933          BSA:          1.539 m Patient Age:    30 years          BP:           103/75 mmHg Patient Gender: F                 HR:           78 bpm. Exam Location:  [facility] Procedure: 2D Echo, Cardiac Doppler and Color Doppler Indications:    Syncope 780.2 / R55  History:        Patient has prior  history of Echocardiogram examinations, most                 recent 12/16/2017. TIA, Arrythmias:Atrial Fibrillation; Risk                 Factors:Dyslipidemia and Hypertension. Chronic                 anticoagulation/Eliquis for Afib.  Sonographer:    Alvino Chapel RCS Referring Phys: [place]  1. Left ventricular ejection fraction, by estimation, is 70 to 75%. The left ventricle has normal function. The left ventricle has no regional wall motion abnormalities. Left ventricular diastolic parameters are indeterminate.  2. Right ventricular systolic function is normal. The right ventricular size is normal.  3. Left atrial size was mildly dilated.  4. The mitral valve is normal in structure. Trivial mitral valve regurgitation. No evidence of mitral stenosis.  5. The aortic valve is tricuspid. Aortic valve regurgitation is mild. No aortic stenosis is present.  6. The inferior vena cava is normal in size with greater than 50% respiratory variability, suggesting right atrial pressure of 3 mmHg. FINDINGS  Left Ventricle: Left ventricular ejection fraction, by estimation, is 70 to 75%. The left ventricle has normal function. The left ventricle has no regional wall motion abnormalities. The left ventricular internal cavity size was normal in size. There is  no left ventricular hypertrophy. Left ventricular diastolic parameters are indeterminate. Right Ventricle: The right ventricular size is normal. No increase in right ventricular wall thickness. Right ventricular systolic function is normal. Left Atrium: Left atrial size was mildly dilated. Right Atrium: Right atrial size was normal in size. Pericardium: There is no evidence of pericardial effusion. Mitral Valve: The mitral valve is normal in structure. There is mild thickening of the mitral valve leaflet(s). There is mild calcification of the mitral valve leaflet(s). Mild mitral annular calcification. Trivial mitral valve regurgitation. No  evidence  of mitral valve stenosis. Tricuspid Valve: The tricuspid valve is normal in structure. Tricuspid valve regurgitation is not demonstrated. No evidence of tricuspid stenosis. Aortic Valve: The aortic valve is tricuspid. Aortic valve regurgitation is mild. No aortic stenosis is present. Aortic valve mean gradient measures 3.3 mmHg. Aortic valve peak gradient measures 6.6 mmHg. Aortic valve area, by VTI measures 2.27 cm. Pulmonic Valve: The pulmonic valve was not well visualized. Pulmonic valve regurgitation is not visualized. No evidence of pulmonic stenosis. Aorta: The aortic root is normal in size and structure. Pulmonary Artery: Indeterminant PASP, inadequate TR jet. Venous: The inferior vena cava is normal in size with greater than 50% respiratory variability, suggesting right atrial pressure of 3 mmHg. IAS/Shunts: No atrial level shunt detected by color flow Doppler.  LEFT VENTRICLE PLAX 2D LVIDd:         3.71 cm LVIDs:         2.08 cm LV PW:         1.03 cm LV IVS:        1.00 cm LVOT diam:     1.90 cm LV SV:         47 LV SV Index:   31 LVOT Area:     2.84 cm  RIGHT VENTRICLE RV S prime:     11.00 cm/s LEFT ATRIUM             Index       RIGHT ATRIUM          Index LA diam:        3.50 cm 2.27 cm/m  RA Area:     7.54 cm LA Vol (A2C):   64.5 ml 41.92 ml/m RA Volume:   12.90 ml 8.38 ml/m LA Vol (A4C):   44.4 ml 28.86 ml/m LA Biplane Vol: 54.5 ml 35.42 ml/m  AORTIC VALVE AV Area (Vmax):    2.37 cm AV Area (Vmean):   2.53 cm AV Area (VTI):     2.27 cm AV Vmax:           128.14 cm/s AV Vmean:          82.776 cm/s AV VTI:            0.207 m AV Peak Grad:      6.6 mmHg AV Mean Grad:      3.3 mmHg LVOT Vmax:         107.00 cm/s LVOT Vmean:        73.933 cm/s LVOT VTI:          0.166 m LVOT/AV  VTI ratio: 0.80  AORTA Ao Root diam: 3.20 cm MITRAL VALVE MV Area (PHT): 5.84 cm     SHUNTS MV Decel Time: 130 msec     Systemic VTI:  0.17 m MV E velocity: 107.00 cm/s  Systemic Diam: 1.90 cm Carlyle Dolly  MD Electronically signed by Carlyle Dolly MD Signature Date/Time: 09/29/2019/11:20:16 AM    Final     Microbiology: Recent Results (from the past 240 hour(s))  Urine Culture     Status: Abnormal   Collection Time: 09/28/19 11:20 AM   Specimen: Urine, Random  Result Value Ref Range Status   Specimen Description   Final    URINE, RANDOM Performed at [facility], [address], [place], [place] [address]    Special Requests   Final    NONE Performed at [facility], [address] [place], [place] [address]    Culture (A)  Final    <10,000 COLONIES/mL INSIGNIFICANT GROWTH Performed at [facility], [place] [address] [place], [place] [address]    Report Status 09/29/2019 FINAL  Final  SARS Coronavirus 2 by RT PCR (hospital order, performed in [facility] hospital lab) Nasopharyngeal Nasopharyngeal Swab     Status: None   Collection Time: 09/28/19  6:33 PM   Specimen: Nasopharyngeal Swab  Result Value Ref Range Status   SARS Coronavirus 2 NEGATIVE NEGATIVE Final    Comment: (NOTE) SARS-CoV-2 target nucleic acids are NOT DETECTED.  The SARS-CoV-2 RNA is generally detectable in upper and lower respiratory specimens during the acute phase of infection. The lowest concentration of SARS-CoV-2 viral copies this assay can detect is 250 copies / mL. A negative result does not preclude SARS-CoV-2 infection and should not be used as the sole basis for treatment or other patient management decisions.  A negative result may occur with improper specimen collection / handling, submission of specimen other than nasopharyngeal swab, presence of viral mutation(s) within the areas targeted by this assay, and inadequate number of viral copies (<250 copies / mL). A negative result must be combined with clinical observations, patient history, and epidemiological information.  Fact Sheet for Patients:   StrictlyIdeas.no  Fact Sheet for Healthcare  Providers: BankingDealers.co.za  This test is not yet approved or  cleared by the [place] FDA and has been authorized for detection and/or diagnosis of SARS-CoV-2 by FDA under an Emergency Use Authorization (EUA).  This EUA will remain in effect (meaning this test can be used) for the duration of the COVID-19 declaration under Section 564(b)(1) of the Act, 21 U.S.C. section 360bbb-3(b)(1), unless the authorization is terminated or revoked sooner.  Performed at [facility], [facility], [address] [place],  [address]      Labs: Basic Metabolic Panel: Recent Labs  Lab 09/28/19 1242 09/29/19 0252  NA 141 139  K 3.6 3.7  CL 101 105  CO2 26 27  GLUCOSE 90 100*  BUN 15 15  CREATININE 0.75 0.63  CALCIUM 8.4* 8.0*   CBC: Recent Labs  Lab 09/28/19 1242 09/29/19 0252  WBC 4.6 3.9*  HGB 13.0 12.4  HCT 42.0 38.5  MCV 97.0 94.4  PLT [phone]* 132*    Signed:  Barton Dubois MD.  [facility] 09/30/2019, 3:26 PM

## 2019-10-02 ENCOUNTER — Encounter (HOSPITAL_COMMUNITY): Payer: Self-pay | Admitting: Cardiology

## 2019-10-03 ENCOUNTER — Other Ambulatory Visit: Payer: Self-pay | Admitting: *Deleted

## 2019-10-03 NOTE — Patient Outreach (Addendum)
[facility]) Care Management  10/03/2019  [name] Sep 08, 1933 [id number]     EMMI-general discharge d/c from [place] Day #   1       Date: Monday 10/02/19 1000 Red Alert Reason: scheduled follow-up? No   Insurance: united healthcare medicare   [facility] admissions x 1 ED visits x 1  in the last 6 months    Outreach attempt # 1 Patient is able to verify HIPAA identifiers [facility] Care Management RN reviewed and addressed red alert with patient   EMMI:  Subjective I did schedule the appointments  I have an appointment on tomorrow with [name] at my doctor office and I called the heart doctor to make sure my appointment with them is on the telephone today I am in a wheelchair You can close it referring to [facility] services I am okay and always seeing a doctor  Appointments Dr Rowan Blase 10/04/19 [place] [facility] 10/06/19    Consent: [facility] RN CM reviewed [facility] services with patient. Patient gave verbal consent for services [facility] telephonic RN CM.   Advised patient that there will be further automated EMMI- post discharge calls to assess how the patient is doing following the recent hospitalization Advised the patient that another call may be received from a nurse if any of their responses were abnormal. Patient voiced understanding and was appreciative of f/u call.   Plan: [facility] RN CM will close this case as the patient prefers and denies any further needs at this time  [place] sent a successful outreach letter as discussed with [facility] brochure enclosed for review   Corn. Lavina Hamman, RN, BSN, [place] Coordinator Office number [phone] Mobile number [phone]  Main [facility] number [phone] Fax number [phone]

## 2019-10-04 DIAGNOSIS — Z6822 Body mass index (BMI) 22.0-22.9, adult: Secondary | ICD-10-CM | POA: Diagnosis not present

## 2019-10-04 DIAGNOSIS — I4891 Unspecified atrial fibrillation: Secondary | ICD-10-CM | POA: Diagnosis not present

## 2019-10-04 DIAGNOSIS — M1991 Primary osteoarthritis, unspecified site: Secondary | ICD-10-CM | POA: Diagnosis not present

## 2019-10-04 DIAGNOSIS — M810 Age-related osteoporosis without current pathological fracture: Secondary | ICD-10-CM | POA: Diagnosis not present

## 2019-10-04 DIAGNOSIS — I10 Essential (primary) hypertension: Secondary | ICD-10-CM | POA: Diagnosis not present

## 2019-10-05 ENCOUNTER — Ambulatory Visit: Payer: Medicare Other | Admitting: Internal Medicine

## 2019-10-06 ENCOUNTER — Other Ambulatory Visit: Payer: Self-pay

## 2019-10-06 ENCOUNTER — Telehealth (INDEPENDENT_AMBULATORY_CARE_PROVIDER_SITE_OTHER): Payer: Medicare Other | Admitting: Student

## 2019-10-06 ENCOUNTER — Encounter: Payer: Self-pay | Admitting: Student

## 2019-10-06 VITALS — HR 86 | Wt 120.0 lb

## 2019-10-06 DIAGNOSIS — Z8673 Personal history of transient ischemic attack (TIA), and cerebral infarction without residual deficits: Secondary | ICD-10-CM

## 2019-10-06 DIAGNOSIS — J849 Interstitial pulmonary disease, unspecified: Secondary | ICD-10-CM

## 2019-10-06 DIAGNOSIS — I480 Paroxysmal atrial fibrillation: Secondary | ICD-10-CM | POA: Diagnosis not present

## 2019-10-06 DIAGNOSIS — E785 Hyperlipidemia, unspecified: Secondary | ICD-10-CM | POA: Diagnosis not present

## 2019-10-06 NOTE — Patient Instructions (Signed)
Medication Instructions:  Continue all current medications.  Labwork: none  Testing/Procedures: none  Follow-Up: 3-4 months   Any Other Special Instructions Will Be Listed Below (If Applicable).  If you need a refill on your cardiac medications before your next appointment, please call your pharmacy.

## 2019-10-06 NOTE — Progress Notes (Signed)
Virtual Visit via Telephone Note   This visit type was conducted due to national recommendations for restrictions regarding the COVID-19 Pandemic (e.g. social distancing) in an effort to limit this patient's exposure and mitigate transmission in our community.  Due to her co-morbid illnesses, this patient is at least at moderate risk for complications without adequate follow up.  This format is felt to be most appropriate for this patient at this time.  The patient did not have access to video technology/had technical difficulties with video requiring transitioning to audio format only (telephone).  All issues noted in this document were discussed and addressed.  No physical exam could be performed with this format.  Please refer to the patient's chart for her  consent to telehealth for [facility].   Date:  10/06/2019   ID:  [name], DOB 1933-10-09, MRN [id number] The patient was identified using 2 identifiers.  Patient Location: Home Provider Location: Office/Clinic  PCP:  [name], MD  Cardiologist:  Kate Sable, MD (Inactive) --> Will switch to Dr. Harl Bowie Electrophysiologist:  None   Evaluation Performed:  Follow-Up Visit  Chief Complaint:  Hospital Follow-up  History of Present Illness:    [name] is a 86 y.o. female with past medical history of paroxysmal atrial fibrillation, interstitial lung disease, rheumatoid arthritis and prior CVA who presents for a hospital follow-up telehealth visit.  She most recently presented to [facility] ED on 09/28/2019 for evaluation of worsening weakness and dizziness. She did report having a syncopal episode the Friday prior to admission. While in the ED, she was found to be in atrial fibrillation with RVR and heart rate was in the 140's. She was initially started on IV Cardizem but this was discontinued given issues with hypotension.  Amiodarone was not initiated given interstitial lung disease. Repeat echocardiogram  showed a preserved EF of 70 to 75% with no regional wall motion abnormalities. She did have trivial MR and mild AI. A cardioversion was recommended and was performed later that day by Dr. Harl Bowie. She converted back to normal sinus rhythm with a single synchronized shock at 200 J. She was monitored overnight and discharged home the following morning.  In talking with the patient today, she reports overall doing well since returning home. She is mostly wheelchair-bound at baseline but reports her weakness has improved. She denies any specific palpitations or dyspnea on exertion. No recent chest pain, orthopnea, PND or lower extremity edema. She says she was overall unaware of her arrhythmia prior to admission and came in because she had experienced a syncopal episode and her back was hurting.  She reports good compliance with her current medication regimen and denies missing any recent doses. No recent melena, hematochezia or hematuria. She has remained on Eliquis and ASA per Neurology from her report.  The patient does not have symptoms concerning for COVID-19 infection (fever, chills, cough, or new shortness of breath).    Past Medical History:  Diagnosis Date   A-fib [facility])    Atrial fibrillation ([place])    Cervicalgia    Colon cancer ([place])    Degeneration of cervical intervertebral disc    Hypertension    Pinched nerve in shoulder    Rheumatoid arthritis(714.0)    RUQ pain    Vulvar dystrophy    Yeast infection    Past Surgical History:  Procedure Laterality Date   ABDOMINAL HYSTERECTOMY  1960'S   APPENDECTOMY  with hysterectomy   BACK SURGERY  12/08/10  plate/screws C3 C4   CARDIOVERSION N/A 09/29/2019   Procedure: CARDIOVERSION;  Surgeon: Cassandra Lenis, MD;  [facility];  Service: Endoscopy;  Laterality: N/A;  pt ate early this AM per Dr. Harl Bowie   CHOLECYSTECTOMY  2002   COLONOSCOPY  04/02/2009   COLONOSCOPY  06/04/05   COLONOSCOPY N/A 07/26/2015     Procedure: COLONOSCOPY;  Surgeon: Cassandra Houston, MD;  Location: [facility] ENDO SUITE;  Service: Endoscopy;  Laterality: N/A;  1240   COLONOSCOPY WITH ESOPHAGOGASTRODUODENOSCOPY (EGD) N/A 04/08/2012   Procedure: COLONOSCOPY WITH ESOPHAGOGASTRODUODENOSCOPY (EGD);  Surgeon: Cassandra Houston, MD;  Location: [facility] ENDO SU[facility];  Service: Endoscopy;  Laterality: N/A;  130-moved to 245 Ann notified pt   ESOPHAGOGASTRODUODENOSCOPY N/A 07/26/2015   Procedure: ESOPHAGOGASTRODUODENOSCOPY (EGD);  Surgeon: Cassandra Houston, MD;  Location: [facility];  Service: Endoscopy;  Laterality: N/A;   HEMICOLECTOMY  10/05     Current Meds  Medication Sig   acetaminophen (TYLENOL) 325 MG tablet Take 2 tablets (650 mg total) by mouth every 6 (six) hours as needed for mild pain or headache (or Fever >/= 101).   apixaban (ELIQUIS) 2.5 MG TABS tablet Take 1 tablet (2.5 mg total) by mouth 2 (two) times daily.   aspirin 81 MG chewable tablet Chew 1 tablet (81 mg total) by mouth daily. (Patient taking differently: Chew 81 mg by mouth at bedtime. )   cholecalciferol (VITAMIN D3) 25 MCG (1000 UT) tablet Take 2,000 Units by mouth 2 (two) times daily.    HYDROcodone-acetaminophen (NORCO) 7.5-325 MG tablet Take 1 tablet by mouth 3 (three) times daily as needed for moderate pain.   LASTACAFT 0.25 % SOLN Apply 1 drop to eye 2 (two) times daily.   leflunomide (ARAVA) 20 MG tablet Take 1 tablet (20 mg total) by mouth daily.   metoprolol succinate (TOPROL-XL) 25 MG 24 hr tablet Take 3 tablets (75 mg total) by mouth daily.   omeprazole (PRILOSEC) 20 MG capsule TAKE 1 CAPSULE BY MOUTH TWICE DAILY   rosuvastatin (CRESTOR) 20 MG tablet Take 1 tablet (20 mg total) by mouth daily at 6 PM.     Allergies:   Iodinated diagnostic agents, Nerve block tray, Nitrofuran derivatives, Triamcinolone acetonide, and Vibra-tab [doxycycline]   Social History   Tobacco Use   Smoking status: Never Smoker   Smokeless tobacco: Never Used  Vaping  Use   Vaping Use: Never used  Substance Use Topics   Alcohol use: No   Drug use: No     Family Hx: The patient's family history includes Healthy in her brother, daughter, son, and son; Hiatal hernia in her sister; Parkinsonism in her father; Rheum arthritis in her mother.  ROS:   Please see the history of present illness.     All other systems reviewed and are negative.   Prior CV studies:   The following studies were reviewed today:  Echocardiogram: 09/2019 IMPRESSIONS    1. Left ventricular ejection fraction, by estimation, is 70 to 75%. The  left ventricle has normal function. The left ventricle has no regional  wall motion abnormalities. Left ventricular diastolic parameters are  indeterminate.  2. Right ventricular systolic function is normal. The right ventricular  size is normal.  3. Left atrial size was mildly dilated.  4. The mitral valve is normal in structure. Trivial mitral valve  regurgitation. No evidence of mitral stenosis.  5. The aortic valve is tricuspid. Aortic valve regurgitation is mild. No  aortic stenosis is present.  6.  The inferior vena cava is normal in size with greater than 50%  respiratory variability, suggesting right atrial pressure of 3 mmHg.    Labs/Other Tests and Data Reviewed:    EKG:  An ECG dated 09/29/2019 was personally reviewed today and demonstrated: NSR, HR 95 with ST abnormalities along the precordial leads most consistent with LVH and repol.   Recent Labs: 08/31/2019: ALT 9 09/28/2019: TSH 1.075 09/29/2019: BUN 15; Creatinine, Ser 0.63; Hemoglobin 12.4; Platelets 132; Potassium 3.7; Sodium 139   Recent Lipid Panel Lab Results  Component Value Date/Time   CHOL 248 (H) 08/26/2018 05:45 AM   TRIG 608 (H) 08/26/2018 05:45 AM   HDL 33 (L) 08/26/2018 05:45 AM   CHOLHDL 7.5 08/26/2018 05:45 AM   LDLCALC UNABLE TO CALCULATE IF TRIGLYCERIDE OVER 400 mg/dL 08/26/2018 05:45 AM   LDLDIRECT 177.9 (H) 08/26/2018 05:45 AM     Wt Readings from Last 3 Encounters:  10/06/19 120 lb (54.4 kg)  09/28/19 120 lb (54.4 kg)  08/31/19 123 lb 3.2 oz (55.9 kg)     Objective:    Vital Signs:  Pulse 86    Wt 120 lb (54.4 kg)    SpO2 93%    BMI 21.95 kg/m    General: Pleasant female sounding in NAD Psych: Normal affect. Neuro: Alert and oriented X 3.  Lungs:  Resp regular and unlabored while talking on the phone.   ASSESSMENT & PLAN:    1. Paroxysmal Atrial Fibrillation - Recently diagnosed with recurrence last week and underwent DCCV by Dr. Harl Bowie on 09/29/2019 with return to NSR. Denies any palpitations and reports her weakness continues to improve. HR has been in the 70's to 80's when checked at home. Continue Toprol-XL 75mg  daily for rate-control.  - She denies any evidence of active bleeding. CBC on 09/29/2019 showed Hgb at 12.4 and platelets 132. Remains on Eliquis 2.5mg  BID (reduced dosing given age and weight). Has remained on ASA as well per Neurology.   2. Interstitial Lung Diease - Followed by [facility]. Reports oxygen saturations have been at > 90% when checked at home.   3. HLD - Followed by PCP. She remains on Crestor 20mg  daily with goal LDL less than 70 due to prior CVA.   4. History of CVA - Remains on ASA 81 mg daily and Crestor 20 mg daily.    COVID-19 Education: The signs and symptoms of COVID-19 were discussed with the patient and how to seek care for testing (follow up with PCP or arrange E-visit). The importance of social distancing was discussed today.  Time:   Today, I have spent 22 minutes with the patient with telehealth technology discussing the above problems.     Medication Adjustments/Labs and Tests Ordered: Current medicines are reviewed at length with the patient today.  Concerns regarding medicines are outlined above.   Tests Ordered: No orders of the defined types were placed in this encounter.   Medication Changes: No orders of the defined types were placed  in this encounter.   Follow Up:  In Person in 3-4 month(s)  Signed, Erma Heritage, PA-C  10/06/2019 4:42 PM    [facility]

## 2019-10-12 DIAGNOSIS — I480 Paroxysmal atrial fibrillation: Secondary | ICD-10-CM | POA: Diagnosis not present

## 2019-10-12 DIAGNOSIS — E7849 Other hyperlipidemia: Secondary | ICD-10-CM | POA: Diagnosis not present

## 2019-10-12 LAB — PULMONARY FUNCTION TEST
DL/VA % pred: 102 %
DL/VA: 4.19 ml/min/mmHg/L
DLCO cor % pred: 86 %
DLCO cor: 14.94 ml/min/mmHg
DLCO unc % pred: 83 %
DLCO unc: 14.41 ml/min/mmHg
FEF 25-75 Pre: 1.71 L/sec
FEF2575-%Pred-Pre: 156 %
FEV1-%Pred-Pre: 88 %
FEV1-Pre: 1.42 L
FEV1FVC-%Pred-Pre: 117 %
FEV6-%Pred-Pre: 80 %
FEV6-Pre: 1.65 L
FEV6FVC-%Pred-Pre: 106 %
FVC-%Pred-Pre: 75 %
Pre FEV1/FVC ratio: 86 %
Pre FEV6/FVC Ratio: 100 %

## 2019-10-17 ENCOUNTER — Ambulatory Visit: Payer: Medicare Other | Admitting: Internal Medicine

## 2019-10-18 ENCOUNTER — Emergency Department (HOSPITAL_COMMUNITY): Payer: Medicare Other

## 2019-10-18 ENCOUNTER — Other Ambulatory Visit: Payer: Self-pay

## 2019-10-18 ENCOUNTER — Encounter (HOSPITAL_COMMUNITY): Payer: Self-pay

## 2019-10-18 ENCOUNTER — Emergency Department (HOSPITAL_COMMUNITY)
Admission: EM | Admit: 2019-10-18 | Discharge: 2019-10-18 | Disposition: A | Discharge status: Home / Self Care | Payer: Medicare Other | Attending: Emergency Medicine | Admitting: Emergency Medicine

## 2019-10-18 DIAGNOSIS — R6889 Other general symptoms and signs: Secondary | ICD-10-CM | POA: Diagnosis not present

## 2019-10-18 DIAGNOSIS — Z85038 Personal history of other malignant neoplasm of large intestine: Secondary | ICD-10-CM | POA: Diagnosis not present

## 2019-10-18 DIAGNOSIS — R404 Transient alteration of awareness: Secondary | ICD-10-CM | POA: Diagnosis not present

## 2019-10-18 DIAGNOSIS — Z79899 Other long term (current) drug therapy: Secondary | ICD-10-CM | POA: Diagnosis not present

## 2019-10-18 DIAGNOSIS — R42 Dizziness and giddiness: Secondary | ICD-10-CM | POA: Insufficient documentation

## 2019-10-18 DIAGNOSIS — Z743 Need for continuous supervision: Secondary | ICD-10-CM | POA: Diagnosis not present

## 2019-10-18 DIAGNOSIS — I10 Essential (primary) hypertension: Secondary | ICD-10-CM | POA: Diagnosis not present

## 2019-10-18 DIAGNOSIS — R0902 Hypoxemia: Secondary | ICD-10-CM | POA: Diagnosis not present

## 2019-10-18 DIAGNOSIS — Z7982 Long term (current) use of aspirin: Secondary | ICD-10-CM | POA: Insufficient documentation

## 2019-10-18 DIAGNOSIS — Z7901 Long term (current) use of anticoagulants: Secondary | ICD-10-CM | POA: Diagnosis not present

## 2019-10-18 LAB — CBC WITH DIFFERENTIAL/PLATELET
Abs Immature Granulocytes: 0.02 10*3/uL (ref 0.00–0.07)
Basophils Absolute: 0.1 10*3/uL (ref 0.0–0.1)
Basophils Relative: 1 %
Eosinophils Absolute: 0.4 10*3/uL (ref 0.0–0.5)
Eosinophils Relative: 9 %
HCT: 38.6 % (ref 36.0–46.0)
Hemoglobin: 12.4 g/dL (ref 12.0–15.0)
Immature Granulocytes: 0 %
Lymphocytes Relative: 20 %
Lymphs Abs: 0.9 10*3/uL (ref 0.7–4.0)
MCH: 30.5 pg (ref 26.0–34.0)
MCHC: 32.1 g/dL (ref 30.0–36.0)
MCV: 95.1 fL (ref 80.0–100.0)
Monocytes Absolute: 0.6 10*3/uL (ref 0.1–1.0)
Monocytes Relative: 13 %
Neutro Abs: 2.5 10*3/uL (ref 1.7–7.7)
Neutrophils Relative %: 57 %
Platelets: 149 10*3/uL — ABNORMAL LOW (ref 150–400)
RBC: 4.06 MIL/uL (ref 3.87–5.11)
RDW: 15.4 % (ref 11.5–15.5)
WBC: 4.5 10*3/uL (ref 4.0–10.5)
nRBC: 0 % (ref 0.0–0.2)

## 2019-10-18 LAB — BASIC METABOLIC PANEL
Anion gap: 8 (ref 5–15)
BUN: 15 mg/dL (ref 8–23)
CO2: 29 mmol/L (ref 22–32)
Calcium: 8.5 mg/dL — ABNORMAL LOW (ref 8.9–10.3)
Chloride: 103 mmol/L (ref 98–111)
Creatinine, Ser: 0.67 mg/dL (ref 0.44–1.00)
GFR calc non Af Amer: >60 mL/min (ref >60)
Glucose, Bld: 103 mg/dL — ABNORMAL HIGH (ref 70–99)
Potassium: 5.1 mmol/L (ref 3.5–5.1)
Sodium: 140 mmol/L (ref 135–145)

## 2019-10-18 LAB — URINALYSIS, ROUTINE W REFLEX MICROSCOPIC
Bilirubin Urine: NEGATIVE
Glucose, UA: NEGATIVE mg/dL
Hgb urine dipstick: NEGATIVE
Ketones, ur: NEGATIVE mg/dL
Leukocytes,Ua: NEGATIVE
Nitrite: NEGATIVE
Protein, ur: NEGATIVE mg/dL
Specific Gravity, Urine: 1.004 — ABNORMAL LOW (ref 1.005–1.030)
pH: 7 (ref 5.0–8.0)

## 2019-10-18 MED ORDER — METOPROLOL SUCCINATE ER 25 MG PO TB24
75.0000 mg | ORAL_TABLET | Freq: Every day | ORAL | Status: DC
  Administered 2019-10-18: 75 mg via ORAL
  Filled 2019-10-18: qty 3

## 2019-10-18 NOTE — ED Triage Notes (Signed)
EMS reports pt c/o dizziness and "little" nausea x 3 days.  EMS reports bp was 220/106, hr 94, 96% on room air cbg 119.

## 2019-10-18 NOTE — ED Provider Notes (Signed)
[facility] EMERGENCY DEPARTMENT Provider Note  CSN: [id number] Arrival date & time: 10/18/19 3825    History Chief Complaint  Patient presents with  . Dizziness    HPI  [name] is a 86 y.o. female with multiple medical problems reports she has been feeling dizzy when she has woken up the last 2 days. She reports feeling swimmy headed and nauseated but denies room spinning. She did not have a headache, fever, chest pain, SOB or vomiting. She states she got up and took her BP and it was 180/110s and so she decided to come to the ED for evaluation. She was recently admitted for afib with RVR and was cardioverted before discharge. She lives at home alone independently and mostly gets around in a wheelchair.    Past Medical History:  Diagnosis Date  . A-fib ([place])   . Atrial fibrillation ([place])   . Cervicalgia   . Colon cancer ([place])   . Degeneration of cervical intervertebral disc   . Hypertension   . Pinched nerve in shoulder   . Rheumatoid arthritis(714.0)   . RUQ pain   . Vulvar dystrophy   . Yeast infection     Past Surgical History:  Procedure Laterality Date  . ABDOMINAL HYSTERECTOMY  1960'S  . APPENDECTOMY  with hysterectomy  . BACK SURGERY  12/08/10   plate/screws C3 C4  . CARDIOVERSION N/A 09/29/2019   Procedure: CARDIOVERSION;  Surgeon: Arnoldo Lenis, MD;  [facility];  Service: Endoscopy;  Laterality: N/A;  pt ate early this AM per Dr. Harl Bowie  . CHOLECYSTECTOMY  2002  . COLONOSCOPY  04/02/2009  . COLONOSCOPY  06/04/05  . COLONOSCOPY N/A 07/26/2015   Procedure: COLONOSCOPY;  Surgeon: Rogene Houston, MD;  Location: [facility];  Service: Endoscopy;  Laterality: N/A;  1240  . COLONOSCOPY WITH ESOPHAGOGASTRODUODENOSCOPY (EGD) N/A 04/08/2012   Procedure: COLONOSCOPY WITH ESOPHAGOGASTRODUODENOSCOPY (EGD);  Surgeon: Rogene Houston, MD;  Location: [facility];  Service: Endoscopy;  Laterality: N/A;  130-moved to 245 Ann notified pt  .  ESOPHAGOGASTRODUODENOSCOPY N/A 07/26/2015   Procedure: ESOPHAGOGASTRODUODENOSCOPY (EGD);  Surgeon: Rogene Houston, MD;  Location: [facility];  Service: Endoscopy;  Laterality: N/A;  . HEMICOLECTOMY  10/05    Family History  Problem Relation Age of Onset  . Rheum arthritis Mother   . Parkinsonism Father   . Hiatal hernia Sister   . Healthy Brother   . Healthy Daughter   . Healthy Son   . Healthy Son     Social History   Tobacco Use  . Smoking status: Never Smoker  . Smokeless tobacco: Never Used  Vaping Use  . Vaping Use: Never used  Substance Use Topics  . Alcohol use: No  . Drug use: No     Home Medications Prior to Admission medications   Medication Sig Start Date End Date Taking? Authorizing Provider  acetaminophen (TYLENOL) 325 MG tablet Take 2 tablets (650 mg total) by mouth every 6 (six) hours as needed for mild pain or headache (or Fever >/= 101). 08/31/18   Barton Dubois, MD  apixaban (ELIQUIS) 2.5 MG TABS tablet Take 1 tablet (2.5 mg total) by mouth 2 (two) times daily. 09/08/18   Gerlene Fee, NP  aspirin 81 MG chewable tablet Chew 1 tablet (81 mg total) by mouth daily. Patient taking differently: Chew 81 mg by mouth at bedtime.  09/01/18   Barton Dubois, MD  cholecalciferol (VITAMIN D3) 25 MCG (1000 UT)  tablet Take 2,000 Units by mouth 2 (two) times daily.     [provider]  HYDROcodone-acetaminophen (NORCO) 7.5-325 MG tablet Take 1 tablet by mouth 3 (three) times daily as needed for moderate pain. 09/08/18   Gerlene Fee, NP  leflunomide (ARAVA) 20 MG tablet Take 1 tablet (20 mg total) by mouth daily. 09/08/18   Gerlene Fee, NP  meclizine (ANTIVERT) 12.5 MG tablet Take 1 tablet (12.5 mg total) by mouth 2 (two) times daily as needed for dizziness. Patient not taking: Reported on 10/06/2019 09/08/18   Gerlene Fee, NP  metoprolol succinate (TOPROL-XL) 25 MG 24 hr tablet Take 3 tablets (75 mg total) by mouth daily. 04/19/19   Herminio Commons, MD  omeprazole (PRILOSEC) 20 MG capsule TAKE 1 CAPSULE BY MOUTH TWICE DAILY 07/25/19   Laurine Blazer A, PA-C  rosuvastatin (CRESTOR) 20 MG tablet Take 1 tablet (20 mg total) by mouth daily at 6 PM. 09/08/18   Nyoka Cowden, Phylis Bougie, NP     Allergies    Iodinated diagnostic agents, Nerve block tray, Nitrofuran derivatives, Triamcinolone acetonide, and Vibra-tab [doxycycline]   Review of Systems   Review of Systems A comprehensive review of systems was completed and negative except as noted in HPI.    Physical Exam BP (!) 192/70 (BP Location: Right Arm)   Pulse 84   Temp 98.1 F (36.7 C) (Oral)   Resp 18   Ht 5\' 2"  (1.575 m)   Wt 54 kg   SpO2 93%   BMI 21.77 kg/m   Physical Exam Vitals and nursing note reviewed.  Constitutional:      Appearance: Normal appearance.  HENT:     Head: Normocephalic and atraumatic.     Nose: Nose normal.     Mouth/Throat:     Mouth: Mucous membranes are moist.  Eyes:     Extraocular Movements: Extraocular movements intact.     Conjunctiva/sclera: Conjunctivae normal.  Cardiovascular:     Rate and Rhythm: Normal rate.  Pulmonary:     Effort: Pulmonary effort is normal.     Breath sounds: Normal breath sounds.  Abdominal:     General: Abdomen is flat.     Palpations: Abdomen is soft.     Tenderness: There is no abdominal tenderness.  Musculoskeletal:        General: No swelling. Normal range of motion.     Cervical back: Neck supple.  Skin:    General: Skin is warm and dry.  Neurological:     General: No focal deficit present.     Mental Status: She is alert.  Psychiatric:        Mood and Affect: Mood normal.      ED Results / Procedures / Treatments   Labs (all labs ordered are listed, but only abnormal results are displayed) Labs Reviewed - No data to display  EKG EKG Interpretation  Date/Time:  Wednesday October 18 2019 09:54:16 EDT Ventricular Rate:  80 PR Interval:    QRS Duration: 98 QT Interval:  373 QTC  Calculation: 431 R Axis:   38 Text Interpretation: Sinus rhythm Nonspecific T abnrm, anterolateral leads No significant change since last tracing Confirmed by Calvert Cantor [id number]) on 10/18/2019 9:57:44 AM   Radiology No results found.  Procedures Procedures  Medications Ordered in the ED Medications - No data to display   MDM Rules/Calculators/A&P MDM Patient it nonspecific dizziness and HTN has not yet had her morning BP meds. Will check labs, CT  and give her morning meds. She does not have Afib/RVR today on EKG.  ED Course  I have reviewed the triage vital signs and the nursing notes.  Pertinent labs & imaging results that were available during my care of the patient were reviewed by me and considered in my medical decision making (see chart for details).  Clinical Course as of Oct 17 1209  Wed Oct 18, 2019  1116 CBC is normal.    [CS]  1202 UA and BMP are normal. CT neg for acute process.    [CS]  8022 Patient continues to feel better, no longer dizzy. BP is improving and she would like to go home. Advised close PCP follow up or RTED if symptoms worsen.    [CS]    Clinical Course User Index [CS] Truddie Hidden, MD    Final Clinical Impression(s) / ED Diagnoses Final diagnoses:  Dizziness    Rx / DC Orders ED Discharge Orders    None       Truddie Hidden, MD 10/18/19 1212

## 2019-10-19 DIAGNOSIS — I4891 Unspecified atrial fibrillation: Secondary | ICD-10-CM | POA: Diagnosis not present

## 2019-10-19 DIAGNOSIS — M069 Rheumatoid arthritis, unspecified: Secondary | ICD-10-CM | POA: Diagnosis not present

## 2019-10-19 DIAGNOSIS — I10 Essential (primary) hypertension: Secondary | ICD-10-CM | POA: Diagnosis not present

## 2019-10-19 DIAGNOSIS — G894 Chronic pain syndrome: Secondary | ICD-10-CM | POA: Diagnosis not present

## 2019-10-19 DIAGNOSIS — Z681 Body mass index (BMI) 19 or less, adult: Secondary | ICD-10-CM | POA: Diagnosis not present

## 2019-10-25 ENCOUNTER — Telehealth: Payer: Self-pay | Admitting: Cardiology

## 2019-10-25 NOTE — Telephone Encounter (Signed)
Pt called and states that her BP has been high after getting out of the hospital. She c/o nausea and dizziness. States that she took Clonidine 0.1 mg at 2100. Pt reports current BP as 163/90 HR 73. Please advise.

## 2019-10-25 NOTE — Telephone Encounter (Signed)
Can she come for a nursing visit with bp check and EKG check tomorrow. If has clonidine at home can take up to twice a day as needed for bp greater than 160, likely make a longer term change tomorrow   Zandra Abts MD

## 2019-10-25 NOTE — Telephone Encounter (Signed)
Pt notified and voiced understanding. Appt made for 1000 on tomorrow.

## 2019-10-25 NOTE — Telephone Encounter (Signed)
New message     Patient called this morning she just got out of hospital for having her heart out of rhythm , she said that she feels that her heart is out of rhythm again (did not have heart rate) said her bp is 198/103 , wanted to come in office today, told patient we could get her in with PA tomorrow in [place] and she refused.

## 2019-10-26 ENCOUNTER — Ambulatory Visit (INDEPENDENT_AMBULATORY_CARE_PROVIDER_SITE_OTHER): Payer: Medicare Other | Admitting: *Deleted

## 2019-10-26 ENCOUNTER — Other Ambulatory Visit: Payer: Self-pay

## 2019-10-26 VITALS — BP 118/62 | HR 78 | Ht 62.0 in | Wt 122.0 lb

## 2019-10-26 DIAGNOSIS — I4811 Longstanding persistent atrial fibrillation: Secondary | ICD-10-CM

## 2019-10-26 NOTE — Progress Notes (Signed)
Pt in office for EKG and BP check. Pt states that she feels fair today. States that she is a little dizzy.

## 2019-11-11 DIAGNOSIS — E7849 Other hyperlipidemia: Secondary | ICD-10-CM | POA: Diagnosis not present

## 2019-11-11 DIAGNOSIS — I480 Paroxysmal atrial fibrillation: Secondary | ICD-10-CM | POA: Diagnosis not present

## 2019-11-21 DIAGNOSIS — H353111 Nonexudative age-related macular degeneration, right eye, early dry stage: Secondary | ICD-10-CM | POA: Diagnosis not present

## 2019-11-27 ENCOUNTER — Other Ambulatory Visit: Payer: Self-pay

## 2019-11-27 ENCOUNTER — Encounter: Payer: Self-pay | Admitting: Internal Medicine

## 2019-11-27 ENCOUNTER — Ambulatory Visit: Payer: Medicare Other | Admitting: Internal Medicine

## 2019-11-27 VITALS — BP 120/68 | HR 103 | Temp 98.4°F | Wt 121.5 lb

## 2019-11-27 DIAGNOSIS — J849 Interstitial pulmonary disease, unspecified: Secondary | ICD-10-CM

## 2019-11-27 DIAGNOSIS — R0902 Hypoxemia: Secondary | ICD-10-CM | POA: Diagnosis not present

## 2019-11-27 NOTE — Addendum Note (Signed)
Addended by: Elie Confer on: 11/27/2019 03:28 PM   Modules accepted: Orders

## 2019-11-27 NOTE — Progress Notes (Signed)
[name]    [id number]    08-13-1933  Primary Care Physician:Fusco, Purcell Nails, MD Date of Appointment: 11/27/2019 Established Patient Visit   Chief complaint:  Short of breath   HPI: [name] is a 86 y.o. with rheumatoid arthritis with primarily joint involvement in her hands who is managed on leflunomide.  Initially presented to see me with shortness of breath.  Interval Updates: Since her last visit she has had pulmonary function testing which demonstrates mild restriction to ventilation, as well as CT chest which demonstrates very early fine fibrosis and subpleural reticulation which could be concerning with an early UIP pattern. Joint involvement is controlled on leflunomide.  Sees Dr. Dossie Der in rheum. She has had some falls and is now mostly wheelchair bound due to balance issues. Here with her son today. She gets sob with talking. Recent BP issues causing dizziness.   I have reviewed the patient's family social and past medical history and updated as appropriate.   Past Medical History:  Diagnosis Date  . A-fib ([place])   . Atrial fibrillation ([place])   . Cervicalgia   . Colon cancer ([place])   . Degeneration of cervical intervertebral disc   . Hypertension   . Pinched nerve in shoulder   . Rheumatoid arthritis(714.0)   . RUQ pain   . Vulvar dystrophy   . Yeast infection     Past Surgical History:  Procedure Laterality Date  . ABDOMINAL HYSTERECTOMY  1960'S  . APPENDECTOMY  with hysterectomy  . BACK SURGERY  12/08/10   plate/screws C3 C4  . CARDIOVERSION N/A 09/29/2019   Procedure: CARDIOVERSION;  Surgeon: Arnoldo Lenis, MD;  [facility];  Service: Endoscopy;  Laterality: N/A;  pt ate early this AM per Dr. Harl Bowie  . CHOLECYSTECTOMY  2002  . COLONOSCOPY  04/02/2009  . COLONOSCOPY  06/04/05  . COLONOSCOPY N/A 07/26/2015   Procedure: COLONOSCOPY;  Surgeon: Rogene Houston, MD;  [facility];  Service: Endoscopy;   Laterality: N/A;  1240  . COLONOSCOPY WITH ESOPHAGOGASTRODUODENOSCOPY (EGD) N/A 04/08/2012   Procedure: COLONOSCOPY WITH ESOPHAGOGASTRODUODENOSCOPY (EGD);  Surgeon: Rogene Houston, MD;  [facility];  Service: Endoscopy;  Laterality: N/A;  130-moved to 245 Ann notified pt  . ESOPHAGOGASTRODUODENOSCOPY N/A 07/26/2015   Procedure: ESOPHAGOGASTRODUODENOSCOPY (EGD);  Surgeon: Rogene Houston, MD;  [facility];  Service: Endoscopy;  Laterality: N/A;  . HEMICOLECTOMY  10/05    Family History  Problem Relation Age of Onset  . Rheum arthritis Mother   . Parkinsonism Father   . Hiatal hernia Sister   . Healthy Brother   . Healthy Daughter   . Healthy Son   . Healthy Son     Social History   Occupational History  . Not on file  Tobacco Use  . Smoking status: Never Smoker  . Smokeless tobacco: Never Used  Vaping Use  . Vaping Use: Never used  Substance and Sexual Activity  . Alcohol use: No  . Drug use: No  . Sexual activity: Never    Physical Exam: Blood pressure 120/68, pulse (!) 103, temperature 98.4 F (36.9 C), temperature source Tympanic, weight 121 lb 8 oz (55.1 kg), SpO2 92 %. Gen: thin frail, kyphosis Resp: few inspiratory squwaks, no crackles.  CV: tachycardic, regular Ext: no acute synovitis  Data Reviewed: Imaging: I have personally reviewed the CT chest from April 2020 which demonstrates mild bibasilar fine fibrosis, subpleural  reticulation, small centrilobular nodules.  There is no pneumonia no honeycombing PFTs:  PFT Results Latest Ref Rng & Units 09/27/2019 05/10/2019  FVC-Pre L - 1.55  FVC-Predicted Pre % 75 69  FVC-Post L - 1.68  FVC-Predicted Post % - 75  Pre FEV1/FVC % % 86 90  Post FEV1/FCV % % - 91  FEV1-Pre L 1.42 1.40  FEV1-Predicted Pre % 88 84  FEV1-Post L - 1.53  DLCO uncorrected ml/min/mmHg 14.41 15.58  DLCO UNC% % 83 89  DLCO corrected ml/min/mmHg 14.94 15.58  DLCO COR %Predicted % 86 89  DLVA Predicted % 102 130  TLC  L - 3.51  TLC % Predicted % - 73  RV % Predicted % - 75   I have personally reviewed the patient's PFTs and mild obstruction to ventilation.  Moderate diffusion limitation.  Labs:  Immunization status: Immunization History  Administered Date(s) Administered  . Fluad Quad(high Dose 65+) 09/30/2019  . Influenza, High Dose Seasonal PF 10/17/2018  . Influenza-Unspecified 10/12/2012, 10/06/2017  . Moderna SARS-COVID-2 Vaccination 02/26/2019, 03/26/2019  . PPD Test 08/31/2018  . Pneumococcal Conjugate-13 06/19/2014    Assessment:  Interstitial lung disease Rheumatoid arthritis   Plan/Recommendations: [name] has probable UIP secondary to her rheumatoid arthritis.  Her joint symptoms are currently controlled on leflunomide.  She sees Dr. Dossie Der for rheumatology at [facility].  Her PFTs from April to September are actually stable despite her feeling worse. We discussed a repeat CT scan vs monitoring with another set of PFTs in three months. She and her son would like to repeat in 3 months time and see how things look then. Continue to monitor expectantly for now.   I spent 35 minutes in the care of this patient today including pre-charting, chart review, review of results, face-to-face care, coordination of care and communication with consultants etc.).  Return to Care: Return in about 3 months (around 02/27/2020).   Lenice Llamas, MD Pulmonary and [place]

## 2019-11-27 NOTE — Patient Instructions (Signed)
The patient should have follow up scheduled with myself in 3 months.   Spirometry

## 2019-11-28 ENCOUNTER — Encounter: Payer: Self-pay | Admitting: Internal Medicine

## 2019-11-28 DIAGNOSIS — J849 Interstitial pulmonary disease, unspecified: Secondary | ICD-10-CM | POA: Diagnosis not present

## 2019-11-28 DIAGNOSIS — R0902 Hypoxemia: Secondary | ICD-10-CM | POA: Diagnosis not present

## 2019-12-05 DIAGNOSIS — G894 Chronic pain syndrome: Secondary | ICD-10-CM | POA: Diagnosis not present

## 2019-12-12 ENCOUNTER — Telehealth: Payer: Self-pay | Admitting: *Deleted

## 2019-12-12 NOTE — Telephone Encounter (Signed)
Pt notified that pt assistance Eliquis has arrived.

## 2020-01-09 DIAGNOSIS — Z6822 Body mass index (BMI) 22.0-22.9, adult: Secondary | ICD-10-CM | POA: Diagnosis not present

## 2020-01-09 DIAGNOSIS — G894 Chronic pain syndrome: Secondary | ICD-10-CM | POA: Diagnosis not present

## 2020-01-09 DIAGNOSIS — K05219 Aggressive periodontitis, localized, unspecified severity: Secondary | ICD-10-CM | POA: Diagnosis not present

## 2020-01-12 DIAGNOSIS — E7849 Other hyperlipidemia: Secondary | ICD-10-CM | POA: Diagnosis not present

## 2020-01-12 DIAGNOSIS — I480 Paroxysmal atrial fibrillation: Secondary | ICD-10-CM | POA: Diagnosis not present

## 2020-01-26 ENCOUNTER — Other Ambulatory Visit: Payer: Self-pay

## 2020-01-26 ENCOUNTER — Ambulatory Visit: Payer: Medicare Other | Admitting: Cardiology

## 2020-01-26 ENCOUNTER — Encounter: Payer: Self-pay | Admitting: Cardiology

## 2020-01-26 VITALS — BP 122/70 | HR 112 | Ht 62.0 in | Wt 115.0 lb

## 2020-01-26 DIAGNOSIS — I480 Paroxysmal atrial fibrillation: Secondary | ICD-10-CM

## 2020-01-26 NOTE — Progress Notes (Signed)
Clinical Summary [name] is a 87 y.o.female former patient of Dr Bronson Ing, this is our first visit together.   1. PAF - DCCV 09/2019 - occasional palpitations, can last up to 30 minutes - can nausea associated - occurs 1-2 times a month.  - compliant with eliquis.No bleeding on eliquis.   2. Interstitial lung disease - followed by pulm   3. Rheumatoid arthritis - followed by rheum   4. History of CVA   Has had covid vaccine x3.  Past Medical History:  Diagnosis Date  . A-fib ([place])   . Atrial fibrillation ([place])   . Cervicalgia   . Colon cancer ([place])   . Degeneration of cervical intervertebral disc   . Hypertension   . Pinched nerve in shoulder   . Rheumatoid arthritis(714.0)   . RUQ pain   . Vulvar dystrophy   . Yeast infection      Allergies  Allergen Reactions  . Iodinated Diagnostic Agents Anaphylaxis    08-12-10 Pt had severe reaction with itching/hives/loss of consciousness within 5 minutes of CEPI today.  She received both Kenalog and Iondinated Contrast, so not certain which agent caused reaction.  If patient returns for another CEPI, Dr. Jobe Igo considers using Decadron and a 13-hour prep.  jkl  . Nerve Block Tray Anaphylaxis    Patient states that she was sent to have Epidural for her shoulder. After rec'ving she remembers saying that she was itching all over  And that's it. She was transported to hospital  By EMS. Stayed overnight.  Bufford Spikes Derivatives Hives and Nausea And Vomiting  . Triamcinolone Acetonide Anaphylaxis    08-12-10  Pt had severe reaction with itching/hives/loss of consciousness within five minutes of CEPI today.  She received both Kenalog and Iodinated Contrast, so not certain which agent caused reaction.  If pt returns for another cervical epidural steroid injection, Dr. Jobe Igo suggests using Decadron and a 13-hr prep.  JKL PATIENT USES PROCTOCREAM 2.5% WITHOUT DIFFICULTY FOR VULVAR DYSTROPHY Patient says she can take  prednisone without any problem  . Vibra-Tab [Doxycycline] Nausea And Vomiting     Current Outpatient Medications  Medication Sig Dispense Refill  . acetaminophen (TYLENOL) 325 MG tablet Take 2 tablets (650 mg total) by mouth every 6 (six) hours as needed for mild pain or headache (or Fever >/= 101).    Marland Kitchen apixaban (ELIQUIS) 2.5 MG TABS tablet Take 1 tablet (2.5 mg total) by mouth 2 (two) times daily. 60 tablet 0  . aspirin 81 MG chewable tablet Chew 1 tablet (81 mg total) by mouth daily. (Patient taking differently: Chew 81 mg by mouth at bedtime. )    . cholecalciferol (VITAMIN D3) 25 MCG (1000 UT) tablet Take 2,000 Units by mouth 2 (two) times daily.     . cloNIDine (CATAPRES) 0.1 MG tablet Take 0.1 mg by mouth 2 (two) times daily.    Marland Kitchen HYDROcodone-acetaminophen (NORCO) 7.5-325 MG tablet Take 1 tablet by mouth 3 (three) times daily as needed for moderate pain. 15 tablet 0  . leflunomide (ARAVA) 20 MG tablet Take 1 tablet (20 mg total) by mouth daily. 30 tablet 0  . Magnesium 250 MG TABS Take 240 mg by mouth daily.    . meclizine (ANTIVERT) 12.5 MG tablet Take 1 tablet (12.5 mg total) by mouth 2 (two) times daily as needed for dizziness. 30 tablet 0  . metoprolol succinate (TOPROL-XL) 100 MG 24 hr tablet Take 100 mg by mouth daily.    Marland Kitchen  omeprazole (PRILOSEC) 20 MG capsule TAKE 1 CAPSULE BY MOUTH TWICE DAILY (Patient taking differently: Take 20 mg by mouth in the morning and at bedtime. ) 60 capsule 11  . Polyethyl Glycol-Propyl Glycol (SYSTANE) 0.4-0.3 % SOLN Apply 2 drops to eye daily as needed (dry eye).    . rosuvastatin (CRESTOR) 20 MG tablet Take 1 tablet (20 mg total) by mouth daily at 6 PM. 30 tablet 0   No current facility-administered medications for this visit.     Past Surgical History:  Procedure Laterality Date  . ABDOMINAL HYSTERECTOMY  1960'S  . APPENDECTOMY  with hysterectomy  . BACK SURGERY  12/08/10   plate/screws C3 C4  . CARDIOVERSION N/A 09/29/2019   Procedure:  CARDIOVERSION;  Surgeon: Arnoldo Lenis, MD;  [facility];  Service: Endoscopy;  Laterality: N/A;  pt ate early this AM per Dr. Harl Bowie  . CHOLECYSTECTOMY  2002  . COLONOSCOPY  04/02/2009  . COLONOSCOPY  06/04/05  . COLONOSCOPY N/A 07/26/2015   Procedure: COLONOSCOPY;  Surgeon: Rogene Houston, MD;  [facility];  Service: Endoscopy;  Laterality: N/A;  1240  . COLONOSCOPY WITH ESOPHAGOGASTRODUODENOSCOPY (EGD) N/A 04/08/2012   Procedure: COLONOSCOPY WITH ESOPHAGOGASTRODUODENOSCOPY (EGD);  Surgeon: Rogene Houston, MD;  [facility];  Service: Endoscopy;  Laterality: N/A;  130-moved to 245 Ann notified pt  . ESOPHAGOGASTRODUODENOSCOPY N/A 07/26/2015   Procedure: ESOPHAGOGASTRODUODENOSCOPY (EGD);  Surgeon: Rogene Houston, MD;  [facility];  Service: Endoscopy;  Laterality: N/A;  . HEMICOLECTOMY  10/05     Allergies  Allergen Reactions  . Iodinated Diagnostic Agents Anaphylaxis    08-12-10 Pt had severe reaction with itching/hives/loss of consciousness within 5 minutes of CEPI today.  She received both Kenalog and Iondinated Contrast, so not certain which agent caused reaction.  If patient returns for another CEPI, Dr. Jobe Igo considers using Decadron and a 13-hour prep.  jkl  . Nerve Block Tray Anaphylaxis    Patient states that she was sent to have Epidural for her shoulder. After rec'ving she remembers saying that she was itching all over  And that's it. She was transported to hospital  By EMS. Stayed overnight.  Bufford Spikes Derivatives Hives and Nausea And Vomiting  . Triamcinolone Acetonide Anaphylaxis    08-12-10  Pt had severe reaction with itching/hives/loss of consciousness within five minutes of CEPI today.  She received both Kenalog and Iodinated Contrast, so not certain which agent caused reaction.  If pt returns for another cervical epidural steroid injection, Dr. Jobe Igo suggests using Decadron and a 13-hr prep.  JKL PATIENT USES  PROCTOCREAM 2.5% WITHOUT DIFFICULTY FOR VULVAR DYSTROPHY Patient says she can take prednisone without any problem  . Vibra-Tab [Doxycycline] Nausea And Vomiting      Family History  Problem Relation Age of Onset  . Rheum arthritis Mother   . Parkinsonism Father   . Hiatal hernia Sister   . Healthy Brother   . Healthy Daughter   . Healthy Son   . Healthy Son      Social History [name] reports that she has never smoked. She has never used smokeless tobacco. [name] reports no history of alcohol use.   Review of Systems CONSTITUTIONAL: No weight loss, fever, chills, weakness or fatigue.  HEENT: Eyes: No visual loss, blurred vision, double vision or yellow sclerae.No hearing loss, sneezing, congestion, runny nose or sore throat.  SKIN: No rash or itching.  CARDIOVASCULAR: per hpi RESPIRATORY: chronic sob GASTROINTESTINAL:  No anorexia, nausea, vomiting or diarrhea. No abdominal pain or blood.  GENITOURINARY: No burning on urination, no polyuria NEUROLOGICAL: No headache, dizziness, syncope, paralysis, ataxia, numbness or tingling in the extremities. No change in bowel or bladder control.  MUSCULOSKELETAL: No muscle, back pain, joint pain or stiffness.  LYMPHATICS: No enlarged nodes. No history of splenectomy.  PSYCHIATRIC: No history of depression or anxiety.  ENDOCRINOLOGIC: No reports of sweating, cold or heat intolerance. No polyuria or polydipsia.  Marland Kitchen   Physical Examination Today's Vitals   01/26/20 1350  BP: 122/70  Pulse: (!) 112  SpO2: 94%  Weight: 115 lb (52.2 kg)  Height: 5\' 2"  (1.575 m)   Body mass index is 21.03 kg/m.  Gen: resting comfortably, no acute distress HEENT: no scleral icterus, pupils equal round and reactive, no palptable cervical adenopathy,  CV: regular, tach, no m/r/g, no jvd Resp: bilaterla dry crackles GI: abdomen is soft, non-tender, non-distended, normal bowel sounds, no hepatosplenomegaly MSK: extremities are warm, no edema.   Skin: warm, no rash Neuro:  no focal deficits Psych: appropriate affect   Diagnostic Studies Echocardiogram: 09/2019 IMPRESSIONS    1. Left ventricular ejection fraction, by estimation, is 70 to 75%. The  left ventricle has normal function. The left ventricle has no regional  wall motion abnormalities. Left ventricular diastolic parameters are  indeterminate.  2. Right ventricular systolic function is normal. The right ventricular  size is normal.  3. Left atrial size was mildly dilated.  4. The mitral valve is normal in structure. Trivial mitral valve  regurgitation. No evidence of mitral stenosis.  5. The aortic valve is tricuspid. Aortic valve regurgitation is mild. No  aortic stenosis is present.  6. The inferior vena cava is normal in size with greater than 50%  respiratory variability, suggesting right atrial pressure of 3 mmHg.     Assessment and Plan  1. PAF - EKG today shows NSR - mild infrequent symptoms, continue toprol for rate control, eliquis for stroke prevention        Arnoldo Lenis, M.D.,

## 2020-01-26 NOTE — Patient Instructions (Signed)
Medication Instructions:  Your physician recommends that you continue on your current medications as directed. Please refer to the Current Medication list given to you today.  *If you need a refill on your cardiac medications before your next appointment, please call your pharmacy*   Lab Work: None today If you have labs (blood work) drawn today and your tests are completely normal, you will receive your results only by: Marland Kitchen MyChart Message (if you have MyChart) OR . A paper copy in the mail If you have any lab test that is abnormal or we need to change your treatment, we will call you to review the results.   Testing/Procedures: none today   Follow-Up: At [facility], you and your health needs are our priority.  As part of our continuing mission to provide you with exceptional heart care, we have created designated Provider Care Teams.  These Care Teams include your primary Cardiologist (physician) and Advanced Practice Providers (APPs -  Physician Assistants and Nurse Practitioners) who all work together to provide you with the care you need, when you need it.  We recommend signing up for the patient portal called "MyChart".  Sign up information is provided on this After Visit Summary.  MyChart is used to connect with patients for Virtual Visits (Telemedicine).  Patients are able to view lab/test results, encounter notes, upcoming appointments, etc.  Non-urgent messages can be sent to your provider as well.   To learn more about what you can do with MyChart, go to [email or web address].    Your next appointment:   6 month(s)  The format for your next appointment:   In Person  Provider:   Carlyle Dolly, MD   Other Instructions None      Thank you for choosing [facility] !

## 2020-01-31 NOTE — Addendum Note (Signed)
Addended by: Levonne Hubert on: 01/31/2020 10:02 AM   Modules accepted: Orders

## 2020-02-08 DIAGNOSIS — M5030 Other cervical disc degeneration, unspecified cervical region: Secondary | ICD-10-CM | POA: Diagnosis not present

## 2020-02-08 DIAGNOSIS — I10 Essential (primary) hypertension: Secondary | ICD-10-CM | POA: Diagnosis not present

## 2020-02-08 DIAGNOSIS — Z0001 Encounter for general adult medical examination with abnormal findings: Secondary | ICD-10-CM | POA: Diagnosis not present

## 2020-02-08 DIAGNOSIS — E559 Vitamin D deficiency, unspecified: Secondary | ICD-10-CM | POA: Diagnosis not present

## 2020-02-08 DIAGNOSIS — M069 Rheumatoid arthritis, unspecified: Secondary | ICD-10-CM | POA: Diagnosis not present

## 2020-02-08 DIAGNOSIS — E7849 Other hyperlipidemia: Secondary | ICD-10-CM | POA: Diagnosis not present

## 2020-02-08 DIAGNOSIS — Z6821 Body mass index (BMI) 21.0-21.9, adult: Secondary | ICD-10-CM | POA: Diagnosis not present

## 2020-02-08 DIAGNOSIS — Z1389 Encounter for screening for other disorder: Secondary | ICD-10-CM | POA: Diagnosis not present

## 2020-02-14 DIAGNOSIS — H6123 Impacted cerumen, bilateral: Secondary | ICD-10-CM | POA: Diagnosis not present

## 2020-02-15 ENCOUNTER — Emergency Department (HOSPITAL_COMMUNITY): Payer: Medicare Other

## 2020-02-15 ENCOUNTER — Telehealth: Payer: Self-pay | Admitting: Cardiology

## 2020-02-15 ENCOUNTER — Other Ambulatory Visit: Payer: Self-pay

## 2020-02-15 ENCOUNTER — Observation Stay (HOSPITAL_COMMUNITY)
Admission: EM | Admit: 2020-02-15 | Discharge: 2020-02-16 | Disposition: A | Discharge status: Home / Self Care | Payer: Medicare Other | Attending: Family Medicine | Admitting: Family Medicine

## 2020-02-15 ENCOUNTER — Encounter (HOSPITAL_COMMUNITY): Payer: Self-pay | Admitting: *Deleted

## 2020-02-15 DIAGNOSIS — E785 Hyperlipidemia, unspecified: Secondary | ICD-10-CM | POA: Diagnosis not present

## 2020-02-15 DIAGNOSIS — J849 Interstitial pulmonary disease, unspecified: Secondary | ICD-10-CM

## 2020-02-15 DIAGNOSIS — I639 Cerebral infarction, unspecified: Secondary | ICD-10-CM | POA: Diagnosis present

## 2020-02-15 DIAGNOSIS — Z85038 Personal history of other malignant neoplasm of large intestine: Secondary | ICD-10-CM | POA: Insufficient documentation

## 2020-02-15 DIAGNOSIS — I499 Cardiac arrhythmia, unspecified: Secondary | ICD-10-CM | POA: Diagnosis not present

## 2020-02-15 DIAGNOSIS — Z79899 Other long term (current) drug therapy: Secondary | ICD-10-CM | POA: Insufficient documentation

## 2020-02-15 DIAGNOSIS — Z20822 Contact with and (suspected) exposure to covid-19: Secondary | ICD-10-CM | POA: Insufficient documentation

## 2020-02-15 DIAGNOSIS — J42 Unspecified chronic bronchitis: Secondary | ICD-10-CM | POA: Diagnosis present

## 2020-02-15 DIAGNOSIS — Z8673 Personal history of transient ischemic attack (TIA), and cerebral infarction without residual deficits: Secondary | ICD-10-CM | POA: Insufficient documentation

## 2020-02-15 DIAGNOSIS — I4891 Unspecified atrial fibrillation: Principal | ICD-10-CM | POA: Diagnosis present

## 2020-02-15 DIAGNOSIS — Z7901 Long term (current) use of anticoagulants: Secondary | ICD-10-CM | POA: Insufficient documentation

## 2020-02-15 DIAGNOSIS — R0600 Dyspnea, unspecified: Secondary | ICD-10-CM | POA: Diagnosis not present

## 2020-02-15 DIAGNOSIS — I10 Essential (primary) hypertension: Secondary | ICD-10-CM | POA: Diagnosis present

## 2020-02-15 DIAGNOSIS — M069 Rheumatoid arthritis, unspecified: Secondary | ICD-10-CM | POA: Diagnosis present

## 2020-02-15 HISTORY — DX: Other specified chronic obstructive pulmonary disease: J44.89

## 2020-02-15 HISTORY — DX: Chronic obstructive pulmonary disease, unspecified: J44.9

## 2020-02-15 LAB — CBC
HCT: 35.7 % — ABNORMAL LOW (ref 36.0–46.0)
Hemoglobin: 11 g/dL — ABNORMAL LOW (ref 12.0–15.0)
MCH: 30.4 pg (ref 26.0–34.0)
MCHC: 30.8 g/dL (ref 30.0–36.0)
MCV: 98.6 fL (ref 80.0–100.0)
Platelets: 189 10*3/uL (ref 150–400)
RBC: 3.62 MIL/uL — ABNORMAL LOW (ref 3.87–5.11)
RDW: 18.2 % — ABNORMAL HIGH (ref 11.5–15.5)
WBC: 8.4 10*3/uL (ref 4.0–10.5)
nRBC: 1.9 % — ABNORMAL HIGH (ref 0.0–0.2)

## 2020-02-15 LAB — BASIC METABOLIC PANEL
Anion gap: 9 (ref 5–15)
BUN: 33 mg/dL — ABNORMAL HIGH (ref 8–23)
CO2: 28 mmol/L (ref 22–32)
Calcium: 7.6 mg/dL — ABNORMAL LOW (ref 8.9–10.3)
Chloride: 105 mmol/L (ref 98–111)
Creatinine, Ser: 0.69 mg/dL (ref 0.44–1.00)
GFR, Estimated: >60 mL/min (ref >60)
Glucose, Bld: 111 mg/dL — ABNORMAL HIGH (ref 70–99)
Potassium: 3.5 mmol/L (ref 3.5–5.1)
Sodium: 142 mmol/L (ref 135–145)

## 2020-02-15 LAB — SARS CORONAVIRUS 2 BY RT PCR (HOSPITAL ORDER, PERFORMED IN ~~LOC~~ HOSPITAL LAB): SARS Coronavirus 2: NEGATIVE

## 2020-02-15 LAB — MAGNESIUM: Magnesium: 1.9 mg/dL (ref 1.7–2.4)

## 2020-02-15 LAB — TSH: TSH: 0.385 u[IU]/mL (ref 0.350–4.500)

## 2020-02-15 MED ORDER — PANTOPRAZOLE SODIUM 40 MG PO TBEC: 40.0000 mg | DELAYED_RELEASE_TABLET | Freq: Every day | ORAL | Status: DC

## 2020-02-15 MED ORDER — PREDNISONE 20 MG PO TABS: 30.0000 mg | ORAL_TABLET | ORAL | Status: DC

## 2020-02-15 MED ORDER — ACETAMINOPHEN 325 MG PO TABS: 650.0000 mg | ORAL_TABLET | ORAL | Status: DC | PRN

## 2020-02-15 MED ORDER — DILTIAZEM HCL-DEXTROSE 125-5 MG/125ML-% IV SOLN (PREMIX)
5.0000 mg/h | INTRAVENOUS | Status: DC
  Administered 2020-02-15: 5 mg/h via INTRAVENOUS
  Administered 2020-02-16: 12.5 mg/h via INTRAVENOUS
  Filled 2020-02-15 (×2): qty 125

## 2020-02-15 MED ORDER — PREDNISONE 20 MG PO TABS
30.0000 mg | ORAL_TABLET | Freq: Every day | ORAL | Status: AC
  Administered 2020-02-16: 30 mg via ORAL
  Filled 2020-02-15: qty 1

## 2020-02-15 MED ORDER — LEFLUNOMIDE 20 MG PO TABS
20.0000 mg | ORAL_TABLET | Freq: Every day | ORAL | Status: DC
Start: 2020-02-16 — End: 2020-02-16
  Administered 2020-02-16: 20 mg via ORAL
  Filled 2020-02-15 (×4): qty 1

## 2020-02-15 MED ORDER — SODIUM CHLORIDE 0.9 % IV BOLUS
500.0000 mL | Freq: Once | INTRAVENOUS | Status: AC
  Administered 2020-02-15: 500 mL via INTRAVENOUS

## 2020-02-15 MED ORDER — PANTOPRAZOLE SODIUM 40 MG PO TBEC
40.0000 mg | DELAYED_RELEASE_TABLET | Freq: Two times a day (BID) | ORAL | Status: DC
  Administered 2020-02-15 – 2020-02-16 (×2): 40 mg via ORAL
  Filled 2020-02-15 (×2): qty 1

## 2020-02-15 MED ORDER — PREDNISONE 10 MG PO TABS: 10.0000 mg | ORAL_TABLET | Freq: Every day | ORAL | Status: DC

## 2020-02-15 MED ORDER — SODIUM CHLORIDE 0.9 % IV SOLN: INTRAVENOUS | Status: DC

## 2020-02-15 MED ORDER — METOPROLOL TARTRATE 5 MG/5ML IV SOLN
5.0000 mg | Freq: Once | INTRAVENOUS | Status: AC
  Administered 2020-02-15: 5 mg via INTRAVENOUS
  Filled 2020-02-15: qty 5

## 2020-02-15 MED ORDER — VITAMIN D 25 MCG (1000 UNIT) PO TABS
2000.0000 [IU] | ORAL_TABLET | Freq: Two times a day (BID) | ORAL | Status: DC
  Administered 2020-02-15 – 2020-02-16 (×2): 2000 [IU] via ORAL
  Filled 2020-02-15 (×2): qty 2

## 2020-02-15 MED ORDER — OMEGA-3-ACID ETHYL ESTERS 1 G PO CAPS
1.0000 g | ORAL_CAPSULE | Freq: Every day | ORAL | Status: DC
  Administered 2020-02-16: 1 g via ORAL
  Filled 2020-02-15: qty 1

## 2020-02-15 MED ORDER — HYDROCODONE-ACETAMINOPHEN 7.5-325 MG PO TABS
1.0000 | ORAL_TABLET | Freq: Three times a day (TID) | ORAL | Status: DC | PRN
  Administered 2020-02-15: 1 via ORAL
  Filled 2020-02-15: qty 1

## 2020-02-15 MED ORDER — APIXABAN 2.5 MG PO TABS
2.5000 mg | ORAL_TABLET | Freq: Two times a day (BID) | ORAL | Status: DC
  Administered 2020-02-15 – 2020-02-16 (×2): 2.5 mg via ORAL
  Filled 2020-02-15 (×2): qty 1

## 2020-02-15 MED ORDER — ONDANSETRON HCL 4 MG/2ML IJ SOLN: 4.0000 mg | Freq: Four times a day (QID) | INTRAMUSCULAR | Status: DC | PRN

## 2020-02-15 MED ORDER — ROSUVASTATIN CALCIUM 20 MG PO TABS
20.0000 mg | ORAL_TABLET | Freq: Every day | ORAL | Status: DC
Start: 2020-02-16 — End: 2020-02-16

## 2020-02-15 MED ORDER — METOPROLOL TARTRATE 50 MG PO TABS
50.0000 mg | ORAL_TABLET | Freq: Two times a day (BID) | ORAL | Status: DC
  Administered 2020-02-15 – 2020-02-16 (×2): 50 mg via ORAL
  Filled 2020-02-15 (×2): qty 1

## 2020-02-15 MED ORDER — PREDNISONE 20 MG PO TABS
20.0000 mg | ORAL_TABLET | Freq: Every day | ORAL | Status: DC
  Filled 2020-02-15: qty 1

## 2020-02-15 MED ORDER — DILTIAZEM LOAD VIA INFUSION
10.0000 mg | Freq: Once | INTRAVENOUS | Status: AC
  Administered 2020-02-15: 10 mg via INTRAVENOUS
  Filled 2020-02-15: qty 10

## 2020-02-15 NOTE — ED Provider Notes (Signed)
[facility] EMERGENCY DEPARTMENT Provider Note   CSN: [id number] Arrival date & time: 02/15/20  1352     History Chief Complaint  Patient presents with  . Atrial Fibrillation    [name] is a 87 y.o. female.  HPI She presents for evaluation of rapid heartbeat for 1 week, without improvement using usual medications.  She has known atrial fibrillation, and has had prior cardioversion procedures done.  She denies recent illnesses including fever, vomiting, dizziness or weakness.  She ate breakfast but skipped lunch today.  She is here with her son.  She is currently taking prednisone for back pain, and had a injection to her back, for pain relief, about 10 days ago.  No known sick contacts.  There are no other known modifying factors.    Past Medical History:  Diagnosis Date  . A-fib ([place])   . Atrial fibrillation ([place])   . Cervicalgia   . Colon cancer ([place])   . Degeneration of cervical intervertebral disc   . Hypertension   . Pinched nerve in shoulder   . Rheumatoid arthritis(714.0)   . RUQ pain   . Vulvar dystrophy   . Yeast infection     Patient Active Problem List   Diagnosis Date Noted  . Physical deconditioning   . Atrial fibrillation with rapid ventricular response ([place]) 09/28/2019  . ILD (interstitial lung disease) ([place]) 08/31/2019  . Shortness of breath 08/31/2019  . Vitamin D deficiency 09/18/2018  . Chronic back pain greater than 3 months duration 09/06/2018  . GERD without esophagitis 09/04/2018  . Chronic vertigo 09/04/2018  . Hypomagnesemia 09/04/2018  . Rheumatoid arthritis ([place])   . TIA (transient ischemic attack) 08/25/2018  . CVA (cerebral vascular accident) Acute subcentimeter infarction in the right [place] 08/25/2018  . Chronic anticoagulation/Eliquis for Afib 08/25/2018  . Acute CVA (cerebrovascular accident) ([place]) 08/25/2018  . Chronic bilateral low back pain without sciatica 09/02/2017  . Neuralgia, post-herpetic 09/02/2017  .  Wedge compression fracture of first lumbar vertebra, initial encounter for closed fracture ([place]) 05/06/2017  . CAP (community acquired pneumonia) 02/24/2017  . Acute respiratory failure with hypoxia ([place]) 02/23/2017  . Bronchitis 02/22/2017  . Hypoxia 02/22/2017  . Generalized weakness 02/22/2017  . RAD (reactive airway disease) with wheezing, mild intermittent, with acute exacerbation 02/22/2017  . Impingement syndrome of shoulder, left 02/17/2017  . History of repair of rotator cuff 11/05/2016  . Acute pain of left shoulder due to trauma 10/15/2016  . Complete tear of left rotator cuff 10/15/2016  . Cervical radiculopathy 09/24/2016  . Shoulder pain, left 09/24/2016  . Vulvar dystrophy 05/23/2014  . Atrial fibrillation ([place]) 08/10/2013  . Essential hypertension 07/04/2013  . Tachycardia 01/31/2013  . Dystrophy, vulva 05/04/2012  . Abdominal pain, epigastric 03/28/2012  . Dyspepsia 04/14/2011  . History of colon cancer 03/17/2011  . Diarrhea 03/17/2011  . HLD (hyperlipidemia) 11/30/2006  . BRONCHIECTASIS 11/30/2006  . RHEUMATOID ARTHRITIS 11/30/2006    Past Surgical History:  Procedure Laterality Date  . ABDOMINAL HYSTERECTOMY  1960'S  . APPENDECTOMY  with hysterectomy  . BACK SURGERY  12/08/10   plate/screws C3 C4  . CARDIOVERSION N/A 09/29/2019   Procedure: CARDIOVERSION;  Surgeon: Arnoldo Lenis, MD;  Location: [facility] ENDO SUITE;  Service: Endoscopy;  Laterality: N/A;  pt ate early this AM per Dr. Harl Bowie  . CHOLECYSTECTOMY  2002  . COLONOSCOPY  04/02/2009  . COLONOSCOPY  06/04/05  . COLONOSCOPY N/A 07/26/2015   Procedure: COLONOSCOPY;  Surgeon: Rogene Houston,  MD;  Location: [facility] ENDO SUITE;  Service: Endoscopy;  Laterality: N/A;  1240  . COLONOSCOPY WITH ESOPHAGOGASTRODUODENOSCOPY (EGD) N/A 04/08/2012   Procedure: COLONOSCOPY WITH ESOPHAGOGASTRODUODENOSCOPY (EGD);  Surgeon: Rogene Houston, MD;  Location: [facility] ENDO SUITE;  Service: Endoscopy;  Laterality: N/A;  130-moved to  245 Ann notified pt  . ESOPHAGOGASTRODUODENOSCOPY N/A 07/26/2015   Procedure: ESOPHAGOGASTRODUODENOSCOPY (EGD);  Surgeon: Rogene Houston, MD;  Location: [facility] ENDO SU[facility];  Service: Endoscopy;  Laterality: N/A;  . HEMICOLECTOMY  10/05     OB History    Gravida  4   Para  4   Term      Preterm      AB      Living  4     SAB      IAB      Ectopic      Multiple      Live Births  4           Family History  Problem Relation Age of Onset  . Rheum arthritis Mother   . Parkinsonism Father   . Hiatal hernia Sister   . Healthy Brother   . Healthy Daughter   . Healthy Son   . Healthy Son     Social History   Tobacco Use  . Smoking status: Never Smoker  . Smokeless tobacco: Never Used  Vaping Use  . Vaping Use: Never used  Substance Use Topics  . Alcohol use: No  . Drug use: No    Home Medications Prior to Admission medications   Medication Sig Start Date End Date Taking? Authorizing Provider  apixaban (ELIQUIS) 2.5 MG TABS tablet Take 1 tablet (2.5 mg total) by mouth 2 (two) times daily. 09/08/18  Yes Gerlene Fee, NP  cholecalciferol (VITAMIN D3) 25 MCG (1000 UT) tablet Take 2,000 Units by mouth 2 (two) times daily.    Yes [provider]  HYDROcodone-acetaminophen (NORCO) 7.5-325 MG tablet Take 1 tablet by mouth 3 (three) times daily as needed for moderate pain. 09/08/18  Yes Gerlene Fee, NP  leflunomide (ARAVA) 20 MG tablet Take 1 tablet (20 mg total) by mouth daily. 09/08/18  Yes Gerlene Fee, NP  metoprolol succinate (TOPROL-XL) 100 MG 24 hr tablet Take 100 mg by mouth daily. 10/19/19  Yes [provider]  Omega-3 Fatty Acids (FISH OIL) 1000 MG CAPS Take 1 capsule by mouth daily.   Yes [provider]  omeprazole (PRILOSEC) 20 MG capsule TAKE 1 CAPSULE BY MOUTH TWICE DAILY Patient taking differently: Take 20 mg by mouth in the morning and at bedtime. 07/25/19  Yes Laurine Blazer B, PA-C  Polyethyl Glycol-Propyl Glycol  (SYSTANE) 0.4-0.3 % SOLN Apply 2 drops to eye daily as needed (dry eye).   Yes [provider]  predniSONE (DELTASONE) 10 MG tablet Take 10 mg by mouth See admin instructions. Take 5 tablets by mouth for 3 days, 4 tabs for 3 days, 3 tabs for 3 days, 2 tabs for 3 days and 1 tab for 3 days. 02/08/20  Yes [provider]  rosuvastatin (CRESTOR) 20 MG tablet Take 1 tablet (20 mg total) by mouth daily at 6 PM. 09/08/18  Yes Green, Phylis Bougie, NP  meclizine (ANTIVERT) 12.5 MG tablet Take 1 tablet (12.5 mg total) by mouth 2 (two) times daily as needed for dizziness. Patient not taking: Reported on 02/15/2020 09/08/18   Gerlene Fee, NP    Allergies    Iodinated diagnostic agents, Nerve block tray,  Nitrofuran derivatives, Triamcinolone acetonide, and Vibra-tab [doxycycline]  Review of Systems   Review of Systems  All other systems reviewed and are negative.   Physical Exam Updated Vital Signs BP 125/60   Pulse (!) 118   Temp 97.7 F (36.5 C) (Oral)   Resp (!) 25   Ht 5\' 2"  (1.575 m)   Wt 52.2 kg   SpO2 (!) 89%   BMI 21.03 kg/m   Physical Exam Vitals and nursing note reviewed.  Constitutional:      General: She is not in acute distress.    Appearance: She is well-developed and well-nourished. She is not ill-appearing, toxic-appearing or diaphoretic.     Comments: Elderly, frail  HENT:     Head: Normocephalic and atraumatic.     Right Ear: External ear normal.     Left Ear: External ear normal.  Eyes:     Extraocular Movements: EOM normal.     Conjunctiva/sclera: Conjunctivae normal.     Pupils: Pupils are equal, round, and reactive to light.  Neck:     Trachea: Phonation normal.  Cardiovascular:     Rate and Rhythm: Tachycardia present. Rhythm irregular.     Heart sounds: Normal heart sounds.  Pulmonary:     Effort: Pulmonary effort is normal. No respiratory distress.     Breath sounds: Normal breath sounds. No stridor.  Chest:     Chest wall: No bony  tenderness.  Abdominal:     General: There is no distension.     Palpations: Abdomen is soft.     Tenderness: There is no abdominal tenderness.  Musculoskeletal:        General: Normal range of motion.     Cervical back: Normal range of motion and neck supple.     Right lower leg: No edema.     Left lower leg: No edema.  Skin:    General: Skin is warm, dry and intact.  Neurological:     Mental Status: She is alert and oriented to person, place, and time.     Cranial Nerves: No cranial nerve deficit.     Sensory: No sensory deficit.     Motor: No abnormal muscle tone.     Coordination: Coordination normal.  Psychiatric:        Mood and Affect: Mood and affect and mood normal.        Behavior: Behavior normal.        Thought Content: Thought content normal.        Judgment: Judgment normal.     ED Results / Procedures / Treatments   Labs (all labs ordered are listed, but only abnormal results are displayed) Labs Reviewed  BASIC METABOLIC PANEL - Abnormal; Notable for the following components:      Result Value   Glucose, Bld 111 (*)    BUN 33 (*)    Calcium 7.6 (*)    All other components within normal limits  CBC - Abnormal; Notable for the following components:   RBC 3.62 (*)    Hemoglobin 11.0 (*)    HCT 35.7 (*)    RDW 18.2 (*)    nRBC 1.9 (*)    All other components within normal limits  SARS CORONAVIRUS 2 BY RT PCR [facility] ORDER, [place] [facility])  MAGNESIUM  TSH    EKG EKG Interpretation  Date/Time:  Thursday February 15 2020 14:22:32 EST Ventricular Rate:  140 PR Interval:    QRS Duration: 77 QT Interval:  335 QTC Calculation: 489 R Axis:   12 Text Interpretation: Atrial fibrillation Low voltage, precordial leads Repolarization abnormality, prob rate related Borderline prolonged QT interval Baseline wander in lead(s) I II III aVR aVF V1 similar to prior today Confirmed by Aletta Edouard [phone]) on 02/15/2020 2:26:41  PM   Radiology DG Chest Port 1 View  Result Date: 02/15/2020 CLINICAL DATA:  Dyspnea, arrhythmia EXAM: PORTABLE CHEST 1 VIEW COMPARISON:  09/28/2019 chest radiograph. FINDINGS: Stable cardiomediastinal silhouette with normal heart size. No pneumothorax. No pleural effusion. Stable mild elevation of the right hemidiaphragm. No pulmonary edema. No acute consolidative airspace disease. IMPRESSION: No active disease. Electronically Signed   By: Ilona Sorrel M.D.   On: 02/15/2020 15:46    Procedures .Critical Care Performed by: Daleen Bo, MD Authorized by: Daleen Bo, MD   Critical care provider statement:    Critical care time (minutes):  50   Critical care start time:  02/15/2020 3:08 PM   Critical care end time:  02/15/2020 9:02 PM   Critical care time was exclusive of:  Separately billable procedures and treating other patients   Critical care was necessary to treat or prevent imminent or life-threatening deterioration of the following conditions:  Cardiac failure   Critical care was time spent personally by me on the following activities:  Blood draw for specimens, development of treatment plan with patient or surrogate, discussions with consultants, evaluation of patient's response to treatment, examination of patient, obtaining history from patient or surrogate, ordering and performing treatments and interventions, ordering and review of laboratory studies, pulse oximetry, re-evaluation of patient's condition, review of old charts and ordering and review of radiographic studies     Medications Ordered in ED Medications  0.9 %  sodium chloride infusion ( Intravenous New Bag/Given 02/15/20 1658)  diltiazem (CARDIZEM) 1 mg/mL load via infusion 10 mg (has no administration in time range)    And  diltiazem (CARDIZEM) 125 mg in dextrose 5% 125 mL (1 mg/mL) infusion (5 mg/hr Intravenous New Bag/Given 02/15/20 1910)  sodium chloride 0.9 % bolus 500 mL (0 mLs Intravenous Stopped 02/15/20 1606)   metoprolol tartrate (LOPRESSOR) injection 5 mg (5 mg Intravenous Given 02/15/20 1646)    ED Course  I have reviewed the triage vital signs and the nursing notes.  Pertinent labs & imaging results that were available during my care of the patient were reviewed by me and considered in my medical decision making (see chart for details).  Clinical Course as of 02/15/20 2102  Thu Feb 15, 2020  1635 Given Lopressor 5 mg IV, transient improvement of heart rate from 1 50-1 20, still irregular.  She did not convert to sinus rhythm with this maneuver. [EW]    Clinical Course User Index [EW] Daleen Bo, MD   MDM Rules/Calculators/A&P                           Patient Vitals for the past 24 hrs:  BP Temp Temp src Pulse Resp SpO2 Height Weight  02/15/20 2030 125/60 -- -- (!) 118 (!) 25 (!) 89 % -- --  02/15/20 2000 131/74 -- -- 86 (!) 22 91 % -- --  02/15/20 1930 117/72 -- -- 79 20 94 % -- --  02/15/20 1900 (!) 107/91 -- -- (!) 163 (!) 30 (!) 88 % -- --  02/15/20 1830 99/79 -- -- (!) 157 (!) 36 90 % -- --  02/15/20 1800 120/88 -- -- Marland Kitchen 163 Marland Kitchen)  26 95 % -- --  02/15/20 1750 105/86 -- -- (!) 148 (!) 26 (!) 87 % -- --  02/15/20 1700 118/79 -- -- (!) 122 20 95 % -- --  02/15/20 1630 (!) 116/93 -- -- (!) 155 (!) 27 91 % -- --  02/15/20 1600 99/79 -- -- (!) 156 (!) 24 91 % -- --  02/15/20 1500 114/75 -- -- (!) 152 (!) 26 93 % -- --  02/15/20 1445 -- -- -- (!) 174 (!) 26 93 % -- --  02/15/20 1430 118/79 -- -- 86 (!) 23 90 % -- --  02/15/20 1429 -- -- -- 89 (!) 24 92 % -- --  02/15/20 1428 -- -- -- (!) 133 16 91 % -- --  02/15/20 1427 -- -- -- (!) 127 20 94 % -- --  02/15/20 1426 -- -- -- 89 (!) 25 93 % -- --  02/15/20 1425 -- -- -- (!) 138 (!) 24 95 % -- --  02/15/20 1424 -- -- -- (!) 123 20 93 % -- --  02/15/20 1423 -- -- -- (!) 145 20 94 % -- --  02/15/20 1420 104/73 -- -- -- -- -- -- --  02/15/20 1358 (!) 153/123 97.7 F (36.5 C) Oral (!) 151 (!) 22 95 % 5\' 2"  (1.575 m) 52.2 kg     Medical Decision Making:  This patient is presenting for evaluation of tachycardia, which does require a range of treatment options, and is a complaint that involves a high risk of morbidity and mortality. The differential diagnoses include acute coronary syndrome, metabolic disorder, cardiac arrhythmia. I decided to review old records, and in summary [place] female with history of atrial fibrillation, currently taking anticoagulants, presenting with prolonged tachycardia for 1 week.  I did not require additional historical information from anyone.  Clinical Laboratory Tests Ordered, included CBC, Metabolic panel and Urinalysis. Review indicates reassuring labs with mild anemia. Radiologic Tests Ordered, included chest x-ray.  I independently Visualized: Radiographic images, which show no infiltrate or edema   Critical Interventions-clinical evaluation, cardiac monitoring, laboratory testing, radiography, high-volume saline bolus, observation reassessment.  She did not respond to fluids and Cardizem was ordered.  After These Interventions, the Patient was reevaluated and was found with persistent tachycardia, atrial fibrillation with RVR.  Patient requires hospitalization for stabilization.  CRITICAL CARE-yes Performed by: Daleen Bo  Nursing Notes Reviewed/ Care Coordinated Applicable Imaging Reviewed Interpretation of Laboratory Data incorporated into ED treatment   8:51 PM-Consult complete with hospitalist. Patient case explained and discussed.  He agrees to admit patient for further evaluation and treatment. Call ended at 901.   Final Clinical Impression(s) / ED Diagnoses Final diagnoses:  Atrial fibrillation with RVR [facility])    Rx / DC Orders ED Discharge Orders    None       Daleen Bo, MD 02/19/20 [phone]

## 2020-02-15 NOTE — Telephone Encounter (Signed)
Spoke with patient and she confirmed that she felt like she's been in Afib for a week or more. She said her heart rate stays between 130-150 bpm. She has no complaints of SOB, CP, or tightness. After speaking with another nurse, advised patient that she may benefit from being checked out in the ED. She will call with any other questions or concerns.

## 2020-02-15 NOTE — ED Provider Notes (Signed)
MSE was initiated and I personally evaluated the patient and placed orders (if any) at  2:57 PM on February 15, 2020.  The patient appears stable so that the remainder of the MSE may be completed by another provider.  History of A. fib on apixaban no missed doses.  Complaining of increased heart rate over a week.  Causes somewhat increased shortness of breath from baseline.  Here tachycardic 1 40-1 60.  Satting well on room air.  No distress.  Will initiate some lab work   Hayden Rasmussen, MD 02/15/20 2010

## 2020-02-15 NOTE — Telephone Encounter (Signed)
New message    STAT if HR is under 50 or over 120 (normal HR is 60-100 beats per minute)  1) What is your heart rate? 130-150 for about a week   2) Do you have a log of your heart rate readings (document readings)?  no  3) Do you have any other symptoms? Has been in afib and she was not told what to do when this happens

## 2020-02-15 NOTE — ED Notes (Signed)
Admitting MD at the bedside.  

## 2020-02-15 NOTE — H&P (Signed)
TRH H&P   Patient Demographics:    [name], is a 87 y.o. female  MRN: [id number]   DOB - 03/23/1933  Admit Date - 02/15/2020  Outpatient Primary MD for the patient is Sharilyn Sites, MD  Referring MD/NP/PA: Dr Eulis Foster  Patient coming from: Home  Chief Complaint  Patient presents with  . Atrial Fibrillation      HPI:    [name]  is a 87 y.o. female, with past medical history relevant for chronic atrial fibrillation, HTN,,HLD, rheumatoid arthritis, CVA, L1/L2 fracture status post kyphoplasty, chronic lower back pain.  Patient with recent hospitalization last September for A. fib with RVR, difficult to control, required cardioversion, patient presents to ED secondary to palpitation, patient report uncontrolled heart rate, rapid heartbeat and palpitation for last week, she denies any fever, chills, vomiting, dizziness or weakness, patient recently on prednisone taper for her rheumatoid arthritis, patient report her dyspnea at baseline, reports she is vaccinated against Covid. -In ED heart rate was noted to be elevated in the 150s initially, she was received IV fluid, without much improvement of heart rate, so she was started on Cardizem drip, and [facility] hospitalist consulted to admit.   Review of systems:    In addition to the HPI above, No Fever-chills No Headache, No changes with Vision or hearing, No problems swallowing food or Liquids, No Chest pain, Cough, she does report dyspnea at baseline, she reports palpitation No Abdominal pain, No Nausea or Vommitting, Bowel movements are regular, No Blood in stool or Urine, No dysuria, No new skin rashes or bruises, No new joints pains-aches,  No new weakness, tingling, numbness in any extremity, No recent weight gain or loss, No polyuria, polydypsia or polyphagia, No significant Mental Stressors.  A full 10 point  Review of Systems was done, except as stated above, all other Review of Systems were negative.   With Past History of the following :    Past Medical History:  Diagnosis Date  . A-fib ([place])   . Atrial fibrillation ([place])   . Cervicalgia   . Colon cancer ([place])   . Degeneration of cervical intervertebral disc   . Hypertension   . Pinched nerve in shoulder   . Rheumatoid arthritis(714.0)   . RUQ pain   . Vulvar dystrophy   . Yeast infection       Past Surgical History:  Procedure Laterality Date  . ABDOMINAL HYSTERECTOMY  1960'S  . APPENDECTOMY  with hysterectomy  . BACK SURGERY  12/08/10   plate/screws C3 C4  . CARDIOVERSION N/A 09/29/2019   Procedure: CARDIOVERSION;  Surgeon: Arnoldo Lenis, MD;  [facility];  Service: Endoscopy;  Laterality: N/A;  pt ate early this AM per Dr. Harl Bowie  . CHOLECYSTECTOMY  2002  . COLONOSCOPY  04/02/2009  . COLONOSCOPY  06/04/05  . COLONOSCOPY N/A 07/26/2015   Procedure: COLONOSCOPY;  Surgeon: Bernadene Person  Gloriann Loan, MD;  Location: [facility];  Service: Endoscopy;  Laterality: N/A;  1240  . COLONOSCOPY WITH ESOPHAGOGASTRODUODENOSCOPY (EGD) N/A 04/08/2012   Procedure: COLONOSCOPY WITH ESOPHAGOGASTRODUODENOSCOPY (EGD);  Surgeon: Rogene Houston, MD;  [facility];  Service: Endoscopy;  Laterality: N/A;  130-moved to 245 Ann notified pt  . ESOPHAGOGASTRODUODENOSCOPY N/A 07/26/2015   Procedure: ESOPHAGOGASTRODUODENOSCOPY (EGD);  Surgeon: Rogene Houston, MD;  Location: [facility];  Service: Endoscopy;  Laterality: N/A;  . HEMICOLECTOMY  10/05      Social History:     Social History   Tobacco Use  . Smoking status: Never Smoker  . Smokeless tobacco: Never Used  Substance Use Topics  . Alcohol use: No      Family History :     Family History  Problem Relation Age of Onset  . Rheum arthritis Mother   . Parkinsonism Father   . Hiatal hernia Sister   . Healthy Brother   . Healthy Daughter   . Healthy Son   .  Healthy Son    6   Home Medications:   Prior to Admission medications   Medication Sig Start Date End Date Taking? Authorizing Provider  apixaban (ELIQUIS) 2.5 MG TABS tablet Take 1 tablet (2.5 mg total) by mouth 2 (two) times daily. 09/08/18  Yes Gerlene Fee, NP  cholecalciferol (VITAMIN D3) 25 MCG (1000 UT) tablet Take 2,000 Units by mouth 2 (two) times daily.    Yes [provider]  HYDROcodone-acetaminophen (NORCO) 7.5-325 MG tablet Take 1 tablet by mouth 3 (three) times daily as needed for moderate pain. 09/08/18  Yes Gerlene Fee, NP  leflunomide (ARAVA) 20 MG tablet Take 1 tablet (20 mg total) by mouth daily. 09/08/18  Yes Gerlene Fee, NP  metoprolol succinate (TOPROL-XL) 100 MG 24 hr tablet Take 100 mg by mouth daily. 10/19/19  Yes [provider]  Omega-3 Fatty Acids (FISH OIL) 1000 MG CAPS Take 1 capsule by mouth daily.   Yes [provider]  omeprazole (PRILOSEC) 20 MG capsule TAKE 1 CAPSULE BY MOUTH TWICE DAILY Patient taking differently: Take 20 mg by mouth in the morning and at bedtime. 07/25/19  Yes Laurine Blazer B, PA-C  Polyethyl Glycol-Propyl Glycol (SYSTANE) 0.4-0.3 % SOLN Apply 2 drops to eye daily as needed (dry eye).   Yes [provider]  predniSONE (DELTASONE) 10 MG tablet Take 10 mg by mouth See admin instructions. Take 5 tablets by mouth for 3 days, 4 tabs for 3 days, 3 tabs for 3 days, 2 tabs for 3 days and 1 tab for 3 days. 02/08/20  Yes [provider]  rosuvastatin (CRESTOR) 20 MG tablet Take 1 tablet (20 mg total) by mouth daily at 6 PM. 09/08/18  Yes Green, Phylis Bougie, NP  meclizine (ANTIVERT) 12.5 MG tablet Take 1 tablet (12.5 mg total) by mouth 2 (two) times daily as needed for dizziness. Patient not taking: Reported on 02/15/2020 09/08/18   Gerlene Fee, NP     Allergies:     Allergies  Allergen Reactions  . Iodinated Diagnostic Agents Anaphylaxis    08-12-10 Pt had severe reaction with  itching/hives/loss of consciousness within 5 minutes of CEPI today.  She received both Kenalog and Iondinated Contrast, so not certain which agent caused reaction.  If patient returns for another CEPI, Dr. Jobe Igo considers using Decadron and a 13-hour prep.  jkl  . Nerve Block Tray Anaphylaxis    Patient states that she  was sent to have Epidural for her shoulder. After rec'ving she remembers saying that she was itching all over  And that's it. She was transported to hospital  By EMS. Stayed overnight.  [place] Spikes Derivatives Hives and Nausea And Vomiting  . Triamcinolone Acetonide Anaphylaxis    08-12-10  Pt had severe reaction with itching/hives/loss of consciousness within five minutes of CEPI today.  She received both Kenalog and Iodinated Contrast, so not certain which agent caused reaction.  If pt returns for another cervical epidural steroid injection, Dr. Jobe Igo suggests using Decadron and a 13-hr prep.  JKL PATIENT USES PROCTOCREAM 2.5% WITHOUT DIFFICULTY FOR VULVAR DYSTROPHY Patient says she can take prednisone without any problem  . Vibra-Tab [Doxycycline] Nausea And Vomiting     Physical Exam:   Vitals  Blood pressure 125/60, pulse (!) 118, temperature 97.7 F (36.5 C), temperature source Oral, resp. rate (!) 25, height 5\' 2"  (1.575 m), weight 52.2 kg, SpO2 (!) 89 %.   1. General frail elderly female, laying in bed, no apparent distress  2. Normal affect and insight, Not Suicidal or Homicidal, Awake Alert, Oriented X 3.  3. No F.N deficits, ALL C.Nerves Intact, Strength 5/5 all 4 extremities, Sensation intact all 4 extremities, Plantars down going.  4. Ears and Eyes appear Normal, Conjunctivae clear, PERRLA. Moist Oral Mucosa.  5. Supple Neck, No JVD, No cervical lymphadenopathy appriciated, No Carotid Bruits.  6. Symmetrical Chest wall movement, Good air movement bilaterally, coarse respiratory sounds bilaterally, she is dyspneic sometimes unable to finish full  sentences.  7.  Irregular regular, tachycardic, No Gallops, Rubs or Murmurs, No Parasternal Heave.  8. Positive Bowel Sounds, Abdomen Soft, No tenderness, No organomegaly appriciated,No rebound -guarding or rigidity.  9.  No Cyanosis, Normal Skin Turgor, No Skin Rash or Bruise.  10. Good muscle tone,  joints appear normal , no effusions, Normal ROM.  11. No Palpable Lymph Nodes in Neck or Axillae    Data Review:    CBC Recent Labs  Lab 02/15/20 1435  WBC 8.4  HGB 11.0*  HCT 35.7*  PLT 189  MCV 98.6  MCH 30.4  MCHC 30.8  RDW 18.2*   ------------------------------------------------------------------------------------------------------------------  Chemistries  Recent Labs  Lab 02/15/20 1435  NA 142  K 3.5  CL 105  CO2 28  GLUCOSE 111*  BUN 33*  CREATININE 0.69  CALCIUM 7.6*  MG 1.9   ------------------------------------------------------------------------------------------------------------------ estimated creatinine clearance is 41.4 mL/min (by C-G formula based on SCr of 0.69 mg/dL). ------------------------------------------------------------------------------------------------------------------ Recent Labs    02/15/20 1435  TSH 0.385    Coagulation profile No results for input(s): INR, PROTIME in the last 168 hours. ------------------------------------------------------------------------------------------------------------------- No results for input(s): DDIMER in the last 72 hours. -------------------------------------------------------------------------------------------------------------------  Cardiac Enzymes No results for input(s): CKMB, TROPONINI, MYOGLOBIN in the last 168 hours.  Invalid input(s): CK ------------------------------------------------------------------------------------------------------------------    Component Value Date/Time   BNP 298.0 (H) 08/22/2018 1013      ---------------------------------------------------------------------------------------------------------------  Urinalysis    Component Value Date/Time   COLORURINE STRAW (A) 10/18/2019 1121   APPEARANCEUR CLEAR 10/18/2019 1121   LABSPEC 1.004 (L) 10/18/2019 1121   PHURINE 7.0 10/18/2019 1121   GLUCOSEU NEGATIVE 10/18/2019 1121   HGBUR NEGATIVE 10/18/2019 1121   BILIRUBINUR NEGATIVE 10/18/2019 1121   KETONESUR NEGATIVE 10/18/2019 1121   PROTEINUR NEGATIVE 10/18/2019 1121   UROBILINOGEN 0.2 04/23/2014 1300   NITRITE NEGATIVE 10/18/2019 1121   LEUKOCYTESUR NEGATIVE 10/18/2019 1121    ----------------------------------------------------------------------------------------------------------------   Imaging Results:  DG Chest Port 1 View  Result Date: 02/15/2020 CLINICAL DATA:  Dyspnea, arrhythmia EXAM: PORTABLE CHEST 1 VIEW COMPARISON:  09/28/2019 chest radiograph. FINDINGS: Stable cardiomediastinal silhouette with normal heart size. No pneumothorax. No pleural effusion. Stable mild elevation of the right hemidiaphragm. No pulmonary edema. No acute consolidative airspace disease. IMPRESSION: No active disease. Electronically Signed   By: Ilona Sorrel M.D.   On: 02/15/2020 15:46    My personal review of EKG: RA Fib with RVR, Rate  140/min, QTc 489, no Acute ST changes   Assessment & Plan:    Active Problems:   HLD (hyperlipidemia)   Essential hypertension   CVA (cerebral vascular accident) Acute subcentimeter infarction in the right [place]   Rheumatoid arthritis (HCC)   ILD (interstitial lung disease) (HCC)   Atrial fibrillation with rapid ventricular response (HCC)   Atrial fibrillation with RVR ([facility])     A. fib with RVR -Patient presents with heart rate in the 140s to 150s, no improvement with IV fluids, she is currently on Cardizem drip with some improvement of her heart rate . -During recent admission she had difficult to control heart rate where she  required cardioversion, for now I will resume on metoprolol 50 mg oral twice daily, continue with Cardizem drip . -Cardiology consult requested in epic , given his known history for difficult to control heart rate and may need cardioversion . -TSH within normal limit -Continue with Eliquis for anticoagulation.  Interstitial lung disease -It was thought to be due to rheumatoid arthritis -She is followed by pulmonary as an outpatient -She is currently on prednisone taper -She is significantly short of breath when talking, but she is not hypoxic  Rheumatoid arthritis -She is been followed with rheumatology -Continue with [place] -She is on prednisone taper, currently on 30 mg oral daily supposed to go on 20 mg oral tomorrow, I will keep her on current dose given she is dyspneic  Hypertension -She is on Toprol-XL, I will keep on metoprolol 50 mg twice daily, she is on Cardizem drip as well  History of CVA -On aspirin, Eliquis and statin.  Aortic regurg -Mild to moderate in severity and echogram in September 2020     DVT Prophylaxis Eliquis  AM Labs Ordered, also please review Full Orders  Family Communication: Admission, patients condition and plan of care including tests being ordered have been discussed with the patient and son at bedside who indicate understanding and agree with the plan and Code Status.  Code Status DNR  Likely DC to  home  Condition GUARDED    Consults called:   Cardiology requested in EPIC  Admission status:   observation  Time spent in minutes : 65 minutes   Phillips Climes M.D on 02/15/2020 at 9:25 PM   [facility] - Office  [phone]

## 2020-02-15 NOTE — ED Triage Notes (Signed)
afib acting up for a week

## 2020-02-15 NOTE — H&P (View-Only) (Signed)
[facility] H&P   Patient Demographics:    [name], is a 87 y.o. female  MRN: [id number]   DOB - [birth year]  Admit Date - 02/15/2020  Outpatient Primary MD for the patient is Sharilyn Sites, MD  Referring MD/NP/PA: Dr Eulis Foster  Patient coming from: Home  Chief Complaint  Patient presents with  . Atrial Fibrillation      HPI:    [name]  is a 87 y.o. female, with past medical history relevant for chronic atrial fibrillation, HTN,,HLD, rheumatoid arthritis, CVA, L1/L2 fracture status post kyphoplasty, chronic lower back pain.  Patient with recent hospitalization last September for A. fib with RVR, difficult to control, required cardioversion, patient presents to ED secondary to palpitation, patient report uncontrolled heart rate, rapid heartbeat and palpitation for last week, she denies any fever, chills, vomiting, dizziness or weakness, patient recently on prednisone taper for her rheumatoid arthritis, patient report her dyspnea at baseline, reports she is vaccinated against Covid. -In ED heart rate was noted to be elevated in the 150s initially, she was received IV fluid, without much improvement of heart rate, so she was started on Cardizem drip, and [facility] hospitalist consulted to admit.   Review of systems:    In addition to the HPI above, No Fever-chills No Headache, No changes with Vision or hearing, No problems swallowing food or Liquids, No Chest pain, Cough, she does report dyspnea at baseline, she reports palpitation No Abdominal pain, No Nausea or Vommitting, Bowel movements are regular, No Blood in stool or Urine, No dysuria, No new skin rashes or bruises, No new joints pains-aches,  No new weakness, tingling, numbness in any extremity, No recent weight gain or loss, No polyuria, polydypsia or polyphagia, No significant Mental Stressors.  A full 10 point  Review of Systems was done, except as stated above, all other Review of Systems were negative.   With Past History of the following :    Past Medical History:  Diagnosis Date  . A-fib ([place])   . Atrial fibrillation ([place])   . Cervicalgia   . Colon cancer ([place])   . Degeneration of cervical intervertebral disc   . Hypertension   . Pinched nerve in shoulder   . Rheumatoid arthritis(714.0)   . RUQ pain   . Vulvar dystrophy   . Yeast infection       Past Surgical History:  Procedure Laterality Date  . ABDOMINAL HYSTERECTOMY  1960'S  . APPENDECTOMY  with hysterectomy  . BACK SURGERY  12/08/10   plate/screws C3 C4  . CARDIOVERSION N/A 09/29/2019   Procedure: CARDIOVERSION;  Surgeon: Arnoldo Lenis, MD;  [facility];  Service: Endoscopy;  Laterality: N/A;  pt ate early this AM per Dr. Harl Bowie  . CHOLECYSTECTOMY  2002  . COLONOSCOPY  04/02/2009  . COLONOSCOPY  06/04/05  . COLONOSCOPY N/A 07/26/2015   Procedure: COLONOSCOPY;  Surgeon: Bernadene Person  Gloriann Loan, MD;  Location: [facility];  Service: Endoscopy;  Laterality: N/A;  1240  . COLONOSCOPY WITH ESOPHAGOGASTRODUODENOSCOPY (EGD) N/A 04/08/2012   Procedure: COLONOSCOPY WITH ESOPHAGOGASTRODUODENOSCOPY (EGD);  Surgeon: Rogene Houston, MD;  [facility];  Service: Endoscopy;  Laterality: N/A;  130-moved to 245 Ann notified pt  . ESOPHAGOGASTRODUODENOSCOPY N/A 07/26/2015   Procedure: ESOPHAGOGASTRODUODENOSCOPY (EGD);  Surgeon: Rogene Houston, MD;  [facility];  Service: Endoscopy;  Laterality: N/A;  . HEMICOLECTOMY  10/05      Social History:     Social History   Tobacco Use  . Smoking status: Never Smoker  . Smokeless tobacco: Never Used  Substance Use Topics  . Alcohol use: No      Family History :     Family History  Problem Relation Age of Onset  . Rheum arthritis Mother   . Parkinsonism Father   . Hiatal hernia Sister   . Healthy Brother   . Healthy Daughter   . Healthy Son   .  Healthy Son    6   Home Medications:   Prior to Admission medications   Medication Sig Start Date End Date Taking? Authorizing Provider  apixaban (ELIQUIS) 2.5 MG TABS tablet Take 1 tablet (2.5 mg total) by mouth 2 (two) times daily. 09/08/18  Yes Gerlene Fee, NP  cholecalciferol (VITAMIN D3) 25 MCG (1000 UT) tablet Take 2,000 Units by mouth 2 (two) times daily.    Yes [provider]  HYDROcodone-acetaminophen (NORCO) 7.5-325 MG tablet Take 1 tablet by mouth 3 (three) times daily as needed for moderate pain. 09/08/18  Yes Gerlene Fee, NP  leflunomide (ARAVA) 20 MG tablet Take 1 tablet (20 mg total) by mouth daily. 09/08/18  Yes Gerlene Fee, NP  metoprolol succinate (TOPROL-XL) 100 MG 24 hr tablet Take 100 mg by mouth daily. 10/19/19  Yes [provider]  Omega-3 Fatty Acids (FISH OIL) 1000 MG CAPS Take 1 capsule by mouth daily.   Yes [provider]  omeprazole (PRILOSEC) 20 MG capsule TAKE 1 CAPSULE BY MOUTH TWICE DAILY Patient taking differently: Take 20 mg by mouth in the morning and at bedtime. 07/25/19  Yes Laurine Blazer B, PA-C  Polyethyl Glycol-Propyl Glycol (SYSTANE) 0.4-0.3 % SOLN Apply 2 drops to eye daily as needed (dry eye).   Yes [provider]  predniSONE (DELTASONE) 10 MG tablet Take 10 mg by mouth See admin instructions. Take 5 tablets by mouth for 3 days, 4 tabs for 3 days, 3 tabs for 3 days, 2 tabs for 3 days and 1 tab for 3 days. 02/08/20  Yes [provider]  rosuvastatin (CRESTOR) 20 MG tablet Take 1 tablet (20 mg total) by mouth daily at 6 PM. 09/08/18  Yes Green, Phylis Bougie, NP  meclizine (ANTIVERT) 12.5 MG tablet Take 1 tablet (12.5 mg total) by mouth 2 (two) times daily as needed for dizziness. Patient not taking: Reported on 02/15/2020 09/08/18   Gerlene Fee, NP     Allergies:     Allergies  Allergen Reactions  . Iodinated Diagnostic Agents Anaphylaxis    08-12-10 Pt had severe reaction with  itching/hives/loss of consciousness within 5 minutes of CEPI today.  She received both Kenalog and Iondinated Contrast, so not certain which agent caused reaction.  If patient returns for another CEPI, Dr. Jobe Igo considers using Decadron and a 13-hour prep.  jkl  . Nerve Block Tray Anaphylaxis    Patient states that she  was sent to have Epidural for her shoulder. After rec'ving she remembers saying that she was itching all over  And that's it. She was transported to hospital  By EMS. Stayed overnight.  [place] Spikes Derivatives Hives and Nausea And Vomiting  . Triamcinolone Acetonide Anaphylaxis    08-12-10  Pt had severe reaction with itching/hives/loss of consciousness within five minutes of CEPI today.  She received both Kenalog and Iodinated Contrast, so not certain which agent caused reaction.  If pt returns for another cervical epidural steroid injection, Dr. Jobe Igo suggests using Decadron and a 13-hr prep.  JKL PATIENT USES PROCTOCREAM 2.5% WITHOUT DIFFICULTY FOR VULVAR DYSTROPHY Patient says she can take prednisone without any problem  . Vibra-Tab [Doxycycline] Nausea And Vomiting     Physical Exam:   Vitals  Blood pressure 125/60, pulse (!) 118, temperature 97.7 F (36.5 C), temperature source Oral, resp. rate (!) 25, height 5\' 2"  (1.575 m), weight 52.2 kg, SpO2 (!) 89 %.   1. General frail elderly female, laying in bed, no apparent distress  2. Normal affect and insight, Not Suicidal or Homicidal, Awake Alert, Oriented X 3.  3. No F.N deficits, ALL C.Nerves Intact, Strength 5/5 all 4 extremities, Sensation intact all 4 extremities, Plantars down going.  4. Ears and Eyes appear Normal, Conjunctivae clear, PERRLA. Moist Oral Mucosa.  5. Supple Neck, No JVD, No cervical lymphadenopathy appriciated, No Carotid Bruits.  6. Symmetrical Chest wall movement, Good air movement bilaterally, coarse respiratory sounds bilaterally, she is dyspneic sometimes unable to finish full  sentences.  7.  Irregular regular, tachycardic, No Gallops, Rubs or Murmurs, No Parasternal Heave.  8. Positive Bowel Sounds, Abdomen Soft, No tenderness, No organomegaly appriciated,No rebound -guarding or rigidity.  9.  No Cyanosis, Normal Skin Turgor, No Skin Rash or Bruise.  10. Good muscle tone,  joints appear normal , no effusions, Normal ROM.  11. No Palpable Lymph Nodes in Neck or Axillae    Data Review:    CBC Recent Labs  Lab 02/15/20 1435  WBC 8.4  HGB 11.0*  HCT 35.7*  PLT 189  MCV 98.6  MCH 30.4  MCHC 30.8  RDW 18.2*   ------------------------------------------------------------------------------------------------------------------  Chemistries  Recent Labs  Lab 02/15/20 1435  NA 142  K 3.5  CL 105  CO2 28  GLUCOSE 111*  BUN 33*  CREATININE 0.69  CALCIUM 7.6*  MG 1.9   ------------------------------------------------------------------------------------------------------------------ estimated creatinine clearance is 41.4 mL/min (by C-G formula based on SCr of 0.69 mg/dL). ------------------------------------------------------------------------------------------------------------------ Recent Labs    02/15/20 1435  TSH 0.385    Coagulation profile No results for input(s): INR, PROTIME in the last 168 hours. ------------------------------------------------------------------------------------------------------------------- No results for input(s): DDIMER in the last 72 hours. -------------------------------------------------------------------------------------------------------------------  Cardiac Enzymes No results for input(s): CKMB, TROPONINI, MYOGLOBIN in the last 168 hours.  Invalid input(s): CK ------------------------------------------------------------------------------------------------------------------    Component Value Date/Time   BNP 298.0 (H) 08/22/2018 1013      ---------------------------------------------------------------------------------------------------------------  Urinalysis    Component Value Date/Time   COLORURINE STRAW (A) 10/18/2019 1121   APPEARANCEUR CLEAR 10/18/2019 1121   LABSPEC 1.004 (L) 10/18/2019 1121   PHURINE 7.0 10/18/2019 1121   GLUCOSEU NEGATIVE 10/18/2019 1121   HGBUR NEGATIVE 10/18/2019 1121   BILIRUBINUR NEGATIVE 10/18/2019 1121   KETONESUR NEGATIVE 10/18/2019 1121   PROTEINUR NEGATIVE 10/18/2019 1121   UROBILINOGEN 0.2 04/23/2014 1300   NITRITE NEGATIVE 10/18/2019 1121   LEUKOCYTESUR NEGATIVE 10/18/2019 1121    ----------------------------------------------------------------------------------------------------------------   Imaging Results:  DG Chest Port 1 View  Result Date: 02/15/2020 CLINICAL DATA:  Dyspnea, arrhythmia EXAM: PORTABLE CHEST 1 VIEW COMPARISON:  09/28/2019 chest radiograph. FINDINGS: Stable cardiomediastinal silhouette with normal heart size. No pneumothorax. No pleural effusion. Stable mild elevation of the right hemidiaphragm. No pulmonary edema. No acute consolidative airspace disease. IMPRESSION: No active disease. Electronically Signed   By: Ilona Sorrel M.D.   On: 02/15/2020 15:46    My personal review of EKG: RA Fib with RVR, Rate  140/min, QTc 489, no Acute ST changes   Assessment & Plan:    Active Problems:   HLD (hyperlipidemia)   Essential hypertension   CVA (cerebral vascular accident) Acute subcentimeter infarction in the right [place]   Rheumatoid arthritis (HCC)   ILD (interstitial lung disease) (HCC)   Atrial fibrillation with rapid ventricular response (HCC)   Atrial fibrillation with RVR (HCC)     A. fib with RVR -Patient presents with heart rate in the 140s to 150s, no improvement with IV fluids, she is currently on Cardizem drip with some improvement of her heart rate . -During recent admission she had difficult to control heart rate where she  required cardioversion, for now I will resume on metoprolol 50 mg oral twice daily, continue with Cardizem drip . -Cardiology consult requested in epic , given his known history for difficult to control heart rate and may need cardioversion . -TSH within normal limit -Continue with Eliquis for anticoagulation.  Interstitial lung disease -It was thought to be due to rheumatoid arthritis -She is followed by pulmonary as an outpatient -She is currently on prednisone taper -She is significantly short of breath when talking, but she is not hypoxic  Rheumatoid arthritis -She is been followed with rheumatology -Continue with [place] -She is on prednisone taper, currently on 30 mg oral daily supposed to go on 20 mg oral tomorrow, I will keep her on current dose given she is dyspneic  Hypertension -She is on Toprol-XL, I will keep on metoprolol 50 mg twice daily, she is on Cardizem drip as well  History of CVA -On aspirin, Eliquis and statin.  Aortic regurg -Mild to moderate in severity and echogram in September 2020     DVT Prophylaxis Eliquis  AM Labs Ordered, also please review Full Orders  Family Communication: Admission, patients condition and plan of care including tests being ordered have been discussed with the patient and son at bedside who indicate understanding and agree with the plan and Code Status.  Code Status DNR  Likely DC to  home  Condition GUARDED    Consults called:   Cardiology requested in EPIC  Admission status:   observation  Time spent in minutes : 65 minutes   Phillips Climes M.D on 02/15/2020 at 9:25 PM   [facility] - Office  [phone]

## 2020-02-16 ENCOUNTER — Encounter (HOSPITAL_COMMUNITY): Payer: Self-pay | Admitting: Family Medicine

## 2020-02-16 ENCOUNTER — Inpatient Hospital Stay (HOSPITAL_COMMUNITY): Payer: Medicare Other

## 2020-02-16 DIAGNOSIS — J42 Unspecified chronic bronchitis: Secondary | ICD-10-CM

## 2020-02-16 DIAGNOSIS — M069 Rheumatoid arthritis, unspecified: Secondary | ICD-10-CM

## 2020-02-16 DIAGNOSIS — R5381 Other malaise: Secondary | ICD-10-CM | POA: Diagnosis not present

## 2020-02-16 DIAGNOSIS — J849 Interstitial pulmonary disease, unspecified: Secondary | ICD-10-CM

## 2020-02-16 DIAGNOSIS — E782 Mixed hyperlipidemia: Secondary | ICD-10-CM | POA: Diagnosis not present

## 2020-02-16 DIAGNOSIS — J811 Chronic pulmonary edema: Secondary | ICD-10-CM | POA: Diagnosis not present

## 2020-02-16 DIAGNOSIS — I10 Essential (primary) hypertension: Secondary | ICD-10-CM | POA: Diagnosis not present

## 2020-02-16 DIAGNOSIS — I4891 Unspecified atrial fibrillation: Secondary | ICD-10-CM | POA: Diagnosis not present

## 2020-02-16 LAB — BASIC METABOLIC PANEL
Anion gap: 8 (ref 5–15)
BUN: 23 mg/dL (ref 8–23)
CO2: 28 mmol/L (ref 22–32)
Calcium: 5.7 mg/dL — CL (ref 8.9–10.3)
Chloride: 105 mmol/L (ref 98–111)
Creatinine, Ser: 0.6 mg/dL (ref 0.44–1.00)
GFR, Estimated: >60 mL/min (ref >60)
Glucose, Bld: 99 mg/dL (ref 70–99)
Potassium: 4.3 mmol/L (ref 3.5–5.1)
Sodium: 141 mmol/L (ref 135–145)

## 2020-02-16 LAB — ALBUMIN: Albumin: 2.8 g/dL — ABNORMAL LOW (ref 3.5–5.0)

## 2020-02-16 LAB — BRAIN NATRIURETIC PEPTIDE: B Natriuretic Peptide: 328 pg/mL — ABNORMAL HIGH (ref 0.0–100.0)

## 2020-02-16 MED ORDER — OMEPRAZOLE 20 MG PO CPDR: 20.0000 mg | DELAYED_RELEASE_CAPSULE | Freq: Two times a day (BID) | ORAL | Status: DC

## 2020-02-16 MED ORDER — PROPOFOL 10 MG/ML IV BOLUS
0.6000 mg/kg | Freq: Once | INTRAVENOUS | Status: AC
  Administered 2020-02-16: 31.3 mg via INTRAVENOUS
  Filled 2020-02-16: qty 20

## 2020-02-16 MED ORDER — CALCIUM GLUCONATE-NACL 1-0.675 GM/50ML-% IV SOLN
1.0000 g | INTRAVENOUS | Status: AC
  Administered 2020-02-16 (×2): 1000 mg via INTRAVENOUS
  Filled 2020-02-16 (×2): qty 50

## 2020-02-16 MED ORDER — FUROSEMIDE 10 MG/ML IJ SOLN
40.0000 mg | Freq: Once | INTRAMUSCULAR | Status: AC
  Administered 2020-02-16: 40 mg via INTRAVENOUS
  Filled 2020-02-16: qty 4

## 2020-02-16 MED ORDER — CALCIUM GLUCONATE-NACL 2-0.675 GM/100ML-% IV SOLN: 2.0000 g | Freq: Once | INTRAVENOUS | Status: DC

## 2020-02-16 NOTE — ED Notes (Signed)
Pt resting in bed at this time with no issues. Still in NSR. Pts son is at bedside. VSS. Bed lowered and locked. Side rails up x2. Will continue monitor patient.

## 2020-02-16 NOTE — Sedation Documentation (Signed)
Pt sitting up in bed communicating needs with staff. Pt denies chest pain / sob. Pt is A/O x4. Pt denies pain. Respiratory at bedside placing pt on salter HIGH flow.

## 2020-02-16 NOTE — ED Notes (Addendum)
CRITICAL VALUE ALERT  Critical Value:  Calcium 5.7  Date & Time Notied: 0712 02/16/20  Provider Notified: Wynetta Emery  Orders Received/Actions taken:

## 2020-02-16 NOTE — ED Notes (Signed)
Cardiology PA at bedside. 

## 2020-02-16 NOTE — Care Management Obs Status (Signed)
[place] NOTIFICATION   Patient Details  Name: [name] MRN: [id number] Date of Birth: [birth year]   Medicare Observation Status Notification Given:  Yes    Natasha Bence, LCSW 02/16/2020, 3:33 PM

## 2020-02-16 NOTE — ED Notes (Signed)
MD at bedside to discuss cardioversion with patient.

## 2020-02-16 NOTE — ED Notes (Signed)
Pt's sleeping

## 2020-02-16 NOTE — ED Notes (Signed)
assesment done by this RN. Pt HR remains in afib w/ rates 110-120. BP stable @ 142/84. Cardizem drip increased by 2.5 per MD orders. Pt now @ 10mg /h. Pt denies chest pain / sob. Pt resting in bed with no complaints. Bed lowered and locked. Call bell in reach. Will continue to monitor patient.

## 2020-02-16 NOTE — ED Notes (Signed)
SATURATION QUALIFICATIONS: (This note is used to comply with regulatory documentation for home oxygen)  Patient Saturations on Room Air at Rest = 85%  Patient Saturations on Room Air while Ambulating = %  Patient Saturations on  Liters of oxygen while Ambulating =%  Please briefly explain why patient needs home oxygen: pts o2 at rest is 85%. Pt is unable to ambulate at this time. Pt sats are 95% when placed on 2L of O2.

## 2020-02-16 NOTE — Care Management CC44 (Signed)
Condition Code 44 Documentation Completed  Patient Details  Name: ORIA KLIMAS MRN: [id number] Date of Birth: May 25, 1933   Condition Code 44 given:  Yes Patient signature on Condition Code 44 notice:  Yes Documentation of 2 MD's agreement:  Yes Code 44 added to claim:  Yes    Natasha Bence, LCSW 02/16/2020, 3:33 PM

## 2020-02-16 NOTE — TOC Transition Note (Signed)
Transition of Care [facility]) - CM/SW Discharge Note   Patient Details  Name: WAFA MARTES MRN: [id number] Date of Birth: [birth year]  [facility] CM/SW Contact:  Natasha Bence, LCSW Phone Number: 02/16/2020, 3:35 PM   Clinical Narrative:    CSW received notification of patient's readiness for discharge and O2 requirements. CSW placed order for O2 with Barbaraann Rondo from [facility]. Barbaraann Rondo agreeable to provide O2 to patient. TOC signing off.    Final next level of care: Home/Self Care Barriers to Discharge: Barriers Resolved   Patient Goals and CMS Choice Patient states their goals for this hospitalization and ongoing recovery are:: Return home with O2 CMS Medicare.gov Compare Post Acute Care list provided to:: Patient Choice offered to / list presented to : [facility]  Discharge Placement                    Patient and family notified of of transfer: 02/16/20  Discharge Plan and Services                DME Arranged: Oxygen DME Agency: [employer] Date DME Agency Contacted: 02/16/20 Time DME Agency Contacted: [phone] Representative spoke with at DME Agency: Barbaraann Rondo HH Arranged: NA [place] Agency: NA        Social Determinants of Health ([place]) Interventions     Readmission Risk Interventions No flowsheet data found.

## 2020-02-16 NOTE — Discharge Summary (Signed)
Physician Discharge Summary  [name] [id number] DOB: Sep 12, 1933 DOA: 02/15/2020  PCP: Sharilyn Sites, MD  Admit date: 02/15/2020 Discharge date: 02/16/2020  Admitted From:  Home  Disposition:  Home   Recommendations for Outpatient Follow-up:  1. Follow up with PCP in 1 weeks 2. Establish with Afib clinic on 02/23/20 as scheduled  3. Outpatient palliative medicine referral was made  Discharge Condition: STABLE   CODE STATUS: FULL   Diet: Heart Healthy  Brief Hospitalization Summary: Please see all hospital notes, images, labs for full details of the hospitalization. HPI:  87 y.o. female, with past medical history relevant for chronic atrial fibrillation, HTN,,HLD, rheumatoid arthritis, CVA, L1/L2 fracture status post kyphoplasty, chronic lower back pain.  Patient with recent hospitalization last September for A. fib with RVR, difficult to control, required cardioversion, patient presents to ED secondary to palpitation, patient report uncontrolled heart rate, rapid heartbeat and palpitation for last week, she denies any fever, chills, vomiting, dizziness or weakness, patient recently on prednisone taper for her rheumatoid arthritis, patient report her dyspnea at baseline, reports she is vaccinated against Covid. -In ED heart rate was noted to be elevated in the 150s initially, she was received IV fluid, without much improvement of heart rate, so she was started on Cardizem drip, and [facility] consulted to admit.  Hospital Course Pt was admitted and initially placed on IV cardizem infusion but rates remained poorly controlled. Cardiology was consulted and decision was made to pursue cardioversion in ED. This was done by the ED physician Dr. Langston Masker.  The procedure was successful.  Pt was monitored for several more hours in ED and heart rate has remained stable and controlled.  She is anxious to get back home.  She has been referred to outpatient Afib clinic.  Her medications were  not changed.  She does qualify for home oxygen 2L/min and arrangements made for home oxygen.  She has a high 1 year mortality risk and I have made outpatient referral to palliative care.  Pt stable to discharge home with family at bedside.    Discharge Diagnoses:  Active Problems:   HLD (hyperlipidemia)   Essential hypertension   CVA (cerebral vascular accident) Acute subcentimeter infarction in the right [place]   Rheumatoid arthritis (HCC)   ILD (interstitial lung disease) ([place])   Chronic bronchitis (HCC)   Discharge Instructions: Discharge Instructions    Amb Referral to Palliative Care   Complete by: As directed      Allergies as of 02/16/2020      Reactions   Iodinated Diagnostic Agents Anaphylaxis   08-12-10 Pt had severe reaction with itching/hives/loss of consciousness within 5 minutes of CEPI today.  She received both Kenalog and Iondinated Contrast, so not certain which agent caused reaction.  If patient returns for another CEPI, Dr. Jobe Igo considers using Decadron and a 13-hour prep.  jkl   Nerve Block Tray Anaphylaxis   Patient states that she was sent to have Epidural for her shoulder. After rec'ving she remembers saying that she was itching all over  And that's it. She was transported to hospital  By EMS. Stayed overnight.   Nitrofuran Derivatives Hives, Nausea And Vomiting   Triamcinolone Acetonide Anaphylaxis   08-12-10  Pt had severe reaction with itching/hives/loss of consciousness within five minutes of CEPI today.  She received both Kenalog and Iodinated Contrast, so not certain which agent caused reaction.  If pt returns for another cervical epidural steroid injection, Dr. Jobe Igo suggests using Decadron and a  13-hr prep.  JKL PATIENT USES PROCTOCREAM 2.5% WITHOUT DIFFICULTY FOR VULVAR DYSTROPHY Patient says she can take prednisone without any problem   Vibra-tab [doxycycline] Nausea And Vomiting      Medication List    STOP taking these medications    meclizine 12.5 MG tablet Commonly known as: ANTIVERT     TAKE these medications   apixaban 2.5 MG Tabs tablet Commonly known as: ELIQUIS Take 1 tablet (2.5 mg total) by mouth 2 (two) times daily.   cholecalciferol 25 MCG (1000 UNIT) tablet Commonly known as: VITAMIN D3 Take 2,000 Units by mouth 2 (two) times daily.   Fish Oil 1000 MG Caps Take 1 capsule by mouth daily.   HYDROcodone-acetaminophen 7.5-325 MG tablet Commonly known as: NORCO Take 1 tablet by mouth 3 (three) times daily as needed for moderate pain.   leflunomide 20 MG tablet Commonly known as: ARAVA Take 1 tablet (20 mg total) by mouth daily.   metoprolol succinate 100 MG 24 hr tablet Commonly known as: TOPROL-XL Take 100 mg by mouth daily.   omeprazole 20 MG capsule Commonly known as: PRILOSEC Take 1 capsule (20 mg total) by mouth in the morning and at bedtime.   predniSONE 10 MG tablet Commonly known as: DELTASONE Take 10 mg by mouth See admin instructions. Take 5 tablets by mouth for 3 days, 4 tabs for 3 days, 3 tabs for 3 days, 2 tabs for 3 days and 1 tab for 3 days.   rosuvastatin 20 MG tablet Commonly known as: CRESTOR Take 1 tablet (20 mg total) by mouth daily at 6 PM.   Systane 0.4-0.3 % Soln Generic drug: Polyethyl Glycol-Propyl Glycol Apply 2 drops to eye daily as needed (dry eye).            Durable Medical Equipment  (From admission, onward)         Start     Ordered   02/16/20 1509  For home use only DME oxygen  Once       Question Answer Comment  Length of Need Lifetime   Mode or (Route) Nasal cannula   Liters per Minute 2   Frequency Continuous (stationary and portable oxygen unit needed)   Oxygen conserving device Yes   Oxygen delivery system Gas      02/16/20 1509          Follow-up Information    Sherran Needs, NP Follow up on 02/23/2020.   Specialties: Nurse Practitioner, Cardiology Why: Follow-up with the [facility] on 02/23/2020 at 11:30 AM.  The parking code is 1212 and is located at the [facility] at [facility].  Contact information: [place] [address] [phone]              Allergies  Allergen Reactions  . Iodinated Diagnostic Agents Anaphylaxis    08-12-10 Pt had severe reaction with itching/hives/loss of consciousness within 5 minutes of CEPI today.  She received both Kenalog and Iondinated Contrast, so not certain which agent caused reaction.  If patient returns for another CEPI, Dr. Jobe Igo considers using Decadron and a 13-hour prep.  jkl  . Nerve Block Tray Anaphylaxis    Patient states that she was sent to have Epidural for her shoulder. After rec'ving she remembers saying that she was itching all over  And that's it. She was transported to hospital  By EMS. Stayed overnight.  [place] Spikes Derivatives Hives and Nausea And Vomiting  . Triamcinolone Acetonide Anaphylaxis  08-12-10  Pt had severe reaction with itching/hives/loss of consciousness within five minutes of CEPI today.  She received both Kenalog and Iodinated Contrast, so not certain which agent caused reaction.  If pt returns for another cervical epidural steroid injection, Dr. Jobe Igo suggests using Decadron and a 13-hr prep.  JKL PATIENT USES PROCTOCREAM 2.5% WITHOUT DIFFICULTY FOR VULVAR DYSTROPHY Patient says she can take prednisone without any problem  . Vibra-Tab [Doxycycline] Nausea And Vomiting   Allergies as of 02/16/2020      Reactions   Iodinated Diagnostic Agents Anaphylaxis   08-12-10 Pt had severe reaction with itching/hives/loss of consciousness within 5 minutes of CEPI today.  She received both Kenalog and Iondinated Contrast, so not certain which agent caused reaction.  If patient returns for another CEPI, Dr. Jobe Igo considers using Decadron and a 13-hour prep.  jkl   Nerve Block Tray Anaphylaxis   Patient states that she was sent to have Epidural for her shoulder. After rec'ving she remembers saying that  she was itching all over  And that's it. She was transported to hospital  By EMS. Stayed overnight.   Nitrofuran Derivatives Hives, Nausea And Vomiting   Triamcinolone Acetonide Anaphylaxis   08-12-10  Pt had severe reaction with itching/hives/loss of consciousness within five minutes of CEPI today.  She received both Kenalog and Iodinated Contrast, so not certain which agent caused reaction.  If pt returns for another cervical epidural steroid injection, Dr. Jobe Igo suggests using Decadron and a 13-hr prep.  JKL PATIENT USES PROCTOCREAM 2.5% WITHOUT DIFFICULTY FOR VULVAR DYSTROPHY Patient says she can take prednisone without any problem   Vibra-tab [doxycycline] Nausea And Vomiting      Medication List    STOP taking these medications   meclizine 12.5 MG tablet Commonly known as: ANTIVERT     TAKE these medications   apixaban 2.5 MG Tabs tablet Commonly known as: ELIQUIS Take 1 tablet (2.5 mg total) by mouth 2 (two) times daily.   cholecalciferol 25 MCG (1000 UNIT) tablet Commonly known as: VITAMIN D3 Take 2,000 Units by mouth 2 (two) times daily.   Fish Oil 1000 MG Caps Take 1 capsule by mouth daily.   HYDROcodone-acetaminophen 7.5-325 MG tablet Commonly known as: NORCO Take 1 tablet by mouth 3 (three) times daily as needed for moderate pain.   leflunomide 20 MG tablet Commonly known as: ARAVA Take 1 tablet (20 mg total) by mouth daily.   metoprolol succinate 100 MG 24 hr tablet Commonly known as: TOPROL-XL Take 100 mg by mouth daily.   omeprazole 20 MG capsule Commonly known as: PRILOSEC Take 1 capsule (20 mg total) by mouth in the morning and at bedtime.   predniSONE 10 MG tablet Commonly known as: DELTASONE Take 10 mg by mouth See admin instructions. Take 5 tablets by mouth for 3 days, 4 tabs for 3 days, 3 tabs for 3 days, 2 tabs for 3 days and 1 tab for 3 days.   rosuvastatin 20 MG tablet Commonly known as: CRESTOR Take 1 tablet (20 mg total) by mouth daily at 6  PM.   Systane 0.4-0.3 % Soln Generic drug: Polyethyl Glycol-Propyl Glycol Apply 2 drops to eye daily as needed (dry eye).            Durable Medical Equipment  (From admission, onward)         Start     Ordered   02/16/20 1509  For home use only DME oxygen  Once  Question Answer Comment  Length of Need Lifetime   Mode or (Route) Nasal cannula   Liters per Minute 2   Frequency Continuous (stationary and portable oxygen unit needed)   Oxygen conserving device Yes   Oxygen delivery system Gas      02/16/20 1509          Procedures/Studies: DG Chest Portable 1 View  Result Date: 02/16/2020 CLINICAL DATA:  Pulmonary edema EXAM: PORTABLE CHEST 1 VIEW COMPARISON:  02/15/2020 FINDINGS: Normal heart size and mediastinal contours. Increased interstitial prominence from before, with a few Kerley lines. No definite pleural effusion. No consolidation. IMPRESSION: Suspect mild interstitial edema. Electronically Signed   By: Monte Fantasia M.D.   On: 02/16/2020 10:43   DG Chest Port 1 View  Result Date: 02/15/2020 CLINICAL DATA:  Dyspnea, arrhythmia EXAM: PORTABLE CHEST 1 VIEW COMPARISON:  09/28/2019 chest radiograph. FINDINGS: Stable cardiomediastinal silhouette with normal heart size. No pneumothorax. No pleural effusion. Stable mild elevation of the right hemidiaphragm. No pulmonary edema. No acute consolidative airspace disease. IMPRESSION: No active disease. Electronically Signed   By: Ilona Sorrel M.D.   On: 02/15/2020 15:46      Subjective: Pt feeling much better after cardioversion was done this morning.  She really wants to go home.    Discharge Exam: Vitals:   02/16/20 1502 02/16/20 1505  BP:    Pulse: 84   Resp: 20   Temp:    SpO2: (!) 86% 95%   Vitals:   02/16/20 1430 02/16/20 1500 02/16/20 1502 02/16/20 1505  BP: 139/75 120/62    Pulse: 74 69 84   Resp: (!) 22 20 20    Temp:      TempSrc:      SpO2: 100% (!) 85% (!) 86% 95%  Weight:      Height:        General: Pt is alert, awake, not in acute distress, pale, frail, chronically ill appearing Cardiovascular: normal S1/S2 +, no rubs, no gallops Respiratory: CTA bilaterally, no wheezing, no rhonchi Abdominal: Soft, NT, ND, bowel sounds + Extremities: no edema, no cyanosis   The results of significant diagnostics from this hospitalization (including imaging, microbiology, ancillary and laboratory) are listed below for reference.     Microbiology: Recent Results (from the past 240 hour(s))  SARS Coronavirus 2 by RT PCR (hospital order, performed in [facility] hospital lab) Nasopharyngeal Nasopharyngeal Swab     Status: None   Collection Time: 02/15/20  3:18 PM   Specimen: Nasopharyngeal Swab  Result Value Ref Range Status   SARS Coronavirus 2 NEGATIVE NEGATIVE Final    Comment: (NOTE) SARS-CoV-2 target nucleic acids are NOT DETECTED.  The SARS-CoV-2 RNA is generally detectable in upper and lower respiratory specimens during the acute phase of infection. The lowest concentration of SARS-CoV-2 viral copies this assay can detect is 250 copies / mL. A negative result does not preclude SARS-CoV-2 infection and should not be used as the sole basis for treatment or other patient management decisions.  A negative result may occur with improper specimen collection / handling, submission of specimen other than nasopharyngeal swab, presence of viral mutation(s) within the areas targeted by this assay, and inadequate number of viral copies (<250 copies / mL). A negative result must be combined with clinical observations, patient history, and epidemiological information.  Fact Sheet for Patients:   StrictlyIdeas.no  Fact Sheet for Healthcare Providers: BankingDealers.co.za  This test is not yet approved or  cleared by the [place] FDA and  has been authorized for detection and/or diagnosis of SARS-CoV-2 by FDA under an Emergency Use  Authorization (EUA).  This EUA will remain in effect (meaning this test can be used) for the duration of the COVID-19 declaration under Section 564(b)(1) of the Act, 21 U.S.C. section 360bbb-3(b)(1), unless the authorization is terminated or revoked sooner.  Performed at [facility], [address], [place], [place] [address]      Labs: BNP (last 3 results) Recent Labs    02/16/20 0944  BNP 614.4*   Basic Metabolic Panel: Recent Labs  Lab 02/15/20 1435 02/16/20 0641  NA 142 141  K 3.5 4.3  CL 105 105  CO2 28 28  GLUCOSE 111* 99  BUN 33* 23  CREATININE 0.69 0.60  CALCIUM 7.6* 5.7*  MG 1.9  --    Liver Function Tests: Recent Labs  Lab 02/16/20 0641  ALBUMIN 2.8*   No results for input(s): LIPASE, AMYLASE in the last 168 hours. No results for input(s): AMMONIA in the last 168 hours. CBC: Recent Labs  Lab 02/15/20 1435  WBC 8.4  HGB 11.0*  HCT 35.7*  MCV 98.6  PLT 189   Cardiac Enzymes: No results for input(s): CKTOTAL, CKMB, CKMBINDEX, TROPONINI in the last 168 hours. BNP: Invalid input(s): POCBNP CBG: No results for input(s): GLUCAP in the last 168 hours. D-Dimer No results for input(s): DDIMER in the last 72 hours. Hgb A1c No results for input(s): HGBA1C in the last 72 hours. Lipid Profile No results for input(s): CHOL, HDL, LDLCALC, TRIG, CHOLHDL, LDLDIRECT in the last 72 hours. Thyroid function studies Recent Labs    02/15/20 1435  TSH 0.385   Anemia work up No results for input(s): VITAMINB12, FOLATE, FERRITIN, TIBC, IRON, RETICCTPCT in the last 72 hours. Urinalysis    Component Value Date/Time   COLORURINE STRAW (A) 10/18/2019 1121   APPEARANCEUR CLEAR 10/18/2019 1121   LABSPEC 1.004 (L) 10/18/2019 1121   PHURINE 7.0 10/18/2019 1121   GLUCOSEU NEGATIVE 10/18/2019 1121   HGBUR NEGATIVE 10/18/2019 1121   BILIRUBINUR NEGATIVE 10/18/2019 1121   KETONESUR NEGATIVE 10/18/2019 1121   PROTEINUR NEGATIVE 10/18/2019 1121   UROBILINOGEN 0.2  04/23/2014 1300   NITRITE NEGATIVE 10/18/2019 1121   LEUKOCYTESUR NEGATIVE 10/18/2019 1121   Sepsis Labs Invalid input(s): PROCALCITONIN,  WBC,  LACTICIDVEN Microbiology Recent Results (from the past 240 hour(s))  SARS Coronavirus 2 by RT PCR (hospital order, performed in [facility] hospital lab) Nasopharyngeal Nasopharyngeal Swab     Status: None   Collection Time: 02/15/20  3:18 PM   Specimen: Nasopharyngeal Swab  Result Value Ref Range Status   SARS Coronavirus 2 NEGATIVE NEGATIVE Final    Comment: (NOTE) SARS-CoV-2 target nucleic acids are NOT DETECTED.  The SARS-CoV-2 RNA is generally detectable in upper and lower respiratory specimens during the acute phase of infection. The lowest concentration of SARS-CoV-2 viral copies this assay can detect is 250 copies / mL. A negative result does not preclude SARS-CoV-2 infection and should not be used as the sole basis for treatment or other patient management decisions.  A negative result may occur with improper specimen collection / handling, submission of specimen other than nasopharyngeal swab, presence of viral mutation(s) within the areas targeted by this assay, and inadequate number of viral copies (<250 copies / mL). A negative result must be combined with clinical observations, patient history, and epidemiological information.  Fact Sheet for Patients:   StrictlyIdeas.no  Fact Sheet for Healthcare Providers: BankingDealers.co.za  This test is not  yet approved or  cleared by the [place] and has been authorized for detection and/or diagnosis of SARS-CoV-2 by FDA under an Emergency Use Authorization (EUA).  This EUA will remain in effect (meaning this test can be used) for the duration of the COVID-19 declaration under Section 564(b)(1) of the Act, 21 U.S.C. section 360bbb-3(b)(1), unless the authorization is terminated or revoked sooner.  Performed at [facility], [address] [place], [place] [address]    Time coordinating discharge:   SIGNED:  Irwin Brakeman, MD  [facility] 02/16/2020, 3:23 PM How to contact the [facility] Attending or Consulting provider [place] or covering provider during after hours [place], for this patient?  1. Check the care team in [place] and look for a) attending/consulting [facility] provider listed and b) the [facility] team listed 2. Log into [email or web address] and use [place]'s universal password to access. If you do not have the password, please contact the hospital operator. 3. Locate the [facility] provider you are looking for under [facility] and page to a number that you can be directly reached. 4. If you still have difficulty reaching the provider, please page the [facility] (Director on Call) for the Hospitalists listed on amion for assistance.

## 2020-02-16 NOTE — Discharge Instructions (Signed)
Atrial Fibrillation  Atrial fibrillation is a type of heartbeat that is irregular or fast. If you have this condition, your heart beats without any order. This makes it hard for your heart to pump blood in a normal way. Atrial fibrillation may come and go, or it may become a long-lasting problem. If this condition is not treated, it can put you at higher risk for stroke, heart failure, and other heart problems. What are the causes? This condition may be caused by diseases that damage the heart. They include:  High blood pressure.  Heart failure.  Heart valve disease.  Heart surgery. Other causes include:  Diabetes.  Thyroid disease.  Being overweight.  Kidney disease. Sometimes the cause is not known. What increases the risk? You are more likely to develop this condition if:  You are older.  You smoke.  You exercise often and very hard.  You have a family history of this condition.  You are a man.  You use drugs.  You drink a lot of alcohol.  You have lung conditions, such as emphysema, pneumonia, or COPD.  You have sleep apnea. What are the signs or symptoms? Common symptoms of this condition include:  A feeling that your heart is beating very fast.  Chest pain or discomfort.  Feeling short of breath.  Suddenly feeling light-headed or weak.  Getting tired easily during activity.  Fainting.  Sweating. In some cases, there are no symptoms. How is this treated? Treatment for this condition depends on underlying conditions and how you feel when you have atrial fibrillation. They include:  Medicines to: ? Prevent blood clots. ? Treat heart rate or heart rhythm problems.  Using devices, such as a pacemaker, to correct heart rhythm problems.  Doing surgery to remove the part of the heart that sends bad signals.  Closing an area where clots can form in the heart (left atrial appendage). In some cases, your doctor will treat other underlying  conditions. Follow these instructions at home: Medicines  Take over-the-counter and prescription medicines only as told by your doctor.  Do not take any new medicines without first talking to your doctor.  If you are taking blood thinners: ? Talk with your doctor before you take any medicines that have aspirin or NSAIDs, such as ibuprofen, in them. ? Take your medicine exactly as told by your doctor. Take it at the same time each day. ? Avoid activities that could hurt or bruise you. Follow instructions about how to prevent falls. ? Wear a bracelet that says you are taking blood thinners. Or, carry a card that lists what medicines you take. Lifestyle  Do not use any products that have nicotine or tobacco in them. These include cigarettes, e-cigarettes, and chewing tobacco. If you need help quitting, ask your doctor.  Eat heart-healthy foods. Talk with your doctor about the right eating plan for you.  Exercise regularly as told by your doctor.  Do not drink alcohol.  Lose weight if you are overweight.  Do not use drugs, including cannabis.      General instructions  If you have a condition that causes breathing to stop for a short period of time (apnea), treat it as told by your doctor.  Keep a healthy weight. Do not use diet pills unless your doctor says they are safe for you. Diet pills may make heart problems worse.  Keep all follow-up visits as told by your doctor. This is important. Contact a doctor if:  You notice a  change in the speed, rhythm, or strength of your heartbeat.  You are taking a blood-thinning medicine and you get more bruising.  You get tired more easily when you move or exercise.  You have a sudden change in weight. Get help right away if:  You have pain in your chest or your belly (abdomen).  You have trouble breathing.  You have side effects of blood thinners, such as blood in your vomit, poop (stool), or pee (urine), or bleeding that cannot  stop.  You have any signs of a stroke. "BE FAST" is an easy way to remember the main warning signs: ? B - Balance. Signs are dizziness, sudden trouble walking, or loss of balance. ? E - Eyes. Signs are trouble seeing or a change in how you see. ? F - Face. Signs are sudden weakness or loss of feeling in the face, or the face or eyelid drooping on one side. ? A - Arms. Signs are weakness or loss of feeling in an arm. This happens suddenly and usually on one side of the body. ? S - Speech. Signs are sudden trouble speaking, slurred speech, or trouble understanding what people say. ? T - Time. Time to call emergency services. Write down what time symptoms started.  You have other signs of a stroke, such as: ? A sudden, very bad headache with no known cause. ? Feeling like you may vomit (nausea). ? Vomiting. ? A seizure. These symptoms may be an emergency. Do not wait to see if the symptoms will go away. Get medical help right away. Call your local emergency services (911 in the [place] Do not drive yourself to the hospital.   Summary  Atrial fibrillation is a type of heartbeat that is irregular or fast.  You are at higher risk of this condition if you smoke, are older, have diabetes, or are overweight.  Follow your doctor's instructions about medicines, diet, exercise, and follow-up visits.  Get help right away if you have signs or symptoms of a stroke.  Get help right away if you cannot catch your breath, or you have chest pain or discomfort. This information is not intended to replace advice given to you by your health care provider. Make sure you discuss any questions you have with your health care provider. Document Revised: 06/22/2018 Document Reviewed: 06/22/2018 Elsevier Patient Education  2021 [place].   IMPORTANT INFORMATION: PAY CLOSE ATTENTION   PHYSICIAN DISCHARGE INSTRUCTIONS  Follow with Primary care provider  Sharilyn Sites, MD  and other consultants as instructed  by your Hospitalist Physician  [facility] IF SYMPTOMS COME BACK, WORSEN OR NEW PROBLEM DEVELOPS   Please note: You were cared for by a hospitalist during your hospital stay. Every effort will be made to forward records to your primary care provider.  You can request that your primary care provider send for your hospital records if they have not received them.  Once you are discharged, your primary care physician will handle any further medical issues. Please note that NO REFILLS for any discharge medications will be authorized once you are discharged, as it is imperative that you return to your primary care physician (or establish a relationship with a primary care physician if you do not have one) for your post hospital discharge needs so that they can reassess your need for medications and monitor your lab values.  Please get a complete blood count and chemistry panel checked by your Primary MD  at your next visit, and again as instructed by your Primary MD.  Get Medicines reviewed and adjusted: Please take all your medications with you for your next visit with your Primary MD  Laboratory/radiological data: Please request your Primary MD to go over all hospital tests and procedure/radiological results at the follow up, please ask your primary care provider to get all Hospital records sent to his/her office.  In some cases, they will be blood work, cultures and biopsy results pending at the time of your discharge. Please request that your primary care provider follow up on these results.  If you are diabetic, please bring your blood sugar readings with you to your follow up appointment with primary care.    Please call and make your follow up appointments as soon as possible.    Also Note the following: If you experience worsening of your admission symptoms, develop shortness of breath, life threatening emergency, suicidal or homicidal thoughts you must seek  medical attention immediately by calling 911 or calling your MD immediately  if symptoms less severe.  You must read complete instructions/literature along with all the possible adverse reactions/side effects for all the Medicines you take and that have been prescribed to you. Take any new Medicines after you have completely understood and accpet all the possible adverse reactions/side effects.   Do not drive when taking Pain medications or sleeping medications (Benzodiazepines)  Do not take more than prescribed Pain, Sleep and Anxiety Medications. It is not advisable to combine anxiety,sleep and pain medications without talking with your primary care practitioner  Special Instructions: If you have smoked or chewed Tobacco  in the last 2 yrs please stop smoking, stop any regular Alcohol  and or any Recreational drug use.  Wear Seat belts while driving.  Do not drive if taking any narcotic, mind altering or controlled substances or recreational drugs or alcohol.

## 2020-02-16 NOTE — Sedation Documentation (Signed)
Shock dilvered @ 150J

## 2020-02-16 NOTE — Consult Note (Addendum)
Cardiology Consult    Patient ID: HAZEL LEVEILLE; [id number]; 02-18-1933   Admit date: 02/15/2020 Date of Consult: 02/16/2020  Primary Care Provider: Sharilyn Sites, MD Primary Cardiologist: Carlyle Dolly, MD   Patient Profile    [name] is a 87 y.o. female with past medical history of paroxysmal atrial fibrillation (persistent episode in 09/2019 requiring DCCV), interstitial lung disease, rheumatoid arthritis and prior CVA who is being seen today for the evaluation of atrial fibrillation with RVR at the request of Dr. Wynetta Emery.   History of Present Illness    [name] was recently examined by Dr. Harl Bowie on 01/26/2020 and reported occasional palpitations which can last for up to 30 minutes at a time but denied any persistent symptoms.  She was continued on her current cardiac medications including Eliquis 2.5 mg twice daily, Clonidine 0.1 mg twice daily, Toprol-XL 100 mg daily and Crestor 20 mg daily.  She called the office on 02/15/2020 reporting her heart rate have been elevated in the 130's to 150's when checked at home for the past week. She was advised to go to the ED for further evaluation.  On arrival to the ED, she was found to be in atrial fibrillation with RVR, heart rate in the 150's.  Initial labs showed WBC 8.4, Hgb 11.0, platelets 189, Na+ 142, K+ 3.5, creatinine 0.69 and calcium 7.6. Mg 1.9. TSH 0.385. CXR shows no active disease. COVID negative. EKG shows atrial flutter with RVR, HR 144.  Repeat labs this AM show calcium low at 5.7. Rates were in the 80's to 90's overnight but are in the 140's to 150's this AM now that she is awake and talking. She denies any chest pain or palpitations. Says her main concern has been dyspnea with exertion and orthopnea for the past week. No lower extremity edema. She says she has been compliant with her medications and denies missing any doses of Eliquis.   Past Medical History:  Diagnosis Date  . A-fib ([place])   . Atrial  fibrillation ([place])   . Cervicalgia   . Colon cancer ([place])   . Degeneration of cervical intervertebral disc   . Hypertension   . Pinched nerve in shoulder   . Rheumatoid arthritis(714.0)   . RUQ pain   . Vulvar dystrophy   . Yeast infection     Past Surgical History:  Procedure Laterality Date  . ABDOMINAL HYSTERECTOMY  1960'S  . APPENDECTOMY  with hysterectomy  . BACK SURGERY  12/08/10   plate/screws C3 C4  . CARDIOVERSION N/A 09/29/2019   Procedure: CARDIOVERSION;  Surgeon: Arnoldo Lenis, MD;  [facility];  Service: Endoscopy;  Laterality: N/A;  pt ate early this AM per Dr. Harl Bowie  . CHOLECYSTECTOMY  2002  . COLONOSCOPY  04/02/2009  . COLONOSCOPY  06/04/05  . COLONOSCOPY N/A 07/26/2015   Procedure: COLONOSCOPY;  Surgeon: Rogene Houston, MD;  Location: [facility];  Service: Endoscopy;  Laterality: N/A;  1240  . COLONOSCOPY WITH ESOPHAGOGASTRODUODENOSCOPY (EGD) N/A 04/08/2012   Procedure: COLONOSCOPY WITH ESOPHAGOGASTRODUODENOSCOPY (EGD);  Surgeon: Rogene Houston, MD;  [facility];  Service: Endoscopy;  Laterality: N/A;  130-moved to 245 Ann notified pt  . ESOPHAGOGASTRODUODENOSCOPY N/A 07/26/2015   Procedure: ESOPHAGOGASTRODUODENOSCOPY (EGD);  Surgeon: Rogene Houston, MD;  [facility];  Service: Endoscopy;  Laterality: N/A;  . HEMICOLECTOMY  10/05     Home Medications:  Prior to Admission medications   Medication Sig Start Date  End Date Taking? Authorizing Provider  apixaban (ELIQUIS) 2.5 MG TABS tablet Take 1 tablet (2.5 mg total) by mouth 2 (two) times daily. 09/08/18  Yes Gerlene Fee, NP  cholecalciferol (VITAMIN D3) 25 MCG (1000 UT) tablet Take 2,000 Units by mouth 2 (two) times daily.    Yes [provider]  HYDROcodone-acetaminophen (NORCO) 7.5-325 MG tablet Take 1 tablet by mouth 3 (three) times daily as needed for moderate pain. 09/08/18  Yes Gerlene Fee, NP  leflunomide (ARAVA) 20 MG tablet Take 1 tablet (20 mg  total) by mouth daily. 09/08/18  Yes Gerlene Fee, NP  metoprolol succinate (TOPROL-XL) 100 MG 24 hr tablet Take 100 mg by mouth daily. 10/19/19  Yes [provider]  Omega-3 Fatty Acids (FISH OIL) 1000 MG CAPS Take 1 capsule by mouth daily.   Yes [provider]  omeprazole (PRILOSEC) 20 MG capsule TAKE 1 CAPSULE BY MOUTH TWICE DAILY Patient taking differently: Take 20 mg by mouth in the morning and at bedtime. 07/25/19  Yes Laurine Blazer B, PA-C  Polyethyl Glycol-Propyl Glycol (SYSTANE) 0.4-0.3 % SOLN Apply 2 drops to eye daily as needed (dry eye).   Yes [provider]  predniSONE (DELTASONE) 10 MG tablet Take 10 mg by mouth See admin instructions. Take 5 tablets by mouth for 3 days, 4 tabs for 3 days, 3 tabs for 3 days, 2 tabs for 3 days and 1 tab for 3 days. 02/08/20  Yes [provider]  rosuvastatin (CRESTOR) 20 MG tablet Take 1 tablet (20 mg total) by mouth daily at 6 PM. 09/08/18  Yes Green, Phylis Bougie, NP  meclizine (ANTIVERT) 12.5 MG tablet Take 1 tablet (12.5 mg total) by mouth 2 (two) times daily as needed for dizziness. Patient not taking: Reported on 02/15/2020 09/08/18   Gerlene Fee, NP    Inpatient Medications: Scheduled Meds: . apixaban  2.5 mg Oral BID  . cholecalciferol  2,000 Units Oral BID  . furosemide  40 mg Intravenous Once  . leflunomide  20 mg Oral Daily  . metoprolol tartrate  50 mg Oral BID  . omega-3 acid ethyl esters  1 g Oral Daily  . pantoprazole  40 mg Oral BID  . [START ON 02/17/2020] predniSONE  20 mg Oral Q breakfast   Followed by  . [START ON 02/20/2020] predniSONE  10 mg Oral Q breakfast  . propofol  0.6 mg/kg Intravenous Once  . rosuvastatin  20 mg Oral q1800   Continuous Infusions: . sodium chloride 50 mL/hr at 02/15/20 2331  . calcium gluconate 1,000 mg (02/16/20 0847)  . diltiazem (CARDIZEM) infusion 12.5 mg/hr (02/16/20 0905)   PRN Meds: acetaminophen, HYDROcodone-acetaminophen, ondansetron (ZOFRAN)  IV  Allergies:    Allergies  Allergen Reactions  . Iodinated Diagnostic Agents Anaphylaxis    08-12-10 Pt had severe reaction with itching/hives/loss of consciousness within 5 minutes of CEPI today.  She received both Kenalog and Iondinated Contrast, so not certain which agent caused reaction.  If patient returns for another CEPI, Dr. Jobe Igo considers using Decadron and a 13-hour prep.  jkl  . Nerve Block Tray Anaphylaxis    Patient states that she was sent to have Epidural for her shoulder. After rec'ving she remembers saying that she was itching all over  And that's it. She was transported to hospital  By EMS. Stayed overnight.  [place] Spikes Derivatives Hives and Nausea And Vomiting  . Triamcinolone Acetonide Anaphylaxis    08-12-10  Pt had severe reaction with  itching/hives/loss of consciousness within five minutes of CEPI today.  She received both Kenalog and Iodinated Contrast, so not certain which agent caused reaction.  If pt returns for another cervical epidural steroid injection, Dr. Jobe Igo suggests using Decadron and a 13-hr prep.  JKL PATIENT USES PROCTOCREAM 2.5% WITHOUT DIFFICULTY FOR VULVAR DYSTROPHY Patient says she can take prednisone without any problem  . Vibra-Tab [Doxycycline] Nausea And Vomiting    Social History:   Social History   Tobacco Use  . Smoking status: Never Smoker  . Smokeless tobacco: Never Used  Substance Use Topics  . Alcohol use: No    Family History:    Family History  Problem Relation Age of Onset  . Rheum arthritis Mother   . Parkinsonism Father   . Hiatal hernia Sister   . Healthy Brother   . Healthy Daughter   . Healthy Son   . Healthy Son       Review of Systems    General:  No chills, fever, night sweats or weight changes.  Cardiovascular:  No chest pain, palpitations, paroxysmal nocturnal dyspnea. Positive for dyspnea on exertion and orthopnea.  Dermatological: No rash, lesions/masses Respiratory: No cough. Positive for  dyspnea. Urologic: No hematuria, dysuria Abdominal:   No nausea, vomiting, diarrhea, bright red blood per rectum, melena, or hematemesis Neurologic:  No visual changes, wkns, changes in mental status. All other systems reviewed and are otherwise negative except as noted above.  Physical Exam/Data    Vitals:   02/16/20 0700 02/16/20 0715 02/16/20 0728 02/16/20 0835  BP: 140/78   (!) 153/89  Pulse: 76 (!) 113    Resp: (!) 25 (!) 27  (!) 24  Temp:   98.8 F (37.1 C)   TempSrc:   Oral   SpO2: 97% 95%    Weight:      Height:        Intake/Output Summary (Last 24 hours) at 02/16/2020 0927 Last data filed at 02/15/2020 1606 Gross per 24 hour  Intake 391.67 ml  Output -  Net 391.67 ml   Filed Weights   02/15/20 1358  Weight: 52.2 kg   Body mass index is 21.03 kg/m.   General: Pleasant elderly female appearing in NAD Psych: Normal affect. Neuro: Alert and oriented X 3. Moves all extremities spontaneously. HEENT: Normal  Neck: Supple without bruits or JVD. Lungs:  Resp regular and unlabored, CTA without wheezing or rales. Heart: Irregularly irregular. No s3, s4, or murmurs. Abdomen: Soft, non-tender, non-distended, BS + x 4.  Extremities: No clubbing, cyanosis or lower extremity edema. DP/PT/Radials 2+ and equal bilaterally.   EKG:  The EKG was personally reviewed and demonstrates: Atrial flutter with RVR, HR 144.   Labs/Studies     Relevant CV Studies:  NST: 10/2018  Equivocal, nondiagnostic ST segment depression noted with persistent T wave inversions.  Small, moderate intensity, mid to basal inferoseptal defect that is fixed and most prominent on the rest imaging. This is consistent with soft tissue attenuation. No reversible perfusion defects to indicate ischemia.  This is a low risk study.  Nuclear stress EF: 72%.  Echocardiogram: 09/2019 IMPRESSIONS    1. Left ventricular ejection fraction, by estimation, is 70 to 75%. The  left ventricle has normal  function. The left ventricle has no regional  wall motion abnormalities. Left ventricular diastolic parameters are  indeterminate.  2. Right ventricular systolic function is normal. The right ventricular  size is normal.  3. Left atrial size was mildly dilated.  4.  The mitral valve is normal in structure. Trivial mitral valve  regurgitation. No evidence of mitral stenosis.  5. The aortic valve is tricuspid. Aortic valve regurgitation is mild. No  aortic stenosis is present.  6. The inferior vena cava is normal in size with greater than 50%  respiratory variability, suggesting right atrial pressure of 3 mmHg.   Laboratory Data:  Chemistry Recent Labs  Lab 02/15/20 1435 02/16/20 0641  NA 142 141  K 3.5 4.3  CL 105 105  CO2 28 28  GLUCOSE 111* 99  BUN 33* 23  CREATININE 0.69 0.60  CALCIUM 7.6* 5.7*  GFRNONAA >60 >60  ANIONGAP 9 8    Recent Labs  Lab 02/16/20 0641  ALBUMIN 2.8*   Hematology Recent Labs  Lab 02/15/20 1435  WBC 8.4  RBC 3.62*  HGB 11.0*  HCT 35.7*  MCV 98.6  MCH 30.4  MCHC 30.8  RDW 18.2*  PLT 189    Radiology/Studies:  DG Chest Port 1 View  Result Date: 02/15/2020 CLINICAL DATA:  Dyspnea, arrhythmia EXAM: PORTABLE CHEST 1 VIEW COMPARISON:  09/28/2019 chest radiograph. FINDINGS: Stable cardiomediastinal silhouette with normal heart size. No pneumothorax. No pleural effusion. Stable mild elevation of the right hemidiaphragm. No pulmonary edema. No acute consolidative airspace disease. IMPRESSION: No active disease. Electronically Signed   By: Ilona Sorrel M.D.   On: 02/15/2020 15:46     Assessment & Plan    1. Atrial Fibrillation/Flutter with RVR - She has a history of paroxysmal atrial fibrillation and underwent successful DCCV in 09/2019. Presented with a 1-week history of progressive dyspnea and HR in the 130's to 150's at home. Telemetry more consistent with atrial flutter at times. She is on IV Cardizem at 12.5 mg/hr but rates go into  the 140's to 150's with minimal activity such as moving in bed. Discussed with Dr. Domenic Polite and given that she has not missed any doses of Eliquis, will see if DCCV can be performed while she is in the ED. She has been NPO since admission. If she converts back to NSR, would continue PTA Toprol-XL 100mg  daily. She would benefit from referral to the [facility] as an outpatient to discuss antiarrhythmic options as she is not an ideal candidate for Amiodarone given her ILD.  - This patients CHA2DS2-VASc Score and unadjusted Ischemic Stroke Rate (% per year) is equal to 4.8 % stroke rate/year from a score of 4 (HTN, Female, Age (2)). Continue Eliquis 2.5mg  BID given age and weight.   Addendum: Discussed with Dr. Langston Masker and he is willing to perform a bedside DCCV while the patient is in the ED. Called and updated the patient's son on plan of care. Dr. Wynetta Emery also made aware. Hopefully she can be discharged later today if all goes well with her DCCV and she maintains NSR after the procedure.   2. ILD - Followed by [facility] as an outpatient. She does not require supplemental oxygen at home but is on 3L currently. Given her reported orthopnea, will check a BNP and order Lasix 40mg  x1.  3. Hypocalcemia - Ca+ at 5.7 this AM. Supplementation has been ordered by the admitting team.    For questions or updates, please contact [facility] Please consult [email or web address] for contact info under Cardiology/STEMI.  Signed, Erma Heritage, PA-C 02/16/2020, 9:27 AM Pager: [phone]   Attending note:  Patient seen and examined. I reviewed her records and discussed the case with [name], I agree with her above findings.  [name] presents to the [facility] ER with recent onset palpitations and elevated heart rate, noted to be in atrial fibrillation/flutter with RVR. She has a known history of PAF with CHA2DS2-VASc score of 4 and is on Eliquis for stroke prophylaxis. Her  last cardioversion was in September 2021.  I personally reviewed her ECG from 02/15/2020 consistent with coarse atrial fibrillation at 140, nonspecific ST changes. She reported compliance with her outpatient Eliquis and case was discussed with EDP with recommendation to proceed with cardioversion which was performed successfully.  On my examination post cardioversion patient is in sinus rhythm with frequent PACs, states that she felt less short of breath and with resolution of palpitations. Rate in the 70s. Lungs with decreased breath sounds at the bases. Cardiac exam with RRR and ectopy.  Potassium 4.3, creatinine 0.6, BNP 328, hemoglobin 11.0, platelets 189, SARS coronavirus 2 test negative.  Chest x-ray reports mild interstitial edema.  Patient will be observed in the ER post cardioversion and also given a dose of IV Lasix. Anticipate discharge home today with follow-up being arranged in the atrial fibrillation clinic. She has not been on antiarrhythmic therapy as an outpatient, not a good candidate for amiodarone in light of known interstitial lung disease. She will continue Eliquis and Toprol-XL. May be candidate for consideration of Multaq or Tikosyn.  Satira Sark, M.D., F.A.C.C.

## 2020-02-16 NOTE — ED Provider Notes (Signed)
I was asked by the hospitalist and cardiology team (Dr. Domenic Polite) to discuss bedside cardioversion with the patient, as they are unable to achieve rate control with medications in the hospital.  Unfortunately I am told hospital cardioversion would likely be unavailable through the weekend.  I therefore discussed with the patient bedside cardioversion in the ED. we discussed the risks and benefits of this procedure.  We discussed the risk and benefits using propofol for sedation.  This includes sudden or worsening hypotension, organ damage, stroke, respiratory distress or failure, need for intubation, chest pain, thermal burns, and possible failed cardioversion.  She verbalized understanding wishes to proceed with cardioversion.    I did note that the patient has developed some mild hypoxia in the emergency department.  She says her baseline O2 sat is typically 92 to 93% at home.  Here she is satting between 90 and 92%.  I suspect there may be some development of congestive heart failure in the setting of her rapid A. fib with RVR.  Her Covid test is negative.  I therefore do think she would benefit from cardioversion.  We will use the lightest and quickest sedation available with 0.6 mg/kg propofol.    Last meal and drink was > 6 hours ago.  945 - successful cardioversion to sinus rhythm with PAC's, HR 80s.  Patient awake from sedation.  Bedside ultrasound shows B lines in bilateral lungs consistent with pulm edema - possible CHF exacerbation in setting of uncontrolled A Fib.  We've ordered 40 mg IV lasix.  She is stable on 2L [place], O2 is 93%.  Report given to hospitalist.  .Cardioversion  Date/Time: 02/16/2020 9:46 AM Performed by: Wyvonnia Dusky, MD Authorized by: Wyvonnia Dusky, MD   Consent:    Consent obtained:  Written   Consent given by:  Patient   Risks discussed:  Cutaneous burn, death, pain and induced arrhythmia   Alternatives discussed:  No treatment and rate-control  medication Pre-procedure details:    Cardioversion basis:  Elective   Rhythm:  Atrial fibrillation   Electrode placement:  Anterior-posterior Patient sedated: Yes. Refer to sedation procedure documentation for details of sedation.  Attempt one:    Cardioversion mode:  Synchronous   Waveform:  Biphasic   Shock (Joules):  150   Shock outcome:  Conversion to normal sinus rhythm Post-procedure details:    Patient status:  Awake   Patient tolerance of procedure:  Tolerated well, no immediate complications .Sedation  Date/Time: 02/16/2020 9:47 AM Performed by: Wyvonnia Dusky, MD Authorized by: Wyvonnia Dusky, MD   Consent:    Consent obtained:  Written   Consent given by:  Patient   Risks discussed:  Allergic reaction, dysrhythmia, inadequate sedation, nausea, prolonged hypoxia resulting in organ damage, prolonged sedation necessitating reversal, respiratory compromise necessitating ventilatory assistance and intubation and vomiting   Alternatives discussed:  Analgesia without sedation, anxiolysis and regional anesthesia Universal protocol:    Procedure explained and questions answered to patient or proxy's satisfaction: yes     Relevant documents present and verified: yes     Test results available: yes     Imaging studies available: yes     Required blood products, implants, devices, and special equipment available: yes     Site/side marked: yes     Immediately prior to procedure, a time out was called: yes     Patient identity confirmed:  Verbally with patient Indications:    Procedure necessitating sedation performed by:  Physician performing sedation  Pre-sedation assessment:    Time since last food or drink:  8 hours   ASA classification: class 2 - patient with mild systemic disease     Mouth opening:  3 or more finger widths   Thyromental distance:  4 finger widths   Mallampati score:  II - soft palate, uvula, fauces visible   Neck mobility: normal     Pre-sedation  assessments completed and reviewed: airway patency, cardiovascular function, hydration status, mental status, nausea/vomiting, pain level, respiratory function and temperature   Immediate pre-procedure details:    Reassessment: Patient reassessed immediately prior to procedure     Reviewed: vital signs, relevant labs/tests and NPO status     Verified: bag valve mask available, emergency equipment available, intubation equipment available, IV patency confirmed, oxygen available and suction available   Procedure details (see MAR for exact dosages):    Preoxygenation:  Nasal cannula   Sedation:  Propofol   Intended level of sedation: deep   Intra-procedure monitoring:  Blood pressure monitoring, cardiac monitor, continuous pulse oximetry, frequent LOC assessments, frequent vital sign checks and continuous capnometry   Intra-procedure events: none     Total Provider sedation time (minutes):  40 Post-procedure details:    Attendance: Constant attendance by certified staff until patient recovered     Recovery: Patient returned to pre-procedure baseline     Post-sedation assessments completed and reviewed: airway patency, cardiovascular function, hydration status, mental status, nausea/vomiting, pain level, respiratory function and temperature     Patient is stable for discharge or admission: yes     Procedure completion:  Tolerated well, no immediate complications      Wyvonnia Dusky, MD 02/16/20 [phone]

## 2020-02-19 ENCOUNTER — Other Ambulatory Visit: Payer: Self-pay

## 2020-02-19 ENCOUNTER — Ambulatory Visit (HOSPITAL_COMMUNITY)
Admission: RE | Admit: 2020-02-19 | Discharge: 2020-02-19 | Disposition: A | Discharge status: Home / Self Care | Payer: Medicare Other | Source: Ambulatory Visit | Attending: Nurse Practitioner | Admitting: Nurse Practitioner

## 2020-02-19 VITALS — BP 94/46 | HR 121 | Ht 62.0 in | Wt 116.2 lb

## 2020-02-19 DIAGNOSIS — I4819 Other persistent atrial fibrillation: Secondary | ICD-10-CM | POA: Insufficient documentation

## 2020-02-19 DIAGNOSIS — D6869 Other thrombophilia: Secondary | ICD-10-CM | POA: Diagnosis not present

## 2020-02-19 DIAGNOSIS — Z881 Allergy status to other antibiotic agents status: Secondary | ICD-10-CM | POA: Diagnosis not present

## 2020-02-19 DIAGNOSIS — Z95810 Presence of automatic (implantable) cardiac defibrillator: Secondary | ICD-10-CM | POA: Insufficient documentation

## 2020-02-19 DIAGNOSIS — R0602 Shortness of breath: Secondary | ICD-10-CM | POA: Diagnosis not present

## 2020-02-19 DIAGNOSIS — Z7901 Long term (current) use of anticoagulants: Secondary | ICD-10-CM | POA: Insufficient documentation

## 2020-02-19 DIAGNOSIS — Z79899 Other long term (current) drug therapy: Secondary | ICD-10-CM | POA: Diagnosis not present

## 2020-02-19 MED ORDER — MULTAQ 400 MG PO TABS: 400.0000 mg | ORAL_TABLET | Freq: Two times a day (BID) | ORAL | 2 refills | Status: DC

## 2020-02-19 NOTE — Patient Instructions (Signed)
Cardioversion scheduled for Monday, February 14th  - Arrive at the [facility] and go to admitting at [facility] not eat or drink anything after midnight the night prior to your procedure.  - Take all your morning medication (except diabetic medications) with a sip of water prior to arrival.  - You will not be able to drive home after your procedure.  - Do NOT miss any doses of your blood thinner - if you should miss a dose please notify our office immediately.  - If you feel as if you go back into normal rhythm prior to scheduled cardioversion, please notify our office immediately. If your procedure is canceled in the cardioversion suite you will be charged a cancellation fee.    Start Multaq 400mg  twice a day WITH FOOD

## 2020-02-19 NOTE — Progress Notes (Addendum)
Primary Care Physician: Sharilyn Sites, MD Primary Cardiologist: Dr Harl Bowie Primary Electrophysiologist: none Referring Physician: Dr Marlowe Alt is a 87 y.o. female with a history of atrial fibrillation, prior CVA, interstitial lung disease, rheumatoid arthritis who presents for consultation in the [facility]. The patient was initially diagnosed with atrial fibrillation remotely and has been on Eliquis for a CHADS2VASC score of 6. She underwent DCCV on 09/2019 and had recurrent rapid afib on 02/15/20 and underwent DCCV at that time. She has dyspnea associated with her afib. Unfortunately, she is back in rapid afib again today. She feels she was back out of rhythm 02/18/20. She has been on a prednisone taper for her RA. Her chronic SOB is stable.   Today, she denies symptoms of chest pain, orthopnea, PND, lower extremity edema, dizziness, presyncope, syncope, snoring, daytime somnolence, bleeding, or neurologic sequela. The patient is tolerating medications without difficulties and is otherwise without complaint today.    Atrial Fibrillation Risk Factors:  she does not have symptoms or diagnosis of sleep apnea. she does not have a history of rheumatic fever. she does not have a history of alcohol use. The patient does not have a history of early familial atrial fibrillation or other arrhythmias.  she has a BMI of Body mass index is 21.25 kg/m.Marland Kitchen Filed Weights   02/19/20 1036  Weight: 52.7 kg    Family History  Problem Relation Age of Onset  . Rheum arthritis Mother   . Parkinsonism Father   . Hiatal hernia Sister   . Healthy Brother   . Healthy Daughter   . Healthy Son   . Healthy Son      Atrial Fibrillation Management history:  Previous antiarrhythmic drugs: none Previous cardioversions: 09/2019, 02/16/20 Previous ablations: none CHADS2VASC score: 6 Anticoagulation history: Eliquis   Past Medical History:  Diagnosis Date  .  A-fib ([place])   . Atrial fibrillation ([place])   . Cervicalgia   . Chronic bronchitis with COPD (chronic obstructive pulmonary disease) ([place])   . Colon cancer ([place])   . Degeneration of cervical intervertebral disc   . Hypertension   . Pinched nerve in shoulder   . Rheumatoid arthritis(714.0)   . RUQ pain   . Vulvar dystrophy   . Yeast infection    Past Surgical History:  Procedure Laterality Date  . ABDOMINAL HYSTERECTOMY  1960'S  . APPENDECTOMY  with hysterectomy  . BACK SURGERY  12/08/10   plate/screws C3 C4  . CARDIOVERSION N/A 09/29/2019   Procedure: CARDIOVERSION;  Surgeon: Arnoldo Lenis, MD;  Location: [facility];  Service: Endoscopy;  Laterality: N/A;  pt ate early this AM per Dr. Harl Bowie  . CHOLECYSTECTOMY  2002  . COLONOSCOPY  04/02/2009  . COLONOSCOPY  06/04/05  . COLONOSCOPY N/A 07/26/2015   Procedure: COLONOSCOPY;  Surgeon: Rogene Houston, MD;  Location: [facility];  Service: Endoscopy;  Laterality: N/A;  1240  . COLONOSCOPY WITH ESOPHAGOGASTRODUODENOSCOPY (EGD) N/A 04/08/2012   Procedure: COLONOSCOPY WITH ESOPHAGOGASTRODUODENOSCOPY (EGD);  Surgeon: Rogene Houston, MD;  Location: [facility];  Service: Endoscopy;  Laterality: N/A;  130-moved to 245 Ann notified pt  . ESOPHAGOGASTRODUODENOSCOPY N/A 07/26/2015   Procedure: ESOPHAGOGASTRODUODENOSCOPY (EGD);  Surgeon: Rogene Houston, MD;  Location: [facility];  Service: Endoscopy;  Laterality: N/A;  . HEMICOLECTOMY  10/05    Current Outpatient Medications  Medication Sig Dispense Refill  . apixaban (ELIQUIS) 2.5 MG TABS tablet Take 1 tablet (2.5  mg total) by mouth 2 (two) times daily. 60 tablet 0  . cholecalciferol (VITAMIN D3) 25 MCG (1000 UT) tablet Take 2,000 Units by mouth 2 (two) times daily.     Marland Kitchen dronedarone (MULTAQ) 400 MG tablet Take 1 tablet (400 mg total) by mouth 2 (two) times daily with a meal. 60 tablet 2  . HYDROcodone-acetaminophen (NORCO) 7.5-325 MG tablet Take 1 tablet by mouth 3 (three) times  daily as needed for moderate pain. 15 tablet 0  . leflunomide (ARAVA) 20 MG tablet Take 1 tablet (20 mg total) by mouth daily. 30 tablet 0  . metoprolol succinate (TOPROL-XL) 100 MG 24 hr tablet Take 100 mg by mouth daily.    . Omega-3 Fatty Acids (FISH OIL) 1000 MG CAPS Take 1 capsule by mouth daily.    Marland Kitchen omeprazole (PRILOSEC) 20 MG capsule Take 1 capsule (20 mg total) by mouth in the morning and at bedtime.    Vladimir Faster Glycol-Propyl Glycol (SYSTANE) 0.4-0.3 % SOLN Apply 2 drops to eye daily as needed (dry eye).    . predniSONE (DELTASONE) 10 MG tablet Take 10 mg by mouth See admin instructions. Take 5 tablets by mouth for 3 days, 4 tabs for 3 days, 3 tabs for 3 days, 2 tabs for 3 days and 1 tab for 3 days.    . rosuvastatin (CRESTOR) 20 MG tablet Take 1 tablet (20 mg total) by mouth daily at 6 PM. 30 tablet 0   No current facility-administered medications for this encounter.    Allergies  Allergen Reactions  . Iodinated Diagnostic Agents Anaphylaxis    08-12-10 Pt had severe reaction with itching/hives/loss of consciousness within 5 minutes of CEPI today.  She received both Kenalog and Iondinated Contrast, so not certain which agent caused reaction.  If patient returns for another CEPI, Dr. Jobe Igo considers using Decadron and a 13-hour prep.  jkl  . Nerve Block Tray Anaphylaxis    Patient states that she was sent to have Epidural for her shoulder. After rec'ving she remembers saying that she was itching all over  And that's it. She was transported to hospital  By EMS. Stayed overnight.  [place]ord Spikes Derivatives Hives and Nausea And Vomiting  . Triamcinolone Acetonide Anaphylaxis    08-12-10  Pt had severe reaction with itching/hives/loss of consciousness within five minutes of CEPI today.  She received both Kenalog and Iodinated Contrast, so not certain which agent caused reaction.  If pt returns for another cervical epidural steroid injection, Dr. Jobe Igo suggests using Decadron and a 13-hr  prep.  JKL PATIENT USES PROCTOCREAM 2.5% WITHOUT DIFFICULTY FOR VULVAR DYSTROPHY Patient says she can take prednisone without any problem  . Vibra-Tab [Doxycycline] Nausea And Vomiting    Social History   Socioeconomic History  . Marital status: Married    Spouse name: Not on file  . Number of children: Not on file  . Years of education: Not on file  . Highest education level: Not on file  Occupational History  . Not on file  Tobacco Use  . Smoking status: Never Smoker  . Smokeless tobacco: Never Used  Vaping Use  . Vaping Use: Never used  Substance and Sexual Activity  . Alcohol use: No  . Drug use: No  . Sexual activity: Never  Other Topics Concern  . Not on file  Social History Narrative   Nonsmoker.  She lives alone.  Her son calls her every day to check on her.  Has a daughter involved in care  as well.     Social Determinants of Health   Financial Resource Strain: Not on file  Food Insecurity: Not on file  Transportation Needs: Not on file  Physical Activity: Not on file  Stress: Not on file  Social Connections: Not on file  Intimate Partner Violence: Not on file     ROS- All systems are reviewed and negative except as per the HPI above.  Physical Exam: Vitals:   02/19/20 1036  BP: (!) 94/46  Pulse: (!) 121  Weight: 52.7 kg  Height: 5\' 2"  (1.575 m)    GEN- The patient is well appearing elderly female, alert and oriented x 3 today.   Head- normocephalic, atraumatic Eyes-  Sclera clear, conjunctiva pink Ears- hearing intact Oropharynx- clear Neck- supple  Lungs- Clear to ausculation bilaterally, diminished breath sounds, normal work of breathing Heart- irregular rate and rhythm, no murmurs, rubs or gallops  GI- soft, NT, ND, + BS Extremities- no clubbing, cyanosis, or edema MS- no significant deformity or atrophy Skin- no rash or lesion Psych- euthymic mood, full affect Neuro- strength and sensation are intact  Wt Readings from Last 3 Encounters:   02/19/20 52.7 kg  02/15/20 52.2 kg  01/26/20 52.2 kg    EKG today demonstrates  Afib  Vent. rate 121 BPM QRS duration 72 ms QT/QTc 286/406 ms  Echo 09/29/19 demonstrated  1. Left ventricular ejection fraction, by estimation, is 70 to 75%. The  left ventricle has normal function. The left ventricle has no regional  wall motion abnormalities. Left ventricular diastolic parameters are  indeterminate.  2. Right ventricular systolic function is normal. The right ventricular  size is normal.  3. Left atrial size was mildly dilated.  4. The mitral valve is normal in structure. Trivial mitral valve  regurgitation. No evidence of mitral stenosis.  5. The aortic valve is tricuspid. Aortic valve regurgitation is mild. No  aortic stenosis is present.  6. The inferior vena cava is normal in size with greater than 50%  respiratory variability, suggesting right atrial pressure of 3 mmHg.   Epic records are reviewed at length today  CHA2DS2-VASc Score = 6  The patient's score is based upon: CHF History: No HTN History: Yes Diabetes History: No Stroke History: Yes Vascular Disease History: No Age Score: 2 Gender Score: 1      ASSESSMENT AND PLAN: 1. Persistent Atrial Fibrillation (ICD10:  I48.19) The patient's CHA2DS2-VASc score is 6, indicating a 9.7% annual risk of stroke.   S/p DCCV on 02/16/20 with early return of afib. ? Related to recent steroid use, she will finish taper in ~ 3 days.  We discussed therapeutic options today including AAD (Multaq, dofetilide). Would avoid amiodarone given her ILD. She would like to avoid another hospitalization if possible. Will start Multaq 400 mg BID with plan for repeat DCCV if she does not convert. If medication is cost prohibitive or fails to keep her in SR, patient agreeable to consider dofetilide admission.  Recent lab work reviewed.  Continue Eliquis 2.5 mg BID (age, weight) ED precautions reviewed with patient if she should have  clinical deterioration in the interim.   2. Secondary Hypercoagulable State (ICD10:  D68.69) The patient is at significant risk for stroke/thromboembolism based upon her CHA2DS2-VASc Score of 6.  Continue Apixaban (Eliquis).    Follow up in the AF clinic Thursday for ECG.    [facility] [address] [place], [place] [address] [phone] 02/19/2020 11:24 AM

## 2020-02-19 NOTE — Addendum Note (Signed)
Encounter addended by: Oliver Barre, PA on: 02/19/2020 11:59 AM  Actions taken: Clinical Note Signed

## 2020-02-20 ENCOUNTER — Ambulatory Visit (HOSPITAL_COMMUNITY): Payer: Medicare Other | Admitting: Physician Assistant

## 2020-02-22 ENCOUNTER — Encounter (HOSPITAL_COMMUNITY): Payer: Medicare Other | Admitting: Physician Assistant

## 2020-02-22 ENCOUNTER — Other Ambulatory Visit (HOSPITAL_COMMUNITY)
Admission: RE | Admit: 2020-02-22 | Discharge: 2020-02-22 | Disposition: A | Discharge status: Home / Self Care | Payer: Medicare Other | Source: Ambulatory Visit | Attending: Cardiology | Admitting: Cardiology

## 2020-02-22 ENCOUNTER — Ambulatory Visit (HOSPITAL_COMMUNITY)
Admission: RE | Admit: 2020-02-22 | Discharge: 2020-02-22 | Disposition: A | Discharge status: Home / Self Care | Payer: Medicare Other | Source: Ambulatory Visit | Attending: Physician Assistant | Admitting: Physician Assistant

## 2020-02-22 ENCOUNTER — Other Ambulatory Visit: Payer: Self-pay

## 2020-02-22 VITALS — BP 102/66 | HR 103

## 2020-02-22 DIAGNOSIS — G894 Chronic pain syndrome: Secondary | ICD-10-CM | POA: Diagnosis not present

## 2020-02-22 DIAGNOSIS — Z20822 Contact with and (suspected) exposure to covid-19: Secondary | ICD-10-CM | POA: Insufficient documentation

## 2020-02-22 DIAGNOSIS — I4819 Other persistent atrial fibrillation: Secondary | ICD-10-CM

## 2020-02-22 DIAGNOSIS — B0229 Other postherpetic nervous system involvement: Secondary | ICD-10-CM | POA: Diagnosis not present

## 2020-02-22 DIAGNOSIS — I4891 Unspecified atrial fibrillation: Secondary | ICD-10-CM | POA: Insufficient documentation

## 2020-02-22 DIAGNOSIS — Z79899 Other long term (current) drug therapy: Secondary | ICD-10-CM | POA: Diagnosis not present

## 2020-02-22 DIAGNOSIS — D6869 Other thrombophilia: Secondary | ICD-10-CM

## 2020-02-22 DIAGNOSIS — M1991 Primary osteoarthritis, unspecified site: Secondary | ICD-10-CM | POA: Diagnosis not present

## 2020-02-22 DIAGNOSIS — Z01812 Encounter for preprocedural laboratory examination: Secondary | ICD-10-CM | POA: Insufficient documentation

## 2020-02-22 DIAGNOSIS — M5137 Other intervertebral disc degeneration, lumbosacral region: Secondary | ICD-10-CM | POA: Diagnosis not present

## 2020-02-22 LAB — SARS CORONAVIRUS 2 (TAT 6-24 HRS): SARS Coronavirus 2: NEGATIVE

## 2020-02-22 NOTE — Progress Notes (Signed)
Patient returns for ECG after starting Multaq. ECG shows afib HR 103, QRS 76, QTc 448. Her heart rate has improved. Will plan for DCCV and follow up in the AF clinic one week after. Recent lab work reviewed.

## 2020-02-22 NOTE — Patient Instructions (Signed)
Cardioversion scheduled for Monday, February 14th             - Arrive at the [facility] and go to admitting at [facility] not eat or drink anything after midnight the night prior to your procedure.             - Take all your morning medication (except diabetic medications) with a sip of water prior to arrival.             - You will not be able to drive home after your procedure.             - Do NOT miss any doses of your blood thinner - if you should miss a dose please notify our office immediately.             - If you feel as if you go back into normal rhythm prior to scheduled cardioversion, please notify our office immediately. If your procedure is canceled in the cardioversion suite you will be charged a cancellation fee.

## 2020-02-23 ENCOUNTER — Ambulatory Visit (HOSPITAL_COMMUNITY): Payer: Medicare Other | Admitting: Nurse Practitioner

## 2020-02-23 DIAGNOSIS — J42 Unspecified chronic bronchitis: Secondary | ICD-10-CM | POA: Diagnosis not present

## 2020-02-23 DIAGNOSIS — R5381 Other malaise: Secondary | ICD-10-CM | POA: Diagnosis not present

## 2020-02-26 ENCOUNTER — Ambulatory Visit (HOSPITAL_COMMUNITY): Payer: Medicare Other | Admitting: Anesthesiology

## 2020-02-26 ENCOUNTER — Encounter (HOSPITAL_COMMUNITY)
Admission: RE | Disposition: A | Discharge status: Home / Self Care | Payer: Self-pay | Source: Home / Self Care | Attending: Cardiology

## 2020-02-26 ENCOUNTER — Encounter (HOSPITAL_COMMUNITY): Payer: Self-pay | Admitting: Cardiology

## 2020-02-26 ENCOUNTER — Ambulatory Visit (HOSPITAL_COMMUNITY)
Admission: RE | Admit: 2020-02-26 | Discharge: 2020-02-26 | Disposition: A | Discharge status: Home / Self Care | Payer: Medicare Other | Attending: Cardiology | Admitting: Cardiology

## 2020-02-26 ENCOUNTER — Other Ambulatory Visit: Payer: Self-pay

## 2020-02-26 ENCOUNTER — Telehealth: Payer: Self-pay

## 2020-02-26 DIAGNOSIS — Z881 Allergy status to other antibiotic agents status: Secondary | ICD-10-CM | POA: Diagnosis not present

## 2020-02-26 DIAGNOSIS — I10 Essential (primary) hypertension: Secondary | ICD-10-CM | POA: Diagnosis not present

## 2020-02-26 DIAGNOSIS — E785 Hyperlipidemia, unspecified: Secondary | ICD-10-CM | POA: Diagnosis not present

## 2020-02-26 DIAGNOSIS — Z91041 Radiographic dye allergy status: Secondary | ICD-10-CM | POA: Insufficient documentation

## 2020-02-26 DIAGNOSIS — I482 Chronic atrial fibrillation, unspecified: Secondary | ICD-10-CM | POA: Diagnosis not present

## 2020-02-26 DIAGNOSIS — I4819 Other persistent atrial fibrillation: Secondary | ICD-10-CM

## 2020-02-26 DIAGNOSIS — J849 Interstitial pulmonary disease, unspecified: Secondary | ICD-10-CM | POA: Insufficient documentation

## 2020-02-26 DIAGNOSIS — Z8673 Personal history of transient ischemic attack (TIA), and cerebral infarction without residual deficits: Secondary | ICD-10-CM | POA: Insufficient documentation

## 2020-02-26 DIAGNOSIS — Z7901 Long term (current) use of anticoagulants: Secondary | ICD-10-CM | POA: Insufficient documentation

## 2020-02-26 DIAGNOSIS — M069 Rheumatoid arthritis, unspecified: Secondary | ICD-10-CM | POA: Insufficient documentation

## 2020-02-26 DIAGNOSIS — I4891 Unspecified atrial fibrillation: Secondary | ICD-10-CM | POA: Diagnosis not present

## 2020-02-26 DIAGNOSIS — Z79899 Other long term (current) drug therapy: Secondary | ICD-10-CM | POA: Insufficient documentation

## 2020-02-26 DIAGNOSIS — K219 Gastro-esophageal reflux disease without esophagitis: Secondary | ICD-10-CM | POA: Diagnosis not present

## 2020-02-26 HISTORY — PX: CARDIOVERSION: SHX1299

## 2020-02-26 SURGERY — CARDIOVERSION
Anesthesia: General

## 2020-02-26 MED ORDER — SODIUM CHLORIDE 0.9 % IV SOLN: INTRAVENOUS | Status: DC | PRN

## 2020-02-26 MED ORDER — LIDOCAINE 2% (20 MG/ML) 5 ML SYRINGE
INTRAMUSCULAR | Status: DC | PRN
  Administered 2020-02-26: 40 mg via INTRAVENOUS

## 2020-02-26 MED ORDER — PROPOFOL 10 MG/ML IV BOLUS
INTRAVENOUS | Status: DC | PRN
  Administered 2020-02-26: 30 mg via INTRAVENOUS
  Administered 2020-02-26: 10 mg via INTRAVENOUS

## 2020-02-26 NOTE — Interval H&P Note (Signed)
History and Physical Interval Note:  02/26/2020 10:13 AM  [name]  has presented today for surgery, with the diagnosis of AFIB.  The various methods of treatment have been discussed with the patient and family. After consideration of risks, benefits and other options for treatment, the patient has consented to  Procedure(s): CARDIOVERSION (N/A) as a surgical intervention.  The patient's history has been reviewed, patient examined, no change in status, stable for surgery.  I have reviewed the patient's chart and labs.  Questions were answered to the patient's satisfaction.     Fransico Him

## 2020-02-26 NOTE — Anesthesia Postprocedure Evaluation (Signed)
Anesthesia Post Note  Patient: [name]  Procedure(s) Performed: CARDIOVERSION (N/A )     Patient location during evaluation: Endoscopy Anesthesia Type: General Level of consciousness: awake and alert Pain management: pain level controlled Vital Signs Assessment: post-procedure vital signs reviewed and stable Respiratory status: spontaneous breathing, nonlabored ventilation, respiratory function stable and patient connected to nasal cannula oxygen Cardiovascular status: blood pressure returned to baseline and stable Postop Assessment: no apparent nausea or vomiting Anesthetic complications: no   No complications documented.  Last Vitals:  Vitals:   02/26/20 1041 02/26/20 1044  BP:  (!) 104/51  Pulse: 72 76  Resp: (!) 27 (!) 24  Temp:    SpO2: 97% 93%    Last Pain:  Vitals:   02/26/20 1044  TempSrc:   PainSc: 0-No pain   Pain Goal:                   Cherolyn Behrle L Yamilett Anastos

## 2020-02-26 NOTE — CV Procedure (Signed)
   Electrical Cardioversion Procedure Note [name] [id number] 02-Jan-1934  Procedure: Electrical Cardioversion Indications:  Atrial Fibrillation  Time Out: Verified patient identification, verified procedure,medications/allergies/relevent history reviewed, required imaging and test results available.  Performed  Procedure Details  The patient was NPO after midnight. Anesthesia was administered at the beside  by Dr.Woodrum with 40mg  of propofol and 40mg  Lidocaine.  Cardioversion was done with synchronized biphasic defibrillation with AP pads with 150 watts.  The patient converted to normal sinus rhythm. The patient tolerated the procedure well   IMPRESSION:  Successful cardioversion of atrial fibrillation    [name] 02/26/2020, 10:13 AM

## 2020-02-26 NOTE — Discharge Instructions (Signed)
Electrical Cardioversion Electrical cardioversion is the delivery of a jolt of electricity to restore a normal rhythm to the heart. A rhythm that is too fast or is not regular keeps the heart from pumping well. In this procedure, sticky patches or metal paddles are placed on the chest to deliver electricity to the heart from a device. This procedure may be done in an emergency if:  There is low or no blood pressure as a result of the heart rhythm.  Normal rhythm must be restored as fast as possible to protect the brain and heart from further damage.  It may save a life. This may also be a scheduled procedure for irregular or fast heart rhythms that are not immediately life-threatening. Tell a health care provider about:  Any allergies you have.  All medicines you are taking, including vitamins, herbs, eye drops, creams, and over-the-counter medicines.  Any problems you or family members have had with anesthetic medicines.  Any blood disorders you have.  Any surgeries you have had.  Any medical conditions you have.  Whether you are pregnant or may be pregnant. What are the risks? Generally, this is a safe procedure. However, problems may occur, including:  Allergic reactions to medicines.  A blood clot that breaks free and travels to other parts of your body.  The possible return of an abnormal heart rhythm within hours or days after the procedure.  Your heart stopping (cardiac arrest). This is rare. What happens before the procedure? Medicines  Your health care provider may have you start taking: ? Blood-thinning medicines (anticoagulants) so your blood does not clot as easily. ? Medicines to help stabilize your heart rate and rhythm.  Ask your health care provider about: ? Changing or stopping your regular medicines. This is especially important if you are taking diabetes medicines or blood thinners. ? Taking medicines such as aspirin and ibuprofen. These medicines can  thin your blood. Do not take these medicines unless your health care provider tells you to take them. ? Taking over-the-counter medicines, vitamins, herbs, and supplements. General instructions  Follow instructions from your health care provider about eating or drinking restrictions.  Plan to have someone take you home from the hospital or clinic.  If you will be going home right after the procedure, plan to have someone with you for 24 hours.  Ask your health care provider what steps will be taken to help prevent infection. These may include washing your skin with a germ-killing soap. What happens during the procedure?  An IV will be inserted into one of your veins.  Sticky patches (electrodes) or metal paddles may be placed on your chest.  You will be given a medicine to help you relax (sedative).  An electrical shock will be delivered. The procedure may vary among health care providers and hospitals.   What can I expect after the procedure?  Your blood pressure, heart rate, breathing rate, and blood oxygen level will be monitored until you leave the hospital or clinic.  Your heart rhythm will be watched to make sure it does not change.  You may have some redness on the skin where the shocks were given. Follow these instructions at home:  Do not drive for 24 hours if you were given a sedative during your procedure.  Take over-the-counter and prescription medicines only as told by your health care provider.  Ask your health care provider how to check your pulse. Check it often.  Rest for 48 hours after the procedure   or as told by your health care provider.  Avoid or limit your caffeine use as told by your health care provider.  Keep all follow-up visits as told by your health care provider. This is important. Contact a health care provider if:  You feel like your heart is beating too quickly or your pulse is not regular.  You have a serious muscle cramp that does not go  away. Get help right away if:  You have discomfort in your chest.  You are dizzy or you feel faint.  You have trouble breathing or you are short of breath.  Your speech is slurred.  You have trouble moving an arm or leg on one side of your body.  Your fingers or toes turn cold or blue. Summary  Electrical cardioversion is the delivery of a jolt of electricity to restore a normal rhythm to the heart.  This procedure may be done right away in an emergency or may be a scheduled procedure if the condition is not an emergency.  Generally, this is a safe procedure.  After the procedure, check your pulse often as told by your health care provider. This information is not intended to replace advice given to you by your health care provider. Make sure you discuss any questions you have with your health care provider. Document Revised: 08/01/2018 Document Reviewed: 08/01/2018 Elsevier Patient Education  2021 [employer]  

## 2020-02-26 NOTE — Anesthesia Procedure Notes (Signed)
Procedure Name: General with mask airway Date/Time: 02/26/2020 10:20 AM Performed by: Imagene Riches, CRNA Pre-anesthesia Checklist: Patient identified, Emergency Drugs available, Suction available, Patient being monitored and Timeout performed Oxygen Delivery Method: Ambu bag Preoxygenation: Pre-oxygenation with 100% oxygen

## 2020-02-26 NOTE — Anesthesia Preprocedure Evaluation (Addendum)
Anesthesia Evaluation  Patient identified by MRN, date of birth, ID band Patient awake    Reviewed: Allergy & Precautions, NPO status , Patient's Chart, lab work & pertinent test results, reviewed documented beta blocker date and time   Airway Mallampati: II  TM Distance: >3 FB Neck ROM: Full    Dental  (+) Edentulous Upper, Dental Advisory Given   Pulmonary shortness of breath, with exertion, at rest and Long-Term Oxygen Therapy, asthma , COPD (2L o2),  COPD inhaler and oxygen dependent,    Pulmonary exam normal breath sounds clear to auscultation       Cardiovascular hypertension, Pt. on home beta blockers and Pt. on medications Normal cardiovascular exam+ dysrhythmias (on eliquis) Atrial Fibrillation  Rhythm:Irregular Rate:Normal  TTE 2021 normal EF, mild AI   Neuro/Psych TIACVA negative psych ROS   GI/Hepatic Neg liver ROS, GERD  ,  Endo/Other  negative endocrine ROS  Renal/GU negative Renal ROS  negative genitourinary   Musculoskeletal  (+) Arthritis , Rheumatoid disorders,    Abdominal   Peds  Hematology negative hematology ROS (+)   Anesthesia Other Findings   Reproductive/Obstetrics                          Anesthesia Physical Anesthesia Plan  ASA: III  Anesthesia Plan: General   Post-op Pain Management:    Induction: Intravenous  PONV Risk Score and Plan: 3 and Propofol infusion  Airway Management Planned: Mask and Natural Airway  Additional Equipment:   Intra-op Plan:   Post-operative Plan:   Informed Consent: I have reviewed the patients History and Physical, chart, labs and discussed the procedure including the risks, benefits and alternatives for the proposed anesthesia with the patient or authorized representative who has indicated his/her understanding and acceptance.     Dental advisory given  Plan Discussed with: CRNA  Anesthesia Plan Comments:          Anesthesia Quick Evaluation

## 2020-02-26 NOTE — Transfer of Care (Signed)
Immediate Anesthesia Transfer of Care Note  Patient: [name]  Procedure(s) Performed: CARDIOVERSION (N/A )  Patient Location: Endoscopy Unit  Anesthesia Type:General  Level of Consciousness: drowsy  Airway & Oxygen Therapy: Patient Spontanous Breathing and Patient connected to nasal cannula oxygen  Post-op Assessment: Report given to RN and Post -op Vital signs reviewed and stable  Post vital signs: Reviewed and stable  Last Vitals:  Vitals Value Taken Time  BP    Temp    Pulse    Resp    SpO2      Last Pain:  Vitals:   02/26/20 0922  TempSrc: Temporal  PainSc: 0-No pain         Complications: No complications documented.

## 2020-02-26 NOTE — Telephone Encounter (Signed)
Spoke with patient and patient's son Simona Huh and scheduled an in-person Palliative Consult for 03/12/20 @ 9AM  COVID screening was negative. No pets in home. Patient lives alone. Her 4 children take turns staying and helping around the home.   Consent obtained; updated Outlook/Netsmart/Team List and Epic.  Family is aware they will be receiving a call from NP the day before or day of to confirm appointment.

## 2020-02-28 ENCOUNTER — Encounter (HOSPITAL_COMMUNITY): Payer: Self-pay | Admitting: Cardiology

## 2020-03-05 ENCOUNTER — Ambulatory Visit (HOSPITAL_COMMUNITY)
Admission: RE | Admit: 2020-03-05 | Discharge: 2020-03-05 | Disposition: A | Discharge status: Home / Self Care | Payer: Medicare Other | Source: Ambulatory Visit | Attending: Physician Assistant | Admitting: Physician Assistant

## 2020-03-05 ENCOUNTER — Other Ambulatory Visit: Payer: Self-pay

## 2020-03-05 ENCOUNTER — Encounter (HOSPITAL_COMMUNITY): Payer: Self-pay | Admitting: Physician Assistant

## 2020-03-05 VITALS — BP 136/54 | HR 115 | Ht 62.0 in | Wt 115.0 lb

## 2020-03-05 DIAGNOSIS — M069 Rheumatoid arthritis, unspecified: Secondary | ICD-10-CM | POA: Insufficient documentation

## 2020-03-05 DIAGNOSIS — J849 Interstitial pulmonary disease, unspecified: Secondary | ICD-10-CM | POA: Diagnosis not present

## 2020-03-05 DIAGNOSIS — I4819 Other persistent atrial fibrillation: Secondary | ICD-10-CM | POA: Insufficient documentation

## 2020-03-05 DIAGNOSIS — Z7901 Long term (current) use of anticoagulants: Secondary | ICD-10-CM | POA: Diagnosis not present

## 2020-03-05 DIAGNOSIS — Z8673 Personal history of transient ischemic attack (TIA), and cerebral infarction without residual deficits: Secondary | ICD-10-CM | POA: Insufficient documentation

## 2020-03-05 DIAGNOSIS — Z79899 Other long term (current) drug therapy: Secondary | ICD-10-CM | POA: Diagnosis not present

## 2020-03-05 DIAGNOSIS — Z7952 Long term (current) use of systemic steroids: Secondary | ICD-10-CM | POA: Insufficient documentation

## 2020-03-05 DIAGNOSIS — D6869 Other thrombophilia: Secondary | ICD-10-CM | POA: Insufficient documentation

## 2020-03-05 MED ORDER — DILTIAZEM HCL ER COATED BEADS 120 MG PO CP24: 120.0000 mg | ORAL_CAPSULE | Freq: Every day | ORAL | 3 refills | Status: DC

## 2020-03-05 NOTE — Progress Notes (Signed)
Primary Care Physician: Sharilyn Sites, MD Primary Cardiologist: Dr Harl Bowie Primary Electrophysiologist: none Referring Physician: Dr Marlowe Alt is a 87 y.o. female with a history of atrial fibrillation, prior CVA, interstitial lung disease, rheumatoid arthritis who presents for consultation in the [facility]. The patient was initially diagnosed with atrial fibrillation remotely and has been on Eliquis for a CHADS2VASC score of 6. She underwent DCCV on 09/2019 and had recurrent rapid afib on 02/15/20 and underwent DCCV at that time. Unfortunately, she feels she was back out of rhythm 02/18/20. She had been on a prednisone taper for her RA.   On follow up today, patient is s/p Multaq initiation with repeat DCCV on 02/26/20. She noted she was back in afib 4 days later. She is in atrial flutter today. However, she states she feels her strength is improving despite being out of rhythm. She denies any bleeding issues on anticoagulation.   Today, she denies symptoms of palpitations, chest pain, orthopnea, PND, lower extremity edema, dizziness, presyncope, syncope, snoring, daytime somnolence, bleeding, or neurologic sequela. The patient is tolerating medications without difficulties and is otherwise without complaint today.    Atrial Fibrillation Risk Factors:  she does not have symptoms or diagnosis of sleep apnea. she does not have a history of rheumatic fever. she does not have a history of alcohol use. The patient does not have a history of early familial atrial fibrillation or other arrhythmias.  she has a BMI of Body mass index is 21.03 kg/m.Marland Kitchen Filed Weights   03/05/20 1055  Weight: 52.2 kg    Family History  Problem Relation Age of Onset  . Rheum arthritis Mother   . Parkinsonism Father   . Hiatal hernia Sister   . Healthy Brother   . Healthy Daughter   . Healthy Son   . Healthy Son      Atrial Fibrillation Management  history:  Previous antiarrhythmic drugs: Multaq Previous cardioversions: 09/2019, 02/16/20, 02/26/20 Previous ablations: none CHADS2VASC score: 6 Anticoagulation history: Eliquis   Past Medical History:  Diagnosis Date  . A-fib ([place])   . Atrial fibrillation ([place])   . Cervicalgia   . Chronic bronchitis with COPD (chronic obstructive pulmonary disease) ([place])   . Colon cancer ([place])   . Degeneration of cervical intervertebral disc   . Hypertension   . Pinched nerve in shoulder   . Rheumatoid arthritis(714.0)   . RUQ pain   . Vulvar dystrophy   . Yeast infection    Past Surgical History:  Procedure Laterality Date  . ABDOMINAL HYSTERECTOMY  1960'S  . APPENDECTOMY  with hysterectomy  . BACK SURGERY  12/08/10   plate/screws C3 C4  . CARDIOVERSION N/A 09/29/2019   Procedure: CARDIOVERSION;  Surgeon: Arnoldo Lenis, MD;  Location: [facility] ENDO SU[facility];  Service: Endoscopy;  Laterality: N/A;  pt ate early this AM per Dr. Harl Bowie  . CARDIOVERSION N/A 02/26/2020   Procedure: CARDIOVERSION;  Surgeon: Sueanne Margarita, MD;  Location: [facility] ENDOSCOPY;  Service: Cardiovascular;  Laterality: N/A;  . CHOLECYSTECTOMY  2002  . COLONOSCOPY  04/02/2009  . COLONOSCOPY  06/04/05  . COLONOSCOPY N/A 07/26/2015   Procedure: COLONOSCOPY;  Surgeon: Rogene Houston, MD;  Location: [facility] ENDO SUITE;  Service: Endoscopy;  Laterality: N/A;  1240  . COLONOSCOPY WITH ESOPHAGOGASTRODUODENOSCOPY (EGD) N/A 04/08/2012   Procedure: COLONOSCOPY WITH ESOPHAGOGASTRODUODENOSCOPY (EGD);  Surgeon: Rogene Houston, MD;  Location: [facility];  Service: Endoscopy;  Laterality: N/A;  130-moved  to [place] notified pt  . ESOPHAGOGASTRODUODENOSCOPY N/A 07/26/2015   Procedure: ESOPHAGOGASTRODUODENOSCOPY (EGD);  Surgeon: Rogene Houston, MD;  Location: [facility];  Service: Endoscopy;  Laterality: N/A;  . HEMICOLECTOMY  10/05    Current Outpatient Medications  Medication Sig Dispense Refill  . apixaban (ELIQUIS) 2.5 MG TABS tablet Take  1 tablet (2.5 mg total) by mouth 2 (two) times daily. 60 tablet 0  . cholecalciferol (VITAMIN D3) 25 MCG (1000 UT) tablet Take 2,000 Units by mouth 2 (two) times daily.     Marland Kitchen dronedarone (MULTAQ) 400 MG tablet Take 1 tablet (400 mg total) by mouth 2 (two) times daily with a meal. 60 tablet 2  . HYDROcodone-acetaminophen (NORCO) 7.5-325 MG tablet Take 1 tablet by mouth 3 (three) times daily as needed for moderate pain. 15 tablet 0  . leflunomide (ARAVA) 20 MG tablet Take 1 tablet (20 mg total) by mouth daily. 30 tablet 0  . metoprolol succinate (TOPROL-XL) 100 MG 24 hr tablet Take 100 mg by mouth daily.    . Omega-3 Fatty Acids (FISH OIL) 1000 MG CAPS Take 1 capsule by mouth daily.    Marland Kitchen omeprazole (PRILOSEC) 20 MG capsule Take 1 capsule (20 mg total) by mouth in the morning and at bedtime.    Vladimir Faster Glycol-Propyl Glycol (SYSTANE) 0.4-0.3 % SOLN Apply 2 drops to eye daily as needed (dry eye).    . predniSONE (DELTASONE) 10 MG tablet Take 10 mg by mouth See admin instructions. Take 5 tablets by mouth for 3 days, 4 tabs for 3 days, 3 tabs for 3 days, 2 tabs for 3 days and 1 tab for 3 days.    . rosuvastatin (CRESTOR) 20 MG tablet Take 1 tablet (20 mg total) by mouth daily at 6 PM. 30 tablet 0   No current facility-administered medications for this encounter.    Allergies  Allergen Reactions  . Iodinated Diagnostic Agents Anaphylaxis    08-12-10 Pt had severe reaction with itching/hives/loss of consciousness within 5 minutes of CEPI today.  She received both Kenalog and Iondinated Contrast, so not certain which agent caused reaction.  If patient returns for another CEPI, Dr. Jobe Igo considers using Decadron and a 13-hour prep.  jkl  . Nerve Block Tray Anaphylaxis    Patient states that she was sent to have Epidural for her shoulder. After rec'ving she remembers saying that she was itching all over  And that's it. She was transported to hospital  By EMS. Stayed overnight.  [place] Spikes Derivatives  Hives and Nausea And Vomiting  . Triamcinolone Acetonide Anaphylaxis    08-12-10  Pt had severe reaction with itching/hives/loss of consciousness within five minutes of CEPI today.  She received both Kenalog and Iodinated Contrast, so not certain which agent caused reaction.  If pt returns for another cervical epidural steroid injection, Dr. Jobe Igo suggests using Decadron and a 13-hr prep.  JKL PATIENT USES PROCTOCREAM 2.5% WITHOUT DIFFICULTY FOR VULVAR DYSTROPHY Patient says she can take prednisone without any problem  . Vibra-Tab [Doxycycline] Nausea And Vomiting    Social History   Socioeconomic History  . Marital status: Married    Spouse name: Not on file  . Number of children: Not on file  . Years of education: Not on file  . Highest education level: Not on file  Occupational History  . Not on file  Tobacco Use  . Smoking status: Never Smoker  . Smokeless tobacco: Never Used  Vaping Use  . Vaping Use:  Never used  Substance and Sexual Activity  . Alcohol use: No  . Drug use: No  . Sexual activity: Never  Other Topics Concern  . Not on file  Social History Narrative   Nonsmoker.  She lives alone.  Her son calls her every day to check on her.  Has a daughter involved in care as well.     Social Determinants of Health   Financial Resource Strain: Not on file  Food Insecurity: Not on file  Transportation Needs: Not on file  Physical Activity: Not on file  Stress: Not on file  Social Connections: Not on file  Intimate Partner Violence: Not on file     ROS- All systems are reviewed and negative except as per the HPI above.  Physical Exam: Vitals:   03/05/20 1055  BP: (!) 136/54  Pulse: (!) 115  Weight: 52.2 kg  Height: 5\' 2"  (1.575 m)    GEN- The patient is a frail appearing elderly female, alert and oriented x 3 today.   HEENT-head normocephalic, atraumatic, sclera clear, conjunctiva pink, hearing intact, trachea midline. Lungs- Clear to ausculation  bilaterally, mildly labored work of breathing Heart- irregular rate and rhythm, no murmurs, rubs or gallops  GI- soft, NT, ND, + BS Extremities- no clubbing, cyanosis, or edema MS- no significant deformity or atrophy Skin- no rash or lesion Psych- euthymic mood, full affect Neuro- strength and sensation are intact   Wt Readings from Last 3 Encounters:  03/05/20 52.2 kg  02/26/20 51.3 kg  02/19/20 52.7 kg    EKG today demonstrates  Typical atrial flutter with variable conduction  Vent. rate 115 BPM QRS duration 68 ms QT/QTc 310/428 ms  Echo 09/29/19 demonstrated  1. Left ventricular ejection fraction, by estimation, is 70 to 75%. The  left ventricle has normal function. The left ventricle has no regional  wall motion abnormalities. Left ventricular diastolic parameters are  indeterminate.  2. Right ventricular systolic function is normal. The right ventricular  size is normal.  3. Left atrial size was mildly dilated.  4. The mitral valve is normal in structure. Trivial mitral valve  regurgitation. No evidence of mitral stenosis.  5. The aortic valve is tricuspid. Aortic valve regurgitation is mild. No  aortic stenosis is present.  6. The inferior vena cava is normal in size with greater than 50%  respiratory variability, suggesting right atrial pressure of 3 mmHg.   Epic records are reviewed at length today  CHA2DS2-VASc Score = 6  The patient's score is based upon: CHF History: No HTN History: Yes Diabetes History: No Stroke History: Yes Vascular Disease History: No Age Score: 2 Gender Score: 1      ASSESSMENT AND PLAN: 1. Persistent Atrial Fibrillation/atrial flutter The patient's CHA2DS2-VASc score is 6, indicating a 9.7% annual risk of stroke.   S/p DCCV on 02/26/20 We discussed therapeutic options today. Would avoid amiodarone given her ILD. She would like to avoid another hospitalization.  At this time, patient and her family would like a conservative  rate control approach. Will stop Multaq. Start diltiazem 120 mg daily. Continue Eliquis 2.5 mg BID (age, weight)  2. Secondary Hypercoagulable State (ICD10:  D68.69) The patient is at significant risk for stroke/thromboembolism based upon her CHA2DS2-VASc Score of 6.  Continue Apixaban (Eliquis).    Follow up in the AF clinic in 2 weeks.    [facility] [address] [place], [place] [address] [phone] 03/05/2020 11:08 AM

## 2020-03-05 NOTE — Patient Instructions (Signed)
Stop multaq  Start Cardizem (Diltiazem) 120mg  once a day at bedtime

## 2020-03-07 DIAGNOSIS — H01002 Unspecified blepharitis right lower eyelid: Secondary | ICD-10-CM | POA: Diagnosis not present

## 2020-03-07 DIAGNOSIS — H353132 Nonexudative age-related macular degeneration, bilateral, intermediate dry stage: Secondary | ICD-10-CM | POA: Diagnosis not present

## 2020-03-07 DIAGNOSIS — H02834 Dermatochalasis of left upper eyelid: Secondary | ICD-10-CM | POA: Diagnosis not present

## 2020-03-07 DIAGNOSIS — H01001 Unspecified blepharitis right upper eyelid: Secondary | ICD-10-CM | POA: Diagnosis not present

## 2020-03-07 DIAGNOSIS — H01004 Unspecified blepharitis left upper eyelid: Secondary | ICD-10-CM | POA: Diagnosis not present

## 2020-03-07 DIAGNOSIS — H26493 Other secondary cataract, bilateral: Secondary | ICD-10-CM | POA: Diagnosis not present

## 2020-03-07 DIAGNOSIS — Z961 Presence of intraocular lens: Secondary | ICD-10-CM | POA: Diagnosis not present

## 2020-03-07 DIAGNOSIS — H01005 Unspecified blepharitis left lower eyelid: Secondary | ICD-10-CM | POA: Diagnosis not present

## 2020-03-07 DIAGNOSIS — H02831 Dermatochalasis of right upper eyelid: Secondary | ICD-10-CM | POA: Diagnosis not present

## 2020-03-11 DIAGNOSIS — Z6822 Body mass index (BMI) 22.0-22.9, adult: Secondary | ICD-10-CM | POA: Diagnosis not present

## 2020-03-11 DIAGNOSIS — E7849 Other hyperlipidemia: Secondary | ICD-10-CM | POA: Diagnosis not present

## 2020-03-11 DIAGNOSIS — R61 Generalized hyperhidrosis: Secondary | ICD-10-CM | POA: Diagnosis not present

## 2020-03-11 DIAGNOSIS — I480 Paroxysmal atrial fibrillation: Secondary | ICD-10-CM | POA: Diagnosis not present

## 2020-03-11 DIAGNOSIS — L0291 Cutaneous abscess, unspecified: Secondary | ICD-10-CM | POA: Diagnosis not present

## 2020-03-12 ENCOUNTER — Other Ambulatory Visit: Payer: Self-pay

## 2020-03-12 ENCOUNTER — Ambulatory Visit (HOSPITAL_COMMUNITY)
Admission: RE | Admit: 2020-03-12 | Discharge: 2020-03-12 | Disposition: A | Discharge status: Home / Self Care | Payer: Medicare Other | Source: Ambulatory Visit | Attending: Physician Assistant | Admitting: Physician Assistant

## 2020-03-12 ENCOUNTER — Encounter (HOSPITAL_COMMUNITY): Payer: Self-pay | Admitting: Physician Assistant

## 2020-03-12 ENCOUNTER — Other Ambulatory Visit: Payer: Self-pay | Admitting: Nurse Practitioner

## 2020-03-12 VITALS — BP 98/52 | HR 141 | Ht 62.0 in | Wt 116.8 lb

## 2020-03-12 DIAGNOSIS — I351 Nonrheumatic aortic (valve) insufficiency: Secondary | ICD-10-CM | POA: Insufficient documentation

## 2020-03-12 DIAGNOSIS — R600 Localized edema: Secondary | ICD-10-CM | POA: Insufficient documentation

## 2020-03-12 DIAGNOSIS — Z515 Encounter for palliative care: Secondary | ICD-10-CM | POA: Diagnosis not present

## 2020-03-12 DIAGNOSIS — I4819 Other persistent atrial fibrillation: Secondary | ICD-10-CM | POA: Diagnosis not present

## 2020-03-12 DIAGNOSIS — Z79899 Other long term (current) drug therapy: Secondary | ICD-10-CM | POA: Insufficient documentation

## 2020-03-12 DIAGNOSIS — Z7901 Long term (current) use of anticoagulants: Secondary | ICD-10-CM | POA: Insufficient documentation

## 2020-03-12 DIAGNOSIS — I4892 Unspecified atrial flutter: Secondary | ICD-10-CM | POA: Diagnosis not present

## 2020-03-12 DIAGNOSIS — D6869 Other thrombophilia: Secondary | ICD-10-CM | POA: Diagnosis not present

## 2020-03-12 DIAGNOSIS — R531 Weakness: Secondary | ICD-10-CM | POA: Diagnosis not present

## 2020-03-12 LAB — BASIC METABOLIC PANEL
Anion gap: 10 (ref 5–15)
BUN: 25 mg/dL — ABNORMAL HIGH (ref 8–23)
CO2: 29 mmol/L (ref 22–32)
Calcium: 8.4 mg/dL — ABNORMAL LOW (ref 8.9–10.3)
Chloride: 104 mmol/L (ref 98–111)
Creatinine, Ser: 0.68 mg/dL (ref 0.44–1.00)
GFR, Estimated: >60 mL/min (ref >60)
Glucose, Bld: 138 mg/dL — ABNORMAL HIGH (ref 70–99)
Potassium: 4 mmol/L (ref 3.5–5.1)
Sodium: 143 mmol/L (ref 135–145)

## 2020-03-12 MED ORDER — FUROSEMIDE 20 MG PO TABS: 20.0000 mg | ORAL_TABLET | Freq: Every day | ORAL | 2 refills | Status: DC

## 2020-03-12 NOTE — Progress Notes (Signed)
[facility] Consult Note Telephone: [phone]  Fax: [phone]  PATIENT NAME: [name] [address] [place] [address] [phone] (home)  DOB: 1933/07/10 MRN: [id number]  PRIMARY CARE PROVIDER:    Sharilyn Sites, MD,  [address] [place] [address] [phone]  REFERRING PROVIDER:   Sharilyn Sites, [place],  [place] [address] [phone]  RESPONSIBLE PARTY:   Extended Emergency Contact Information Primary Emergency Contact: [place] Mobile Phone: [phone] Relation: Son Secondary Emergency Contact: Lawrence,Cathy Address: [address]          [place], [place] [address] [place] Phone: [phone] Mobile Phone: [phone] Relation: Daughter  I met face to face with patient in home.   ASSESSMENT AND RECOMMENDATIONS:   Advance Care Planning: Today's visit consisted of building trust and discussions on Palliative care medicine as a specialized medical care for people living with serious illness, aimed at facilitating improved quality of life through symptoms relief, assisting with advance care planning and establishing goals of care. Patient expressed appreciation for education provided on Palliative care and how it differs from Hospice service. Palliative care will continue to provide support to patient, family and the medical team. Goal of care: Patient goal is for comfort. Patient verbalized desire to do the best she can to stay alive and comfortable. Directives: Patient's code status is DNR, she reiterated desire to not be resuscitated in the event of cardiac or respiratory arrest, copy of signed DNR form on Epic EMR, none in home. DNR form was signed and given to patient to keep in a readily assessible area in her home. The need to complete a MOST form was discussed, patient expressed interest in completing a MOST form today. Discussed and  reviewed sections of the form in detail, opportunity for questions given, all questions answered. Form completed and signed with patient, signed form given to patient to keep, copy of MOST form uploaded to [facility] EMR. Details of MOST form include limited additional intervention, antibiotics if indicated, IV fluid for a defined trial period, no feeding tube.  Symptom Management:  Irregular heart rate: Patient with history of uncontrolled Afib, heart rate today is 141 and regularly irregular. Patient report weakness and fatigue. She report that she is so tired that she does not have appetite to eat. She has a history of cardioversions that was successful but patient went out of rhythm couple of days afterwards, last cardioversion was 02/15/2020. She is followed by Afib clinic at [facility], was last seen in the office 03/05/2020, Cardizem 120mg  was added to her regimen at the visit, her regimen includes Toprol-XL 100mg  daily and Eliquis 2.5mg  twice a day. Patient report no improvement in symptoms since the addition of Cardizem, report instead her heart rate is more elevated in the 160s. Patient report dyspnea with activity, report mostly lays on her couch. She is on continuous oxygen with saturation of 96% today. Denied acute cough, denied fever or chills, denied acute bleed. Patient expressed concerns about the cost of Eliquis, saying she was getting it for free but was now told she would have to pay for it, report her pharmacist told her she would probably be paying as much as $600 for it. She report she would be unable to afford it as she is on limited income. Recommendation: Patient advised to discuss her options with her cardiologist during visit. Phone call made to Afib clinic, spoke with her Provider Clint Fenton-PA. Patient  asked to come in to the clinic today for evaluation at 2pm. Chronic pain: Pain in bilateral shoulders and lower back controlled on Hydrocodone 7.5-325mg  three times a day as  needed. Patient denied pain during visit. Poor appetite: Patient with poor oral intake related to poor appetite. She was started on Mirtazapine 15mg  daily by her PCP. Patient report not taking med, report she want to check with her Cardiologist to make ensure its safe for her due to her heart problems. Patient made aware that Mirtazapine is safe for her, she however insist on not taking it until she speak with her Cardiologist. Patient taking Ensure nutritional supplement twice a day. Recommendation: Patient to continue taking Ensure nutritional supplement, may increase to 3 cans a day as tolerated.   Follow up Palliative Care Visit: Palliative care will continue to follow for complex decision making and symptom management. Return in about 4 weeks or prn.  Family /Caregiver/Community Supports: Patient lives at home alone. She has 4 children who takes turn to care for her. One of her son's visiting from [place] mid way during visit today. Her son Simona Huh lives closest to her.  Cognitive / Functional decline: Patient awake and coherent, she however closes her eyes intermittently during visit. She is able to complete her ADLs independently, ambulates with a wheelchair, able to wheel self. Family assist with preparing her meals. No report of recent.  I spent 50 minutes providing this consultation, time includes time spent with patient, chart review, provider coordination, and documentation. More than 50% of the time in this consultation was spent counseling and coordinating communication.   CHIEF COMPLAINT: Weakness  History obtained from review of EMR and discussion with patient. Records reviewed and summarized bellow.  Pertinent labs : 02/16/2020 NA 141, K 4.3, Albumin 2.8, GRF >60   HISTORY OF PRESENT ILLNESS:  [name] is a 87 y.o. year old female with multiple medical problems including  Chronic Afib (on Eliquis), cervicalgia,pinched nerve in shoulder, HTN, Rheumatoid arthritis (on  Leflunomide, and Prednisone tapper), Interstitial lung disease on supplemental oxygen, hx of Colon cancer.  Palliative Care was asked to follow this patient by consultation request of Sharilyn Sites, MD to help address advance care planning and goals of care. This is an initial visit.  CODE STATUS: DNR  PPS: 50%  HOSPICE ELIGIBILITY/DIAGNOSIS: TBD  ROS Constitutional: Endorsed fatigue, denied fever or chiilsa EYES: denies acute vision changes ENMT: denies dysphagia Cardiovascular: denies chest pain, endorsed palpitation Pulmonary: denies acute cough, endorsed increased SOB Abdomen: endorses poor appetite, denied constipation, denied incontinence of bowel GU: denies dysuria, denied incontinence of urine MSK:  endorses ROM limitations, no falls reported Skin: denies rashes or wounds Neurological: endorses weakness, denies pain, denies insomnia Psych: Endorses positive mood Heme/lymph/immuno: denies bruises, abnormal bleeding   Physical Exam: Vital signs: BP 122/60, HR 141, RR 18, 96% on supplemental oxygen Current and past weights: 115lbs, Ht 52f 2", 21kg/m2 Constitutional: NAD General: chronically ill and frail appearing, thin EYES: anicteric sclera, lids intact, no discharge  ENMT: intact hearing, oral mucous membranes moist CV:  trace LE edema Pulmonary: no increased work of breathing, no cough, no audible wheezes Abdomen:  no ascites GU: deferred MSK: severe sarcopenia, no contractures of LE, non ambulatory Skin: warm and dry, no rashes or wounds on visible skin Neuro: Generalized weakness, otherwise non focal Psych: non-anxious affect today, A and O x 3 Hem/lymph/immuno: no widespread bruising   PAST MEDICAL HISTORY:  Past Medical History:  Diagnosis Date  A-fib (HCC)    Atrial fibrillation (HCC)    Cervicalgia    Chronic bronchitis with COPD (chronic obstructive pulmonary disease) (HCC)    Colon cancer (HCC)    Degeneration of cervical intervertebral disc     Hypertension    Pinched nerve in shoulder    Rheumatoid arthritis(714.0)    RUQ pain    Vulvar dystrophy    Yeast infection     SOCIAL HX:  Social History   Tobacco Use   Smoking status: Never Smoker   Smokeless tobacco: Never Used  Substance Use Topics   Alcohol use: No   FAMILY HX:  Family History  Problem Relation Age of Onset   Rheum arthritis Mother    Parkinsonism Father    Hiatal hernia Sister    Healthy Brother    Healthy Daughter    Healthy Son    Healthy Son     ALLERGIES:  Allergies  Allergen Reactions   Iodinated Diagnostic Agents Anaphylaxis    08-12-10 Pt had severe reaction with itching/hives/loss of consciousness within 5 minutes of CEPI today.  She received both Kenalog and Iondinated Contrast, so not certain which agent caused reaction.  If patient returns for another CEPI, Dr. Jobe Igo considers using Decadron and a 13-hour prep.  jkl   Nerve Block Tray Anaphylaxis    Patient states that she was sent to have Epidural for her shoulder. After rec'ving she remembers saying that she was itching all over  And that's it. She was transported to hospital  By EMS. Stayed overnight.   Nitrofuran Derivatives Hives and Nausea And Vomiting   Triamcinolone Acetonide Anaphylaxis    08-12-10  Pt had severe reaction with itching/hives/loss of consciousness within five minutes of CEPI today.  She received both Kenalog and Iodinated Contrast, so not certain which agent caused reaction.  If pt returns for another cervical epidural steroid injection, Dr. Jobe Igo suggests using Decadron and a 13-hr prep.  JKL PATIENT USES PROCTOCREAM 2.5% WITHOUT DIFFICULTY FOR VULVAR DYSTROPHY Patient says she can take prednisone without any problem   Vibra-Tab [Doxycycline] Nausea And Vomiting     PERTINENT MEDICATIONS:  Outpatient Encounter Medications as of 03/12/2020  Medication Sig   apixaban (ELIQUIS) 2.5 MG TABS tablet Take 1 tablet (2.5 mg total) by mouth 2 (two)  times daily.   cholecalciferol (VITAMIN D3) 25 MCG (1000 UT) tablet Take 2,000 Units by mouth 2 (two) times daily.    diltiazem (CARDIZEM CD) 120 MG 24 hr capsule Take 1 capsule (120 mg total) by mouth at bedtime.   HYDROcodone-acetaminophen (NORCO) 7.5-325 MG tablet Take 1 tablet by mouth 3 (three) times daily as needed for moderate pain.   leflunomide (ARAVA) 20 MG tablet Take 1 tablet (20 mg total) by mouth daily.   metoprolol succinate (TOPROL-XL) 100 MG 24 hr tablet Take 100 mg by mouth daily.   Omega-3 Fatty Acids (FISH OIL) 1000 MG CAPS Take 1 capsule by mouth daily.   omeprazole (PRILOSEC) 20 MG capsule Take 1 capsule (20 mg total) by mouth in the morning and at bedtime.   Polyethyl Glycol-Propyl Glycol (SYSTANE) 0.4-0.3 % SOLN Apply 2 drops to eye daily as needed (dry eye).   predniSONE (DELTASONE) 10 MG tablet Take 10 mg by mouth See admin instructions. Take 5 tablets by mouth for 3 days, 4 tabs for 3 days, 3 tabs for 3 days, 2 tabs for 3 days and 1 tab for 3 days.   rosuvastatin (CRESTOR) 20 MG tablet Take 1 tablet (  20 mg total) by mouth daily at 6 PM.   No facility-administered encounter medications on file as of 03/12/2020.    Thank you for the opportunity to participate in the care of Ms Samatha Anspach. The palliative care team will continue to follow. Please call our office at [phone] if we can be of additional assistance.  Jari Favre, DNP, AGPCNP-BC

## 2020-03-12 NOTE — Progress Notes (Signed)
Primary Care Physician: Sharilyn Sites, MD Primary Cardiologist: Dr Harl Bowie Primary Electrophysiologist: none Referring Physician: Dr Marlowe Alt is a 87 y.o. female with a history of atrial fibrillation, prior CVA, interstitial lung disease, rheumatoid arthritis who presents for consultation in the [facility]. The patient was initially diagnosed with atrial fibrillation remotely and has been on Eliquis for a CHADS2VASC score of 6. She underwent DCCV on 09/2019 and had recurrent rapid afib on 02/15/20 and underwent DCCV at that time. Unfortunately, she feels she was back out of rhythm 02/18/20. She had been on a prednisone taper for her RA. Patient is s/p Multaq initiation with repeat DCCV on 02/26/20. She noted she was back in afib 4 days later.   On follow up today, patient's palliative care provider called AF clinic this AM regarding her heart rates. Patient reports her rates have been ~150s since her last visit. Her SOB is worse than her baseline and she has had some lower extremity edema. Of note, she has what sounds like an abscess in her left axillary region and is now on antibiotics per her PCP.   Today, she denies symptoms of chest pain, orthopnea, PND, dizziness, presyncope, syncope, snoring, daytime somnolence, bleeding, or neurologic sequela. The patient is tolerating medications without difficulties and is otherwise without complaint today.    Atrial Fibrillation Risk Factors:  she does not have symptoms or diagnosis of sleep apnea. she does not have a history of rheumatic fever. she does not have a history of alcohol use. The patient does not have a history of early familial atrial fibrillation or other arrhythmias.  she has a BMI of Body mass index is 21.36 kg/m.Marland Kitchen Filed Weights   03/12/20 1404  Weight: 53 kg    Family History  Problem Relation Age of Onset  . Rheum arthritis Mother   . Parkinsonism Father   . Hiatal hernia  Sister   . Healthy Brother   . Healthy Daughter   . Healthy Son   . Healthy Son      Atrial Fibrillation Management history:  Previous antiarrhythmic drugs: Multaq Previous cardioversions: 09/2019, 02/16/20, 02/26/20 Previous ablations: none CHADS2VASC score: 6 Anticoagulation history: Eliquis   Past Medical History:  Diagnosis Date  . A-fib ([place])   . Atrial fibrillation ([place])   . Cervicalgia   . Chronic bronchitis with COPD (chronic obstructive pulmonary disease) ([place])   . Colon cancer ([place])   . Degeneration of cervical intervertebral disc   . Hypertension   . Pinched nerve in shoulder   . Rheumatoid arthritis(714.0)   . RUQ pain   . Vulvar dystrophy   . Yeast infection    Past Surgical History:  Procedure Laterality Date  . ABDOMINAL HYSTERECTOMY  1960'S  . APPENDECTOMY  with hysterectomy  . BACK SURGERY  12/08/10   plate/screws C3 C4  . CARDIOVERSION N/A 09/29/2019   Procedure: CARDIOVERSION;  Surgeon: Arnoldo Lenis, MD;  [facility];  Service: Endoscopy;  Laterality: N/A;  pt ate early this AM per Dr. Harl Bowie  . CARDIOVERSION N/A 02/26/2020   Procedure: CARDIOVERSION;  Surgeon: Sueanne Margarita, MD;  Location: [facility] ENDOSCOPY;  Service: Cardiovascular;  Laterality: N/A;  . CHOLECYSTECTOMY  2002  . COLONOSCOPY  04/02/2009  . COLONOSCOPY  06/04/05  . COLONOSCOPY N/A 07/26/2015   Procedure: COLONOSCOPY;  Surgeon: Rogene Houston, MD;  Location: [facility] ENDO SU[facility];  Service: Endoscopy;  Laterality: N/A;  1240  . COLONOSCOPY WITH  ESOPHAGOGASTRODUODENOSCOPY (EGD) N/A 04/08/2012   Procedure: COLONOSCOPY WITH ESOPHAGOGASTRODUODENOSCOPY (EGD);  Surgeon: Rogene Houston, MD;  Location: [facility];  Service: Endoscopy;  Laterality: N/A;  130-moved to 245 Ann notified pt  . ESOPHAGOGASTRODUODENOSCOPY N/A 07/26/2015   Procedure: ESOPHAGOGASTRODUODENOSCOPY (EGD);  Surgeon: Rogene Houston, MD;  Location: [facility];  Service: Endoscopy;  Laterality: N/A;  . HEMICOLECTOMY   10/05    Current Outpatient Medications  Medication Sig Dispense Refill  . apixaban (ELIQUIS) 2.5 MG TABS tablet Take 1 tablet (2.5 mg total) by mouth 2 (two) times daily. 60 tablet 0  . cholecalciferol (VITAMIN D3) 25 MCG (1000 UT) tablet Take 2,000 Units by mouth 2 (two) times daily.     Marland Kitchen diltiazem (CARDIZEM CD) 120 MG 24 hr capsule Take 1 capsule (120 mg total) by mouth at bedtime. 30 capsule 3  . doxycycline (ADOXA) 100 MG tablet Take 100 mg by mouth 2 (two) times daily.    Marland Kitchen HYDROcodone-acetaminophen (NORCO) 7.5-325 MG tablet Take 1 tablet by mouth 3 (three) times daily as needed for moderate pain. 15 tablet 0  . leflunomide (ARAVA) 20 MG tablet Take 1 tablet (20 mg total) by mouth daily. 30 tablet 0  . metoprolol succinate (TOPROL-XL) 100 MG 24 hr tablet Take 100 mg by mouth daily.    . mirtazapine (REMERON) 15 MG tablet Take 15 mg by mouth at bedtime.    . Omega-3 Fatty Acids (FISH OIL) 1000 MG CAPS Take 1 capsule by mouth daily.    Marland Kitchen omeprazole (PRILOSEC) 20 MG capsule Take 1 capsule (20 mg total) by mouth in the morning and at bedtime.    Vladimir Faster Glycol-Propyl Glycol (SYSTANE) 0.4-0.3 % SOLN Apply 2 drops to eye daily as needed (dry eye).    . predniSONE (DELTASONE) 10 MG tablet Take 10 mg by mouth See admin instructions. Take 5 tablets by mouth for 3 days, 4 tabs for 3 days, 3 tabs for 3 days, 2 tabs for 3 days and 1 tab for 3 days.    . rosuvastatin (CRESTOR) 20 MG tablet Take 1 tablet (20 mg total) by mouth daily at 6 PM. 30 tablet 0   No current facility-administered medications for this encounter.    Allergies  Allergen Reactions  . Iodinated Diagnostic Agents Anaphylaxis    08-12-10 Pt had severe reaction with itching/hives/loss of consciousness within 5 minutes of CEPI today.  She received both Kenalog and Iondinated Contrast, so not certain which agent caused reaction.  If patient returns for another CEPI, Dr. Jobe Igo considers using Decadron and a 13-hour prep.  jkl  .  Nerve Block Tray Anaphylaxis    Patient states that she was sent to have Epidural for her shoulder. After rec'ving she remembers saying that she was itching all over  And that's it. She was transported to hospital  By EMS. Stayed overnight.  [place] Spikes Derivatives Hives and Nausea And Vomiting  . Triamcinolone Acetonide Anaphylaxis    08-12-10  Pt had severe reaction with itching/hives/loss of consciousness within five minutes of CEPI today.  She received both Kenalog and Iodinated Contrast, so not certain which agent caused reaction.  If pt returns for another cervical epidural steroid injection, Dr. Jobe Igo suggests using Decadron and a 13-hr prep.  JKL PATIENT USES PROCTOCREAM 2.5% WITHOUT DIFFICULTY FOR VULVAR DYSTROPHY Patient says she can take prednisone without any problem  . Vibra-Tab [Doxycycline] Nausea And Vomiting    Social History   Socioeconomic History  . Marital status: Married  Spouse name: Not on file  . Number of children: Not on file  . Years of education: Not on file  . Highest education level: Not on file  Occupational History  . Not on file  Tobacco Use  . Smoking status: Never Smoker  . Smokeless tobacco: Never Used  Vaping Use  . Vaping Use: Never used  Substance and Sexual Activity  . Alcohol use: No  . Drug use: No  . Sexual activity: Never  Other Topics Concern  . Not on file  Social History Narrative   Nonsmoker.  She lives alone.  Her son calls her every day to check on her.  Has a daughter involved in care as well.     Social Determinants of Health   Financial Resource Strain: Not on file  Food Insecurity: Not on file  Transportation Needs: Not on file  Physical Activity: Not on file  Stress: Not on file  Social Connections: Not on file  Intimate Partner Violence: Not on file     ROS- All systems are reviewed and negative except as per the HPI above.  Physical Exam: Vitals:   03/12/20 1404  BP: (!) 98/52  Pulse: (!) 141  Weight:  53 kg  Height: 5\' 2"  (1.575 m)    GEN- The patient is a frail appearing elderly female, alert and oriented x 3 today.   HEENT-head normocephalic, atraumatic, sclera clear, conjunctiva pink, hearing intact, trachea midline. Lungs- fine crackles, mildly labored breathing.  Heart- irregular rate and rhythm, tachycardia, no murmurs, rubs or gallops  GI- soft, NT, ND, + BS Extremities- no clubbing, cyanosis. edema MS- no significant deformity or atrophy Skin- no rash or lesion Psych- euthymic mood, full affect Neuro- strength and sensation are intact   Wt Readings from Last 3 Encounters:  03/12/20 53 kg  03/05/20 52.2 kg  02/26/20 51.3 kg    EKG today demonstrates  Coarse Afib RVR vs atrial flutter with variable block Vent. rate 141 BPM QRS duration 72 ms QT/QTc 284/434 ms  Echo 09/29/19 demonstrated  1. Left ventricular ejection fraction, by estimation, is 70 to 75%. The  left ventricle has normal function. The left ventricle has no regional  wall motion abnormalities. Left ventricular diastolic parameters are  indeterminate.  2. Right ventricular systolic function is normal. The right ventricular  size is normal.  3. Left atrial size was mildly dilated.  4. The mitral valve is normal in structure. Trivial mitral valve  regurgitation. No evidence of mitral stenosis.  5. The aortic valve is tricuspid. Aortic valve regurgitation is mild. No  aortic stenosis is present.  6. The inferior vena cava is normal in size with greater than 50%  respiratory variability, suggesting right atrial pressure of 3 mmHg.   Epic records are reviewed at length today  CHA2DS2-VASc Score = 6  The patient's score is based upon: CHF History: No HTN History: Yes Diabetes History: No Stroke History: Yes Vascular Disease History: No Age Score: 2 Gender Score: 1      ASSESSMENT AND PLAN: 1. Persistent Atrial Fibrillation/atrial flutter The patient's CHA2DS2-VASc score is 6, indicating a  9.7% annual risk of stroke.   S/p DCCV on 02/26/20. Failed Multaq. She remains in atrial flutter with rapid rates. Paradoxically, her heart rate has increased after starting diltiazem. Her BP is low today. We discussed therapeutic options today. Would avoid amiodarone given her ILD. Will stop diltiazem. Will refer her to EP for assistance with medical management, could consider dofetilide. ? If  she would be a candidate for PPM.  Continue Toprol 100 mg daily Continue Eliquis 2.5 mg BID (age, weight)  2. Secondary Hypercoagulable State (ICD10:  D68.69) The patient is at significant risk for stroke/thromboembolism based upon her CHA2DS2-VASc Score of 6.  Continue Apixaban (Eliquis).   3. Lower extremity edema Her weight continues to trend upwards. Suspect related to #1 and CCB. Start Lasix 20 meq daily Check bmet today.   Follow up with EP at next available appt.   [facility] [address] [place], [place] [address] [phone] 03/12/2020 2:13 PM

## 2020-03-12 NOTE — Patient Instructions (Signed)
Stop cardizem (diltiazem)  Start lasix 20mg  once a day   Referral to Dr. Lovena Le in [place] placed. Their office will call for appointment.

## 2020-03-13 ENCOUNTER — Other Ambulatory Visit: Payer: Self-pay

## 2020-03-13 ENCOUNTER — Other Ambulatory Visit: Payer: Self-pay | Admitting: Nurse Practitioner

## 2020-03-13 ENCOUNTER — Telehealth: Payer: Self-pay | Admitting: *Deleted

## 2020-03-13 NOTE — Telephone Encounter (Signed)
Called to notify pt that she has denied [employer]MS patient assistance until she has meet her OOP cost of $248.72 in Rx.

## 2020-03-14 ENCOUNTER — Ambulatory Visit: Payer: Medicare Other | Admitting: Internal Medicine

## 2020-03-14 ENCOUNTER — Other Ambulatory Visit: Payer: Self-pay

## 2020-03-14 ENCOUNTER — Encounter: Payer: Self-pay | Admitting: Internal Medicine

## 2020-03-14 VITALS — BP 132/64 | HR 108 | Ht 63.0 in | Wt 114.8 lb

## 2020-03-14 DIAGNOSIS — I4819 Other persistent atrial fibrillation: Secondary | ICD-10-CM

## 2020-03-14 LAB — CBC WITH DIFFERENTIAL/PLATELET
Basophils Absolute: 0.1 10*3/uL (ref 0.0–0.2)
Basos: 1 %
EOS (ABSOLUTE): 0.2 10*3/uL (ref 0.0–0.4)
Eos: 3 %
Hematocrit: 31.6 % — ABNORMAL LOW (ref 34.0–46.6)
Hemoglobin: 10.6 g/dL — ABNORMAL LOW (ref 11.1–15.9)
Lymphocytes Absolute: 0.8 10*3/uL (ref 0.7–3.1)
Lymphs: 14 %
MCH: 31.3 pg (ref 26.6–33.0)
MCHC: 33.5 g/dL (ref 31.5–35.7)
MCV: 93 fL (ref 79–97)
Monocytes Absolute: 0.7 10*3/uL (ref 0.1–0.9)
Monocytes: 11 %
Neutrophils Absolute: 4.3 10*3/uL (ref 1.4–7.0)
Neutrophils: 71 %
Platelets: 226 10*3/uL (ref 150–450)
RBC: 3.39 x10E6/uL — ABNORMAL LOW (ref 3.77–5.28)
RDW: 18 % — ABNORMAL HIGH (ref 11.7–15.4)
WBC: 6 10*3/uL (ref 3.4–10.8)

## 2020-03-14 NOTE — Progress Notes (Signed)
HPI [name] is referred today for consideration of AV node ablation and PPM. She has tried multiple medications in the past. She has a h/o RA with lung involvement. She has not had syncope. She has very rapid atrial fib and has failed multiple AA drugs. Despite high dose toprol, she has had a RVR in the 140's.  Allergies  Allergen Reactions  . Iodinated Diagnostic Agents Anaphylaxis    08-12-10 Pt had severe reaction with itching/hives/loss of consciousness within 5 minutes of CEPI today.  She received both Kenalog and Iondinated Contrast, so not certain which agent caused reaction.  If patient returns for another CEPI, Dr. Jobe Igo considers using Decadron and a 13-hour prep.  jkl  . Nerve Block Tray Anaphylaxis    Patient states that she was sent to have Epidural for her shoulder. After rec'ving she remembers saying that she was itching all over  And that's it. She was transported to hospital  By EMS. Stayed overnight.  [place]ord Spikes Derivatives Hives and Nausea And Vomiting  . Triamcinolone Acetonide Anaphylaxis    08-12-10  Pt had severe reaction with itching/hives/loss of consciousness within five minutes of CEPI today.  She received both Kenalog and Iodinated Contrast, so not certain which agent caused reaction.  If pt returns for another cervical epidural steroid injection, Dr. Jobe Igo suggests using Decadron and a 13-hr prep.  JKL PATIENT USES PROCTOCREAM 2.5% WITHOUT DIFFICULTY FOR VULVAR DYSTROPHY Patient says she can take prednisone without any problem  . Vibra-Tab [Doxycycline] Nausea And Vomiting     Current Outpatient Medications  Medication Sig Dispense Refill  . apixaban (ELIQUIS) 2.5 MG TABS tablet Take 1 tablet (2.5 mg total) by mouth 2 (two) times daily. 60 tablet 0  . cholecalciferol (VITAMIN D3) 25 MCG (1000 UT) tablet Take 2,000 Units by mouth 2 (two) times daily.     Marland Kitchen doxycycline (ADOXA) 100 MG tablet Take 100 mg by mouth 2 (two) times daily.    Marland Kitchen dronabinol  (MARINOL) 5 MG capsule Take 5 mg by mouth daily.    . furosemide (LASIX) 20 MG tablet Take 1 tablet (20 mg total) by mouth daily. 30 tablet 2  . HYDROcodone-acetaminophen (NORCO) 7.5-325 MG tablet Take 1 tablet by mouth 3 (three) times daily as needed for moderate pain. 15 tablet 0  . leflunomide (ARAVA) 20 MG tablet Take 1 tablet (20 mg total) by mouth daily. 30 tablet 0  . metoprolol succinate (TOPROL-XL) 100 MG 24 hr tablet Take 100 mg by mouth daily.    . mirtazapine (REMERON) 15 MG tablet Take 15 mg by mouth at bedtime.    . Omega-3 Fatty Acids (FISH OIL) 1000 MG CAPS Take 1 capsule by mouth daily.    Marland Kitchen omeprazole (PRILOSEC) 20 MG capsule Take 1 capsule (20 mg total) by mouth in the morning and at bedtime.    Vladimir Faster Glycol-Propyl Glycol (SYSTANE) 0.4-0.3 % SOLN Apply 2 drops to eye daily as needed (dry eye).    . rosuvastatin (CRESTOR) 20 MG tablet Take 1 tablet (20 mg total) by mouth daily at 6 PM. 30 tablet 0   No current facility-administered medications for this visit.     Past Medical History:  Diagnosis Date  . A-fib ([place])   . Atrial fibrillation ([place])   . Cervicalgia   . Chronic bronchitis with COPD (chronic obstructive pulmonary disease) ([place])   . Colon cancer ([place])   . Degeneration of cervical intervertebral disc   . Hypertension   .  Pinched nerve in shoulder   . Rheumatoid arthritis(714.0)   . RUQ pain   . Vulvar dystrophy   . Yeast infection     ROS:   All systems reviewed and negative except as noted in the HPI.   Past Surgical History:  Procedure Laterality Date  . ABDOMINAL HYSTERECTOMY  1960'S  . APPENDECTOMY  with hysterectomy  . BACK SURGERY  12/08/10   plate/screws C3 C4  . CARDIOVERSION N/A 09/29/2019   Procedure: CARDIOVERSION;  Surgeon: Arnoldo Lenis, MD;  [facility];  Service: Endoscopy;  Laterality: N/A;  pt ate early this AM per Dr. Harl Bowie  . CARDIOVERSION N/A 02/26/2020   Procedure: CARDIOVERSION;  Surgeon: Sueanne Margarita, MD;  Location: [facility] ENDOSCOPY;  Service: Cardiovascular;  Laterality: N/A;  . CHOLECYSTECTOMY  2002  . COLONOSCOPY  04/02/2009  . COLONOSCOPY  06/04/05  . COLONOSCOPY N/A 07/26/2015   Procedure: COLONOSCOPY;  Surgeon: Rogene Houston, MD;  [facility]: [facility];  Service: Endoscopy;  Laterality: N/A;  1240  . COLONOSCOPY WITH ESOPHAGOGASTRODUODENOSCOPY (EGD) N/A 04/08/2012   Procedure: COLONOSCOPY WITH ESOPHAGOGASTRODUODENOSCOPY (EGD);  Surgeon: Rogene Houston, MD;  [facility];  Service: Endoscopy;  Laterality: N/A;  130-moved to 245 Ann notified pt  . ESOPHAGOGASTRODUODENOSCOPY N/A 07/26/2015   Procedure: ESOPHAGOGASTRODUODENOSCOPY (EGD);  Surgeon: Rogene Houston, MD;  [facility]: [facility];  Service: Endoscopy;  Laterality: N/A;  . HEMICOLECTOMY  10/05     Family History  Problem Relation Age of Onset  . Rheum arthritis Mother   . Parkinsonism Father   . Hiatal hernia Sister   . Healthy Brother   . Healthy Daughter   . Healthy Son   . Healthy Son      Social History   Socioeconomic History  . Marital status: Married    Spouse name: Not on file  . Number of children: Not on file  . Years of education: Not on file  . Highest education level: Not on file  Occupational History  . Not on file  Tobacco Use  . Smoking status: Never Smoker  . Smokeless tobacco: Never Used  Vaping Use  . Vaping Use: Never used  Substance and Sexual Activity  . Alcohol use: No  . Drug use: No  . Sexual activity: Never  Other Topics Concern  . Not on file  Social History Narrative   Nonsmoker.  She lives alone.  Her son calls her every day to check on her.  Has a daughter involved in care as well.     Social Determinants of Health   Financial Resource Strain: Not on file  Food Insecurity: Not on file  Transportation Needs: Not on file  Physical Activity: Not on file  Stress: Not on file  Social Connections: Not on file  Intimate Partner Violence: Not on file      BP 132/64   Pulse (!) 108   Ht 5\' 3"  (1.6 m)   Wt 114 lb 12.8 oz (52.1 kg)   SpO2 (!) 88%   BMI 20.34 kg/m   Physical Exam:  Well appearing NAD HEENT: Unremarkable Neck:  No JVD, no thyromegally Lymphatics:  No adenopathy Back:  No CVA tenderness Lungs:  Scattered rales.  HEART:  IRegular tachy rate and rhythm, no murmurs, no rubs, no clicks Abd:  soft, positive bowel sounds, no organomegally, no rebound, no guarding Ext:  2 plus pulses, no edema, no cyanosis, no clubbing Skin:  No rashes no nodules Neuro:  CN II through XII intact, motor grossly intact  EKG - reviewed - atrial fib with a RVR   Assess/Plan: 1. Uncontrolled atrial fib - I have discussed the indications /risks/benefits/goals/expectations of AV node ablation and PPM insertion with the patient and her son and she wishes to proceed. 2. coags - she will hold her eliquis 2 days before the procedure. 3. Diastolic heart failure - I suspect her symptoms will improved once her rate is under better control.  Carleene Overlie Ermalee Mealy,MD

## 2020-03-14 NOTE — H&P (View-Only) (Signed)
HPI [name] is referred today for consideration of AV node ablation and PPM. She has tried multiple medications in the past. She has a h/o RA with lung involvement. She has not had syncope. She has very rapid atrial fib and has failed multiple AA drugs. Despite high dose toprol, she has had a RVR in the 140's.  Allergies  Allergen Reactions  . Iodinated Diagnostic Agents Anaphylaxis    08-12-10 Pt had severe reaction with itching/hives/loss of consciousness within 5 minutes of CEPI today.  She received both Kenalog and Iondinated Contrast, so not certain which agent caused reaction.  If patient returns for another CEPI, Dr. Jobe Igo considers using Decadron and a 13-hour prep.  jkl  . Nerve Block Tray Anaphylaxis    Patient states that she was sent to have Epidural for her shoulder. After rec'ving she remembers saying that she was itching all over  And that's it. She was transported to hospital  By EMS. Stayed overnight.  [place] Spikes Derivatives Hives and Nausea And Vomiting  . Triamcinolone Acetonide Anaphylaxis    08-12-10  Pt had severe reaction with itching/hives/loss of consciousness within five minutes of CEPI today.  She received both Kenalog and Iodinated Contrast, so not certain which agent caused reaction.  If pt returns for another cervical epidural steroid injection, Dr. Jobe Igo suggests using Decadron and a 13-hr prep.  JKL PATIENT USES PROCTOCREAM 2.5% WITHOUT DIFFICULTY FOR VULVAR DYSTROPHY Patient says she can take prednisone without any problem  . Vibra-Tab [Doxycycline] Nausea And Vomiting     Current Outpatient Medications  Medication Sig Dispense Refill  . apixaban (ELIQUIS) 2.5 MG TABS tablet Take 1 tablet (2.5 mg total) by mouth 2 (two) times daily. 60 tablet 0  . cholecalciferol (VITAMIN D3) 25 MCG (1000 UT) tablet Take 2,000 Units by mouth 2 (two) times daily.     Marland Kitchen doxycycline (ADOXA) 100 MG tablet Take 100 mg by mouth 2 (two) times daily.    Marland Kitchen dronabinol  (MARINOL) 5 MG capsule Take 5 mg by mouth daily.    . furosemide (LASIX) 20 MG tablet Take 1 tablet (20 mg total) by mouth daily. 30 tablet 2  . HYDROcodone-acetaminophen (NORCO) 7.5-325 MG tablet Take 1 tablet by mouth 3 (three) times daily as needed for moderate pain. 15 tablet 0  . leflunomide (ARAVA) 20 MG tablet Take 1 tablet (20 mg total) by mouth daily. 30 tablet 0  . metoprolol succinate (TOPROL-XL) 100 MG 24 hr tablet Take 100 mg by mouth daily.    . mirtazapine (REMERON) 15 MG tablet Take 15 mg by mouth at bedtime.    . Omega-3 Fatty Acids (FISH OIL) 1000 MG CAPS Take 1 capsule by mouth daily.    Marland Kitchen omeprazole (PRILOSEC) 20 MG capsule Take 1 capsule (20 mg total) by mouth in the morning and at bedtime.    [name] Glycol-Propyl Glycol (SYSTANE) 0.4-0.3 % SOLN Apply 2 drops to eye daily as needed (dry eye).    . rosuvastatin (CRESTOR) 20 MG tablet Take 1 tablet (20 mg total) by mouth daily at 6 PM. 30 tablet 0   No current facility-administered medications for this visit.     Past Medical History:  Diagnosis Date  . A-fib ([place])   . Atrial fibrillation ([place])   . Cervicalgia   . Chronic bronchitis with COPD (chronic obstructive pulmonary disease) ([place])   . Colon cancer ([place])   . Degeneration of cervical intervertebral disc   . Hypertension   .  Pinched nerve in shoulder   . Rheumatoid arthritis(714.0)   . RUQ pain   . Vulvar dystrophy   . Yeast infection     ROS:   All systems reviewed and negative except as noted in the HPI.   Past Surgical History:  Procedure Laterality Date  . ABDOMINAL HYSTERECTOMY  1960'S  . APPENDECTOMY  with hysterectomy  . BACK SURGERY  12/08/10   plate/screws C3 C4  . CARDIOVERSION N/A 09/29/2019   Procedure: CARDIOVERSION;  Surgeon: Arnoldo Lenis, MD;  [facility];  Service: Endoscopy;  Laterality: N/A;  pt ate early this AM per Dr. Harl Bowie  . CARDIOVERSION N/A 02/26/2020   Procedure: CARDIOVERSION;  Surgeon: Sueanne Margarita, MD;  Location: [facility] ENDOSCOPY;  Service: Cardiovascular;  Laterality: N/A;  . CHOLECYSTECTOMY  2002  . COLONOSCOPY  04/02/2009  . COLONOSCOPY  06/04/05  . COLONOSCOPY N/A 07/26/2015   Procedure: COLONOSCOPY;  Surgeon: Rogene Houston, MD;  [facility];  Service: Endoscopy;  Laterality: N/A;  1240  . COLONOSCOPY WITH ESOPHAGOGASTRODUODENOSCOPY (EGD) N/A 04/08/2012   Procedure: COLONOSCOPY WITH ESOPHAGOGASTRODUODENOSCOPY (EGD);  Surgeon: Rogene Houston, MD;  [facility];  Service: Endoscopy;  Laterality: N/A;  130-moved to 245 Ann notified pt  . ESOPHAGOGASTRODUODENOSCOPY N/A 07/26/2015   Procedure: ESOPHAGOGASTRODUODENOSCOPY (EGD);  Surgeon: Rogene Houston, MD;  [facility];  Service: Endoscopy;  Laterality: N/A;  . HEMICOLECTOMY  10/05     Family History  Problem Relation Age of Onset  . Rheum arthritis Mother   . Parkinsonism Father   . Hiatal hernia Sister   . Healthy Brother   . Healthy Daughter   . Healthy Son   . Healthy Son      Social History   Socioeconomic History  . Marital status: Married    Spouse name: Not on file  . Number of children: Not on file  . Years of education: Not on file  . Highest education level: Not on file  Occupational History  . Not on file  Tobacco Use  . Smoking status: Never Smoker  . Smokeless tobacco: Never Used  Vaping Use  . Vaping Use: Never used  Substance and Sexual Activity  . Alcohol use: No  . Drug use: No  . Sexual activity: Never  Other Topics Concern  . Not on file  Social History Narrative   Nonsmoker.  She lives alone.  Her son calls her every day to check on her.  Has a daughter involved in care as well.     Social Determinants of Health   Financial Resource Strain: Not on file  Food Insecurity: Not on file  Transportation Needs: Not on file  Physical Activity: Not on file  Stress: Not on file  Social Connections: Not on file  Intimate Partner Violence: Not on file      BP 132/64   Pulse (!) 108   Ht 5\' 3"  (1.6 m)   Wt 114 lb 12.8 oz (52.1 kg)   SpO2 (!) 88%   BMI 20.34 kg/m   Physical Exam:  Well appearing NAD HEENT: Unremarkable Neck:  No JVD, no thyromegally Lymphatics:  No adenopathy Back:  No CVA tenderness Lungs:  Scattered rales.  HEART:  IRegular tachy rate and rhythm, no murmurs, no rubs, no clicks Abd:  soft, positive bowel sounds, no organomegally, no rebound, no guarding Ext:  2 plus pulses, no edema, no cyanosis, no clubbing Skin:  No rashes no nodules Neuro:  CN II through XII intact, motor grossly intact  EKG - reviewed - atrial fib with a RVR   Assess/Plan: 1. Uncontrolled atrial fib - I have discussed the indications /risks/benefits/goals/expectations of AV node ablation and PPM insertion with the patient and her son and she wishes to proceed. 2. coags - she will hold her eliquis 2 days before the procedure. 3. Diastolic heart failure - I suspect her symptoms will improved once her rate is under better control.  Carleene Overlie Jamari Moten,MD

## 2020-03-14 NOTE — Patient Instructions (Addendum)
Medication Instructions:  Your physician recommends that you continue on your current medications as directed. Please refer to the Current Medication list given to you today.  Labwork: You will get lab work today:  CBC  Testing/Procedures: None ordered.  Follow-Up:  SEE INSTRUCTION LETTER  Any Other Special Instructions Will Be Listed Below (If Applicable).  If you need a refill on your cardiac medications before your next appointment, please call your pharmacy.    Cardiac electrophysiology: From cell to bedside (7th ed., pp. 2952-8413). [place], [place]: [employer].">  Cardiac Ablation Cardiac ablation is a procedure to destroy, or ablate, a small amount of heart tissue in very specific places. The heart has many electrical connections. Sometimes these connections are abnormal and can cause the heart to beat very fast or irregularly. Ablating some of the areas that cause problems can improve the heart's rhythm or return it to normal. Ablation may be done for people who:  Have Wolff-Parkinson-White syndrome.  Have fast heart rhythms (tachycardia).  Have taken medicines for an abnormal heart rhythm (arrhythmia) that were not effective or caused side effects.  Have a high-risk heartbeat that may be life-threatening. During the procedure, a small incision is made in the neck or the groin, and a long, thin tube (catheter) is inserted into the incision and moved to the heart. Small devices (electrodes) on the tip of the catheter will send out electrical currents. A type of X-ray (fluoroscopy) will be used to help guide the catheter and to provide images of the heart. Tell a health care provider about:  Any allergies you have.  All medicines you are taking, including vitamins, herbs, eye drops, creams, and over-the-counter medicines.  Any problems you or family members have had with anesthetic medicines.  Any blood disorders you have.  Any surgeries you have had.  Any medical  conditions you have, such as kidney failure.  Whether you are pregnant or may be pregnant. What are the risks? Generally, this is a safe procedure. However, problems may occur, including:  Infection.  Bruising and bleeding at the catheter insertion site.  Bleeding into the chest, especially into the sac that surrounds the heart. This is a serious complication.  Stroke or blood clots.  Damage to nearby structures or organs.  Allergic reaction to medicines or dyes.  Need for a permanent pacemaker if the normal electrical system is damaged. A pacemaker is a small computer that sends electrical signals to the heart and helps your heart beat normally.  The procedure not being fully effective. This may not be recognized until months later. Repeat ablation procedures are sometimes done. What happens before the procedure? Medicines Ask your health care provider about:  Changing or stopping your regular medicines. This is especially important if you are taking diabetes medicines or blood thinners.  Taking medicines such as aspirin and ibuprofen. These medicines can thin your blood. Do not take these medicines unless your health care provider tells you to take them.  Taking over-the-counter medicines, vitamins, herbs, and supplements. General instructions  Follow instructions from your health care provider about eating or drinking restrictions.  Plan to have someone take you home from the hospital or clinic.  If you will be going home right after the procedure, plan to have someone with you for 24 hours.  Ask your health care provider what steps will be taken to prevent infection. What happens during the procedure?  An IV will be inserted into one of your veins.  You will be given a  medicine to help you relax (sedative).  The skin on your neck or groin will be numbed.  An incision will be made in your neck or your groin.  A needle will be inserted through the incision and into  a large vein in your neck or groin.  A catheter will be inserted into the needle and moved to your heart.  Dye may be injected through the catheter to help your surgeon see the area of the heart that needs treatment.  Electrical currents will be sent from the catheter to ablate heart tissue in desired areas. There are three types of energy that may be used to do this: ? Heat (radiofrequency energy). ? Laser energy. ? Extreme cold (cryoablation).  When the tissue has been ablated, the catheter will be removed.  Pressure will be held on the insertion area to prevent a lot of bleeding.  A bandage (dressing) will be placed over the insertion area. The exact procedure may vary among health care providers and hospitals.   What happens after the procedure?  Your blood pressure, heart rate, breathing rate, and blood oxygen level will be monitored until you leave the hospital or clinic.  Your insertion area will be monitored for bleeding. You will need to lie still for a few hours to ensure that you do not bleed from the insertion area.  Do not drive for 24 hours or as long as told by your health care provider. Summary  Cardiac ablation is a procedure to destroy, or ablate, a small amount of heart tissue using an electrical current. This procedure can improve the heart rhythm or return it to normal.  Tell your health care provider about any medical conditions you may have and all medicines you are taking to treat them.  This is a safe procedure, but problems may occur. Problems may include infection, bruising, damage to nearby organs or structures, or allergic reactions to medicines.  Follow your health care provider's instructions about eating and drinking before the procedure. You may also be told to change or stop some of your medicines.  After the procedure, do not drive for 24 hours or as long as told by your health care provider. This information is not intended to replace advice  given to you by your health care provider. Make sure you discuss any questions you have with your health care provider. Document Revised: 11/07/2018 Document Reviewed: 11/07/2018 Elsevier Patient Education  2021 [place].    Pacemaker Implantation, Adult Pacemaker implantation is a procedure to place a pacemaker inside the chest. A pacemaker is a small computer that sends electrical signals to the heart and helps the heart beat normally. A pacemaker also stores information about heart rhythms. You may need pacemaker implantation if you have:  A slow heartbeat (bradycardia).  Loss of consciousness that happens repeatedly (syncope) or repeated episodes of dizziness or light-headedness because of an irregular heart rate.  Shortness of breath (dyspnea) due to heart problems. The pacemaker usually attaches to your heart through a wire called a lead. One or two leads may be needed. There are different types of pacemakers:  Transvenous pacemaker. This type is placed under the skin or muscle of your upper chest area. The lead goes through a vein in the chest area to reach the inside of the heart.  Epicardial pacemaker. This type is placed under the skin or muscle of your chest or abdomen. The lead goes through your chest to the outside of the heart. Tell a health  care provider about:  Any allergies you have.  All medicines you are taking, including vitamins, herbs, eye drops, creams, and over-the-counter medicines.  Any problems you or family members have had with anesthetic medicines.  Any blood or bone disorders you have.  Any surgeries you have had.  Any medical conditions you have.  Whether you are pregnant or may be pregnant. What are the risks? Generally, this is a safe procedure. However, problems may occur, including:  Infection.  Bleeding.  Failure of the pacemaker or the lead.  Collapse of a lung or bleeding into a lung.  Blood clot inside a blood vessel with a  lead.  Damage to the heart.  Infection inside the heart (endocarditis).  Allergic reactions to medicines. What happens before the procedure? Staying hydrated Follow instructions from your health care provider about hydration, which may include:  Up to 2 hours before the procedure - you may continue to drink clear liquids, such as water, clear fruit juice, black coffee, and plain tea.   Eating and drinking restrictions Follow instructions from your health care provider about eating and drinking, which may include:  8 hours before the procedure - stop eating heavy meals or foods, such as meat, fried foods, or fatty foods.  6 hours before the procedure - stop eating light meals or foods, such as toast or cereal.  6 hours before the procedure - stop drinking milk or drinks that contain milk.  2 hours before the procedure - stop drinking clear liquids. Medicines Ask your health care provider about:  Changing or stopping your regular medicines. This is especially important if you are taking diabetes medicines or blood thinners.  Taking medicines such as aspirin and ibuprofen. These medicines can thin your blood. Do not take these medicines unless your health care provider tells you to take them.  Taking over-the-counter medicines, vitamins, herbs, and supplements. Tests You may have:  A heart evaluation. This may include: ? An electrocardiogram (ECG). This involves placing patches on your skin to check your heart rhythm. ? A chest X-ray. ? An echocardiogram. This is a test that uses sound waves (ultrasound) to produce an image of the heart. ? A cardiac rhythm monitor. This is used to record your heart rhythm and any events for a longer period of time.  Blood tests.  Genetic testing. General instructions  Do not use any products that contain nicotine or tobacco for at least 4 weeks before the procedure. These products include cigarettes, e-cigarettes, and chewing tobacco. If you  need help quitting, ask your health care provider.  Ask your health care provider: ? How your surgery site will be marked. ? What steps will be taken to help prevent infection. These steps may include:  Removing hair at the surgery site.  Washing skin with a germ-killing soap.  Receiving antibiotic medicine.  Plan to have someone take you home from the hospital or clinic.  If you will be going home right after the procedure, plan to have someone with you for 24 hours. What happens during the procedure?  An IV will be inserted into one of your veins.  You will be given one or more of the following: ? A medicine to help you relax (sedative). ? A medicine to numb the area (local anesthetic). ? A medicine to make you fall asleep (general anesthetic).  The next steps vary depending on the type of pacemaker you will be getting. ? If you are getting a transvenous pacemaker:  An incision  will be made in your upper chest.  A pocket will be made for the pacemaker. It may be placed under the skin or between layers of muscle.  The lead will be inserted into a blood vessel that goes to the heart.  While X-rays are taken by an imaging machine (fluoroscopy), the lead will be advanced through the vein to the inside of your heart.  The other end of the lead will be tunneled under the skin and attached to the pacemaker. ? If you are getting an epicardial pacemaker:  An incision will be made near your ribs or breastbone (sternum) for the lead.  The lead will be attached to the outside of your heart.  Another incision will be made in your chest or upper abdomen to create a pocket for the pacemaker.  The free end of the lead will be tunneled under the skin and attached to the pacemaker.  The transvenous or epicardial pacemaker will be tested. Imaging studies may be done to check the lead position.  The incisions will be closed with stitches (sutures), adhesive strips, or skin  glue.  Bandages (dressings) will be placed over the incisions. The procedure may vary among health care providers and hospitals. What happens after the procedure?  Your blood pressure, heart rate, breathing rate, and blood oxygen level will be monitored until you leave the hospital or clinic.  You may be given antibiotics.  You will be given pain medicine.  An ECG and chest X-rays will be done.  You may need to wear a continuous type of ECG (Holter monitor) to check your heart rhythm.  Your health care provider will program the pacemaker.  If you were given a sedative during the procedure, it can affect you for several hours. Do not drive or operate machinery until your health care provider says that it is safe.  You will be given a pacemaker identification card. This card lists the implant date, device model, and manufacturer of your pacemaker. Summary  A pacemaker is a small computer that sends electrical signals to the heart and helps the heart beat normally.  There are different types of pacemakers. A pacemaker may be placed under the skin or muscle of your chest or abdomen.  Follow instructions from your health care provider about eating and drinking and about taking medicines before the procedure. This information is not intended to replace advice given to you by your health care provider. Make sure you discuss any questions you have with your health care provider. Document Revised: 11/30/2018 Document Reviewed: 11/30/2018 Elsevier Patient Education  2021 [employer].

## 2020-03-15 DIAGNOSIS — R5381 Other malaise: Secondary | ICD-10-CM | POA: Diagnosis not present

## 2020-03-15 DIAGNOSIS — J42 Unspecified chronic bronchitis: Secondary | ICD-10-CM | POA: Diagnosis not present

## 2020-03-20 ENCOUNTER — Ambulatory Visit (HOSPITAL_COMMUNITY): Payer: Medicare Other | Admitting: Physician Assistant

## 2020-03-22 DIAGNOSIS — R5381 Other malaise: Secondary | ICD-10-CM | POA: Diagnosis not present

## 2020-03-22 DIAGNOSIS — J42 Unspecified chronic bronchitis: Secondary | ICD-10-CM | POA: Diagnosis not present

## 2020-03-26 ENCOUNTER — Ambulatory Visit: Payer: Medicare Other | Admitting: Internal Medicine

## 2020-03-27 DIAGNOSIS — G894 Chronic pain syndrome: Secondary | ICD-10-CM | POA: Diagnosis not present

## 2020-03-27 DIAGNOSIS — I10 Essential (primary) hypertension: Secondary | ICD-10-CM | POA: Diagnosis not present

## 2020-03-30 ENCOUNTER — Telehealth: Payer: Self-pay | Admitting: Physician Assistant

## 2020-03-30 NOTE — Telephone Encounter (Signed)
87 year old female with persistent atrial fibrillation, HFpEF, COPD.  She is scheduled for an AV node ablation and pacemaker implantation 3/28 with Dr. Lovena Le.  She has been placed on furosemide for edema.  She called the answering service today with worsening edema.  She gave me permission to speak to her son, Simona Huh, as well.  She notes some increase shortness of breath as well as orthopnea.  She does not weigh herself. PLAN: I advised her to take a total of 40 mg furosemide today and tomorrow. We will have the nurse contact her Monday morning to reassess her condition. Richardson Dopp, PA-C 03/30/2020 1:11 PM

## 2020-04-01 NOTE — Telephone Encounter (Signed)
Can she increase her lasix to 60mg  daily x 2 days and update [place] Wednesday  J Shyanne Mcclary MD

## 2020-04-01 NOTE — Telephone Encounter (Signed)
Contacted patient this morning who states that the swelling in her legs has not improved. She is unable to weigh herself as she is very unsteady on her feet and she is alone at home at the moment. Please advise.

## 2020-04-01 NOTE — Telephone Encounter (Signed)
Patient verbalized understanding and had no other questions or concerns at this time. She will call Wednesday to update [place] on her condition.

## 2020-04-03 ENCOUNTER — Telehealth: Payer: Self-pay | Admitting: Cardiology

## 2020-04-03 NOTE — Telephone Encounter (Signed)
Returned patient call who states that the swelling in her legs, despite the extra lasix, has not improved at all. Patient was concerned about the fluid due to the fact she is having a ICD device placed Monday. Patient is also c/o SOB, but denies CP. Patient had also complained of itching but after further discussion, agreed that it may not be from extra lasix dosing as she had never had that problem before and it was an isolated occurrence. *Patient is unable to weigh herself at home, as she is home alone, unsteady on her feet, and runs the risk of falling* Please advise.

## 2020-04-03 NOTE — Telephone Encounter (Signed)
Patient is suppose to have pacemaker put in Monday , she said her lower legs are extremely swollen below the knee, she is having some shortness of breath.  Patient states that Dr Harl Bowie increased her fluid pills over the weekend for her to take 3 pills per day to get the fluid off and that is not working all its doing is making her itch?    Pt c/o swelling: STAT is pt has developed SOB within 24 hours  1) How much weight have you gained and in what time span? Doesn't know   2) If swelling, where is the swelling located? Lower legs below knee   3) Are you currently taking a fluid pill?  3 fluid pills   4) Are you currently SOB? Some   5) Do you have a log of your daily weights (if so, list)? no  6) Have you gained 3 pounds in a day or 5 pounds in a week? Doesn't know  7) Have you traveled recently? no

## 2020-04-03 NOTE — Telephone Encounter (Signed)
Patient has been booked at 12 pm on 04/04/2020 per Zandra Abts, MD.

## 2020-04-03 NOTE — Telephone Encounter (Signed)
Double book her tomorrow with me at noon.  Zandra Abts MD

## 2020-04-04 ENCOUNTER — Ambulatory Visit: Payer: Medicare Other | Admitting: Cardiology

## 2020-04-04 ENCOUNTER — Other Ambulatory Visit: Payer: Self-pay

## 2020-04-04 ENCOUNTER — Encounter: Payer: Self-pay | Admitting: Cardiology

## 2020-04-04 VITALS — BP 104/58 | HR 106 | Ht 62.0 in | Wt 111.8 lb

## 2020-04-04 DIAGNOSIS — R600 Localized edema: Secondary | ICD-10-CM

## 2020-04-04 DIAGNOSIS — Z79899 Other long term (current) drug therapy: Secondary | ICD-10-CM | POA: Diagnosis not present

## 2020-04-04 MED ORDER — TORSEMIDE 40 MG PO TABS: 40.0000 mg | ORAL_TABLET | Freq: Every day | ORAL | 3 refills | Status: DC

## 2020-04-04 NOTE — Progress Notes (Signed)
Clinical Summary Ms. Innocent is a 87 y.o.female seen today for follow up of the following medical problems. This is a focused visit on recent issues with lower edema, for more detailed history please refer to prior clinic notes.      1. LE edema - recent issues with edema - 09/2019 echo LVEF 70-75%, indet dd - we increased lasix to 60mg  bid - edema has continued. NO significant SOB/DOE.    Past Medical History:  Diagnosis Date  . A-fib ([place])   . Atrial fibrillation ([place])   . Cervicalgia   . Chronic bronchitis with COPD (chronic obstructive pulmonary disease) ([place])   . Colon cancer ([place])   . Degeneration of cervical intervertebral disc   . Hypertension   . Pinched nerve in shoulder   . Rheumatoid arthritis(714.0)   . RUQ pain   . Vulvar dystrophy   . Yeast infection      Allergies  Allergen Reactions  . Iodinated Diagnostic Agents Anaphylaxis    08-12-10 Pt had severe reaction with itching/hives/loss of consciousness within 5 minutes of CEPI today.  She received both Kenalog and Iondinated Contrast, so not certain which agent caused reaction.  If patient returns for another CEPI, Dr. Jobe Igo considers using Decadron and a 13-hour prep.  jkl  . Nerve Block Tray Anaphylaxis    Patient states that she was sent to have Epidural for her shoulder. After rec'ving she remembers saying that she was itching all over  And that's it. She was transported to hospital  By EMS. Stayed overnight.  Bufford Spikes Derivatives Hives and Nausea And Vomiting  . Triamcinolone Acetonide Anaphylaxis    08-12-10  Pt had severe reaction with itching/hives/loss of consciousness within five minutes of CEPI today.  She received both Kenalog and Iodinated Contrast, so not certain which agent caused reaction.  If pt returns for another cervical epidural steroid injection, Dr. Jobe Igo suggests using Decadron and a 13-hr prep.  JKL PATIENT USES PROCTOCREAM 2.5% WITHOUT DIFFICULTY FOR VULVAR DYSTROPHY Patient  says she can take prednisone without any problem  . Vibra-Tab [Doxycycline] Nausea And Vomiting     Current Outpatient Medications  Medication Sig Dispense Refill  . apixaban (ELIQUIS) 2.5 MG TABS tablet Take 1 tablet (2.5 mg total) by mouth 2 (two) times daily. 60 tablet 0  . Carboxymethylcellulose Sodium (REFRESH LIQUIGEL) 1 % GEL Place 1 drop into both eyes in the morning, at noon, in the evening, and at bedtime.    . Cholecalciferol (VITAMIN D) 50 MCG (2000 UT) CAPS Take 2,000 Units by mouth daily.    . furosemide (LASIX) 20 MG tablet Take 1 tablet (20 mg total) by mouth daily. (Patient not taking: Reported on 03/25/2020) 30 tablet 2  . HYDROcodone-acetaminophen (NORCO) 7.5-325 MG tablet Take 1 tablet by mouth 3 (three) times daily as needed for moderate pain. 15 tablet 0  . leflunomide (ARAVA) 20 MG tablet Take 1 tablet (20 mg total) by mouth daily. 30 tablet 0  . metoprolol succinate (TOPROL-XL) 100 MG 24 hr tablet Take 100 mg by mouth daily.    . Omega-3 Fatty Acids (FISH OIL) 1000 MG CAPS Take 1,000 mg by mouth daily.    Marland Kitchen omeprazole (PRILOSEC) 20 MG capsule Take 1 capsule (20 mg total) by mouth in the morning and at bedtime.    . OXYGEN Inhale 2 L into the lungs as needed.    . rosuvastatin (CRESTOR) 20 MG tablet Take 1 tablet (20 mg total) by mouth daily  at 6 PM. 30 tablet 0   No current facility-administered medications for this visit.     Past Surgical History:  Procedure Laterality Date  . ABDOMINAL HYSTERECTOMY  1960'S  . APPENDECTOMY  with hysterectomy  . BACK SURGERY  12/08/10   plate/screws C3 C4  . CARDIOVERSION N/A 09/29/2019   Procedure: CARDIOVERSION;  Surgeon: Arnoldo Lenis, MD;  [facility];  Service: Endoscopy;  Laterality: N/A;  pt ate early this AM per Dr. Harl Bowie  . CARDIOVERSION N/A 02/26/2020   Procedure: CARDIOVERSION;  Surgeon: Sueanne Margarita, MD;  Location: [facility] ENDOSCOPY;  Service: Cardiovascular;  Laterality: N/A;  . CHOLECYSTECTOMY  2002   . COLONOSCOPY  04/02/2009  . COLONOSCOPY  06/04/05  . COLONOSCOPY N/A 07/26/2015   Procedure: COLONOSCOPY;  Surgeon: Rogene Houston, MD;  Location: [facility];  Service: Endoscopy;  Laterality: N/A;  1240  . COLONOSCOPY WITH ESOPHAGOGASTRODUODENOSCOPY (EGD) N/A 04/08/2012   Procedure: COLONOSCOPY WITH ESOPHAGOGASTRODUODENOSCOPY (EGD);  Surgeon: Rogene Houston, MD;  [facility];  Service: Endoscopy;  Laterality: N/A;  130-moved to 245 Ann notified pt  . ESOPHAGOGASTRODUODENOSCOPY N/A 07/26/2015   Procedure: ESOPHAGOGASTRODUODENOSCOPY (EGD);  Surgeon: Rogene Houston, MD;  [facility];  Service: Endoscopy;  Laterality: N/A;  . HEMICOLECTOMY  10/05     Allergies  Allergen Reactions  . Iodinated Diagnostic Agents Anaphylaxis    08-12-10 Pt had severe reaction with itching/hives/loss of consciousness within 5 minutes of CEPI today.  She received both Kenalog and Iondinated Contrast, so not certain which agent caused reaction.  If patient returns for another CEPI, Dr. Jobe Igo considers using Decadron and a 13-hour prep.  jkl  . Nerve Block Tray Anaphylaxis    Patient states that she was sent to have Epidural for her shoulder. After rec'ving she remembers saying that she was itching all over  And that's it. She was transported to hospital  By EMS. Stayed overnight.  Bufford Spikes Derivatives Hives and Nausea And Vomiting  . Triamcinolone Acetonide Anaphylaxis    08-12-10  Pt had severe reaction with itching/hives/loss of consciousness within five minutes of CEPI today.  She received both Kenalog and Iodinated Contrast, so not certain which agent caused reaction.  If pt returns for another cervical epidural steroid injection, Dr. Jobe Igo suggests using Decadron and a 13-hr prep.  JKL PATIENT USES PROCTOCREAM 2.5% WITHOUT DIFFICULTY FOR VULVAR DYSTROPHY Patient says she can take prednisone without any problem  . Vibra-Tab [Doxycycline] Nausea And Vomiting      Family  History  Problem Relation Age of Onset  . Rheum arthritis Mother   . Parkinsonism Father   . Hiatal hernia Sister   . Healthy Brother   . Healthy Daughter   . Healthy Son   . Healthy Son      Social History Ms. Wenig reports that she has never smoked. She has never used smokeless tobacco. Ms. Clouatre reports no history of alcohol use.   Review of Systems CONSTITUTIONAL: No weight loss, fever, chills, weakness or fatigue.  HEENT: Eyes: No visual loss, blurred vision, double vision or yellow sclerae.No hearing loss, sneezing, congestion, runny nose or sore throat.  SKIN: No rash or itching.  CARDIOVASCULAR: no chest pain RESPIRATORY: No shortness of breath, cough or sputum.  GASTROINTESTINAL: No anorexia, nausea, vomiting or diarrhea. No abdominal pain or blood.  GENITOURINARY: No burning on urination, no polyuria NEUROLOGICAL: No headache, dizziness, syncope, paralysis, ataxia, numbness or tingling in the extremities. No change in  bowel or bladder control.  MUSCULOSKELETAL: No muscle, back pain, joint pain or stiffness.  LYMPHATICS: No enlarged nodes. No history of splenectomy.  PSYCHIATRIC: No history of depression or anxiety.  ENDOCRINOLOGIC: No reports of sweating, cold or heat intolerance. No polyuria or polydipsia.  Marland Kitchen   Physical Examination Today's Vitals   04/04/20 1148  BP: (!) 104/58  Pulse: (!) 106  SpO2: 93%  Weight: 111 lb 12.8 oz (50.7 kg)  Height: 5\' 2"  (1.575 m)   Body mass index is 20.45 kg/m.  Gen: resting comfortably, no acute distress HEENT: no scleral icterus, pupils equal round and reactive, no palptable cervical adenopathy,  CV: RRR, no m/r/g, no jvd Resp: Clear to auscultation bilaterally GI: abdomen is soft, non-tender, non-distended, normal bowel sounds, no hepatosplenomegaly MSK: extremities are warm, 1+ bilateral LE edema Skin: warm, no rash Neuro:  no focal deficits Psych: appropriate affect   Diagnostic Studies Echocardiogram:  09/2019 IMPRESSIONS    1. Left ventricular ejection fraction, by estimation, is 70 to 75%. The  left ventricle has normal function. The left ventricle has no regional  wall motion abnormalities. Left ventricular diastolic parameters are  indeterminate.  2. Right ventricular systolic function is normal. The right ventricular  size is normal.  3. Left atrial size was mildly dilated.  4. The mitral valve is normal in structure. Trivial mitral valve  regurgitation. No evidence of mitral stenosis.  5. The aortic valve is tricuspid. Aortic valve regurgitation is mild. No  aortic stenosis is present.  6. The inferior vena cava is normal in size with greater than 50%  respiratory variability, suggesting right atrial pressure of 3 mmHg.     Assessment and Plan  1. LE edema - not improving on lasix, will change to torsemide 40mg  bid x 3 days, then 40mg  daily -check bmet/mg in 1 week     Arnoldo Lenis, M.D.

## 2020-04-04 NOTE — Patient Instructions (Signed)
Medication Instructions:  STOP Lasix START Torsemide 40 mg (40 mg twice a day for three days and then continue 40 mg daily)  *If you need a refill on your cardiac medications before your next appointment, please call your pharmacy*   Lab Work: BMET/MAG- 1 week If you have labs (blood work) drawn today and your tests are completely normal, you will receive your results only by: Marland Kitchen MyChart Message (if you have MyChart) OR . A paper copy in the mail If you have any lab test that is abnormal or we need to change your treatment, we will call you to review the results.   Testing/Procedures: None   Follow-Up: At [facility], you and your health needs are our priority.  As part of our continuing mission to provide you with exceptional heart care, we have created designated Provider Care Teams.  These Care Teams include your primary Cardiologist (physician) and Advanced Practice Providers (APPs -  Physician Assistants and Nurse Practitioners) who all work together to provide you with the care you need, when you need it.  We recommend signing up for the patient portal called "MyChart".  Sign up information is provided on this After Visit Summary.  MyChart is used to connect with patients for Virtual Visits (Telemedicine).  Patients are able to view lab/test results, encounter notes, upcoming appointments, etc.  Non-urgent messages can be sent to your provider as well.   To learn more about what you can do with MyChart, go to [email or web address].    Your next appointment:   3-4 week(s)  The format for your next appointment:   In Person  Provider:   You will see one of the following Advanced Practice Providers on your designated Care Team:    [place], PA-C   Ermalinda Barrios, PA-C       Other Instructions None

## 2020-04-05 ENCOUNTER — Other Ambulatory Visit (HOSPITAL_COMMUNITY)
Admission: RE | Admit: 2020-04-05 | Discharge: 2020-04-05 | Disposition: A | Discharge status: Home / Self Care | Payer: Medicare Other | Source: Ambulatory Visit | Attending: Internal Medicine | Admitting: Internal Medicine

## 2020-04-05 DIAGNOSIS — Z01812 Encounter for preprocedural laboratory examination: Secondary | ICD-10-CM | POA: Diagnosis not present

## 2020-04-05 DIAGNOSIS — Z20822 Contact with and (suspected) exposure to covid-19: Secondary | ICD-10-CM | POA: Insufficient documentation

## 2020-04-05 LAB — SARS CORONAVIRUS 2 (TAT 6-24 HRS): SARS Coronavirus 2: NEGATIVE

## 2020-04-05 NOTE — Pre-Procedure Instructions (Signed)
Instructed patient on the following items: Arrival time 0930 Nothing to eat or drink after midnight No meds AM of procedure Responsible person to drive you home and stay with you for 24 hrs  Have you missed any doses of anti-coagulant Eliquis- last dose 3/25

## 2020-04-08 ENCOUNTER — Other Ambulatory Visit: Payer: Self-pay

## 2020-04-08 ENCOUNTER — Observation Stay (HOSPITAL_COMMUNITY)
Admission: RE | Admit: 2020-04-08 | Discharge: 2020-04-09 | Disposition: A | Discharge status: Home / Self Care | Payer: Medicare Other | Attending: Internal Medicine | Admitting: Internal Medicine

## 2020-04-08 ENCOUNTER — Ambulatory Visit (HOSPITAL_COMMUNITY)
Admission: RE | Disposition: A | Discharge status: Home / Self Care | Payer: Medicare Other | Source: Home / Self Care | Attending: Internal Medicine

## 2020-04-08 DIAGNOSIS — I5032 Chronic diastolic (congestive) heart failure: Secondary | ICD-10-CM | POA: Insufficient documentation

## 2020-04-08 DIAGNOSIS — I110 Hypertensive heart disease with heart failure: Secondary | ICD-10-CM | POA: Insufficient documentation

## 2020-04-08 DIAGNOSIS — Z950 Presence of cardiac pacemaker: Secondary | ICD-10-CM

## 2020-04-08 DIAGNOSIS — Z881 Allergy status to other antibiotic agents status: Secondary | ICD-10-CM | POA: Insufficient documentation

## 2020-04-08 DIAGNOSIS — Z79899 Other long term (current) drug therapy: Secondary | ICD-10-CM | POA: Insufficient documentation

## 2020-04-08 DIAGNOSIS — Z91041 Radiographic dye allergy status: Secondary | ICD-10-CM | POA: Insufficient documentation

## 2020-04-08 DIAGNOSIS — J449 Chronic obstructive pulmonary disease, unspecified: Secondary | ICD-10-CM | POA: Diagnosis not present

## 2020-04-08 DIAGNOSIS — I4821 Permanent atrial fibrillation: Secondary | ICD-10-CM | POA: Diagnosis not present

## 2020-04-08 DIAGNOSIS — M069 Rheumatoid arthritis, unspecified: Secondary | ICD-10-CM | POA: Insufficient documentation

## 2020-04-08 DIAGNOSIS — Z9981 Dependence on supplemental oxygen: Secondary | ICD-10-CM | POA: Insufficient documentation

## 2020-04-08 DIAGNOSIS — J849 Interstitial pulmonary disease, unspecified: Secondary | ICD-10-CM | POA: Diagnosis not present

## 2020-04-08 DIAGNOSIS — Z888 Allergy status to other drugs, medicaments and biological substances status: Secondary | ICD-10-CM | POA: Insufficient documentation

## 2020-04-08 DIAGNOSIS — I4891 Unspecified atrial fibrillation: Secondary | ICD-10-CM | POA: Diagnosis present

## 2020-04-08 DIAGNOSIS — Z7901 Long term (current) use of anticoagulants: Secondary | ICD-10-CM | POA: Insufficient documentation

## 2020-04-08 HISTORY — PX: AV NODE ABLATION: EP1193

## 2020-04-08 HISTORY — PX: PACEMAKER IMPLANT: EP1218

## 2020-04-08 SURGERY — AV NODE ABLATION

## 2020-04-08 MED ORDER — SODIUM CHLORIDE 0.9 % IV SOLN
INTRAVENOUS | Status: AC
  Filled 2020-04-08: qty 2

## 2020-04-08 MED ORDER — CEFAZOLIN SODIUM-DEXTROSE 1-4 GM/50ML-% IV SOLN
1.0000 g | Freq: Four times a day (QID) | INTRAVENOUS | Status: DC
  Administered 2020-04-08 – 2020-04-09 (×2): 1 g via INTRAVENOUS
  Filled 2020-04-08 (×3): qty 50

## 2020-04-08 MED ORDER — LIDOCAINE HCL 1 % IJ SOLN
INTRAMUSCULAR | Status: AC
  Filled 2020-04-08: qty 60

## 2020-04-08 MED ORDER — ONDANSETRON HCL 4 MG/2ML IJ SOLN: 4.0000 mg | Freq: Four times a day (QID) | INTRAMUSCULAR | Status: DC | PRN

## 2020-04-08 MED ORDER — HEPARIN (PORCINE) IN NACL 1000-0.9 UT/500ML-% IV SOLN
INTRAVENOUS | Status: DC | PRN
  Administered 2020-04-08 (×2): 500 mL

## 2020-04-08 MED ORDER — METHYLPREDNISOLONE SODIUM SUCC 125 MG IJ SOLR
125.0000 mg | Freq: Once | INTRAMUSCULAR | Status: AC
  Administered 2020-04-08: 125 mg via INTRAVENOUS
  Filled 2020-04-08: qty 2

## 2020-04-08 MED ORDER — SODIUM CHLORIDE 0.9% FLUSH
3.0000 mL | INTRAVENOUS | Status: DC | PRN
  Administered 2020-04-08: 3 mL via INTRAVENOUS

## 2020-04-08 MED ORDER — SODIUM CHLORIDE 0.9 % IV SOLN: INTRAVENOUS | Status: DC

## 2020-04-08 MED ORDER — CHLORHEXIDINE GLUCONATE 4 % EX LIQD: 4.0000 "application " | Freq: Once | CUTANEOUS | Status: DC

## 2020-04-08 MED ORDER — ACETAMINOPHEN 325 MG PO TABS: 650.0000 mg | ORAL_TABLET | ORAL | Status: DC | PRN

## 2020-04-08 MED ORDER — SODIUM CHLORIDE 0.9 % IV SOLN
250.0000 mL | INTRAVENOUS | Status: DC | PRN
  Administered 2020-04-08: 250 mL via INTRAVENOUS

## 2020-04-08 MED ORDER — LIDOCAINE HCL (PF) 1 % IJ SOLN
INTRAMUSCULAR | Status: AC
  Filled 2020-04-08: qty 30

## 2020-04-08 MED ORDER — SODIUM CHLORIDE 0.9% FLUSH
3.0000 mL | Freq: Two times a day (BID) | INTRAVENOUS | Status: DC
  Administered 2020-04-08: 3 mL via INTRAVENOUS

## 2020-04-08 MED ORDER — CEFAZOLIN SODIUM-DEXTROSE 2-4 GM/100ML-% IV SOLN
INTRAVENOUS | Status: AC
  Filled 2020-04-08: qty 100

## 2020-04-08 MED ORDER — POVIDONE-IODINE 10 % EX SWAB: 2.0000 "application " | Freq: Once | CUTANEOUS | Status: DC

## 2020-04-08 MED ORDER — FENTANYL CITRATE (PF) 100 MCG/2ML IJ SOLN
INTRAMUSCULAR | Status: AC
  Filled 2020-04-08: qty 2

## 2020-04-08 MED ORDER — METOPROLOL TARTRATE 5 MG/5ML IV SOLN
INTRAVENOUS | Status: AC
  Filled 2020-04-08: qty 5

## 2020-04-08 MED ORDER — MIDAZOLAM HCL 5 MG/5ML IJ SOLN
INTRAMUSCULAR | Status: AC
  Filled 2020-04-08: qty 5

## 2020-04-08 MED ORDER — ACETAMINOPHEN 325 MG PO TABS
325.0000 mg | ORAL_TABLET | ORAL | Status: DC | PRN
  Administered 2020-04-08 – 2020-04-09 (×2): 650 mg via ORAL
  Filled 2020-04-08 (×2): qty 2

## 2020-04-08 MED ORDER — CEFAZOLIN SODIUM-DEXTROSE 2-4 GM/100ML-% IV SOLN
2.0000 g | INTRAVENOUS | Status: AC
  Administered 2020-04-08: 2 g via INTRAVENOUS

## 2020-04-08 MED ORDER — DIPHENHYDRAMINE HCL 50 MG/ML IJ SOLN
25.0000 mg | Freq: Once | INTRAMUSCULAR | Status: AC
  Administered 2020-04-08: 25 mg via INTRAVENOUS
  Filled 2020-04-08: qty 1

## 2020-04-08 MED ORDER — MIDAZOLAM HCL 5 MG/5ML IJ SOLN
INTRAMUSCULAR | Status: DC | PRN
  Administered 2020-04-08 (×2): 1 mg via INTRAVENOUS

## 2020-04-08 MED ORDER — SODIUM CHLORIDE 0.9 % IV SOLN
80.0000 mg | INTRAVENOUS | Status: AC
  Administered 2020-04-08: 80 mg

## 2020-04-08 MED ORDER — FENTANYL CITRATE (PF) 100 MCG/2ML IJ SOLN
INTRAMUSCULAR | Status: DC | PRN
  Administered 2020-04-08 (×2): 12.5 ug via INTRAVENOUS

## 2020-04-08 MED ORDER — LIDOCAINE HCL (PF) 1 % IJ SOLN
INTRAMUSCULAR | Status: DC | PRN
  Administered 2020-04-08: 90 mL

## 2020-04-08 MED ORDER — METOPROLOL TARTRATE 5 MG/5ML IV SOLN
INTRAVENOUS | Status: DC | PRN
  Administered 2020-04-08 (×2): 5 mg via INTRAVENOUS

## 2020-04-08 SURGICAL SUPPLY — 14 items
CABLE SURGICAL S-101-97-12 (CABLE) ×1 IMPLANT
CATH CELSIUS THERMO F CV 7FR (ABLATOR) ×1 IMPLANT
CATH RIGHTSITE C315HIS02 (CATHETERS) ×1 IMPLANT
IPG PACE AZUR XT SR MRI W1SR01 (Pacemaker) IMPLANT
LEAD SELECT SECURE 3830 383069 (Lead) IMPLANT
PACE AZURE XT SR MRI W1SR01 (Pacemaker) ×2 IMPLANT
PACK EP LATEX FREE (CUSTOM PROCEDURE TRAY) ×2
PACK EP LF (CUSTOM PROCEDURE TRAY) ×1 IMPLANT
PAD PRO RADIOLUCENT 2001M-C (PAD) ×2 IMPLANT
SELECT SECURE 3830 383069 (Lead) ×2 IMPLANT
SHEATH 7FR PRELUDE SNAP 13 (SHEATH) ×1 IMPLANT
SHEATH PINNACLE 8F 10CM (SHEATH) ×1 IMPLANT
TRAY PACEMAKER INSERTION (PACKS) ×1 IMPLANT
WIRE HI TORQ VERSACORE-J 145CM (WIRE) ×1 IMPLANT

## 2020-04-08 NOTE — Interval H&P Note (Signed)
History and Physical Interval Note:  04/08/2020 10:03 AM  [name]  has presented today for surgery, with the diagnosis of tachybrady.  The various methods of treatment have been discussed with the patient and family. After consideration of risks, benefits and other options for treatment, the patient has consented to  Procedure(s): AV NODE ABLATION (N/A) as a surgical intervention.  The patient's history has been reviewed, patient examined, no change in status, stable for surgery.  I have reviewed the patient's chart and labs.  Questions were answered to the patient's satisfaction.     Cristopher Peru

## 2020-04-08 NOTE — Discharge Instructions (Signed)
Post procedure Right groin care instructions No vigorous or sexual activity for 1 week. Keep procedure site clean & dry. If you notice increased pain, swelling, bleeding or pus, call/return!  You may shower after 24 hours, but no soaking in baths/hot tubs/pools for 1 week.      Supplemental Discharge Instructions for  Pacemaker/Defibrillator Patients   Activity No heavy lifting or vigorous activity with your left/right arm for 6 to 8 weeks.  Do not raise your left/right arm above your head for one week.  Gradually raise your affected arm as drawn below.              04/13/20                      04/14/20                     04/15/20                      04/16/20 __  NO DRIVING until cleared to at your wound check visit  WOUND CARE - Keep the wound area clean and dry.  Do not get this area wet , no showers until cleared to at your wound check visit . - The tape/steri-strips on your wound will fall off; do not pull them off.  No bandage is needed on the site.  DO  NOT apply any creams, oils, or ointments to the wound area. - If you notice any drainage or discharge from the wound, any swelling or bruising at the site, or you develop a fever > 101? F after you are discharged home, call the office at once.  Special Instructions - You are still able to use cellular telephones; use the ear opposite the side where you have your pacemaker/defibrillator.  Avoid carrying your cellular phone near your device. - When traveling through airports, show security personnel your identification card to avoid being screened in the metal detectors.  Ask the security personnel to use the hand wand. - Avoid arc welding equipment, MRI testing (magnetic resonance imaging), TENS units (transcutaneous nerve stimulators).  Call the office for questions about other devices. - Avoid electrical appliances that are in poor condition or are not properly grounded. - Microwave ovens are safe to be near or to operate.

## 2020-04-08 NOTE — Progress Notes (Signed)
SITE AREA: right femoral  SITE PRIOR TO REMOVAL:  LEVEL 0  PRESSURE APPLIED FOR: approximately 12 minutes  MANUAL: yes  PATIENT STATUS DURING PULL: wnl  POST PULL SITE:  LEVEL 0  POST PULL INSTRUCTIONS GIVEN: yes  POST PULL PULSES PRESENT: bilateral pedal pulses palpable at +2, right stronger than left   DRESSING APPLIED: gauze with tegaderm  BEDREST BEGINS @ 1513  COMMENTS:

## 2020-04-08 NOTE — Progress Notes (Signed)
[name] made aware of contrast allergy

## 2020-04-08 NOTE — Progress Notes (Signed)
Spoke with Joelene Millin, pharmacist.  She reviewed the chart the patient's chart, she states patient has tolerated solumedrol before.  Ok to use.

## 2020-04-08 NOTE — Progress Notes (Addendum)
Pt attempting to sit up in bed, lue out of sling, reminded to stay in bed and keep rle straight, pt states she "needs to pee", Purewick placed for patient comfort, repositioned in bed and lue placed back into sling, left upper chest drsg wnl, no bleeding noted and right groin remains soft, no bleeding noted, dressing wnl, safety maintained

## 2020-04-08 NOTE — Discharge Summary (Addendum)
ELECTROPHYSIOLOGY PROCEDURE DISCHARGE SUMMARY    Patient ID: [name],  MRN: [id number], DOB/AGE: 1933-10-21 87 y.o.  Admit date: 04/08/2020 Discharge date: 04/09/20  Primary Care Physician: Sharilyn Sites, MD  Primary Cardiologist: Dr. Harl Bowie Electrophysiologist: Dr. Lovena Le  Primary Discharge Diagnosis:  1. Permanent AFib uncontrolled     CHA2DS2Vasc is 3, on Eliquis appropriately dosed 2. AVNode ablation 3. PPM implant  Secondary Discharge Diagnosis:  1. RA 2. ILD     Chronic bronchitis, COPD 3. Chronic CHF (diastolic)  Allergies  Allergen Reactions   Iodinated Diagnostic Agents Anaphylaxis    08-12-10 Pt had severe reaction with itching/hives/loss of consciousness within 5 minutes of CEPI today.  She received both Kenalog and Iondinated Contrast, so not certain which agent caused reaction.  If patient returns for another CEPI, Dr. Jobe Igo considers using Decadron and a 13-hour prep.  jkl   Nerve Block Tray Anaphylaxis    Patient states that she was sent to have Epidural for her shoulder. After rec'ving she remembers saying that she was itching all over  And that's it. She was transported to hospital  By EMS. Stayed overnight.   Nitrofuran Derivatives Hives and Nausea And Vomiting   Triamcinolone Acetonide Anaphylaxis    08-12-10  Pt had severe reaction with itching/hives/loss of consciousness within five minutes of CEPI today.  She received both Kenalog and Iodinated Contrast, so not certain which agent caused reaction.  If pt returns for another cervical epidural steroid injection, Dr. Jobe Igo suggests using Decadron and a 13-hr prep.  JKL PATIENT USES PROCTOCREAM 2.5% WITHOUT DIFFICULTY FOR VULVAR DYSTROPHY Patient says she can take prednisone without any problem   Vibra-Tab [Doxycycline] Nausea And Vomiting     Procedures This Admission:  1.  Implantation of a MDT dual  chamber PPM on 04/08/20 by Dr Lovena Le.  The patient received , serial number [id number] lead  at left bundle site, and Medtronic single-chamber pacemaker, serial [id number] There were no immediate post procedure complications. CXR on 04/09/20 demonstrated no pneumothorax status post device implantation.   2. 04/08/20, AVNode ablation Conclusion: Successful insertion of a single-chamber pacemaker followed by AV node ablation with left bundle area pacing in a patient with uncontrolled atrial fibrillation and a rapid ventricular response   Brief HPI:* [name] is a 87 y.o. female was referred to electrophysiology in the outpatient setting for consideration of AVNode ablation and PPM implantation with multiple failed AADs and poorly controlled HRs in AF.  Past medical history includes above.  T.  Risks, benefits, and alternatives to AVNode ablation and PPM implantation were reviewed with the patient who wished to proceed.   Hospital Course:  The patient was admitted and underwent implantation of a PPM implant followed by [facility] ablation with details as outlined above.  She was monitored on telemetry overnight which demonstrated AF/VP.  Left chest was without hematoma or ecchymosis.  The device was interrogated and found to be functioning normally.  CXR was obtained and demonstrated no pneumothorax status post device implantation.  R groin is stable.  Wound care, arm mobility, and restrictions were reviewed with the patient.  The patient feels well, denies any CP/SOB, didn't sleep well, looks forward to getting home.  She has minimal site discomfort.  She was examined by Dr. Lovena Le and considered stable for discharge to home.   Resume Eliquis Saturday 04/13/20 Patient on chronic O2 Wheelchair at baseline at home   Physical Exam: Vitals:   04/08/20 2200 04/08/20 2300 04/09/20 0209  04/09/20 0354  BP: 114/70 108/68  107/66  Pulse:    89  Resp:    18  Temp:    97.7 F (36.5 C)  TempSrc:    Oral  SpO2:    95%  Weight:   51.6 kg   Height:        GEN- The patient is well  appearing, alert and oriented x 3 today.   HEENT: normocephalic, atraumatic; sclera clear, conjunctiva pink; hearing intact; oropharynx clear; neck supple, no JVP Lungs-  Very fine crackles throughout, moving air well, no wheezing, normal work of breathing.  No wheezes, rales, rhonchi Heart- RRR, no murmurs, rubs or gallops, PMI not laterally displaced GI- soft, non-tender, non-distended Extremities- no clubbing, cyanosis, or edema, R groin is soft, no hematoma, no bleeding MS- no significant deformity, advanced atrophy Skin- warm and dry, no rash or lesion, left chest without hematoma, mild inferior ecchymosis Psych- euthymic mood, full affect Neuro- no gross deficits   Labs:   Lab Results  Component Value Date   WBC 6.0 03/14/2020   HGB 10.6 (L) 03/14/2020   HCT 31.6 (L) 03/14/2020   MCV 93 03/14/2020   PLT 226 03/14/2020   No results for input(s): NA, K, CL, CO2, BUN, CREATININE, CALCIUM, PROT, BILITOT, ALKPHOS, ALT, AST, GLUCOSE in the last 168 hours.  Invalid input(s): LABALBU  Discharge Medications:  Allergies as of 04/09/2020       Reactions   Iodinated Diagnostic Agents Anaphylaxis   08-12-10 Pt had severe reaction with itching/hives/loss of consciousness within 5 minutes of CEPI today.  She received both Kenalog and Iondinated Contrast, so not certain which agent caused reaction.  If patient returns for another CEPI, Dr. Jobe Igo considers using Decadron and a 13-hour prep.  jkl   Nerve Block Tray Anaphylaxis   Patient states that she was sent to have Epidural for her shoulder. After rec'ving she remembers saying that she was itching all over  And that's it. She was transported to hospital  By EMS. Stayed overnight.   Nitrofuran Derivatives Hives, Nausea And Vomiting   Triamcinolone Acetonide Anaphylaxis   08-12-10  Pt had severe reaction with itching/hives/loss of consciousness within five minutes of CEPI today.  She received both Kenalog and Iodinated Contrast, so not  certain which agent caused reaction.  If pt returns for another cervical epidural steroid injection, Dr. Jobe Igo suggests using Decadron and a 13-hr prep.  JKL PATIENT USES PROCTOCREAM 2.5% WITHOUT DIFFICULTY FOR VULVAR DYSTROPHY Patient says she can take prednisone without any problem   Vibra-tab [doxycycline] Nausea And Vomiting        Medication List     TAKE these medications    apixaban 2.5 MG Tabs tablet Commonly known as: ELIQUIS Take 1 tablet (2.5 mg total) by mouth 2 (two) times daily. Notes to patient: Resume on Saturday 04/13/20   Fish Oil 1000 MG Caps Take 1,000 mg by mouth daily.   HYDROcodone-acetaminophen 7.5-325 MG tablet Commonly known as: NORCO Take 1 tablet by mouth 3 (three) times daily as needed for moderate pain.   leflunomide 20 MG tablet Commonly known as: ARAVA Take 1 tablet (20 mg total) by mouth daily.   metoprolol succinate 100 MG 24 hr tablet Commonly known as: TOPROL-XL Take 100 mg by mouth daily.   omeprazole 20 MG capsule Commonly known as: PRILOSEC Take 1 capsule (20 mg total) by mouth in the morning and at bedtime.   OXYGEN Inhale 2 L into the lungs as needed.  rosuvastatin 20 MG tablet Commonly known as: CRESTOR Take 1 tablet (20 mg total) by mouth daily at 6 PM.   Torsemide 40 MG Tabs Take 40 mg by mouth daily. Take as directed   Vitamin D 50 MCG (2000 UT) Caps Take 2,000 Units by mouth daily.               Discharge Care Instructions  (From admission, onward)           Start     Ordered   04/09/20 0000  Discharge wound care:       Comments: As noted in AVS   04/09/20 0834            Disposition: Home Discharge Instructions     Diet - low sodium heart healthy   Complete by: As directed    Discharge wound care:   Complete by: As directed    As noted in AVS   Increase activity slowly   Complete by: As directed        Follow-up Information     [facility] Follow up.    Specialty: Cardiology Why: 04/20/20 @ 11:20AM, wound check visit Contact information: [address], [address] [place]        Gayleen Friar, PA-C Follow up.   Specialty: Physician Assistant Why: 05/06/20 @ 12:35PM, pacemaker programming visit Contact information: [address] [place] [address] [phone]         Evans Lance, MD Follow up.   Specialty: Cardiology Why: 07/18/20 @ 1:45PM Contact information: [address] [address] [phone]                 Duration of Discharge Encounter: Greater than 30 minutes including physician time.  Venetia Night, PA-C 04/09/2020 8:34 AM  EP Attending  Patient seen and examined. Agree with above. The patient is doing well after PPM insertion and AV node ablation. Interrogation of her PM under my direction demonstrates normal VVI PM function. She has no escape. She is programmed VVI 90/min. [place] for DC home with usual followup.  Carleene Overlie Massimiliano Rohleder,MD

## 2020-04-09 ENCOUNTER — Observation Stay (HOSPITAL_COMMUNITY): Payer: Medicare Other

## 2020-04-09 ENCOUNTER — Encounter (HOSPITAL_COMMUNITY): Payer: Self-pay | Admitting: Internal Medicine

## 2020-04-09 DIAGNOSIS — I110 Hypertensive heart disease with heart failure: Secondary | ICD-10-CM | POA: Diagnosis not present

## 2020-04-09 DIAGNOSIS — I5032 Chronic diastolic (congestive) heart failure: Secondary | ICD-10-CM | POA: Diagnosis not present

## 2020-04-09 DIAGNOSIS — Z950 Presence of cardiac pacemaker: Secondary | ICD-10-CM | POA: Diagnosis not present

## 2020-04-09 DIAGNOSIS — J9811 Atelectasis: Secondary | ICD-10-CM | POA: Diagnosis not present

## 2020-04-09 DIAGNOSIS — Z79899 Other long term (current) drug therapy: Secondary | ICD-10-CM | POA: Diagnosis not present

## 2020-04-09 DIAGNOSIS — Z9981 Dependence on supplemental oxygen: Secondary | ICD-10-CM | POA: Diagnosis not present

## 2020-04-09 DIAGNOSIS — Z888 Allergy status to other drugs, medicaments and biological substances status: Secondary | ICD-10-CM | POA: Diagnosis not present

## 2020-04-09 DIAGNOSIS — J849 Interstitial pulmonary disease, unspecified: Secondary | ICD-10-CM | POA: Diagnosis not present

## 2020-04-09 DIAGNOSIS — M069 Rheumatoid arthritis, unspecified: Secondary | ICD-10-CM | POA: Diagnosis not present

## 2020-04-09 DIAGNOSIS — I4891 Unspecified atrial fibrillation: Secondary | ICD-10-CM | POA: Diagnosis not present

## 2020-04-09 DIAGNOSIS — Z881 Allergy status to other antibiotic agents status: Secondary | ICD-10-CM | POA: Diagnosis not present

## 2020-04-09 DIAGNOSIS — I4821 Permanent atrial fibrillation: Secondary | ICD-10-CM | POA: Diagnosis not present

## 2020-04-09 DIAGNOSIS — J449 Chronic obstructive pulmonary disease, unspecified: Secondary | ICD-10-CM | POA: Diagnosis not present

## 2020-04-09 DIAGNOSIS — Z7901 Long term (current) use of anticoagulants: Secondary | ICD-10-CM | POA: Diagnosis not present

## 2020-04-09 DIAGNOSIS — Z91041 Radiographic dye allergy status: Secondary | ICD-10-CM | POA: Diagnosis not present

## 2020-04-09 MED FILL — Lidocaine HCl Local Preservative Free (PF) Inj 1%: INTRAMUSCULAR | Qty: 30 | Status: AC

## 2020-04-09 MED FILL — Lidocaine HCl Local Inj 1%: INTRAMUSCULAR | Qty: 60 | Status: AC

## 2020-04-12 ENCOUNTER — Other Ambulatory Visit: Payer: Medicare Other | Admitting: Nurse Practitioner

## 2020-04-12 ENCOUNTER — Telehealth: Payer: Self-pay | Admitting: Internal Medicine

## 2020-04-12 ENCOUNTER — Other Ambulatory Visit: Payer: Self-pay

## 2020-04-12 DIAGNOSIS — I4891 Unspecified atrial fibrillation: Secondary | ICD-10-CM | POA: Diagnosis not present

## 2020-04-12 DIAGNOSIS — R451 Restlessness and agitation: Secondary | ICD-10-CM | POA: Diagnosis not present

## 2020-04-12 DIAGNOSIS — Z515 Encounter for palliative care: Secondary | ICD-10-CM

## 2020-04-12 DIAGNOSIS — Z6821 Body mass index (BMI) 21.0-21.9, adult: Secondary | ICD-10-CM | POA: Diagnosis not present

## 2020-04-12 DIAGNOSIS — Z950 Presence of cardiac pacemaker: Secondary | ICD-10-CM | POA: Diagnosis not present

## 2020-04-12 DIAGNOSIS — F419 Anxiety disorder, unspecified: Secondary | ICD-10-CM

## 2020-04-12 NOTE — Telephone Encounter (Signed)
New message    [facility] [address]    Patient has been feeling anxious and agitated since having her pacemaker put in and she is not sleeping , she would like you to call her in something for her anxiety that will calm her down.  She uses [facility] [address]

## 2020-04-12 NOTE — Telephone Encounter (Signed)
I encouraged patient to have a conversation with her pcp, Dr.Golding, as we do not prescribe anxiety or sleep medicine.She will call them.

## 2020-04-12 NOTE — Progress Notes (Addendum)
[facility] Consult Note Telephone: [phone]  Fax: [phone]  PATIENT NAME: [name] [address] [place] [address] [phone] (home)  DOB: March 27, 1933 MRN: [id number]  PRIMARY CARE PROVIDER:    Sharilyn Sites, MD,  [address] [place] [address] [phone]  REFERRING PROVIDER:   Sharilyn Nguyen, [place],  [place] [address] [phone]  RESPONSIBLE PARTY:   Extended Emergency Contact Information Primary Emergency Contact: [name] Mobile Phone: [phone] Relation: Son Secondary Emergency Contact: [name] Address: [address]          [place], [place] [address] [place] Phone: [phone] Mobile Phone: [phone] Relation: Daughter  I met face to face with patient and family in home. Daughter in-law present at visit.  Chief complain: Anxiety  Advance Care Planning: Reviewed ACP with patient. Goal of care: Patient's goal of care is comfort while preserving function.  Directives: She reiterated desire to not be resuscitated in the event of cardiac or respiratory arrest. Signed DNR and MOST form present in home, copy on [facility] EMR. Details of MOST form include limited additional intervention, antibiotics if indicated, IV fluid for a defined trial period, no feeding tube.   I spent 18 minutes providing this consultation, more than 50% of the time in this consultation was spent in counseling and care coordination. ---------------------------------------------------------------------------------  ASSESSMENT AND RECOMMENDATIONS:   Symptom Management:  Anxiety: Report ongoing anxiety since surgery, report difficulty staying asleep, report not sleeping more than 2hrs at a time at night. Report waking up worrying about things, saying she has been worrying a lot. She however report living a good life and happy about how her  children has turned out. Denied suicide or homicidal ideation. Patient report scheduling appointment with PCP today for same compliant. Recommendation: Consider starting patient on Zoloft 25mg  daily and titrate up as needed.  Poor appetite: Patient was prescribed Dronabunol 5mg  for appetite stimulation by her PCP which she report she is not taking due to concerns that it may interact with her other medications. Patient consuming nutritional supplement Ensure, consumes 203 cans a day. Report she has to reduce the quantity due increase in phlem.  Encouraged to continue Ensure as tolerated. Pain: Pain is controled on Hydrocodone- acetaminophen 7.5-325mg  three times a day. Report increased pain in her coccyx from sitting in her wheelchair.  Recommendation: Recommended Gel cushion for wheelchair. Questions and concerns were addressed. Patient and family was encouraged to call with questions and/or concerns.   Family /Caregiver/Community Supports: Patient lives at home alone. She has 4 children who take turns to care for her.   Cognitive / Functional decline: Patient awake, alert, and coherent. She is able to complete her ADLs with minimal assist since her pace maker surgery. Ambulates with a wheelchair, able to wheel self. Family assist with preparing her meals. No report of recent fall.  Follow up Palliative Care Visit: Palliative care will continue to follow for complex decision making and symptom management. Return in about 5 weeks or prn.  History obtained from review of EMR and discussion with patient and family. Records reviewed and summarized bellow.  HISTORY OF PRESENT ILLNESS:  [name] is a 87 y.o. year old female with multiple medical problems including  Chronic Afib (on Eliquis), cervicalgia, pinched nerve in shoulder, HTN, Rheumatoid arthritis (on Leflunomide, and Prednisone tapper), Interstitial lung disease on supplemental oxygen, chronic diastolic CHF, hx of Colon cancer. Patient  is s/p hospitalization from  04/08/2020 to 04/09/2020 for pacemaker placement. Patient currently in home and recovering well. She  denied uncontrolled pain at surgical site, she however report feeling jittery and anxious with difficulty staying asleep at night. Report anxiety is ongoing since after her pacemaker surgery 4 days ago, nothing makes it better or worse. She also endorsed poor appetite. She denied fever, chills, or increased SOB. Rest of 10 point system reviewed and negative.  Palliative Care was asked to help address advance care planning, goals of care, and symptoms management.   CODE STATUS: DNR  PPS: 50%  HOSPICE ELIGIBILITY/DIAGNOSIS: TBD   Physical Exam: Vital signs: BP 100/60, P 90, RR 16, 99% on 3L Current and past weights:111.5lbs down from 115lbs, Ht 75f 2", BMI 20.4kg/m2 General: chronically ill and frail appearing, thin, sitting in her wheelchair in NAD CV: no LE edema, regular rate and rhythm Pulmonary: no increased work of breathing, no cough, no audible wheezes JZS:YFEP all extremities Skin: warm and dry, no rashes, ecchymotic area note to bilateral arms from IV sticks  Neuro: Generalized weakness, otherwise non focal Psych: anxious affecttoday, A and O x 3 Hem/lymph/immuno: no widespread bruising  Lab 03/12/2020 NA 143, K 4, GFR >60, Hgb10.6, HCT 31.6 Reviewed chest x-ray 04/09/2020 IMPRESSION: Pacer placement without pneumothorax or other adverse finding  PAST MEDICAL HISTORY:  Past Medical History:  Diagnosis Date  . A-fib (HCC)   . Atrial fibrillation (HCC)   . Cervicalgia   . Chronic bronchitis with COPD (chronic obstructive pulmonary disease) (HCC)   . Colon cancer (HCC)   . Degeneration of cervical intervertebral disc   . Hypertension   . Pinched nerve in shoulder   . Rheumatoid arthritis(714.0)   . RUQ pain   . Vulvar dystrophy   . Yeast infection     SOCIAL HX:  Social History   Tobacco Use  . Smoking status: Never Smoker  . Smokeless  tobacco: Never Used  Substance Use Topics  . Alcohol use: No   FAMILY HX:  Family History  Problem Relation Age of Onset  . Rheum arthritis Mother   . Parkinsonism Father   . Hiatal hernia Sister   . Healthy Brother   . Healthy Daughter   . Healthy Son   . Healthy Son     ALLERGIES:  Allergies  Allergen Reactions  . Iodinated Diagnostic Agents Anaphylaxis    08-12-10 Pt had severe reaction with itching/hives/loss of consciousness within 5 minutes of CEPI today.  She received both Kenalog and Iondinated Contrast, so not certain which agent caused reaction.  If patient returns for another CEPI, Dr. Alfredo Batty considers using Decadron and a 13-hour prep.  jkl  . Nerve Block Tray Anaphylaxis    Patient states that she was sent to have Epidural for her shoulder. After rec'ving she remembers saying that she was itching all over  And that's it. She was transported to hospital  By EMS. Stayed overnight.  Claudine Mouton Derivatives Hives and Nausea And Vomiting  . Triamcinolone Acetonide Anaphylaxis    08-12-10  Pt had severe reaction with itching/hives/loss of consciousness within five minutes of CEPI today.  She received both Kenalog and Iodinated Contrast, so not certain which agent caused reaction.  If pt returns for another cervical epidural steroid injection, Dr. Alfredo Batty suggests using Decadron and a 13-hr prep.  JKL PATIENT USES PROCTOCREAM 2.5% WITHOUT DIFFICULTY FOR VULVAR DYSTROPHY Patient says she can take prednisone without any problem  . Vibra-Tab [Doxycycline] Nausea And Vomiting     PERTINENT  MEDICATIONS:  Outpatient Encounter Medications as of 04/12/2020  Medication Sig  . apixaban (ELIQUIS) 2.5 MG TABS tablet Take 1 tablet (2.5 mg total) by mouth 2 (two) times daily.  . Cholecalciferol (VITAMIN D) 50 MCG (2000 UT) CAPS Take 2,000 Units by mouth daily.  Marland Kitchen HYDROcodone-acetaminophen (NORCO) 7.5-325 MG tablet Take 1 tablet by mouth 3 (three) times daily as needed for moderate pain.  Marland Kitchen  leflunomide (ARAVA) 20 MG tablet Take 1 tablet (20 mg total) by mouth daily.  . metoprolol succinate (TOPROL-XL) 100 MG 24 hr tablet Take 100 mg by mouth daily.  . Omega-3 Fatty Acids (FISH OIL) 1000 MG CAPS Take 1,000 mg by mouth daily.  Marland Kitchen omeprazole (PRILOSEC) 20 MG capsule Take 1 capsule (20 mg total) by mouth in the morning and at bedtime.  . OXYGEN Inhale 2 L into the lungs as needed.  . rosuvastatin (CRESTOR) 20 MG tablet Take 1 tablet (20 mg total) by mouth daily at 6 PM.  . Torsemide 40 MG TABS Take 40 mg by mouth daily. Take as directed   No facility-administered encounter medications on file as of 04/12/2020.    Thank you for the opportunity to participate in the care of [name]. The palliative care team will continue to follow. Please call our office at [phone] if we can be of additional assistance.  Jari Favre, DNP, AGPCNP-BC

## 2020-04-15 DIAGNOSIS — J42 Unspecified chronic bronchitis: Secondary | ICD-10-CM | POA: Diagnosis not present

## 2020-04-15 DIAGNOSIS — R5381 Other malaise: Secondary | ICD-10-CM | POA: Diagnosis not present

## 2020-04-17 ENCOUNTER — Telehealth: Payer: Self-pay | Admitting: Cardiology

## 2020-04-17 NOTE — Telephone Encounter (Signed)
Per phone call from [place]- with Chronic Care Management at [facility]-   Please give her a call concerning medication changes from previous admission   [phone]

## 2020-04-17 NOTE — Telephone Encounter (Signed)
[name] from Chronic Care Management at [facility] called to get some clarity on patients medications.

## 2020-04-18 ENCOUNTER — Other Ambulatory Visit: Payer: Self-pay

## 2020-04-18 ENCOUNTER — Ambulatory Visit (INDEPENDENT_AMBULATORY_CARE_PROVIDER_SITE_OTHER): Payer: Medicare Other | Admitting: Emergency Medicine

## 2020-04-18 DIAGNOSIS — I4891 Unspecified atrial fibrillation: Secondary | ICD-10-CM | POA: Diagnosis not present

## 2020-04-19 LAB — CUP PACEART INCLINIC DEVICE CHECK
Battery Remaining Longevity: 142 mo
Battery Voltage: 3.21 V
Brady Statistic RV Percent Paced: 99.98 %
Date Time Interrogation Session: 20220408092548
Implantable Lead Implant Date: 20220328
Implantable Lead Location: 753860
Implantable Lead Model: 3830
Implantable Pulse Generator Implant Date: 20220328
Lead Channel Impedance Value: 361 Ohm
Lead Channel Impedance Value: 551 Ohm
Lead Channel Pacing Threshold Amplitude: 0.75 V
Lead Channel Pacing Threshold Pulse Width: 0.4 ms
Lead Channel Sensing Intrinsic Amplitude: 11.125 mV
Lead Channel Setting Pacing Amplitude: 3.5 V
Lead Channel Setting Pacing Pulse Width: 0.4 ms
Lead Channel Setting Sensing Sensitivity: 2 mV

## 2020-04-19 NOTE — Progress Notes (Signed)
Wound check appointment. Steri-strips removed. Wound without redness or edema. Incision edges approximated, wound well healed. Normal device function. Thresholds, sensing, and impedances consistent with implant measurements. Device programmed at 3.5Vfor extra safety margin until 3 month visit. Rate reduced from VP@90  to VP@80  per Dr. Lovena Le request. Histogram distribution appropriate for patient and level of activity. No mode switches or high ventricular rates noted. Patient educated about wound care, arm mobility, lifting restrictions. Patient enrolled in monthly remotes and provided home monitor today. Instructions for manual transmissions reviewed. In office transmission sent and receipt confirmed. Next remote transmission scheduled for 07/08/20. Pt will follow up with EP APP 4/25 post AV ablation and with Dr. Lovena Le 07/18/20 for 91 day follow up.

## 2020-04-22 DIAGNOSIS — J42 Unspecified chronic bronchitis: Secondary | ICD-10-CM | POA: Diagnosis not present

## 2020-04-22 DIAGNOSIS — R5381 Other malaise: Secondary | ICD-10-CM | POA: Diagnosis not present

## 2020-04-25 DIAGNOSIS — H01001 Unspecified blepharitis right upper eyelid: Secondary | ICD-10-CM | POA: Diagnosis not present

## 2020-04-25 DIAGNOSIS — H04123 Dry eye syndrome of bilateral lacrimal glands: Secondary | ICD-10-CM | POA: Diagnosis not present

## 2020-04-25 DIAGNOSIS — H01005 Unspecified blepharitis left lower eyelid: Secondary | ICD-10-CM | POA: Diagnosis not present

## 2020-04-25 DIAGNOSIS — H01002 Unspecified blepharitis right lower eyelid: Secondary | ICD-10-CM | POA: Diagnosis not present

## 2020-04-25 DIAGNOSIS — H01004 Unspecified blepharitis left upper eyelid: Secondary | ICD-10-CM | POA: Diagnosis not present

## 2020-04-29 DIAGNOSIS — B0229 Other postherpetic nervous system involvement: Secondary | ICD-10-CM | POA: Diagnosis not present

## 2020-04-29 DIAGNOSIS — M5137 Other intervertebral disc degeneration, lumbosacral region: Secondary | ICD-10-CM | POA: Diagnosis not present

## 2020-04-29 DIAGNOSIS — G4709 Other insomnia: Secondary | ICD-10-CM | POA: Diagnosis not present

## 2020-04-29 DIAGNOSIS — G894 Chronic pain syndrome: Secondary | ICD-10-CM | POA: Diagnosis not present

## 2020-05-01 NOTE — Progress Notes (Signed)
Cardiology Office Note    Date:  05/02/2020   ID:  [name], DOB 15-Mar-1933, MRN [id number]  PCP:  Sharilyn Sites, MD  Cardiologist: Carlyle Dolly, MD   EP: Dr. Lovena Le   Chief Complaint  Patient presents with  . Follow-up    3 week visit    History of Present Illness:    [name] is a 87 y.o. female with past medical history of paroxysmal atrial fibrillation (s/p multiple DCCV's and failed Multaq as well), interstitial lung disease, rheumatoid arthritis and prior CVA who presents to the office today for 3-week follow-up.  She was examined by Dr. Lovena Le in 03/2020 and given her uncontrolled atrial fibrillation with RVR and having failed antiarrhythmic therapy, AV node ablation with PPM placement was recommended and this was scheduled on 04/08/2020. She did call the office in the interim reporting worsening lower extremity edema and was evaluated by Dr. Harl Bowie on 04/04/2020. Lasix had been increased to 60 mg twice daily with no improvement in her symptoms, therefore she was transitioned to Torsemide 40 mg twice daily for 3 days then 40 mg daily.  She did present to [facility] on 04/08/2020 for her planned EP procedure and received a [employer] PPM and underwent successful AV node ablation. Follow-up CXR showed no evidence of a pneumothorax. She did have a wound check appointment on 04/18/2020 and this appeared well-healing with no drainage. Her device was checked and working appropriately. Was programmed at 3.5Vfor extra safety margin until 3 month visit and rate was reduced from VP@90  to VP@80  per Dr. Tanna Furry request.  In talking with the patient and her son today, she reports feeling anxious since recent pacemaker placement but symptoms have started to improve. Her son feels that she has more energy and is not as short of breath. Denies any specific chest pain, palpitations, orthopnea or PND. Her lower extremity edema has significantly improved since her last visit and she  wishes to stop diuretic therapy if able given the frequent urination.   Past Medical History:  Diagnosis Date  . A-fib ([facility])   . Atrial fibrillation ([facility])   . Cervicalgia   . Chronic bronchitis with COPD (chronic obstructive pulmonary disease) ([facility])   . Colon cancer ([place])   . Degeneration of cervical intervertebral disc   . Hypertension   . Pinched nerve in shoulder   . Rheumatoid arthritis(714.0)   . RUQ pain   . Vulvar dystrophy   . Yeast infection     Past Surgical History:  Procedure Laterality Date  . ABDOMINAL HYSTERECTOMY  1960'S  . APPENDECTOMY  with hysterectomy  . AV NODE ABLATION N/A 04/08/2020   Procedure: AV NODE ABLATION;  Surgeon: Evans Lance, MD;  Location: [facility];  Service: Cardiovascular;  Laterality: N/A;  . BACK SURGERY  12/08/10   plate/screws C3 C4  . CARDIOVERSION N/A 09/29/2019   Procedure: CARDIOVERSION;  Surgeon: Arnoldo Lenis, MD;  [facility];  Service: Endoscopy;  Laterality: N/A;  pt ate early this AM per Dr. Harl Bowie  . CARDIOVERSION N/A 02/26/2020   Procedure: CARDIOVERSION;  Surgeon: Sueanne Margarita, MD;  Location: [facility] ENDOSCOPY;  Service: Cardiovascular;  Laterality: N/A;  . CHOLECYSTECTOMY  2002  . COLONOSCOPY  04/02/2009  . COLONOSCOPY  06/04/05  . COLONOSCOPY N/A 07/26/2015   Procedure: COLONOSCOPY;  Surgeon: Rogene Houston, MD;  Location: [facility];  Service: Endoscopy;  Laterality: N/A;  1240  . COLONOSCOPY WITH ESOPHAGOGASTRODUODENOSCOPY (EGD) N/A  04/08/2012   Procedure: COLONOSCOPY WITH ESOPHAGOGASTRODUODENOSCOPY (EGD);  Surgeon: Rogene Houston, MD;  [facility];  Service: Endoscopy;  Laterality: N/A;  130-moved to 245 Ann notified pt  . ESOPHAGOGASTRODUODENOSCOPY N/A 07/26/2015   Procedure: ESOPHAGOGASTRODUODENOSCOPY (EGD);  Surgeon: Rogene Houston, MD;  [facility];  Service: Endoscopy;  Laterality: N/A;  . HEMICOLECTOMY  10/05  . PACEMAKER IMPLANT N/A 04/08/2020   Procedure:  PACEMAKER IMPLANT;  Surgeon: Evans Lance, MD;  Location: [facility];  Service: Cardiovascular;  Laterality: N/A;    Current Medications: Outpatient Medications Prior to Visit  Medication Sig Dispense Refill  . apixaban (ELIQUIS) 2.5 MG TABS tablet Take 1 tablet (2.5 mg total) by mouth 2 (two) times daily. 60 tablet 0  . Cholecalciferol (VITAMIN D) 50 MCG (2000 UT) CAPS Take 2,000 Units by mouth daily.    Marland Kitchen HYDROcodone-acetaminophen (NORCO) 7.5-325 MG tablet Take 1 tablet by mouth 3 (three) times daily as needed for moderate pain. 15 tablet 0  . leflunomide (ARAVA) 20 MG tablet Take 1 tablet (20 mg total) by mouth daily. 30 tablet 0  . metoprolol succinate (TOPROL-XL) 100 MG 24 hr tablet Take 100 mg by mouth daily.    . Omega-3 Fatty Acids (FISH OIL) 1000 MG CAPS Take 1,000 mg by mouth daily.    Marland Kitchen omeprazole (PRILOSEC) 20 MG capsule Take 1 capsule (20 mg total) by mouth in the morning and at bedtime.    . OXYGEN Inhale 2 L into the lungs as needed.    . rosuvastatin (CRESTOR) 20 MG tablet Take 1 tablet (20 mg total) by mouth daily at 6 PM. 30 tablet 0  . Torsemide 40 MG TABS Take 40 mg by mouth daily. Take as directed 180 tablet 3   No facility-administered medications prior to visit.     Allergies:   Iodinated diagnostic agents, Nerve block tray, Nitrofuran derivatives, Triamcinolone acetonide, and Vibra-tab [doxycycline]   Social History   Socioeconomic History  . Marital status: Married    Spouse name: Not on file  . Number of children: Not on file  . Years of education: Not on file  . Highest education level: Not on file  Occupational History  . Not on file  Tobacco Use  . Smoking status: Never Smoker  . Smokeless tobacco: Never Used  Vaping Use  . Vaping Use: Never used  Substance and Sexual Activity  . Alcohol use: No  . Drug use: No  . Sexual activity: Never  Other Topics Concern  . Not on file  Social History Narrative   Nonsmoker.  She lives alone.  Her  son calls her every day to check on her.  Has a daughter involved in care as well.     Social Determinants of Health   Financial Resource Strain: Not on file  Food Insecurity: Not on file  Transportation Needs: Not on file  Physical Activity: Not on file  Stress: Not on file  Social Connections: Not on file     Family History:  The patient's family history includes Healthy in her brother, daughter, son, and son; Hiatal hernia in her sister; Parkinsonism in her father; Rheum arthritis in her mother.   Review of Systems:   Please see the history of present illness.     General:  No chills, fever, night sweats or weight changes. Positive for fatigue and difficulty sleeping.  Cardiovascular:  No chest pain, dyspnea on exertion, edema, orthopnea, palpitations, paroxysmal nocturnal dyspnea. Dermatological:  No rash, lesions/masses Respiratory: No cough, dyspnea Urologic: No hematuria, dysuria Abdominal:   No nausea, vomiting, diarrhea, bright red blood per rectum, melena, or hematemesis Neurologic:  No visual changes, wkns, changes in mental status. All other systems reviewed and are otherwise negative except as noted above.   Physical Exam:    VS:  BP (!) 124/58   Pulse 80   Ht 5\' 2"  (1.575 m)   Wt 111 lb (50.3 kg)   SpO2 90%   BMI 20.30 kg/m    General: Thin, elderly female appearing in no acute distress. Head: Normocephalic, atraumatic. Neck: No carotid bruits. JVD not elevated.  Lungs: Respirations regular and unlabored, without wheezes or rales.  Heart: Irregularly irregular. No S3 or S4.  No murmur, no rubs, or gallops appreciated. PPM site appears well healing without erythema or drainage.  Abdomen: Appears non-distended. No obvious abdominal masses. Msk:  Strength and tone appear normal for age. No obvious joint deformities or effusions. Extremities: No clubbing or cyanosis. No lower extremity edema.  Distal pedal pulses are 2+ bilaterally. Neuro: Alert and oriented X 3.  Moves all extremities spontaneously. No focal deficits noted. Psych:  Responds to questions appropriately with a normal affect. Skin: No rashes or lesions noted  Wt Readings from Last 3 Encounters:  05/02/20 111 lb (50.3 kg)  04/09/20 113 lb 12.1 oz (51.6 kg)  04/04/20 111 lb 12.8 oz (50.7 kg)     Studies/Labs Reviewed:   EKG:  EKG is not ordered today.   Recent Labs: 08/31/2019: ALT 9 02/15/2020: TSH 0.385 02/16/2020: B Natriuretic Peptide 328.0 05/02/2020: BUN 13; Creatinine, Ser 0.74; Hemoglobin 11.0; Magnesium 1.6; Platelets 156; Potassium 3.2; Sodium 138   Lipid Panel    Component Value Date/Time   CHOL 248 (H) 08/26/2018 0545   TRIG 608 (H) 08/26/2018 0545   HDL 33 (L) 08/26/2018 0545   CHOLHDL 7.5 08/26/2018 0545   VLDL UNABLE TO CALCULATE IF TRIGLYCERIDE OVER 400 mg/dL 08/26/2018 0545   LDLCALC UNABLE TO CALCULATE IF TRIGLYCERIDE OVER 400 mg/dL 08/26/2018 0545   LDLDIRECT 177.9 (H) 08/26/2018 0545    Additional studies/ records that were reviewed today include:   Echocardiogram: 09/29/2019 IMPRESSIONS    1. Left ventricular ejection fraction, by estimation, is 70 to 75%. The  left ventricle has normal function. The left ventricle has no regional  wall motion abnormalities. Left ventricular diastolic parameters are  indeterminate.  2. Right ventricular systolic function is normal. The right ventricular  size is normal.  3. Left atrial size was mildly dilated.  4. The mitral valve is normal in structure. Trivial mitral valve  regurgitation. No evidence of mitral stenosis.  5. The aortic valve is tricuspid. Aortic valve regurgitation is mild. No  aortic stenosis is present.  6. The inferior vena cava is normal in size with greater than 50%  respiratory variability, suggesting right atrial pressure of 3 mmHg.   AV Node Ablation/PPM Placement: 03/2020 Conclusion: Successful insertion of a single-chamber pacemaker followed by AV node ablation with left bundle  area pacing in a patient with uncontrolled atrial fibrillation and a rapid ventricular response   Assessment:    1. Paroxysmal atrial fibrillation (HCC)   2. Medication management   3. Leg edema   4. Hyperlipidemia LDL goal <70      Plan:   In order of problems listed above:  1. Paroxysmal Atrial Fibrillation/Flutter - She recently underwent AV node ablation with PPM placement given she failed DCCV and antiarrhythmic medications in the  past. Family members report her dyspnea has improved but she is having some difficulty with sleep/anxiety which is being managed by her PCP. - Was encouraged to keep scheduled follow-up with EP given reports of possibly readjusting her PPM settings. Remains on Toprol-XL 100mg  daily for now.  - She denies any evidence of active bleeding and remains on Eliquis 2.5mg  BID (appropriate dose given her age of 87 yo and weight < 60 kg). Will recheck CBC as she was anemic with Hgb at 10.6 last month.   2. Lower Extremity Edema - Previously occurring in the setting of atrial fibrillation/flutter with RVR but volume status has improved following her recent procedure. Will reduce Torsemide from 40mg  daily to 20mg  daily. Recheck BMET and Mg today. Might be able to reduce to PRN dosing in the future but will make gradual changes given her recent fluid overload.   3. HLD - Followed by PCP. She remains on Crestor 20mg  daily with goal LDL less than 70 given her prior CVA.    Medication Adjustments/Labs and Tests Ordered: Current medicines are reviewed at length with the patient today.  Concerns regarding medicines are outlined above.  Medication changes, Labs and Tests ordered today are listed in the Patient Instructions below. Patient Instructions  Medication Instructions:  Your physician has recommended you make the following change in your medication:   Decrease Torsemide to 20 mg Daily   *If you need a refill on your cardiac medications before your next  appointment, please call your pharmacy*   Lab Work: Your physician recommends that you return for lab work in: Today ( BMET, Mg, CBC)   If you have labs (blood work) drawn today and your tests are completely normal, you will receive your results only by: Marland Kitchen MyChart Message (if you have MyChart) OR . A paper copy in the mail If you have any lab test that is abnormal or we need to change your treatment, we will call you to review the results.   Testing/Procedures: NONE   Follow-Up: At [facility], you and your health needs are our priority.  As part of our continuing mission to provide you with exceptional heart care, we have created designated Provider Care Teams.  These Care Teams include your primary Cardiologist (physician) and Advanced Practice Providers (APPs -  Physician Assistants and Nurse Practitioners) who all work together to provide you with the care you need, when you need it.  We recommend signing up for the patient portal called "MyChart".  Sign up information is provided on this After Visit Summary.  MyChart is used to connect with patients for Virtual Visits (Telemedicine).  Patients are able to view lab/test results, encounter notes, upcoming appointments, etc.  Non-urgent messages can be sent to your provider as well.   To learn more about what you can do with MyChart, go to [email or web address].    Your next appointment:   3 month(s)  The format for your next appointment:   In Person  Provider:   Carlyle Dolly, MD or Bernerd Pho, PA-C   Other Instructions Thank you for choosing [facility]!       Signed, Erma Heritage, PA-C  05/02/2020 7:05 PM    [facility] [address] [place], [place] [address] Phone: [phone] Fax: [phone]

## 2020-05-02 ENCOUNTER — Encounter: Payer: Self-pay | Admitting: Student

## 2020-05-02 ENCOUNTER — Ambulatory Visit: Payer: Medicare Other | Admitting: Student

## 2020-05-02 ENCOUNTER — Other Ambulatory Visit (HOSPITAL_COMMUNITY)
Admission: RE | Admit: 2020-05-02 | Discharge: 2020-05-02 | Disposition: A | Discharge status: Home / Self Care | Payer: Medicare Other | Source: Ambulatory Visit | Attending: Student | Admitting: Student

## 2020-05-02 ENCOUNTER — Other Ambulatory Visit: Payer: Self-pay

## 2020-05-02 VITALS — BP 124/58 | HR 80 | Ht 62.0 in | Wt 111.0 lb

## 2020-05-02 DIAGNOSIS — R600 Localized edema: Secondary | ICD-10-CM

## 2020-05-02 DIAGNOSIS — I480 Paroxysmal atrial fibrillation: Secondary | ICD-10-CM | POA: Diagnosis not present

## 2020-05-02 DIAGNOSIS — Z79899 Other long term (current) drug therapy: Secondary | ICD-10-CM

## 2020-05-02 DIAGNOSIS — E785 Hyperlipidemia, unspecified: Secondary | ICD-10-CM | POA: Diagnosis not present

## 2020-05-02 LAB — BASIC METABOLIC PANEL
Anion gap: 11 (ref 5–15)
BUN: 13 mg/dL (ref 8–23)
CO2: 30 mmol/L (ref 22–32)
Calcium: 7.3 mg/dL — ABNORMAL LOW (ref 8.9–10.3)
Chloride: 97 mmol/L — ABNORMAL LOW (ref 98–111)
Creatinine, Ser: 0.74 mg/dL (ref 0.44–1.00)
GFR, Estimated: >60 mL/min (ref >60)
Glucose, Bld: 101 mg/dL — ABNORMAL HIGH (ref 70–99)
Potassium: 3.2 mmol/L — ABNORMAL LOW (ref 3.5–5.1)
Sodium: 138 mmol/L (ref 135–145)

## 2020-05-02 LAB — MAGNESIUM: Magnesium: 1.6 mg/dL — ABNORMAL LOW (ref 1.7–2.4)

## 2020-05-02 LAB — CBC
HCT: 34.3 % — ABNORMAL LOW (ref 36.0–46.0)
Hemoglobin: 11 g/dL — ABNORMAL LOW (ref 12.0–15.0)
MCH: 32.3 pg (ref 26.0–34.0)
MCHC: 32.1 g/dL (ref 30.0–36.0)
MCV: 100.6 fL — ABNORMAL HIGH (ref 80.0–100.0)
Platelets: 156 10*3/uL (ref 150–400)
RBC: 3.41 MIL/uL — ABNORMAL LOW (ref 3.87–5.11)
RDW: 14.7 % (ref 11.5–15.5)
WBC: 5.4 10*3/uL (ref 4.0–10.5)
nRBC: 0 % (ref 0.0–0.2)

## 2020-05-02 MED ORDER — TORSEMIDE 20 MG PO TABS: 20.0000 mg | ORAL_TABLET | Freq: Every day | ORAL | 3 refills | Status: DC

## 2020-05-02 NOTE — Patient Instructions (Signed)
Medication Instructions:  Your physician has recommended you make the following change in your medication:   Decrease Torsemide to 20 mg Daily   *If you need a refill on your cardiac medications before your next appointment, please call your pharmacy*   Lab Work: Your physician recommends that you return for lab work in: Today ( BMET, Mg, CBC)   If you have labs (blood work) drawn today and your tests are completely normal, you will receive your results only by: Marland Kitchen MyChart Message (if you have MyChart) OR . A paper copy in the mail If you have any lab test that is abnormal or we need to change your treatment, we will call you to review the results.   Testing/Procedures: NONE   Follow-Up: At [facility], you and your health needs are our priority.  As part of our continuing mission to provide you with exceptional heart care, we have created designated Provider Care Teams.  These Care Teams include your primary Cardiologist (physician) and Advanced Practice Providers (APPs -  Physician Assistants and Nurse Practitioners) who all work together to provide you with the care you need, when you need it.  We recommend signing up for the patient portal called "MyChart".  Sign up information is provided on this After Visit Summary.  MyChart is used to connect with patients for Virtual Visits (Telemedicine).  Patients are able to view lab/test results, encounter notes, upcoming appointments, etc.  Non-urgent messages can be sent to your provider as well.   To learn more about what you can do with MyChart, go to [email or web address].    Your next appointment:   3 month(s)  The format for your next appointment:   In Person  Provider:   Carlyle Dolly, MD or Bernerd Pho, PA-C   Other Instructions Thank you for choosing [facility]!

## 2020-05-03 ENCOUNTER — Telehealth: Payer: Self-pay | Admitting: *Deleted

## 2020-05-03 DIAGNOSIS — Z79899 Other long term (current) drug therapy: Secondary | ICD-10-CM

## 2020-05-03 MED ORDER — MAGNESIUM OXIDE 400 MG PO CAPS: 400.0000 mg | ORAL_CAPSULE | Freq: Every day | ORAL | 3 refills | Status: DC

## 2020-05-03 NOTE — Telephone Encounter (Signed)
-----   Message from Erma Heritage, [place] sent at 05/03/2020  4:25 PM EDT ----- Please let the patient know her hemoglobin has improved as this was previously 10.6 in 03/2020 and now up to 11.0. Her kidney function has remained stable but potassium is slightly low at 3.2. We did reduce her Torsemide dosing yesterday which should help with this as she is not currently on potassium supplementation. Magnesium is low at 1.6. I would recommend that she start Mag Oxide 400 mg daily and recheck a BMET and magnesium in 2 weeks. If potassium remains low despite dose adjustment of Torsemide, we may need to initiate potassium supplementation.

## 2020-05-03 NOTE — Telephone Encounter (Signed)
Pt notified and voiced understanding 

## 2020-05-06 ENCOUNTER — Other Ambulatory Visit: Payer: Self-pay

## 2020-05-06 ENCOUNTER — Ambulatory Visit: Payer: Medicare Other | Admitting: Student

## 2020-05-06 ENCOUNTER — Encounter: Payer: Self-pay | Admitting: Student

## 2020-05-06 VITALS — BP 92/42 | HR 78 | Ht 62.0 in | Wt 111.8 lb

## 2020-05-06 DIAGNOSIS — Z79899 Other long term (current) drug therapy: Secondary | ICD-10-CM | POA: Diagnosis not present

## 2020-05-06 DIAGNOSIS — I442 Atrioventricular block, complete: Secondary | ICD-10-CM

## 2020-05-06 DIAGNOSIS — I4819 Other persistent atrial fibrillation: Secondary | ICD-10-CM | POA: Diagnosis not present

## 2020-05-06 LAB — CUP PACEART INCLINIC DEVICE CHECK
Battery Remaining Longevity: 140 mo
Battery Voltage: 3.21 V
Brady Statistic RV Percent Paced: 99.95 %
Date Time Interrogation Session: 20220425133316
Implantable Lead Implant Date: 20220328
Implantable Lead Location: 753860
Implantable Lead Model: 3830
Implantable Pulse Generator Implant Date: 20220328
Lead Channel Impedance Value: 323 Ohm
Lead Channel Impedance Value: 494 Ohm
Lead Channel Pacing Threshold Amplitude: 0.625 V
Lead Channel Pacing Threshold Pulse Width: 0.4 ms
Lead Channel Sensing Intrinsic Amplitude: 11.125 mV
Lead Channel Setting Pacing Amplitude: 3.5 V
Lead Channel Setting Pacing Pulse Width: 0.4 ms
Lead Channel Setting Sensing Sensitivity: 2 mV

## 2020-05-06 NOTE — Progress Notes (Signed)
Electrophysiology Office Note Date: 05/06/2020  ID:  [name], DOB Oct 03, 1933, MRN [id number]  PCP: Sharilyn Sites, MD Primary Cardiologist: Carlyle Dolly, MD Electrophysiologist: Cristopher Peru, MD   CC: Pacemaker follow-up  [name] is a 87 y.o. female seen today for Cristopher Peru, MD for routine electrophysiology followup.  Since last being seen in our clinic the patient reports doing well.  she denies chest pain, palpitations, dyspnea, PND, orthopnea, nausea, vomiting, dizziness, syncope, edema, weight gain, or early satiety.  Device History: Medtronic Single Chamber PPM implanted 04/08/2020 for permanent AF and AV junction ablation  Past Medical History:  Diagnosis Date  . A-fib ([place])   . Atrial fibrillation ([place])   . Cervicalgia   . Chronic bronchitis with COPD (chronic obstructive pulmonary disease) ([place])   . Colon cancer ([place])   . Degeneration of cervical intervertebral disc   . Hypertension   . Pinched nerve in shoulder   . Rheumatoid arthritis(714.0)   . RUQ pain   . Vulvar dystrophy   . Yeast infection    Past Surgical History:  Procedure Laterality Date  . ABDOMINAL HYSTERECTOMY  1960'S  . APPENDECTOMY  with hysterectomy  . AV NODE ABLATION N/A 04/08/2020   Procedure: AV NODE ABLATION;  Surgeon: Evans Lance, MD;  Location: [place] [facility];  Service: Cardiovascular;  Laterality: N/A;  . BACK SURGERY  12/08/10   plate/screws C3 C4  . CARDIOVERSION N/A 09/29/2019   Procedure: CARDIOVERSION;  Surgeon: Arnoldo Lenis, MD;  [facility];  Service: Endoscopy;  Laterality: N/A;  pt ate early this AM per Dr. Harl Bowie  . CARDIOVERSION N/A 02/26/2020   Procedure: CARDIOVERSION;  Surgeon: Sueanne Margarita, MD;  Location: [facility] ENDOSCOPY;  Service: Cardiovascular;  Laterality: N/A;  . CHOLECYSTECTOMY  2002  . COLONOSCOPY  04/02/2009  . COLONOSCOPY  06/04/05  . COLONOSCOPY N/A 07/26/2015   Procedure: COLONOSCOPY;  Surgeon: Rogene Houston,  MD;  Location: [facility];  Service: Endoscopy;  Laterality: N/A;  1240  . COLONOSCOPY WITH ESOPHAGOGASTRODUODENOSCOPY (EGD) N/A 04/08/2012   Procedure: COLONOSCOPY WITH ESOPHAGOGASTRODUODENOSCOPY (EGD);  Surgeon: Rogene Houston, MD;  [facility];  Service: Endoscopy;  Laterality: N/A;  130-moved to 245 Ann notified pt  . ESOPHAGOGASTRODUODENOSCOPY N/A 07/26/2015   Procedure: ESOPHAGOGASTRODUODENOSCOPY (EGD);  Surgeon: Rogene Houston, MD;  [facility];  Service: Endoscopy;  Laterality: N/A;  . HEMICOLECTOMY  10/05  . PACEMAKER IMPLANT N/A 04/08/2020   Procedure: PACEMAKER IMPLANT;  Surgeon: Evans Lance, MD;  Location: [place] [facility];  Service: Cardiovascular;  Laterality: N/A;    Current Outpatient Medications  Medication Sig Dispense Refill  . apixaban (ELIQUIS) 2.5 MG TABS tablet Take 1 tablet (2.5 mg total) by mouth 2 (two) times daily. 60 tablet 0  . Cholecalciferol (VITAMIN D) 50 MCG (2000 UT) CAPS Take 2,000 Units by mouth daily.    Marland Kitchen HYDROcodone-acetaminophen (NORCO) 7.5-325 MG tablet Take 1 tablet by mouth 3 (three) times daily as needed for moderate pain. 15 tablet 0  . leflunomide (ARAVA) 20 MG tablet Take 1 tablet (20 mg total) by mouth daily. 30 tablet 0  . metoprolol succinate (TOPROL-XL) 100 MG 24 hr tablet Take 100 mg by mouth daily.    . Omega-3 Fatty Acids (FISH OIL) 1000 MG CAPS Take 1,000 mg by mouth daily.    Marland Kitchen omeprazole (PRILOSEC) 20 MG capsule Take 1 capsule (20 mg total) by mouth in the morning and at  bedtime.    . OXYGEN Inhale 2 L into the lungs as needed.    Marland Kitchen QUEtiapine (SEROQUEL) 50 MG tablet Take 25 mg by mouth at bedtime.    . rosuvastatin (CRESTOR) 20 MG tablet Take 1 tablet (20 mg total) by mouth daily at 6 PM. 30 tablet 0  . torsemide (DEMADEX) 20 MG tablet Take 1 tablet (20 mg total) by mouth daily. 90 tablet 3  . zolpidem (AMBIEN) 5 MG tablet Take 2.5 mg by mouth at bedtime as needed.     No current facility-administered  medications for this visit.    Allergies:   Iodinated diagnostic agents, Nerve block tray, Nitrofuran derivatives, Triamcinolone acetonide, and Vibra-tab [doxycycline]   Social History: Social History   Socioeconomic History  . Marital status: Married    Spouse name: Not on file  . Number of children: Not on file  . Years of education: Not on file  . Highest education level: Not on file  Occupational History  . Not on file  Tobacco Use  . Smoking status: Never Smoker  . Smokeless tobacco: Never Used  Vaping Use  . Vaping Use: Never used  Substance and Sexual Activity  . Alcohol use: No  . Drug use: No  . Sexual activity: Never  Other Topics Concern  . Not on file  Social History Narrative   Nonsmoker.  She lives alone.  Her son calls her every day to check on her.  Has a daughter involved in care as well.     Social Determinants of Health   Financial Resource Strain: Not on file  Food Insecurity: Not on file  Transportation Needs: Not on file  Physical Activity: Not on file  Stress: Not on file  Social Connections: Not on file  Intimate Partner Violence: Not on file    Family History: Family History  Problem Relation Age of Onset  . Rheum arthritis Mother   . Parkinsonism Father   . Hiatal hernia Sister   . Healthy Brother   . Healthy Daughter   . Healthy Son   . Healthy Son      Review of Systems: All other systems reviewed and are otherwise negative except as noted above.  Physical Exam: Vitals:   05/06/20 1237  BP: (!) 92/42  Pulse: 78  SpO2: 97%  Weight: 111 lb 12.8 oz (50.7 kg)  Height: 5\' 2"  (1.575 m)     GEN- The patient is well appearing, alert and oriented x 3 today.   HEENT: normocephalic, atraumatic; sclera clear, conjunctiva pink; hearing intact; oropharynx clear; neck supple  Lungs- Clear to ausculation bilaterally, normal work of breathing.  No wheezes, rales, rhonchi Heart- Regular rate and rhythm, no murmurs, rubs or gallops  GI-  soft, non-tender, non-distended, bowel sounds present  Extremities- no clubbing or cyanosis. No edema MS- no significant deformity or atrophy Skin- warm and dry, no rash or lesion; PPM pocket well healed Psych- euthymic mood, full affect Neuro- strength and sensation are intact  PPM Interrogation- reviewed in detail today,  See PACEART report  EKG:  EKG is not ordered today.  Recent Labs: 08/31/2019: ALT 9 02/15/2020: TSH 0.385 02/16/2020: B Natriuretic Peptide 328.0 05/02/2020: BUN 13; Creatinine, Ser 0.74; Hemoglobin 11.0; Magnesium 1.6; Platelets 156; Potassium 3.2; Sodium 138   Wt Readings from Last 3 Encounters:  05/06/20 111 lb 12.8 oz (50.7 kg)  05/02/20 111 lb (50.3 kg)  04/09/20 113 lb 12.1 oz (51.6 kg)     Other studies Reviewed:  Additional studies/ records that were reviewed today include: Previous EP office notes, Previous remote checks, Most recent labwork.   Assessment and Plan:  1.CHB s/p AV nodal ablation with Medtronic PPM  Normal PPM function See Pace Art report LRL changed to 70 bpm with rate response turned on today.   2. Permanent AF Stable on apixaban Rates controlled after AV ablation as above  3. Chronic diastolic CHF Improved with rate control  Current medicines are reviewed at length with the patient today.   The patient does not have concerns regarding her medicines.  The following changes were made today:  none  Labs/ tests ordered today include:  No orders of the defined types were placed in this encounter.    Disposition:   Follow up with Dr. Lovena Le in July as scheduled.    Jacalyn Lefevre, PA-C  05/06/2020 1:36 PM  [place] [address] [phone] (office) [phone] (fax)

## 2020-05-11 DIAGNOSIS — E7849 Other hyperlipidemia: Secondary | ICD-10-CM | POA: Diagnosis not present

## 2020-05-11 DIAGNOSIS — I480 Paroxysmal atrial fibrillation: Secondary | ICD-10-CM | POA: Diagnosis not present

## 2020-05-15 DIAGNOSIS — J42 Unspecified chronic bronchitis: Secondary | ICD-10-CM | POA: Diagnosis not present

## 2020-05-15 DIAGNOSIS — R5381 Other malaise: Secondary | ICD-10-CM | POA: Diagnosis not present

## 2020-05-16 ENCOUNTER — Other Ambulatory Visit: Payer: Self-pay

## 2020-05-16 ENCOUNTER — Other Ambulatory Visit (HOSPITAL_COMMUNITY)
Admission: RE | Admit: 2020-05-16 | Discharge: 2020-05-16 | Disposition: A | Discharge status: Home / Self Care | Payer: Medicare Other | Source: Ambulatory Visit | Attending: Student | Admitting: Student

## 2020-05-16 DIAGNOSIS — Z79899 Other long term (current) drug therapy: Secondary | ICD-10-CM | POA: Diagnosis not present

## 2020-05-16 LAB — BASIC METABOLIC PANEL
Anion gap: 9 (ref 5–15)
BUN: 21 mg/dL (ref 8–23)
CO2: 30 mmol/L (ref 22–32)
Calcium: 7.4 mg/dL — ABNORMAL LOW (ref 8.9–10.3)
Chloride: 100 mmol/L (ref 98–111)
Creatinine, Ser: 0.75 mg/dL (ref 0.44–1.00)
GFR, Estimated: >60 mL/min (ref >60)
Glucose, Bld: 127 mg/dL — ABNORMAL HIGH (ref 70–99)
Potassium: 3.7 mmol/L (ref 3.5–5.1)
Sodium: 139 mmol/L (ref 135–145)

## 2020-05-16 LAB — MAGNESIUM: Magnesium: 1.5 mg/dL — ABNORMAL LOW (ref 1.7–2.4)

## 2020-05-17 ENCOUNTER — Telehealth: Payer: Self-pay

## 2020-05-17 DIAGNOSIS — Z79899 Other long term (current) drug therapy: Secondary | ICD-10-CM

## 2020-05-17 MED ORDER — MAGNESIUM OXIDE 400 MG PO CAPS: 400.0000 mg | ORAL_CAPSULE | Freq: Every day | ORAL | 3 refills | Status: DC

## 2020-05-17 NOTE — Telephone Encounter (Signed)
-----   Message from Erma Heritage, [place] sent at 05/17/2020  9:36 AM EDT ----- Would recommend starting Mag Oxide 400mg  once daily and rechecking Mg level again in 3-4 weeks. Take with food.

## 2020-05-17 NOTE — Telephone Encounter (Signed)
Pt notified and verbalized understanding. Orders placed and medication list updated to reflect medication changes made today.

## 2020-05-21 ENCOUNTER — Other Ambulatory Visit: Payer: Self-pay

## 2020-05-21 ENCOUNTER — Other Ambulatory Visit: Payer: Medicare Other | Admitting: Nurse Practitioner

## 2020-05-21 DIAGNOSIS — G4700 Insomnia, unspecified: Secondary | ICD-10-CM

## 2020-05-21 DIAGNOSIS — Z515 Encounter for palliative care: Secondary | ICD-10-CM | POA: Diagnosis not present

## 2020-05-21 DIAGNOSIS — R12 Heartburn: Secondary | ICD-10-CM | POA: Diagnosis not present

## 2020-05-21 NOTE — Progress Notes (Signed)
[facility] Consult Note Telephone: [phone]  Fax: [phone]    Date of encounter: 05/21/20 PATIENT NAME: [name] [address] [place] [address]   [phone] (home)  DOB: 1933/08/15 MRN: [id number]  PRIMARY CARE PROVIDER:    Sharilyn Sites, MD,  [address] [place] [address] [phone]  REFERRING PROVIDER:   Sharilyn Sites, [place],  [place] [address] [phone]  RESPONSIBLE PARTY:    Contact Information    Name Relation Home Work [place] Son   [place] Daughter [phone]  [phone]   ELBIA, PARO   [phone]   mell, mellott   [phone]     I met face to face with patient in home/facility. Palliative Care was asked to follow this patient by consultation request of  Sharilyn Sites, MD to address advance care planning and complex medical decision making. This is a follow up visit.                                  ASSESSMENT AND PLAN / RECOMMENDATIONS:   Advance Care Planning/Goals of Care:  CODE STATUS: DNR Goal of care: Patient's goal of care is comfort while preserving function.  Directive: Signed DNR and MOST forms present in home copy on [facility] EMR. Details of MOST form include limited additional intervention, antibiotics if indicated, IV fluid for a defined trial period, no feeding tube.    Symptom Management/Plan: Heart burn: No report of alarm symptoms such as dysphagia, odynophagia, bleeding. Continue Omeprazole 20mg  twice a day. Advised to start taking Tums as needed, not to take more 6 Tums in a day. If no improvement in symptoms may consider switching patient to another PPI like Pantoprazole. Patient verbalized agreement with plan.   Insomnia:  Patient is prescribed Ambien 5mg  and seroquel 50mg  at bed time. Patient report not taking med as prescribed. Report taking only half tablet of Ambien  and sometimes take the Seroquel. Report not taking the med on the same day because of fear of overdose. Encouraged compliance with medication regimen as prescribed. Patient verbalized understanding. Provided general support and encouragement. Patient advised to call if any questions or concerns.  Follow up Palliative Care Visit: Palliative care will continue to follow for complex medical decision making, advance care planning, and clarification of goals. Return in about 4 weeks or prn.  PPS: 50% weak  HOSPICE ELIGIBILITY/DIAGNOSIS: TBD  CHIEF COMPLAIN: Heart burn  History obtained from review of Epic EMR and discussion with Ms. Yadav.   HISTORY OF PRESENT ILLNESS:[name] a 87 y.o.year old femalewith complaint of heart burn in the context of GERD. She report symptoms is ongoing and has worsened in the last 3-4 weeks. Symptoms is aggravated by hunger, relieved with food. She denied nausea, denied vomiting, denied bloating or abdominal pain. Patient currently on Omeprazole 20mg  twice day. Patient report ongoing insomnia. Ten systems reviewed and are negative for acute change, except as noted in the HPI.   Patient other medical problems include Chronic Afib (on Eliquis, s/p pace maker), cervicalgia, pinched nerve in shoulder, HTN, Rheumatoid arthritis (on Leflunomide),Interstitial lung disease on supplemental oxygen, chronic diastolic CHF, hx of Colon cancer.   I reviewed available labs, medications, imaging, studies and related documents from the EMR.  Records reviewed and summarized above.   Physical Exam: Vital signs: BP 138/70, P 76, RR  18, 92% on 3L General:chronically ill andfrail appearing, thin, sitting in her wheelchair in NAD KD:XIPJ edema, S1S2 normal Pulmonary: no increased work of breathing, no cough, no audible wheezes Abdomen: Soft, none tender ASN:KNLZ all extremities Skin: warm and dry, no rashes or wounds noted on exposed skin Neuro: Generalized  weakness, otherwise non focal Psych: non-anxious affecttoday, A and Ox 3 Hem/lymph/immuno: no widespread bruising  Past Medical History:  Diagnosis Date  . A-fib ([place])   . Atrial fibrillation ([place])   . Cervicalgia   . Chronic bronchitis with COPD (chronic obstructive pulmonary disease) ([place])   . Colon cancer ([place])   . Degeneration of cervical intervertebral disc   . Hypertension   . Pinched nerve in shoulder   . Rheumatoid arthritis(714.0)   . RUQ pain   . Vulvar dystrophy   . Yeast infection    Current Outpatient Medications on File Prior to Visit  Medication Sig Dispense Refill  . apixaban (ELIQUIS) 2.5 MG TABS tablet Take 1 tablet (2.5 mg total) by mouth 2 (two) times daily. 60 tablet 0  . Cholecalciferol (VITAMIN D) 50 MCG (2000 UT) CAPS Take 2,000 Units by mouth daily.    Marland Kitchen HYDROcodone-acetaminophen (NORCO) 7.5-325 MG tablet Take 1 tablet by mouth 3 (three) times daily as needed for moderate pain. 15 tablet 0  . leflunomide (ARAVA) 20 MG tablet Take 1 tablet (20 mg total) by mouth daily. 30 tablet 0  . Magnesium Oxide 400 MG CAPS Take 1 capsule (400 mg total) by mouth daily. 90 capsule 3  . metoprolol succinate (TOPROL-XL) 100 MG 24 hr tablet Take 100 mg by mouth daily.    . Omega-3 Fatty Acids (FISH OIL) 1000 MG CAPS Take 1,000 mg by mouth daily.    Marland Kitchen omeprazole (PRILOSEC) 20 MG capsule Take 1 capsule (20 mg total) by mouth in the morning and at bedtime.    . OXYGEN Inhale 2 L into the lungs as needed.    Marland Kitchen QUEtiapine (SEROQUEL) 50 MG tablet Take 25 mg by mouth at bedtime.    . rosuvastatin (CRESTOR) 20 MG tablet Take 1 tablet (20 mg total) by mouth daily at 6 PM. 30 tablet 0  . torsemide (DEMADEX) 20 MG tablet Take 1 tablet (20 mg total) by mouth daily. 90 tablet 3  . zolpidem (AMBIEN) 5 MG tablet Take 2.5 mg by mouth at bedtime as needed.     No current facility-administered medications on file prior to visit.   Thank you for the opportunity to participate in the care  of Ms. Kreisler.  The palliative care team will continue to follow. Please call our office at [phone] if we can be of additional assistance.   Jari Favre,  DNP, AGPCNP-BC  COVID-19 PATIENT SCREENING TOOL Asked and negative response unless otherwise noted:   Have you had symptoms of covid, tested positive or been in contact with someone with symptoms/positive test in the past 5-10 days?

## 2020-05-22 DIAGNOSIS — J42 Unspecified chronic bronchitis: Secondary | ICD-10-CM | POA: Diagnosis not present

## 2020-05-22 DIAGNOSIS — R5381 Other malaise: Secondary | ICD-10-CM | POA: Diagnosis not present

## 2020-05-27 ENCOUNTER — Telehealth: Payer: Self-pay | Admitting: Nurse Practitioner

## 2020-05-29 DIAGNOSIS — K219 Gastro-esophageal reflux disease without esophagitis: Secondary | ICD-10-CM | POA: Diagnosis not present

## 2020-05-29 DIAGNOSIS — Z6821 Body mass index (BMI) 21.0-21.9, adult: Secondary | ICD-10-CM | POA: Diagnosis not present

## 2020-05-29 DIAGNOSIS — G894 Chronic pain syndrome: Secondary | ICD-10-CM | POA: Diagnosis not present

## 2020-05-29 DIAGNOSIS — G4701 Insomnia due to medical condition: Secondary | ICD-10-CM | POA: Diagnosis not present

## 2020-06-12 ENCOUNTER — Encounter (INDEPENDENT_AMBULATORY_CARE_PROVIDER_SITE_OTHER): Payer: Self-pay | Admitting: *Deleted

## 2020-06-15 DIAGNOSIS — J42 Unspecified chronic bronchitis: Secondary | ICD-10-CM | POA: Diagnosis not present

## 2020-06-15 DIAGNOSIS — R5381 Other malaise: Secondary | ICD-10-CM | POA: Diagnosis not present

## 2020-06-27 DIAGNOSIS — G894 Chronic pain syndrome: Secondary | ICD-10-CM | POA: Diagnosis not present

## 2020-06-27 DIAGNOSIS — M0689 Other specified rheumatoid arthritis, multiple sites: Secondary | ICD-10-CM | POA: Diagnosis not present

## 2020-06-27 DIAGNOSIS — I4891 Unspecified atrial fibrillation: Secondary | ICD-10-CM | POA: Diagnosis not present

## 2020-07-08 ENCOUNTER — Ambulatory Visit (INDEPENDENT_AMBULATORY_CARE_PROVIDER_SITE_OTHER): Payer: Medicare Other

## 2020-07-08 DIAGNOSIS — I442 Atrioventricular block, complete: Secondary | ICD-10-CM

## 2020-07-08 LAB — CUP PACEART REMOTE DEVICE CHECK
Battery Remaining Longevity: 156 mo
Battery Voltage: 3.2 V
Brady Statistic RV Percent Paced: 99.95 %
Date Time Interrogation Session: 20220627062620
Implantable Lead Implant Date: 20220328
Implantable Lead Location: 753860
Implantable Lead Model: 3830
Implantable Pulse Generator Implant Date: 20220328
Lead Channel Impedance Value: 304 Ohm
Lead Channel Impedance Value: 475 Ohm
Lead Channel Pacing Threshold Amplitude: 0.625 V
Lead Channel Pacing Threshold Pulse Width: 0.4 ms
Lead Channel Sensing Intrinsic Amplitude: 11.125 mV
Lead Channel Setting Pacing Amplitude: 2 V
Lead Channel Setting Pacing Pulse Width: 0.4 ms
Lead Channel Setting Sensing Sensitivity: 2 mV

## 2020-07-18 ENCOUNTER — Encounter: Payer: Medicare Other | Admitting: Internal Medicine

## 2020-07-24 NOTE — Progress Notes (Signed)
Remote pacemaker transmission.   

## 2020-07-30 ENCOUNTER — Telehealth: Payer: Self-pay | Admitting: *Deleted

## 2020-07-30 NOTE — Telephone Encounter (Signed)
Copy of OOP expenses faxed to [employer] patient assistance. Total is $458.46.

## 2020-07-31 NOTE — Progress Notes (Signed)
Cardiology Office Note    Date:  08/01/2020   ID:  [name], DOB [birth year], MRN [id number]  PCP:  Sharilyn Sites, MD  Cardiologist: Carlyle Dolly, MD   EP: Dr. Lovena Le  Chief Complaint  Patient presents with   Follow-up    3 month visit    History of Present Illness:    [name] is a 87 y.o. female with past medical history of paroxysmal atrial fibrillation (s/p multiple DCCV's and failed Multaq as well, s/p AV node ablation with PPM placement in 03/2020), interstitial lung disease, rheumatoid arthritis and prior CVA who presents to the office today for 8-month follow-up.  She was examined by myself in 04/2020 and had felt anxious since PPM placement but reported her symptoms had started to improve. She did report having more energy and was not as short of breath. She reported improvement in her lower extremity edema and Torsemide was reduced from 40 mg daily to 20 mg daily. Follow-up labs showed her kidney function remained stable but magnesium was low at 1.5 and she was started on Mag Oxide 400 mg daily with repeat labs recommended in 3 to 4 weeks.  In talking with the patient and her son today, she reports she hit her head earlier this week after her wheelchair tipped backwards at a [employer]. Her back has been sore since but she did not wish to seek evaluation due to no neurologic issues at that time. Denies any syncopal episodes. Denies any recent chest pain or palpitations. She does report fatigue and her family questions if this is possibly due to Lopressor or due to her prior CVAs.  She reports good compliance with Eliquis and denies any evidence of active bleeding. She is concerned about the cost of this going forward as she has not yet been approved for patient assistance.   Past Medical History:  Diagnosis Date   A-fib [facility])    Atrial fibrillation (HCC)    Cervicalgia    Chronic bronchitis with COPD (chronic obstructive pulmonary disease) (HCC)     Colon cancer (HCC)    Degeneration of cervical intervertebral disc    Hypertension    Pinched nerve in shoulder    Rheumatoid arthritis(714.0)    RUQ pain    Vulvar dystrophy    Yeast infection     Past Surgical History:  Procedure Laterality Date   ABDOMINAL HYSTERECTOMY  1960'S   APPENDECTOMY  with hysterectomy   AV NODE ABLATION N/A 04/08/2020   Procedure: AV NODE ABLATION;  Surgeon: Evans Lance, MD;  Location: [place];  Service: Cardiovascular;  Laterality: N/A;   BACK SURGERY  12/08/10   plate/screws C3 C4   CARDIOVERSION N/A 09/29/2019   Procedure: CARDIOVERSION;  Surgeon: Arnoldo Lenis, MD;  Location: [facility];  Service: Endoscopy;  Laterality: N/A;  pt ate early this AM per Dr. Harl Bowie   CARDIOVERSION N/A 02/26/2020   Procedure: CARDIOVERSION;  Surgeon: Sueanne Margarita, MD;  Location: [facility] ENDOSCOPY;  Service: Cardiovascular;  Laterality: N/A;   CHOLECYSTECTOMY  2002   COLONOSCOPY  04/02/2009   COLONOSCOPY  06/04/05   COLONOSCOPY N/A 07/26/2015   Procedure: COLONOSCOPY;  Surgeon: Rogene Houston, MD;  Location: [facility];  Service: Endoscopy;  Laterality: N/A;  1240   COLONOSCOPY WITH ESOPHAGOGASTRODUODENOSCOPY (EGD) N/A 04/08/2012   Procedure: COLONOSCOPY WITH ESOPHAGOGASTRODUODENOSCOPY (EGD);  Surgeon: Rogene Houston, MD;  Location: [facility];  Service: Endoscopy;  Laterality: N/A;  130-moved  to [place] notified pt   ESOPHAGOGASTRODUODENOSCOPY N/A 07/26/2015   Procedure: ESOPHAGOGASTRODUODENOSCOPY (EGD);  Surgeon: Rogene Houston, MD;  Location: [facility];  Service: Endoscopy;  Laterality: N/A;   HEMICOLECTOMY  10/05   PACEMAKER IMPLANT N/A 04/08/2020   Procedure: PACEMAKER IMPLANT;  Surgeon: Evans Lance, MD;  Location: [facility];  Service: Cardiovascular;  Laterality: N/A;    Current Medications: Outpatient Medications Prior to Visit  Medication Sig Dispense Refill   apixaban (ELIQUIS) 2.5 MG TABS tablet Take 1 tablet (2.5  mg total) by mouth 2 (two) times daily. 60 tablet 0   Cholecalciferol (VITAMIN D) 50 MCG (2000 UT) CAPS Take 2,000 Units by mouth daily.     HYDROcodone-acetaminophen (NORCO/VICODIN) 5-325 MG tablet Take 1 tablet by mouth 2 (two) times daily as needed.     leflunomide (ARAVA) 20 MG tablet Take 1 tablet (20 mg total) by mouth daily. 30 tablet 0   Magnesium Oxide 400 MG CAPS Take 1 capsule (400 mg total) by mouth daily. 90 capsule 3   Omega-3 Fatty Acids (FISH OIL) 1000 MG CAPS Take 1,000 mg by mouth daily.     omeprazole (PRILOSEC) 20 MG capsule Take 1 capsule (20 mg total) by mouth in the morning and at bedtime.     rosuvastatin (CRESTOR) 20 MG tablet Take 1 tablet (20 mg total) by mouth daily at 6 PM. 30 tablet 0   torsemide (DEMADEX) 20 MG tablet Take 1 tablet (20 mg total) by mouth daily. 90 tablet 3   metoprolol succinate (TOPROL-XL) 100 MG 24 hr tablet Take 100 mg by mouth daily.     HYDROcodone-acetaminophen (NORCO) 7.5-325 MG tablet Take 1 tablet by mouth 3 (three) times daily as needed for moderate pain. (Patient not taking: Reported on 08/01/2020) 15 tablet 0   OXYGEN Inhale 2 L into the lungs as needed. (Patient not taking: Reported on 08/01/2020)     QUEtiapine (SEROQUEL) 50 MG tablet Take 25 mg by mouth at bedtime. (Patient not taking: Reported on 08/01/2020)     zolpidem (AMBIEN) 5 MG tablet Take 2.5 mg by mouth at bedtime as needed. (Patient not taking: Reported on 08/01/2020)     No facility-administered medications prior to visit.     Allergies:   Iodinated diagnostic agents, Nerve block tray, Nitrofuran derivatives, Triamcinolone acetonide, and Vibra-tab [doxycycline]   Social History   Socioeconomic History   Marital status: Married    Spouse name: Not on file   Number of children: Not on file   Years of education: Not on file   Highest education level: Not on file  Occupational History   Not on file  Tobacco Use   Smoking status: Never   Smokeless tobacco: Never   Vaping Use   Vaping Use: Never used  Substance and Sexual Activity   Alcohol use: No   Drug use: No   Sexual activity: Never  Other Topics Concern   Not on file  Social History Narrative   Nonsmoker.  She lives alone.  Her son calls her every day to check on her.  Has a daughter involved in care as well.     Social Determinants of Health   Financial Resource Strain: Not on file  Food Insecurity: Not on file  Transportation Needs: Not on file  Physical Activity: Not on file  Stress: Not on file  Social Connections: Not on file     Family History:  The patient's family history includes Healthy in her  brother, daughter, son, and son; Hiatal hernia in her sister; Parkinsonism in her father; Rheum arthritis in her mother.   Review of Systems:    Please see the history of present illness.     All other systems reviewed and are otherwise negative except as noted above.   Physical Exam:    VS:  BP 134/74   Pulse 73   Ht 5\' 2"  (1.575 m)   Wt 107 lb (48.5 kg)   SpO2 97%   BMI 19.57 kg/m    General: Thin elderly female appearing in no acute distress. Head: Normocephalic, atraumatic. Neck: No carotid bruits. JVD not elevated.  Lungs: Respirations regular and unlabored, without wheezes or rales.  Heart: Regular rate and rhythm. No S3 or S4.  No murmur, no rubs, or gallops appreciated. Abdomen: Appears non-distended. No obvious abdominal masses. Msk:  Strength and tone appear normal for age. No obvious joint deformities or effusions. Extremities: No clubbing or cyanosis. No pitting edema.  Distal pedal pulses are 2+ bilaterally. Neuro: Alert and oriented X 3. Moves all extremities spontaneously. No focal deficits noted. Psych:  Responds to questions appropriately with a normal affect. Skin: No rashes or lesions noted  Wt Readings from Last 3 Encounters:  08/01/20 107 lb (48.5 kg)  05/06/20 111 lb 12.8 oz (50.7 kg)  05/02/20 111 lb (50.3 kg)     Studies/Labs Reviewed:    EKG:  EKG is not ordered today.    Recent Labs: 08/31/2019: ALT 9 02/15/2020: TSH 0.385 02/16/2020: B Natriuretic Peptide 328.0 05/02/2020: Hemoglobin 11.0; Platelets 156 05/16/2020: BUN 21; Creatinine, Ser 0.75; Magnesium 1.5; Potassium 3.7; Sodium 139   Lipid Panel    Component Value Date/Time   CHOL 248 (H) 08/26/2018 0545   TRIG 608 (H) 08/26/2018 0545   HDL 33 (L) 08/26/2018 0545   CHOLHDL 7.5 08/26/2018 0545   VLDL UNABLE TO CALCULATE IF TRIGLYCERIDE OVER 400 mg/dL 08/26/2018 0545   LDLCALC UNABLE TO CALCULATE IF TRIGLYCERIDE OVER 400 mg/dL 08/26/2018 0545   LDLDIRECT 177.9 (H) 08/26/2018 0545    Additional studies/ records that were reviewed today include:   NST: 10/2018 Equivocal, nondiagnostic ST segment depression noted with persistent T wave inversions. Small, moderate intensity, mid to basal inferoseptal defect that is fixed and most prominent on the rest imaging. This is consistent with soft tissue attenuation. No reversible perfusion defects to indicate ischemia. This is a low risk study. Nuclear stress EF: 72%.  Echocardiogram: 09/2019 IMPRESSIONS     1. Left ventricular ejection fraction, by estimation, is 70 to 75%. The  left ventricle has normal function. The left ventricle has no regional  wall motion abnormalities. Left ventricular diastolic parameters are  indeterminate.   2. Right ventricular systolic function is normal. The right ventricular  size is normal.   3. Left atrial size was mildly dilated.   4. The mitral valve is normal in structure. Trivial mitral valve  regurgitation. No evidence of mitral stenosis.   5. The aortic valve is tricuspid. Aortic valve regurgitation is mild. No  aortic stenosis is present.   6. The inferior vena cava is normal in size with greater than 50%  respiratory variability, suggesting right atrial pressure of 3 mmHg.   Assessment:    1. Paroxysmal atrial fibrillation (HCC)   2. Leg edema   3. Hyperlipidemia LDL  goal <70      Plan:   In order of problems listed above:  1. Paroxysmal Atrial Fibrillation - She denies any recent palpitations  but has continued to experience fatigue and she is unsure if this is due to her Lopressor or a sequela of prior CVA's. Given that she is now s/p AV node ablation with PPM placement in 03/2020, we reviewed we could try decreasing Toprol-XL from 100 mg daily to 50 mg daily to see if this helps with her symptoms. - She denies any evidence of active bleeding. Remains on Eliquis 2.5 mg twice daily for anticoagulation which is the appropriate dose at this time given her age of 41 and weight at 107 lbs. Samples were provided today as her paperwork for patient assistance is currently pending.  2. Lower Extremity Edema - She denies any recent orthopnea, PND or pitting edema. Continue Torsemide 20 mg daily.  Creatinine was stable at 0.75 by recent labs in 05/2020.  3. HLD - Followed by her PCP. She remains on Crestor 20 mg daily with goal LDL less than 70 given her prior CVA.     Medication Adjustments/Labs and Tests Ordered: Current medicines are reviewed at length with the patient today.  Concerns regarding medicines are outlined above.  Medication changes, Labs and Tests ordered today are listed in the Patient Instructions below. Patient Instructions  Medication Instructions:  Your physician has recommended you make the following change in your medication:  DECREASE Toprol-XL to 50 mg tablets daily  *If you need a refill on your cardiac medications before your next appointment, please call your pharmacy*   Lab Work: None If you have labs (blood work) drawn today and your tests are completely normal, you will receive your results only by: [place] (if you have MyChart) OR A paper copy in the mail If you have any lab test that is abnormal or we need to change your treatment, we will call you to review the  results.   Testing/Procedures: None   Follow-Up: At [facility], you and your health needs are our priority.  As part of our continuing mission to provide you with exceptional heart care, we have created designated Provider Care Teams.  These Care Teams include your primary Cardiologist (physician) and Advanced Practice Providers (APPs -  Physician Assistants and Nurse Practitioners) who all work together to provide you with the care you need, when you need it.  We recommend signing up for the patient portal called "MyChart".  Sign up information is provided on this After Visit Summary.  MyChart is used to connect with patients for Virtual Visits (Telemedicine).  Patients are able to view lab/test results, encounter notes, upcoming appointments, etc.  Non-urgent messages can be sent to your provider as well.   To learn more about what you can do with MyChart, go to [email or web address].    Your next appointment:   3 month(s)  The format for your next appointment:   In Person  Provider:   Bernerd Pho, PA-C Carlyle Dolly, MD  Other Instructions Eliquis 2.5 mg Sample: Lot #: [id number] Exp: 01/2021   Signed, Erma Heritage, PA-C  08/01/2020 1:49 PM    [facility] [address] [place], [place] [address] Phone: [phone] Fax: [phone]

## 2020-08-01 ENCOUNTER — Other Ambulatory Visit: Payer: Self-pay

## 2020-08-01 ENCOUNTER — Encounter: Payer: Self-pay | Admitting: Student

## 2020-08-01 ENCOUNTER — Ambulatory Visit: Payer: Medicare Other | Admitting: Student

## 2020-08-01 VITALS — BP 134/74 | HR 73 | Ht 62.0 in | Wt 107.0 lb

## 2020-08-01 DIAGNOSIS — I480 Paroxysmal atrial fibrillation: Secondary | ICD-10-CM

## 2020-08-01 DIAGNOSIS — R600 Localized edema: Secondary | ICD-10-CM

## 2020-08-01 DIAGNOSIS — E785 Hyperlipidemia, unspecified: Secondary | ICD-10-CM

## 2020-08-01 MED ORDER — METOPROLOL SUCCINATE ER 50 MG PO TB24: 50.0000 mg | ORAL_TABLET | Freq: Every day | ORAL | 3 refills | Status: DC

## 2020-08-01 NOTE — Patient Instructions (Signed)
Medication Instructions:  Your physician has recommended you make the following change in your medication:  DECREASE Toprol-XL to 50 mg tablets daily  *If you need a refill on your cardiac medications before your next appointment, please call your pharmacy*   Lab Work: None If you have labs (blood work) drawn today and your tests are completely normal, you will receive your results only by: [place] (if you have MyChart) OR A paper copy in the mail If you have any lab test that is abnormal or we need to change your treatment, we will call you to review the results.   Testing/Procedures: None   Follow-Up: At [facility], you and your health needs are our priority.  As part of our continuing mission to provide you with exceptional heart care, we have created designated Provider Care Teams.  These Care Teams include your primary Cardiologist (physician) and Advanced Practice Providers (APPs -  Physician Assistants and Nurse Practitioners) who all work together to provide you with the care you need, when you need it.  We recommend signing up for the patient portal called "MyChart".  Sign up information is provided on this After Visit Summary.  MyChart is used to connect with patients for Virtual Visits (Telemedicine).  Patients are able to view lab/test results, encounter notes, upcoming appointments, etc.  Non-urgent messages can be sent to your provider as well.   To learn more about what you can do with MyChart, go to [email or web address].    Your next appointment:   3 month(s)  The format for your next appointment:   In Person  Provider:   Bernerd Pho, PA-C Carlyle Dolly, MD  Other Instructions Eliquis 2.5 mg Sample: Lot #: [id number] Exp: 01/2021

## 2020-08-11 DIAGNOSIS — I480 Paroxysmal atrial fibrillation: Secondary | ICD-10-CM | POA: Diagnosis not present

## 2020-08-11 DIAGNOSIS — E7849 Other hyperlipidemia: Secondary | ICD-10-CM | POA: Diagnosis not present

## 2020-08-15 DIAGNOSIS — G894 Chronic pain syndrome: Secondary | ICD-10-CM | POA: Diagnosis not present

## 2020-08-27 ENCOUNTER — Other Ambulatory Visit (HOSPITAL_COMMUNITY): Payer: Self-pay

## 2020-08-27 MED ORDER — MULTAQ 400 MG PO TABS: 400.0000 mg | ORAL_TABLET | Freq: Two times a day (BID) | ORAL | 0 refills | Status: DC

## 2020-09-19 ENCOUNTER — Other Ambulatory Visit: Payer: Self-pay

## 2020-09-19 ENCOUNTER — Encounter (INDEPENDENT_AMBULATORY_CARE_PROVIDER_SITE_OTHER): Payer: Self-pay | Admitting: Gastroenterology

## 2020-09-19 ENCOUNTER — Ambulatory Visit (INDEPENDENT_AMBULATORY_CARE_PROVIDER_SITE_OTHER): Payer: Medicare Other | Admitting: Gastroenterology

## 2020-09-19 VITALS — BP 115/70 | HR 84 | Temp 98.8°F | Ht 62.0 in | Wt 106.2 lb

## 2020-09-19 DIAGNOSIS — R1013 Epigastric pain: Secondary | ICD-10-CM

## 2020-09-19 MED ORDER — SUCRALFATE 1 GM/10ML PO SUSP: 1.0000 g | Freq: Three times a day (TID) | ORAL | 0 refills | Status: DC

## 2020-09-19 MED ORDER — OMEPRAZOLE 40 MG PO CPDR: 40.0000 mg | DELAYED_RELEASE_CAPSULE | Freq: Two times a day (BID) | ORAL | 3 refills | Status: DC

## 2020-09-19 MED ORDER — FAMOTIDINE 20 MG PO TABS: 20.0000 mg | ORAL_TABLET | Freq: Every day | ORAL | 3 refills | Status: DC

## 2020-09-19 NOTE — Patient Instructions (Signed)
I have sent omeprazole '40mg'$  that you are going to take twice a day, 30 minutes before eating breakfast and 30 minutes before dinner. You will take pepcid once in the evenings before bed. You can use carafate up to 4 times a day, before meals and at bedtime.   If you decide you would like to proceed with the EGD for further evaluation of your symptoms, please let us know, as we cannot say for sure if this is due to reflux or an ulcer.  If you noticed any bloody or black stools, please let us know.  Follow up in 3 months.

## 2020-09-19 NOTE — Progress Notes (Signed)
Primary Care Physician:  Sharilyn Sites, MD Primary Gastroenterologist:  Dr. Laural Golden  Chief Complaint  Patient presents with   Gastroesophageal Reflux    [name] arrives with son [name]. [name] having burning in throat and states [name] has to eat every 2 -3 hours to help control reflux, has 1 -2 stools a day, some diarrhea since having colon cancer. Has tried omeprazole and currently taking aciphex.    HPI:   [name] is a 87 y.o. female with pmh of afib, COPD, colon cancer, HTN, and RA, presenting today at the request of Dr. Hilma Favors for ongoing epigastric pain.  Epigastric pain: x4 months, currently on aciphex '20mg'$  once a day by pcp, states that [name] has been on omeprazole once daily in the past as well without any relief. [name] is taking tums daily 4x due to epigastric pain which provides some relief. [name] reports that when [name] eats pain gets better.  [name] denies weight loss, early satiety, loss of appetite. [name] does not think [name] has had any melena or black stools, however [name] is legally blind so [name] cannot see her stools all that well. Son states he has not noticed any bloody or black stools. [name] states that [name] wakes up multiple times per night with epigastric pain, after [name] eats pain gets better, is relieved for 1-2 hours then it returns. States pain is burning, gnawing.   [name] denies any NSAID use. [name] states [name] takes tylenol some during the day.  [name] denies constipation, occasionally has some diarrhea once per week, this is baseline for her since her colon cancer.   Social hx: no tobacco or alcohol use  Colonoscopy:7/14/17examined portion of ileum normal, patent end to end ileocolonic anastomosis, characterized by healthy appearing mucosa, diverticulosis in sigmoid colon, exam otherwise normal, internal hemorrhoids, no specimens EGD:07/26/15-normal esophagus, z line regular 40 cm from incisors, normal stomach, normal duodenal bulb and second portion of duodenum, no specimens.  Past Medical  History:  Diagnosis Date   A-fib [facility])    Atrial fibrillation (HCC)    Cervicalgia    Chronic bronchitis with COPD (chronic obstructive pulmonary disease) (HCC)    Colon cancer (HCC)    Degeneration of cervical intervertebral disc    History of multiple strokes 2021   2 strokes within same day   Hypertension    Pinched nerve in shoulder    Rheumatoid arthritis(714.0)    RUQ pain    Vulvar dystrophy    Yeast infection     Past Surgical History:  Procedure Laterality Date   ABDOMINAL HYSTERECTOMY  09/13/1958   APPENDECTOMY  with hysterectomy   AV NODE ABLATION N/A 04/08/2020   Procedure: AV NODE ABLATION;  Surgeon: Evans Lance, MD;  Location: [facility];  Service: Cardiovascular;  Laterality: N/A;   BACK SURGERY  12/08/2010   plate/screws C3 C4   CARDIOVERSION N/A 09/29/2019   Procedure: CARDIOVERSION;  Surgeon: Arnoldo Lenis, MD;  Location: [facility];  Service: Endoscopy;  Laterality: N/A;  [name] ate early this AM per Dr. Harl Bowie   CARDIOVERSION N/A 02/26/2020   Procedure: CARDIOVERSION;  Surgeon: Sueanne Margarita, MD;  Location: [facility] ENDOSCOPY;  Service: Cardiovascular;  Laterality: N/A;   CHOLECYSTECTOMY  01/13/2000   COLONOSCOPY  04/02/2009   COLONOSCOPY  06/04/2005   COLONOSCOPY N/A 07/26/2015   Rehman:examined portion of ileum normal, patent end to end ileocolonic anastomosis, characterized by healthy appearing mucosa, diverticulosis in sigmoid colon, exam otherwise normal, internal hemorrhoids, no  specimens   COLONOSCOPY WITH ESOPHAGOGASTRODUODENOSCOPY (EGD) N/A 04/08/2012   Procedure: COLONOSCOPY WITH ESOPHAGOGASTRODUODENOSCOPY (EGD);  Surgeon: Rogene Houston, MD;  Location: [facility];  Service: Endoscopy;  Laterality: N/A;  130-moved to 245 Ann notified [name]   ESOPHAGOGASTRODUODENOSCOPY N/A 07/26/2015   Rehman: normal esophagus, z line regular 40 cm from incisors, normal stomach, normal duodenal bulb and second portion of duodenum, no specimens.    HEMICOLECTOMY  10/13/2003   PACEMAKER IMPLANT N/A 04/08/2020   Procedure: PACEMAKER IMPLANT;  Surgeon: Evans Lance, MD;  Location: [facility];  Service: Cardiovascular;  Laterality: N/A;    Current Outpatient Medications  Medication Sig Dispense Refill   apixaban (ELIQUIS) 2.5 MG TABS tablet Take 1 tablet (2.5 mg total) by mouth 2 (two) times daily. 60 tablet 0   Calcium Carbonate Antacid (TUMS PO) Take by mouth.     Cholecalciferol (VITAMIN D) 50 MCG (2000 UT) CAPS Take 2,000 Units by mouth daily.     HYDROcodone-acetaminophen (NORCO) 7.5-325 MG tablet Take 1 tablet by mouth 3 (three) times daily as needed for moderate pain. 15 tablet 0   HYDROcodone-acetaminophen (NORCO/VICODIN) 5-325 MG tablet Take 1 tablet by mouth 2 (two) times daily as needed.     leflunomide (ARAVA) 20 MG tablet Take 1 tablet (20 mg total) by mouth daily. 30 tablet 0   Magnesium Oxide 400 MG CAPS Take 1 capsule (400 mg total) by mouth daily. 90 capsule 3   metoprolol succinate (TOPROL-XL) 50 MG 24 hr tablet Take 1 tablet (50 mg total) by mouth daily. Take with or immediately following a meal. 90 tablet 3   Omega-3 Fatty Acids (FISH OIL) 1000 MG CAPS Take 1,000 mg by mouth daily.     RABEprazole (ACIPHEX) 20 MG tablet Take 20 mg by mouth daily.     rosuvastatin (CRESTOR) 20 MG tablet Take 1 tablet (20 mg total) by mouth daily at 6 PM. 30 tablet 0   torsemide (DEMADEX) 20 MG tablet Take 1 tablet (20 mg total) by mouth daily. 90 tablet 3   No current facility-administered medications for this visit.    Allergies as of 09/19/2020 - Review Complete 09/19/2020  Allergen Reaction Noted   Iodinated diagnostic agents Anaphylaxis 08/12/2010   Nerve block tray Anaphylaxis 09/30/2010   Nitrofuran derivatives Hives and Nausea And Vomiting 08/12/2010   Triamcinolone acetonide Anaphylaxis 08/13/2010   Vibra-tab [doxycycline] Nausea And Vomiting 01/02/2017    Family History  Problem Relation Age of Onset   Rheum  arthritis Mother    Parkinsonism Father    Hiatal hernia Sister    Healthy Brother    Healthy Daughter    Healthy Son    Healthy Son     Social History   Socioeconomic History   Marital status: Married    Spouse name: Not on file   Number of children: Not on file   Years of education: Not on file   Highest education level: Not on file  Occupational History   Not on file  Tobacco Use   Smoking status: Never   Smokeless tobacco: Never  Vaping Use   Vaping Use: Never used  Substance and Sexual Activity   Alcohol use: No   Drug use: No   Sexual activity: Never  Other Topics Concern   Not on file  Social History Narrative   Nonsmoker.  [name] lives alone.  Her son calls her every day to check on her.  Has a daughter involved in care as well.  Social Determinants of Health   Financial Resource Strain: Not on file  Food Insecurity: Not on file  Transportation Needs: Not on file  Physical Activity: Not on file  Stress: Not on file  Social Connections: Not on file  Intimate Partner Violence: Not on file    Review of Systems: Gen: Denies any fever, chills, fatigue, weight loss, lack of appetite.  CV: Denies chest pain, heart palpitations, peripheral edema, syncope.  Resp: Denies shortness of breath at rest or with exertion. Denies wheezing or cough.  GI: Denies dysphagia or odynophagia. Denies jaundice, hematemesis, fecal incontinence. +gnawing/burning epigastric pain GU : Denies urinary burning, urinary frequency, urinary hesitancy MS: Denies joint pain, muscle weakness, cramps, or limitation of movement.  Derm: Denies rash, itching, dry skin Psych: Denies depression, anxiety, memory loss, and confusion Heme: Denies bruising, bleeding, and enlarged lymph nodes.  Physical Exam: BP 115/70 (BP Location: Right Arm, Patient Position: Sitting, Cuff Size: Normal)   Pulse 84   Temp 98.8 F (37.1 C) (Oral)   Ht '5\' 2"'$  (1.575 m)   Wt 106 lb 3.2 oz (48.2 kg)   BMI 19.42 kg/m   General:   Alert and oriented. Pleasant and cooperative. Well-nourished and well-developed.  Head:  Normocephalic and atraumatic. Eyes:  Without icterus, sclera clear and conjunctiva pink.  Ears:  Normal auditory acuity. Mouth:  No deformity or lesions, oral mucosa pink.  Lungs: wheezing diffusely Heart:  S1, S2 present without murmurs appreciated.  Abdomen:  +BS, soft, non-tender and non-distended. No HSM noted. No guarding or rebound. No masses appreciated.  Rectal:  Deferred  Msk:  Symmetrical without gross deformities. Normal posture. Extremities:  Without edema. Neurologic:  Alert and  oriented x4;  grossly normal neurologically. Skin:  Intact without significant lesions or rashes. Psych:  Alert and cooperative. Normal mood and affect.  ASSESSMENT: HEIDEMARIE NATHANSON is a 87 y.o. female presenting today with ongoing gnawing/burning epigastric pain x4 months. Sent by PCP as [name] has tried multiple PPIs without any resolve.  [name] states that pain in epigastric area is ongoing, improved after eating for about 1-2 hours then returns. Taking tums 4x/day with some relief. [name] is currently on aciphex '20mg'$  daily and has been on omeprazole '40mg'$  once daily prior to that without any change. [name] denies any melena or hematochezia, denies early satiety or weight loss. Denies acid regurgitation, nausea or vomiting.   I suspect a peptic ulcer given her symptoms, however, I discussed with patient that we would need to proceed with an EGD to definitively diagnose this. After discussion with patient and her son, [name] does not feel comfortable proceeding with EGD at this time given her age and medical history. [name] is not having any red flag symptoms. I think at this point we can try to aggressively treat as we would with PUD.   Initially I had planned to start dexilant, however, medication was going to be $100/month which patient stated [name] could not afford, we will try omeprazole '40mg'$  BID, Pepcid '20mg'$  nightly  and carafate 1 g QID. I advised patient that if [name] develops any redflag symptoms such as weight loss, blood in her stools, black stools, early satiety, hematemesis, [name] needs to let us know. [name] should also avoid greasy, spicy, foods, and NSAIDs as [name] has been doing. [name] will call us in a few weeks if her symptoms have not improved some.    PLAN: Omeprazole '40mg'$  BID Pepcid '20mg'$  nightly Carafate 4x/day Reconsider EGD in future if red flag  symptoms occur Try to avoid tums  Follow up 3 months  Erin Uecker L. Alver Sorrow, MSN, APRN, AGNP-C Adult-Gerontology Nurse Practitioner [facility]

## 2020-09-25 DIAGNOSIS — Z681 Body mass index (BMI) 19 or less, adult: Secondary | ICD-10-CM | POA: Diagnosis not present

## 2020-09-25 DIAGNOSIS — G894 Chronic pain syndrome: Secondary | ICD-10-CM | POA: Diagnosis not present

## 2020-09-25 DIAGNOSIS — M2012 Hallux valgus (acquired), left foot: Secondary | ICD-10-CM | POA: Diagnosis not present

## 2020-10-07 ENCOUNTER — Ambulatory Visit (INDEPENDENT_AMBULATORY_CARE_PROVIDER_SITE_OTHER): Payer: Medicare Other

## 2020-10-07 DIAGNOSIS — I442 Atrioventricular block, complete: Secondary | ICD-10-CM | POA: Diagnosis not present

## 2020-10-07 LAB — CUP PACEART REMOTE DEVICE CHECK
Battery Remaining Longevity: 152 mo
Battery Voltage: 3.17 V
Brady Statistic RV Percent Paced: 99.95 %
Date Time Interrogation Session: 20220926094211
Implantable Lead Implant Date: 20220328
Implantable Lead Location: 753860
Implantable Lead Model: 3830
Implantable Pulse Generator Implant Date: 20220328
Lead Channel Impedance Value: 304 Ohm
Lead Channel Impedance Value: 456 Ohm
Lead Channel Pacing Threshold Amplitude: 0.875 V
Lead Channel Pacing Threshold Pulse Width: 0.4 ms
Lead Channel Sensing Intrinsic Amplitude: 11.125 mV
Lead Channel Setting Pacing Amplitude: 2 V
Lead Channel Setting Pacing Pulse Width: 0.4 ms
Lead Channel Setting Sensing Sensitivity: 2 mV

## 2020-10-11 IMAGING — CT CT HEAD W/O CM
3 series · 15 of 47 positions shown, 18 images · non-contrast
Comparison: September 28, 2019

CLINICAL DATA: Dizziness and nausea

EXAM:
CT HEAD WITHOUT CONTRAST
TECHNIQUE: Contiguous axial images were obtained from the base of the skull
through the vertex without intravenous contrast.

[Series 3: head w o · axial · 0.42mm/px · z∈[-21,+119]mm · 9 of 34 slices shown, 12 images]
[im 3/34  brain]
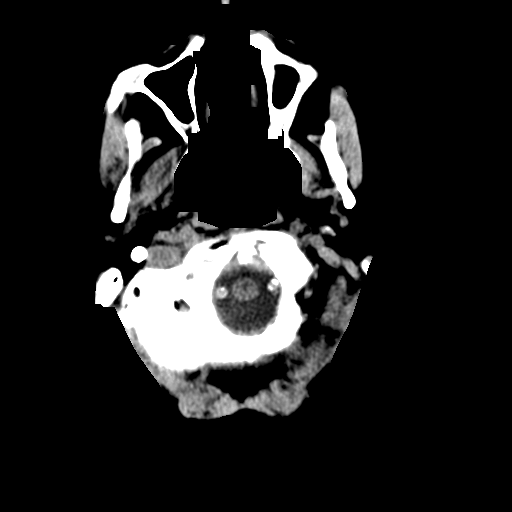
[im 3/34  bone]
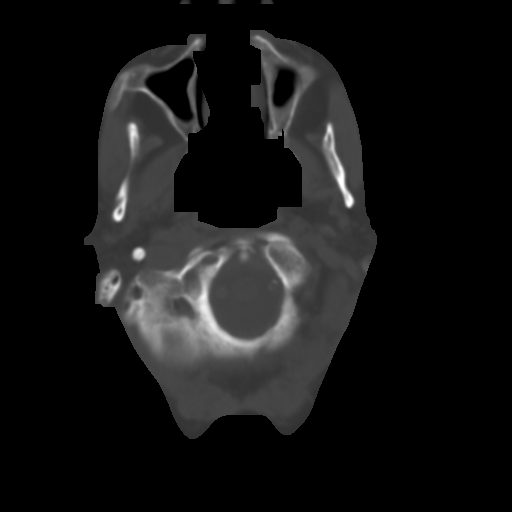
[im 6/34  brain]
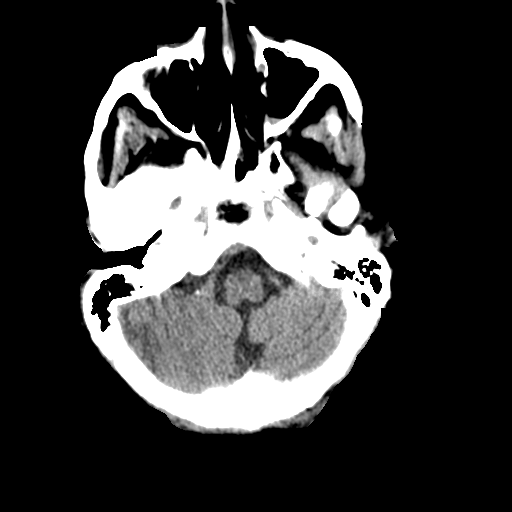
[im 10/34  brain]
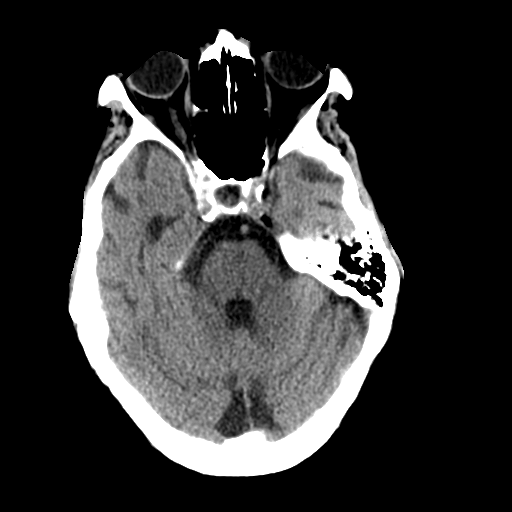
[im 13/34  brain]
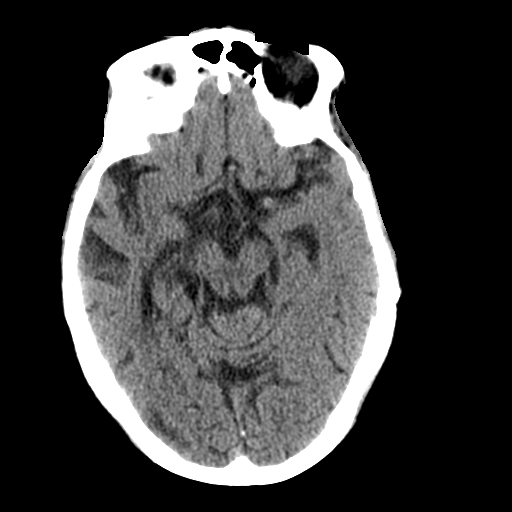
[im 18/34  brain]
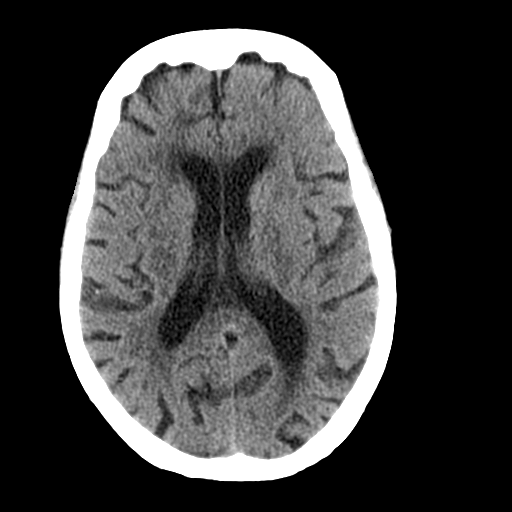
[im 18/34  bone]
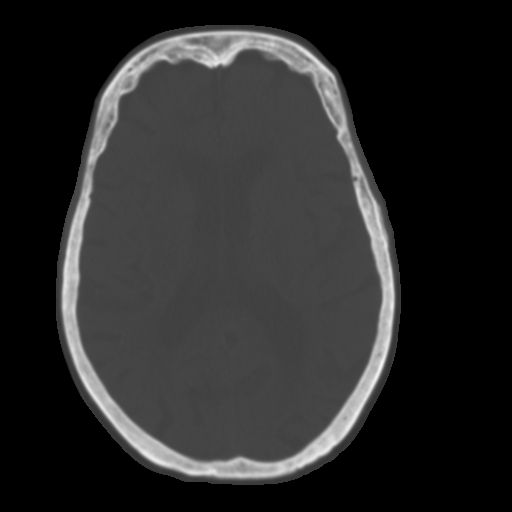
[im 21/34  brain]
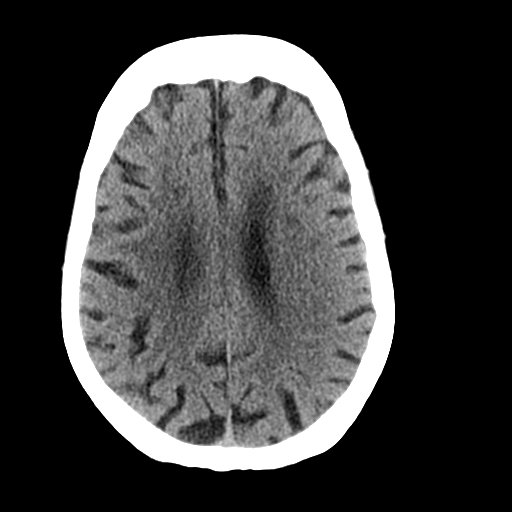
[im 24/34  brain]
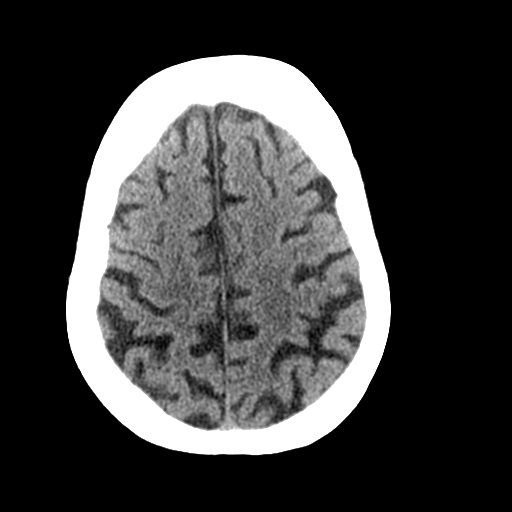
[im 28/34  brain]
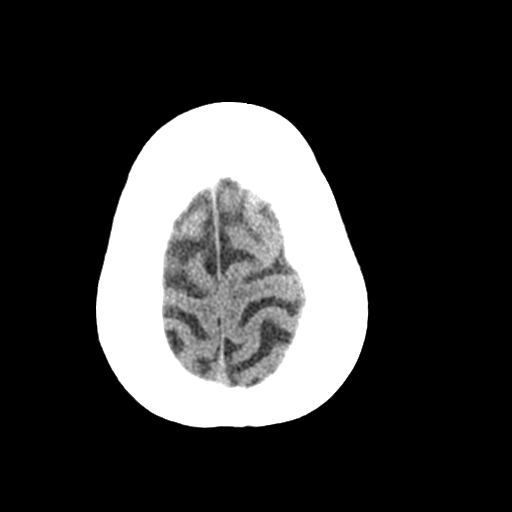
[im 31/34  brain]
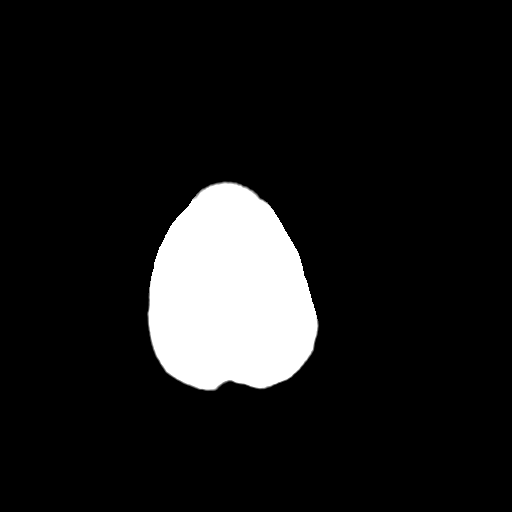
[im 31/34  bone]
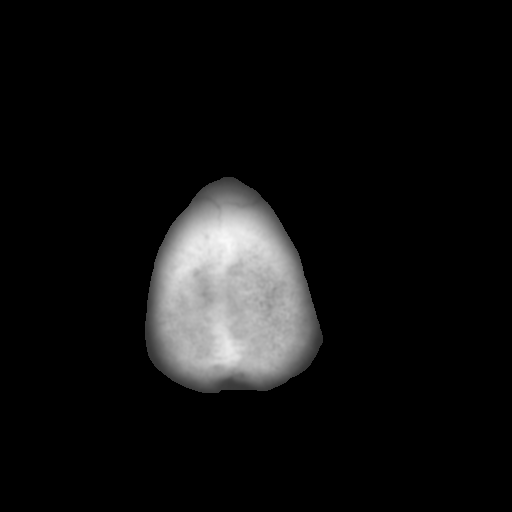

[Series 4: coronal soft · coronal · 0.32mm/px · 3 of 71 slices shown]
[im 24/71  brain]
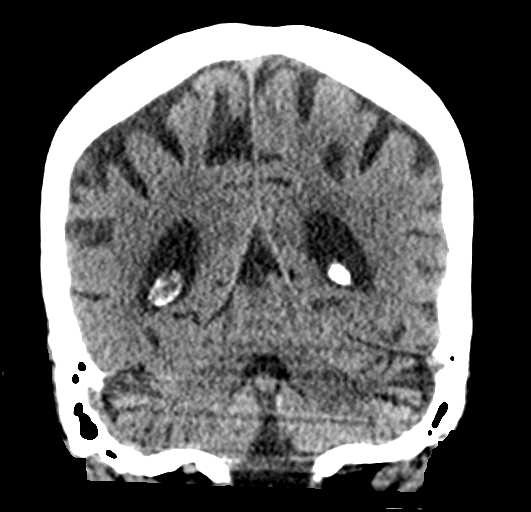
[im 32/71  brain]
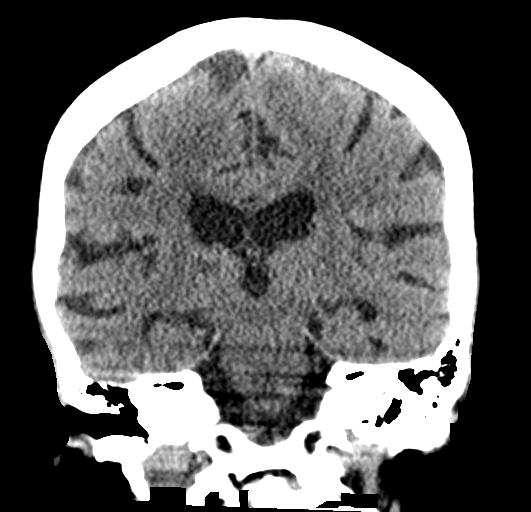
[im 39/71  brain]
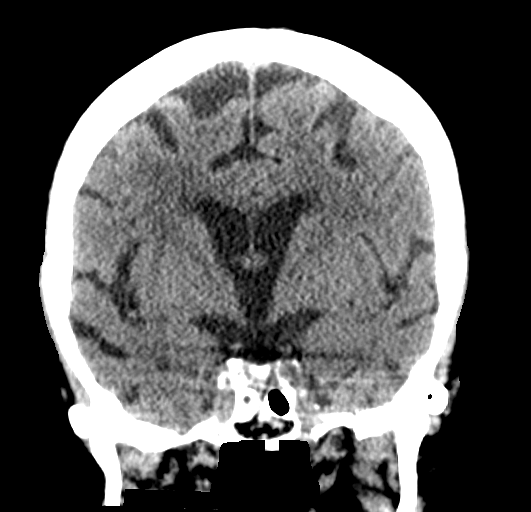

[Series 5: sagittal soft · sagittal · 0.33mm/px · 3 of 55 slices shown]
[im 19/55  brain]
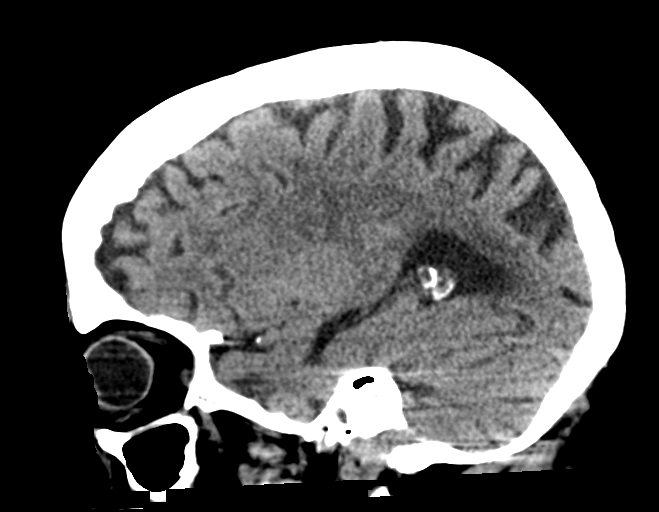
[im 28/55  brain]
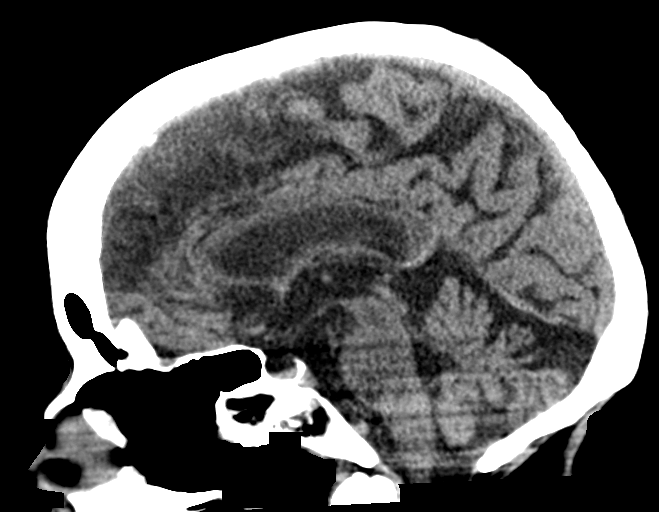
[im 37/55  brain]
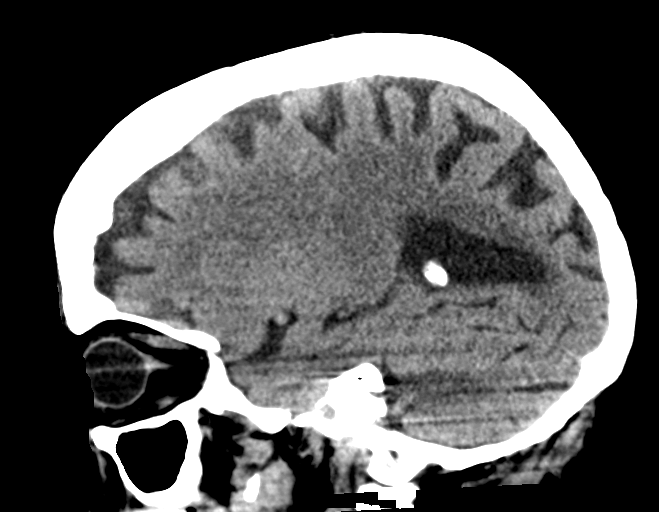

[15 of 47 positions shown; findings below may reference images not displayed]

FINDINGS: Brain: Mild diffuse atrophy is stable. No intracranial mass,
hemorrhage, extra-axial fluid collection, or midline shift. There is
patchy small vessel disease in the centra semiovale bilaterally.
Small lacunar type infarct noted in the right thalamus, stable.
Small vessel disease noted in the anterior limb of the right
internal capsule, stable. No acute appearing infarct evident.

Vascular: No hyperdense vessel. There is calcification in each
carotid siphon region and distal vertebral arteries.

Skull: The bony calvarium appears intact.

Sinuses/Orbits: Mucosal thickening noted in the left maxillary
antrum. Opacification noted in the superior left maxillary antrum as
well as in several ethmoid air cells. There is a benign osteoma in
the left ethmoid region measuring 4 x 4 mm. Orbits appear symmetric
bilaterally.

Other: Mastoid air cells are clear.
IMPRESSION: Stable atrophy with supratentorial small vessel disease. No acute
infarct. No mass or hemorrhage.

Foci of arterial vascular calcification noted. There are foci of
paranasal sinus disease at several sites.

## 2020-10-11 NOTE — Progress Notes (Signed)
Remote pacemaker transmission.   

## 2020-10-17 ENCOUNTER — Ambulatory Visit (INDEPENDENT_AMBULATORY_CARE_PROVIDER_SITE_OTHER): Payer: Medicare Other | Admitting: Gastroenterology

## 2020-10-28 DIAGNOSIS — M069 Rheumatoid arthritis, unspecified: Secondary | ICD-10-CM | POA: Diagnosis not present

## 2020-10-28 DIAGNOSIS — G894 Chronic pain syndrome: Secondary | ICD-10-CM | POA: Diagnosis not present

## 2020-10-28 DIAGNOSIS — I10 Essential (primary) hypertension: Secondary | ICD-10-CM | POA: Diagnosis not present

## 2020-10-31 ENCOUNTER — Encounter (INDEPENDENT_AMBULATORY_CARE_PROVIDER_SITE_OTHER): Payer: Self-pay | Admitting: Gastroenterology

## 2020-10-31 ENCOUNTER — Ambulatory Visit (INDEPENDENT_AMBULATORY_CARE_PROVIDER_SITE_OTHER): Payer: Medicare Other | Admitting: Gastroenterology

## 2020-10-31 ENCOUNTER — Other Ambulatory Visit: Payer: Self-pay

## 2020-10-31 VITALS — BP 130/66 | HR 86 | Temp 98.2°F | Ht 62.0 in | Wt 102.6 lb

## 2020-10-31 DIAGNOSIS — Z79891 Long term (current) use of opiate analgesic: Secondary | ICD-10-CM | POA: Diagnosis not present

## 2020-10-31 DIAGNOSIS — G8929 Other chronic pain: Secondary | ICD-10-CM | POA: Diagnosis not present

## 2020-10-31 DIAGNOSIS — R1013 Epigastric pain: Secondary | ICD-10-CM | POA: Diagnosis not present

## 2020-10-31 MED ORDER — HYOSCYAMINE SULFATE 0.125 MG PO TABS: 0.1250 mg | ORAL_TABLET | Freq: Three times a day (TID) | ORAL | 2 refills | Status: DC | PRN

## 2020-10-31 NOTE — Patient Instructions (Signed)
Stop sucralfate and famotidine Decrease omeprazole to 40 mg every day Schedule MR angio abdomen/pelvis with IV contrast Start Levsin 1 tablet q8h as needed for abdominal pain

## 2020-10-31 NOTE — Progress Notes (Signed)
Maylon Peppers, M.D. Gastroenterology & Hepatology [facility] [address] [place], [place] [address]  Primary Care Physician: Sharilyn Sites, MD [address] [place] [address]  I will communicate my assessment and recommendations to the referring MD via EMR.  Problems: Chronic epigastric abdominal pain  History of Present Illness: [name] is a 87 y.o.  female with pmh of afib status post multiple DCCV's and failed Multaq as well, s/p AV node ablation with PPM placement in 03/2020, ILD, COPD, colon cancer, HTN, stroke x2 and RA, who presents for follow up of chronic epigastric abdominal pain.  The patient was last seen on 09/19/2020. At that time, the patient was advised to increase her omeprazole to 40 mg twice a day and add Pepcid 20 mg at night, she was also given a prescription for Carafate 4 times every day.  She was advised to proceed with an EGD to explore her symptoms further but she was not interested in pursuing this given her age and multiple comorbidities.  The patient has presented a history of abdominal pain in the epigastric area for 5 months which was initially treated with omeprazole every day without improvement and was subsequently switched to Aciphex 20 mg every day.  Finally, she was seen in our office was put on the regimen described above.  Patient is a poor historian as she does not remember the timeline of her symptoms. The patient reports that even though she has tried multiple medication to improve her symptoms.  Patient comes to the office with her son.  She states she has not presented any improvement of her abdominal pain with the medication. She reports tha she has to wake up multiple times at night as she is presenting significant pain "described as hunger pain" in her upper abdomen every 2 hours. After eating, her pain improves her symptoms significantly but only lasts a few hours. The  patient denies having any nausea, vomiting, fever, chills, hematochezia, melena, hematemesis, abdominal distention, diarrhea, jaundice, pruritus.  She reports that she has lost 100 lb in the last 15 years as she has felt repulsion with food.  Notably, she has been taking opiates chronically for her RA for multiple years.  Notably, the patient reports that she has felt sleepy multiple times during the day as she is not able to have a well rested sleep.  Most recent abdominal imaging was an abdominal ultrasound performed on 02/22/2017 which showed findings consistent with previous cholecystectomy and mild hydronephrosis but no other alterations.  Colonoscopy:7/14/17examined portion of ileum normal, patent end to end ileocolonic anastomosis, characterized by healthy appearing mucosa, diverticulosis in sigmoid colon, exam otherwise normal, internal hemorrhoids, no specimens EGD:07/26/15-normal esophagus, z line regular 40 cm from incisors, normal stomach, normal duodenal bulb and second portion of duodenum, no specimens  Past Medical History: Past Medical History:  Diagnosis Date   A-fib ([place])    Atrial fibrillation ([facility])    Cervicalgia    Chronic bronchitis with COPD (chronic obstructive pulmonary disease) ([place])    Colon cancer ([place])    Degeneration of cervical intervertebral disc    History of multiple strokes 2021   2 strokes within same day   Hypertension    Pinched nerve in shoulder    Rheumatoid arthritis(714.0)    RUQ pain    Vulvar dystrophy    Yeast infection     Past Surgical History: Past Surgical History:  Procedure Laterality Date   ABDOMINAL HYSTERECTOMY  09/13/1958  APPENDECTOMY  with hysterectomy   AV NODE ABLATION N/A 04/08/2020   Procedure: AV NODE ABLATION;  Surgeon: Evans Lance, MD;  Location: [place] [facility];  Service: Cardiovascular;  Laterality: N/A;   BACK SURGERY  12/08/2010   plate/screws C3 C4   CARDIOVERSION N/A 09/29/2019   Procedure:  CARDIOVERSION;  Surgeon: Arnoldo Lenis, MD;  [facility];  Service: Endoscopy;  Laterality: N/A;  pt ate early this AM per Dr. Harl Bowie   CARDIOVERSION N/A 02/26/2020   Procedure: CARDIOVERSION;  Surgeon: Sueanne Margarita, MD;  Location: [facility] ENDOSCOPY;  Service: Cardiovascular;  Laterality: N/A;   CHOLECYSTECTOMY  01/13/2000   COLONOSCOPY  04/02/2009   COLONOSCOPY  06/04/2005   COLONOSCOPY N/A 07/26/2015   Rehman:examined portion of ileum normal, patent end to end ileocolonic anastomosis, characterized by healthy appearing mucosa, diverticulosis in sigmoid colon, exam otherwise normal, internal hemorrhoids, no specimens   COLONOSCOPY WITH ESOPHAGOGASTRODUODENOSCOPY (EGD) N/A 04/08/2012   Procedure: COLONOSCOPY WITH ESOPHAGOGASTRODUODENOSCOPY (EGD);  Surgeon: Rogene Houston, MD;  Location: [facility];  Service: Endoscopy;  Laterality: N/A;  130-moved to 245 Ann notified pt   ESOPHAGOGASTRODUODENOSCOPY N/A 07/26/2015   Rehman: normal esophagus, z line regular 40 cm from incisors, normal stomach, normal duodenal bulb and second portion of duodenum, no specimens.   HEMICOLECTOMY  10/13/2003   PACEMAKER IMPLANT N/A 04/08/2020   Procedure: PACEMAKER IMPLANT;  Surgeon: Evans Lance, MD;  Location: [place] [facility];  Service: Cardiovascular;  Laterality: N/A;    Family History: Family History  Problem Relation Age of Onset   Rheum arthritis Mother    Parkinsonism Father    Hiatal hernia Sister    Healthy Brother    Healthy Daughter    Healthy Son    Healthy Son     Social History: Social History   Tobacco Use  Smoking Status Never  Smokeless Tobacco Never   Social History   Substance and Sexual Activity  Alcohol Use No   Social History   Substance and Sexual Activity  Drug Use No    Allergies: Allergies  Allergen Reactions   Iodinated Diagnostic Agents Anaphylaxis    08-12-10 Pt had severe reaction with itching/hives/loss of consciousness within 5  minutes of CEPI today.  She received both Kenalog and Iondinated Contrast, so not certain which agent caused reaction.  If patient returns for another CEPI, Dr. Jobe Igo considers using Decadron and a 13-hour prep.  jkl   Nerve Block Tray Anaphylaxis    Patient states that she was sent to have Epidural for her shoulder. After rec'ving she remembers saying that she was itching all over  And that's it. She was transported to hospital  By EMS. Stayed overnight.   Nitrofuran Derivatives Hives and Nausea And Vomiting   Triamcinolone Acetonide Anaphylaxis    08-12-10  Pt had severe reaction with itching/hives/loss of consciousness within five minutes of CEPI today.  She received both Kenalog and Iodinated Contrast, so not certain which agent caused reaction.  If pt returns for another cervical epidural steroid injection, Dr. Jobe Igo suggests using Decadron and a 13-hr prep.  JKL PATIENT USES PROCTOCREAM 2.5% WITHOUT DIFFICULTY FOR VULVAR DYSTROPHY Patient says she can take prednisone without any problem   Vibra-Tab [Doxycycline] Nausea And Vomiting    Medications: Current Outpatient Medications  Medication Sig Dispense Refill   apixaban (ELIQUIS) 2.5 MG TABS tablet Take 1 tablet (2.5 mg total) by mouth 2 (two) times daily. 60 tablet 0   Calcium Carbonate  Antacid (TUMS PO) Take by mouth.     Cholecalciferol (VITAMIN D) 50 MCG (2000 UT) CAPS Take 2,000 Units by mouth daily.     famotidine (PEPCID) 20 MG tablet Take 1 tablet (20 mg total) by mouth at bedtime. 90 tablet 3   HYDROcodone-acetaminophen (NORCO) 7.5-325 MG tablet Take 1 tablet by mouth 3 (three) times daily as needed for moderate pain. 15 tablet 0   HYDROcodone-acetaminophen (NORCO/VICODIN) 5-325 MG tablet Take 1 tablet by mouth 2 (two) times daily as needed.     leflunomide (ARAVA) 20 MG tablet Take 1 tablet (20 mg total) by mouth daily. 30 tablet 0   Magnesium Oxide 400 MG CAPS Take 1 capsule (400 mg total) by mouth daily. 90 capsule 3    metoprolol succinate (TOPROL-XL) 50 MG 24 hr tablet Take 1 tablet (50 mg total) by mouth daily. Take with or immediately following a meal. 90 tablet 3   Omega-3 Fatty Acids (FISH OIL) 1000 MG CAPS Take 1,000 mg by mouth daily.     omeprazole (PRILOSEC) 40 MG capsule Take 1 capsule (40 mg total) by mouth 2 (two) times daily. 90 capsule 3   rosuvastatin (CRESTOR) 20 MG tablet Take 1 tablet (20 mg total) by mouth daily at 6 PM. 30 tablet 0   sucralfate (CARAFATE) 1 GM/10ML suspension Take 10 mLs (1 g total) by mouth 4 (four) times daily -  with meals and at bedtime. 1200 mL 0   torsemide (DEMADEX) 20 MG tablet Take 1 tablet (20 mg total) by mouth daily. 90 tablet 3   No current facility-administered medications for this visit.    Review of Systems: GENERAL: negative for malaise, night sweats HEENT: No changes in hearing or vision, no nose bleeds or other nasal problems. NECK: Negative for lumps, goiter, pain and significant neck swelling RESPIRATORY: Negative for cough, wheezing CARDIOVASCULAR: Negative for chest pain, leg swelling, palpitations, orthopnea GI: SEE HPI MUSCULOSKELETAL: Negative for joint pain or swelling, back pain, and muscle pain. SKIN: Negative for lesions, rash PSYCH: Negative for sleep disturbance, mood disorder and recent psychosocial stressors. HEMATOLOGY Negative for prolonged bleeding, bruising easily, and swollen nodes. ENDOCRINE: Negative for cold or heat intolerance, polyuria, polydipsia and goiter. NEURO: negative for tremor, gait imbalance, syncope and seizures. The remainder of the review of systems is noncontributory.   Physical Exam: BP 130/66 (BP Location: Left Arm, Patient Position: Sitting, Cuff Size: Small)   Pulse 86   Temp 98.2 F (36.8 C) (Oral)   Ht 5\' 2"  (1.575 m)   Wt 102 lb 9.6 oz (46.5 kg)   BMI 18.77 kg/m  GENERAL: The patient is AO x3, in no acute distress. Looks frail and fatigued. Falls asleep multiple times during the visit  encounter. HEENT: Head is normocephalic and atraumatic. EOMI are intact. Mouth is well hydrated and without lesions. NECK: Supple. No masses LUNGS: Clear to auscultation. No presence of rhonchi/wheezing/rales. Adequate chest expansion HEART: RRR, normal s1 and s2. ABDOMEN: Soft, nontender, no guarding, no peritoneal signs, and nondistended. BS +. No masses. EXTREMITIES: Without any cyanosis, clubbing, rash, lesions or edema. NEUROLOGIC: AOx3, no focal motor deficit. SKIN: no jaundice, no rashes  Imaging/Labs: as above  I personally reviewed and interpreted the available labs, imaging and endoscopic files.  Impression and Plan: [name] is a 87 y.o.  female with pmh of afib status post multiple DCCV's and failed Multaq as well, s/p AV node ablation with PPM placement in 03/2020, ILD, COPD, colon cancer, HTN, stroke x2  and RA, who presents for follow up of chronic epigastric abdominal pain.  The patient has presented persistent symptoms abdominal pain that gets transiently better after having food intake without any other associated symptoms.  She has presented significant weight loss for the last few years but she was not present abdominal pain at that time.  She did not have any improvement of her symptoms on the most recent GI cocktail regimen which include a PPI, anti-H2 and Carafate.  I had a thorough discussion with the patient and her son regarding the next steps in her care which include performing cross-sectional abdominal imaging versus an endoscopic investigation.  She would like to proceed with the least invasive testing, from which the cross-sectional abdominal imaging will be the best option.  She had a history of anaphylaxis to iodine components, which contraindicates the use of IV contrast for a CT scan.  She has a pacemaker that was placed this year and based on the model it is MRI compatible.  Due to this, we will proceed with an MR angio of the abdomen and pelvis with IV  contrast to evaluate her abdominal pain.  Based on findings, will need to proceed with an EGD if unremarkable.  The patient and the son understood and agreed with this plan.  I sent the prescription for Bentyl to improve her symptoms.  I wonder whether part of her pain is related to chronic opiate use and bowel hypersensitivity but given her age and comorbidities it would be important to rule out other organic conditions.  As she did not have any improvement with a GI cocktail, we will change her regimen to omeprazole 40 mg every day only.  -Stop sucralfate and famotidine -Decrease omeprazole to 40 mg every day -Schedule MR angio abdomen/pelvis with IV contrast -Start Levsin 1 tablet q8h as needed for abdominal pain  All questions were answered.      Harvel Quale, MD Gastroenterology and Hepatology [facility]

## 2020-11-01 ENCOUNTER — Ambulatory Visit: Payer: Medicare Other | Admitting: Student

## 2020-11-01 ENCOUNTER — Other Ambulatory Visit (HOSPITAL_COMMUNITY)
Admission: RE | Admit: 2020-11-01 | Discharge: 2020-11-01 | Disposition: A | Discharge status: Home / Self Care | Payer: Medicare Other | Source: Ambulatory Visit | Attending: Student | Admitting: Student

## 2020-11-01 ENCOUNTER — Encounter: Payer: Self-pay | Admitting: Student

## 2020-11-01 VITALS — BP 116/66 | HR 80 | Ht 62.0 in | Wt 100.2 lb

## 2020-11-01 DIAGNOSIS — R627 Adult failure to thrive: Secondary | ICD-10-CM | POA: Diagnosis not present

## 2020-11-01 DIAGNOSIS — Z8673 Personal history of transient ischemic attack (TIA), and cerebral infarction without residual deficits: Secondary | ICD-10-CM

## 2020-11-01 DIAGNOSIS — Z79899 Other long term (current) drug therapy: Secondary | ICD-10-CM

## 2020-11-01 DIAGNOSIS — I480 Paroxysmal atrial fibrillation: Secondary | ICD-10-CM | POA: Diagnosis not present

## 2020-11-01 DIAGNOSIS — R600 Localized edema: Secondary | ICD-10-CM | POA: Diagnosis not present

## 2020-11-01 LAB — COMPREHENSIVE METABOLIC PANEL
ALT: 21 U/L (ref 0–44)
AST: 44 U/L — ABNORMAL HIGH (ref 15–41)
Albumin: 3.4 g/dL — ABNORMAL LOW (ref 3.5–5.0)
Alkaline Phosphatase: 53 U/L (ref 38–126)
Anion gap: 10 (ref 5–15)
BUN: 21 mg/dL (ref 8–23)
CO2: 28 mmol/L (ref 22–32)
Calcium: 7.3 mg/dL — ABNORMAL LOW (ref 8.9–10.3)
Chloride: 100 mmol/L (ref 98–111)
Creatinine, Ser: 0.76 mg/dL (ref 0.44–1.00)
GFR, Estimated: >60 mL/min (ref >60)
Glucose, Bld: 101 mg/dL — ABNORMAL HIGH (ref 70–99)
Potassium: 3.8 mmol/L (ref 3.5–5.1)
Sodium: 138 mmol/L (ref 135–145)
Total Bilirubin: 0.6 mg/dL (ref 0.3–1.2)
Total Protein: 6.4 g/dL — ABNORMAL LOW (ref 6.5–8.1)

## 2020-11-01 LAB — CBC
HCT: 35.3 % — ABNORMAL LOW (ref 36.0–46.0)
Hemoglobin: 11.1 g/dL — ABNORMAL LOW (ref 12.0–15.0)
MCH: 31.2 pg (ref 26.0–34.0)
MCHC: 31.4 g/dL (ref 30.0–36.0)
MCV: 99.2 fL (ref 80.0–100.0)
Platelets: 148 10*3/uL — ABNORMAL LOW (ref 150–400)
RBC: 3.56 MIL/uL — ABNORMAL LOW (ref 3.87–5.11)
RDW: 15.1 % (ref 11.5–15.5)
WBC: 4.1 10*3/uL (ref 4.0–10.5)
nRBC: 0 % (ref 0.0–0.2)

## 2020-11-01 LAB — GLUCOSE, POCT (MANUAL RESULT ENTRY): POC Glucose: 110 mg/dl — AB (ref 70–99)

## 2020-11-01 MED ORDER — METOPROLOL SUCCINATE ER 25 MG PO TB24: 25.0000 mg | ORAL_TABLET | Freq: Every day | ORAL | 3 refills | Status: DC

## 2020-11-01 NOTE — Progress Notes (Signed)
Cardiology Office Note    Date:  11/02/2020   ID:  [name], DOB 1933-05-21, MRN [id number]  PCP:  Sharilyn Sites, MD  Cardiologist: Carlyle Dolly, MD   EP: Dr. Lovena Le  Chief Complaint  Patient presents with   Follow-up    3 month visit    History of Present Illness:    [name] is a 87 y.o. female with past medical history of paroxysmal atrial fibrillation (previously failed multiple DCCV's on antiarrhythmic therapy and underwent AV node ablation and PPM placement in 03/2020), interstitial lung disease, RA and prior CVA who presents to the office today for 51-month follow-up.  She was examined by myself in 07/2019 and reported her back had been sore since experiencing a fall from her wheelchair but denied any recent chest pain or palpitations. She did report worsening fatigue and she questioned if this was possibly due to Toprol-XL, therefore dosing was reduced from 100 mg daily to 50 mg daily to see if this would help with her symptoms and she was continued on Eliquis 2.5 mg twice daily for anticoagulation.  In talking with the patient and her son today, she reports still having intermittent abdominal pain which is actually relieved with food consumption. She does not like to eat much food though as she has a very decreased appetite. She has been evaluated by GI with an Abdominal MRI pending. She is mostly wheelchair-bound but does stand for transfers. She has baseline dyspnea and fatigue but denies any recent chest pain or palpitations. No recent orthopnea, PND or pitting edema.   Past Medical History:  Diagnosis Date   A-fib [facility])    Atrial fibrillation (HCC)    Cervicalgia    Chronic bronchitis with COPD (chronic obstructive pulmonary disease) (HCC)    Colon cancer (HCC)    Degeneration of cervical intervertebral disc    History of multiple strokes 2021   2 strokes within same day   Hypertension    Pinched nerve in shoulder    Rheumatoid arthritis(714.0)     RUQ pain    Vulvar dystrophy    Yeast infection     Past Surgical History:  Procedure Laterality Date   ABDOMINAL HYSTERECTOMY  09/13/1958   APPENDECTOMY  with hysterectomy   AV NODE ABLATION N/A 04/08/2020   Procedure: AV NODE ABLATION;  Surgeon: Evans Lance, MD;  Location: [place] [facility];  Service: Cardiovascular;  Laterality: N/A;   BACK SURGERY  12/08/2010   plate/screws C3 C4   CARDIOVERSION N/A 09/29/2019   Procedure: CARDIOVERSION;  Surgeon: Arnoldo Lenis, MD;  [facility]: [facility];  Service: Endoscopy;  Laterality: N/A;  pt ate early this AM per Dr. Harl Bowie   CARDIOVERSION N/A 02/26/2020   Procedure: CARDIOVERSION;  Surgeon: Sueanne Margarita, MD;  Location: [facility] ENDOSCOPY;  Service: Cardiovascular;  Laterality: N/A;   CHOLECYSTECTOMY  01/13/2000   COLONOSCOPY  04/02/2009   COLONOSCOPY  06/04/2005   COLONOSCOPY N/A 07/26/2015   Rehman:examined portion of ileum normal, patent end to end ileocolonic anastomosis, characterized by healthy appearing mucosa, diverticulosis in sigmoid colon, exam otherwise normal, internal hemorrhoids, no specimens   COLONOSCOPY WITH ESOPHAGOGASTRODUODENOSCOPY (EGD) N/A 04/08/2012   Procedure: COLONOSCOPY WITH ESOPHAGOGASTRODUODENOSCOPY (EGD);  Surgeon: Rogene Houston, MD;  Location: [facility];  Service: Endoscopy;  Laterality: N/A;  130-moved to 245 Ann notified pt   ESOPHAGOGASTRODUODENOSCOPY N/A 07/26/2015   Rehman: normal esophagus, z line regular 40 cm from incisors, normal stomach, normal duodenal bulb  and second portion of duodenum, no specimens.   HEMICOLECTOMY  10/13/2003   PACEMAKER IMPLANT N/A 04/08/2020   Procedure: PACEMAKER IMPLANT;  Surgeon: Evans Lance, MD;  Location: [place] [facility];  Service: Cardiovascular;  Laterality: N/A;    Current Medications: Outpatient Medications Prior to Visit  Medication Sig Dispense Refill   apixaban (ELIQUIS) 2.5 MG TABS tablet Take 1 tablet (2.5 mg total) by mouth 2 (two)  times daily. 60 tablet 0   Calcium Carbonate Antacid (TUMS PO) Take by mouth.     HYDROcodone-acetaminophen (NORCO) 7.5-325 MG tablet Take 1 tablet by mouth 3 (three) times daily as needed for moderate pain. 15 tablet 0   hyoscyamine (LEVSIN) 0.125 MG tablet Take 1 tablet (0.125 mg total) by mouth every 8 (eight) hours as needed (abdominal pain). 30 tablet 2   leflunomide (ARAVA) 20 MG tablet Take 1 tablet (20 mg total) by mouth daily. 30 tablet 0   Magnesium Oxide 400 MG CAPS Take 1 capsule (400 mg total) by mouth daily. 90 capsule 3   Omega-3 Fatty Acids (FISH OIL) 1000 MG CAPS Take 1,000 mg by mouth daily.     omeprazole (PRILOSEC) 40 MG capsule Take 1 capsule (40 mg total) by mouth 2 (two) times daily. 90 capsule 3   rosuvastatin (CRESTOR) 20 MG tablet Take 1 tablet (20 mg total) by mouth daily at 6 PM. 30 tablet 0   torsemide (DEMADEX) 20 MG tablet Take 20 mg by mouth daily.     metoprolol succinate (TOPROL-XL) 50 MG 24 hr tablet Take 25 mg by mouth daily. Take with or immediately following a meal.     torsemide (DEMADEX) 20 MG tablet Take 1 tablet (20 mg total) by mouth daily. 90 tablet 3   famotidine (PEPCID) 20 MG tablet Take 1 tablet (20 mg total) by mouth at bedtime. (Patient not taking: Reported on 11/01/2020) 90 tablet 3   sucralfate (CARAFATE) 1 GM/10ML suspension Take 10 mLs (1 g total) by mouth 4 (four) times daily -  with meals and at bedtime. (Patient not taking: Reported on 11/01/2020) 1200 mL 0   metoprolol succinate (TOPROL-XL) 50 MG 24 hr tablet Take 1 tablet (50 mg total) by mouth daily. Take with or immediately following a meal. 90 tablet 3   No facility-administered medications prior to visit.     Allergies:   Iodinated diagnostic agents, Nerve block tray, Nitrofuran derivatives, Triamcinolone acetonide, and Vibra-tab [doxycycline]   Social History   Socioeconomic History   Marital status: Married    Spouse name: Not on file   Number of children: Not on file   Years  of education: Not on file   Highest education level: Not on file  Occupational History   Not on file  Tobacco Use   Smoking status: Never   Smokeless tobacco: Never  Vaping Use   Vaping Use: Never used  Substance and Sexual Activity   Alcohol use: No   Drug use: No   Sexual activity: Never  Other Topics Concern   Not on file  Social History Narrative   Nonsmoker.  She lives alone.  Her son calls her every day to check on her.  Has a daughter involved in care as well.     Social Determinants of Health   Financial Resource Strain: Not on file  Food Insecurity: Not on file  Transportation Needs: Not on file  Physical Activity: Not on file  Stress: Not on file  Social Connections: Not on file  Family History:  The patient's family history includes Healthy in her brother, daughter, son, and son; Hiatal hernia in her sister; Parkinsonism in her father; Rheum arthritis in her mother.   Review of Systems:    Please see the history of present illness.     All other systems reviewed and are otherwise negative except as noted above.   Physical Exam:    VS:  BP 116/66   Pulse 80   Ht 5\' 2"  (1.575 m)   Wt 100 lb 3.2 oz (45.5 kg)   SpO2 93%   BMI 18.33 kg/m    General: Thin elderly appearing in no acute distress. Head: Normocephalic, atraumatic. Neck: No carotid bruits. JVD not elevated.  Lungs: Respirations regular and unlabored, without wheezes or rales.  Heart: Regular rate and rhythm. No S3 or S4.  No murmur, no rubs, or gallops appreciated. Abdomen: Appears non-distended. No obvious abdominal masses. Msk:  Strength and tone appear normal for age. No obvious joint deformities or effusions. Extremities: No clubbing or cyanosis. No pitting edema.  Distal pedal pulses are 2+ bilaterally. Neuro: Alert and oriented X 3. Moves all extremities spontaneously. No focal deficits noted. Psych:  Responds to questions appropriately with a normal affect. Skin: No rashes or lesions  noted  Wt Readings from Last 3 Encounters:  11/01/20 100 lb 3.2 oz (45.5 kg)  10/31/20 102 lb 9.6 oz (46.5 kg)  09/19/20 106 lb 3.2 oz (48.2 kg)     Studies/Labs Reviewed:   EKG:  EKG is not ordered today.    Recent Labs: 02/15/2020: TSH 0.385 02/16/2020: B Natriuretic Peptide 328.0 05/16/2020: Magnesium 1.5 11/01/2020: ALT 21; BUN 21; Creatinine, Ser 0.76; Hemoglobin 11.1; Platelets 148; Potassium 3.8; Sodium 138   Lipid Panel    Component Value Date/Time   CHOL 248 (H) 08/26/2018 0545   TRIG 608 (H) 08/26/2018 0545   HDL 33 (L) 08/26/2018 0545   CHOLHDL 7.5 08/26/2018 0545   VLDL UNABLE TO CALCULATE IF TRIGLYCERIDE OVER 400 mg/dL 08/26/2018 0545   LDLCALC UNABLE TO CALCULATE IF TRIGLYCERIDE OVER 400 mg/dL 08/26/2018 0545   LDLDIRECT 177.9 (H) 08/26/2018 0545    Additional studies/ records that were reviewed today include:   NST: 10/2018 Equivocal, nondiagnostic ST segment depression noted with persistent T wave inversions. Small, moderate intensity, mid to basal inferoseptal defect that is fixed and most prominent on the rest imaging. This is consistent with soft tissue attenuation. No reversible perfusion defects to indicate ischemia. This is a low risk study. Nuclear stress EF: 72%.  Echocardiogram: 09/2019 IMPRESSIONS     1. Left ventricular ejection fraction, by estimation, is 70 to 75%. The  left ventricle has normal function. The left ventricle has no regional  wall motion abnormalities. Left ventricular diastolic parameters are  indeterminate.   2. Right ventricular systolic function is normal. The right ventricular  size is normal.   3. Left atrial size was mildly dilated.   4. The mitral valve is normal in structure. Trivial mitral valve  regurgitation. No evidence of mitral stenosis.   5. The aortic valve is tricuspid. Aortic valve regurgitation is mild. No  aortic stenosis is present.   6. The inferior vena cava is normal in size with greater than 50%   respiratory variability, suggesting right atrial pressure of 3 mmHg.  Assessment:    1. Paroxysmal atrial fibrillation (HCC)   2. Medication management   3. Leg edema   4. History of CVA (cerebrovascular accident)   5. Failure to  thrive in adult      Plan:   In order of problems listed above:  1. Paroxysmal Atrial Fibrillation - She is s/p AV node ablation and PPM placement in 03/2020. She denies any recent palpitations and recent remote interrogation showed her device was functioning normally.  - Given her fatigue and weight loss, will reduce Toprol-XL from 50mg  daily to 25mg  daily. - No reports of active bleeding. She remains on Eliquis 2.5mg  BID for anticoagulation which is the appropriate dose at this time based off her age, weight and kidney function. Recheck CBC and BMET today given her significant fatigue.   2. History of Lower Extremity Edema - She does not have any edema on examination today and her PO intake continues to decrease. She remains on Torsemide 20mg  daily and will recheck a BMET today for reassessment of her renal function and electrolytes.   3. History of CVA - She remains on Crestor 20mg  daily. Not on ASA given the need for anticoagulation.   4. Failure to Thrive - She continues to lose weight and her son reports she is consuming minimal food and liquids. She is undergoing work-up by GI for further investigation but if no etiology is identified, she would likely benefit from Palliative Care as she appears very frail and her quality of life has significantly declined over the past few months.    Medication Adjustments/Labs and Tests Ordered: Current medicines are reviewed at length with the patient today.  Concerns regarding medicines are outlined above.  Medication changes, Labs and Tests ordered today are listed in the Patient Instructions below. Patient Instructions  Medication Instructions:  Your physician has recommended you make the following change in  your medication:  Decrease metoprolol succinate to 25 mg daily Continue other medications the same  Labwork: CMET & CBC today at [facility]  Testing/Procedures: none  Follow-Up: Your physician recommends that you schedule a follow-up appointment in: 3 months  Any Other Special Instructions Will Be Listed Below (If Applicable).  If you need a refill on your cardiac medications before your next appointment, please call your pharmacy.   Signed, Erma Heritage, PA-C  11/02/2020 1:58 PM    [facility] [address] [place], [place] [address] Phone: [phone] Fax: [phone]

## 2020-11-01 NOTE — Patient Instructions (Addendum)
Medication Instructions:  Your physician has recommended you make the following change in your medication:  Decrease metoprolol succinate to 25 mg daily Continue other medications the same  Labwork: CMET & CBC today at [facility]  Testing/Procedures: none  Follow-Up: Your physician recommends that you schedule a follow-up appointment in: 3 months  Any Other Special Instructions Will Be Listed Below (If Applicable).  If you need a refill on your cardiac medications before your next appointment, please call your pharmacy.

## 2020-11-06 DIAGNOSIS — I10 Essential (primary) hypertension: Secondary | ICD-10-CM | POA: Diagnosis not present

## 2020-11-06 DIAGNOSIS — C189 Malignant neoplasm of colon, unspecified: Secondary | ICD-10-CM | POA: Diagnosis not present

## 2020-11-06 DIAGNOSIS — G894 Chronic pain syndrome: Secondary | ICD-10-CM | POA: Diagnosis not present

## 2020-11-06 DIAGNOSIS — B0229 Other postherpetic nervous system involvement: Secondary | ICD-10-CM | POA: Diagnosis not present

## 2020-11-08 ENCOUNTER — Encounter (INDEPENDENT_AMBULATORY_CARE_PROVIDER_SITE_OTHER): Payer: Self-pay

## 2020-11-08 ENCOUNTER — Emergency Department (HOSPITAL_COMMUNITY): Payer: Medicare Other

## 2020-11-08 ENCOUNTER — Inpatient Hospital Stay (HOSPITAL_COMMUNITY)
Admission: EM | Admit: 2020-11-08 | Discharge: 2020-11-13 | DRG: 177 | Disposition: A | Discharge status: Skilled Nursing Facility | Payer: Medicare Other | Attending: Family Medicine | Admitting: Family Medicine

## 2020-11-08 ENCOUNTER — Other Ambulatory Visit (INDEPENDENT_AMBULATORY_CARE_PROVIDER_SITE_OTHER): Payer: Self-pay

## 2020-11-08 ENCOUNTER — Encounter (HOSPITAL_COMMUNITY): Payer: Self-pay | Admitting: *Deleted

## 2020-11-08 DIAGNOSIS — Z820 Family history of epilepsy and other diseases of the nervous system: Secondary | ICD-10-CM | POA: Diagnosis not present

## 2020-11-08 DIAGNOSIS — J449 Chronic obstructive pulmonary disease, unspecified: Secondary | ICD-10-CM | POA: Diagnosis not present

## 2020-11-08 DIAGNOSIS — Z85038 Personal history of other malignant neoplasm of large intestine: Secondary | ICD-10-CM

## 2020-11-08 DIAGNOSIS — E876 Hypokalemia: Secondary | ICD-10-CM | POA: Diagnosis present

## 2020-11-08 DIAGNOSIS — Z79891 Long term (current) use of opiate analgesic: Secondary | ICD-10-CM

## 2020-11-08 DIAGNOSIS — K8689 Other specified diseases of pancreas: Secondary | ICD-10-CM | POA: Diagnosis not present

## 2020-11-08 DIAGNOSIS — Z8261 Family history of arthritis: Secondary | ICD-10-CM | POA: Diagnosis not present

## 2020-11-08 DIAGNOSIS — Z515 Encounter for palliative care: Secondary | ICD-10-CM | POA: Diagnosis not present

## 2020-11-08 DIAGNOSIS — R110 Nausea: Secondary | ICD-10-CM | POA: Diagnosis not present

## 2020-11-08 DIAGNOSIS — J440 Chronic obstructive pulmonary disease with acute lower respiratory infection: Secondary | ICD-10-CM | POA: Diagnosis not present

## 2020-11-08 DIAGNOSIS — I10 Essential (primary) hypertension: Secondary | ICD-10-CM | POA: Diagnosis not present

## 2020-11-08 DIAGNOSIS — G8929 Other chronic pain: Secondary | ICD-10-CM

## 2020-11-08 DIAGNOSIS — I480 Paroxysmal atrial fibrillation: Secondary | ICD-10-CM | POA: Diagnosis not present

## 2020-11-08 DIAGNOSIS — Z90710 Acquired absence of both cervix and uterus: Secondary | ICD-10-CM

## 2020-11-08 DIAGNOSIS — Z6821 Body mass index (BMI) 21.0-21.9, adult: Secondary | ICD-10-CM

## 2020-11-08 DIAGNOSIS — K219 Gastro-esophageal reflux disease without esophagitis: Secondary | ICD-10-CM | POA: Diagnosis present

## 2020-11-08 DIAGNOSIS — Z8673 Personal history of transient ischemic attack (TIA), and cerebral infarction without residual deficits: Secondary | ICD-10-CM | POA: Diagnosis not present

## 2020-11-08 DIAGNOSIS — K2970 Gastritis, unspecified, without bleeding: Secondary | ICD-10-CM | POA: Diagnosis present

## 2020-11-08 DIAGNOSIS — K5730 Diverticulosis of large intestine without perforation or abscess without bleeding: Secondary | ICD-10-CM | POA: Diagnosis not present

## 2020-11-08 DIAGNOSIS — R1013 Epigastric pain: Secondary | ICD-10-CM | POA: Diagnosis not present

## 2020-11-08 DIAGNOSIS — Z950 Presence of cardiac pacemaker: Secondary | ICD-10-CM | POA: Diagnosis not present

## 2020-11-08 DIAGNOSIS — Z66 Do not resuscitate: Secondary | ICD-10-CM | POA: Diagnosis not present

## 2020-11-08 DIAGNOSIS — U071 COVID-19: Secondary | ICD-10-CM | POA: Diagnosis not present

## 2020-11-08 DIAGNOSIS — R54 Age-related physical debility: Secondary | ICD-10-CM | POA: Diagnosis not present

## 2020-11-08 DIAGNOSIS — E785 Hyperlipidemia, unspecified: Secondary | ICD-10-CM | POA: Diagnosis present

## 2020-11-08 DIAGNOSIS — R627 Adult failure to thrive: Secondary | ICD-10-CM | POA: Diagnosis not present

## 2020-11-08 DIAGNOSIS — M6281 Muscle weakness (generalized): Secondary | ICD-10-CM | POA: Diagnosis not present

## 2020-11-08 DIAGNOSIS — Z7901 Long term (current) use of anticoagulants: Secondary | ICD-10-CM

## 2020-11-08 DIAGNOSIS — I482 Chronic atrial fibrillation, unspecified: Secondary | ICD-10-CM | POA: Diagnosis present

## 2020-11-08 DIAGNOSIS — M069 Rheumatoid arthritis, unspecified: Secondary | ICD-10-CM | POA: Diagnosis not present

## 2020-11-08 DIAGNOSIS — E782 Mixed hyperlipidemia: Secondary | ICD-10-CM | POA: Diagnosis not present

## 2020-11-08 DIAGNOSIS — J1282 Pneumonia due to coronavirus disease 2019: Secondary | ICD-10-CM | POA: Diagnosis present

## 2020-11-08 DIAGNOSIS — Z7189 Other specified counseling: Secondary | ICD-10-CM | POA: Diagnosis not present

## 2020-11-08 DIAGNOSIS — J9600 Acute respiratory failure, unspecified whether with hypoxia or hypercapnia: Secondary | ICD-10-CM | POA: Diagnosis present

## 2020-11-08 DIAGNOSIS — E46 Unspecified protein-calorie malnutrition: Secondary | ICD-10-CM | POA: Diagnosis present

## 2020-11-08 DIAGNOSIS — Z79899 Other long term (current) drug therapy: Secondary | ICD-10-CM

## 2020-11-08 DIAGNOSIS — J9601 Acute respiratory failure with hypoxia: Secondary | ICD-10-CM

## 2020-11-08 DIAGNOSIS — K838 Other specified diseases of biliary tract: Secondary | ICD-10-CM | POA: Diagnosis not present

## 2020-11-08 DIAGNOSIS — Z91041 Radiographic dye allergy status: Secondary | ICD-10-CM

## 2020-11-08 DIAGNOSIS — K7460 Unspecified cirrhosis of liver: Secondary | ICD-10-CM | POA: Diagnosis not present

## 2020-11-08 DIAGNOSIS — M549 Dorsalgia, unspecified: Secondary | ICD-10-CM | POA: Diagnosis not present

## 2020-11-08 DIAGNOSIS — R059 Cough, unspecified: Secondary | ICD-10-CM | POA: Diagnosis not present

## 2020-11-08 DIAGNOSIS — I4891 Unspecified atrial fibrillation: Secondary | ICD-10-CM | POA: Diagnosis present

## 2020-11-08 DIAGNOSIS — Z888 Allergy status to other drugs, medicaments and biological substances status: Secondary | ICD-10-CM

## 2020-11-08 DIAGNOSIS — R0602 Shortness of breath: Secondary | ICD-10-CM | POA: Diagnosis not present

## 2020-11-08 DIAGNOSIS — I4821 Permanent atrial fibrillation: Secondary | ICD-10-CM | POA: Diagnosis not present

## 2020-11-08 DIAGNOSIS — Z9049 Acquired absence of other specified parts of digestive tract: Secondary | ICD-10-CM

## 2020-11-08 LAB — CBC WITH DIFFERENTIAL/PLATELET
Abs Immature Granulocytes: 0.04 10*3/uL (ref 0.00–0.07)
Basophils Absolute: 0 10*3/uL (ref 0.0–0.1)
Basophils Relative: 1 %
Eosinophils Absolute: 0.1 10*3/uL (ref 0.0–0.5)
Eosinophils Relative: 2 %
HCT: 33.4 % — ABNORMAL LOW (ref 36.0–46.0)
Hemoglobin: 10.9 g/dL — ABNORMAL LOW (ref 12.0–15.0)
Immature Granulocytes: 1 %
Lymphocytes Relative: 14 %
Lymphs Abs: 0.6 10*3/uL — ABNORMAL LOW (ref 0.7–4.0)
MCH: 31.9 pg (ref 26.0–34.0)
MCHC: 32.6 g/dL (ref 30.0–36.0)
MCV: 97.7 fL (ref 80.0–100.0)
Monocytes Absolute: 0.5 10*3/uL (ref 0.1–1.0)
Monocytes Relative: 12 %
Neutro Abs: 2.9 10*3/uL (ref 1.7–7.7)
Neutrophils Relative %: 70 %
Platelets: 141 10*3/uL — ABNORMAL LOW (ref 150–400)
RBC: 3.42 MIL/uL — ABNORMAL LOW (ref 3.87–5.11)
RDW: 14.8 % (ref 11.5–15.5)
WBC: 4.1 10*3/uL (ref 4.0–10.5)
nRBC: 0 % (ref 0.0–0.2)

## 2020-11-08 LAB — COMPREHENSIVE METABOLIC PANEL
ALT: 14 U/L (ref 0–44)
AST: 29 U/L (ref 15–41)
Albumin: 3 g/dL — ABNORMAL LOW (ref 3.5–5.0)
Alkaline Phosphatase: 55 U/L (ref 38–126)
Anion gap: 9 (ref 5–15)
BUN: 17 mg/dL (ref 8–23)
CO2: 29 mmol/L (ref 22–32)
Calcium: 7.8 mg/dL — ABNORMAL LOW (ref 8.9–10.3)
Chloride: 101 mmol/L (ref 98–111)
Creatinine, Ser: 0.56 mg/dL (ref 0.44–1.00)
GFR, Estimated: >60 mL/min (ref >60)
Glucose, Bld: 107 mg/dL — ABNORMAL HIGH (ref 70–99)
Potassium: 3.6 mmol/L (ref 3.5–5.1)
Sodium: 139 mmol/L (ref 135–145)
Total Bilirubin: 0.3 mg/dL (ref 0.3–1.2)
Total Protein: 6 g/dL — ABNORMAL LOW (ref 6.5–8.1)

## 2020-11-08 LAB — URINALYSIS, ROUTINE W REFLEX MICROSCOPIC
Bacteria, UA: NONE SEEN
Bilirubin Urine: NEGATIVE
Glucose, UA: NEGATIVE mg/dL
Hgb urine dipstick: NEGATIVE
Ketones, ur: NEGATIVE mg/dL
Leukocytes,Ua: NEGATIVE
Nitrite: NEGATIVE
Protein, ur: 30 mg/dL — AB
Specific Gravity, Urine: 1.016 (ref 1.005–1.030)
pH: 5 (ref 5.0–8.0)

## 2020-11-08 LAB — LIPASE, BLOOD: Lipase: 24 U/L (ref 11–51)

## 2020-11-08 LAB — RESP PANEL BY RT-PCR (FLU A&B, COVID) ARPGX2
Influenza A by PCR: NEGATIVE
Influenza B by PCR: NEGATIVE
SARS Coronavirus 2 by RT PCR: POSITIVE — AB

## 2020-11-08 LAB — MAGNESIUM: Magnesium: 1.6 mg/dL — ABNORMAL LOW (ref 1.7–2.4)

## 2020-11-08 MED ORDER — SUCRALFATE 1 GM/10ML PO SUSP
1.0000 g | Freq: Three times a day (TID) | ORAL | Status: DC
  Administered 2020-11-09 – 2020-11-13 (×18): 1 g via ORAL
  Filled 2020-11-08 (×18): qty 10

## 2020-11-08 MED ORDER — METHYLPREDNISOLONE SODIUM SUCC 125 MG IJ SOLR
80.0000 mg | Freq: Two times a day (BID) | INTRAMUSCULAR | Status: DC
  Administered 2020-11-08 – 2020-11-12 (×8): 80 mg via INTRAVENOUS
  Filled 2020-11-08 (×8): qty 2

## 2020-11-08 MED ORDER — METOPROLOL SUCCINATE ER 25 MG PO TB24
25.0000 mg | ORAL_TABLET | Freq: Every day | ORAL | Status: DC
  Administered 2020-11-09 – 2020-11-13 (×5): 25 mg via ORAL
  Filled 2020-11-08 (×5): qty 1

## 2020-11-08 MED ORDER — ROSUVASTATIN CALCIUM 20 MG PO TABS
20.0000 mg | ORAL_TABLET | Freq: Every day | ORAL | Status: DC
  Administered 2020-11-09 – 2020-11-13 (×5): 20 mg via ORAL
  Filled 2020-11-08 (×5): qty 1

## 2020-11-08 MED ORDER — SODIUM CHLORIDE 0.9 % IV SOLN
200.0000 mg | Freq: Once | INTRAVENOUS | Status: AC
  Administered 2020-11-08: 200 mg via INTRAVENOUS
  Filled 2020-11-08: qty 40

## 2020-11-08 MED ORDER — MELATONIN 3 MG PO TABS
6.0000 mg | ORAL_TABLET | Freq: Every day | ORAL | Status: DC
  Administered 2020-11-08 – 2020-11-12 (×5): 6 mg via ORAL
  Filled 2020-11-08 (×5): qty 2

## 2020-11-08 MED ORDER — ONDANSETRON HCL 4 MG/2ML IJ SOLN
4.0000 mg | Freq: Once | INTRAMUSCULAR | Status: AC
  Administered 2020-11-08: 4 mg via INTRAVENOUS
  Filled 2020-11-08: qty 2

## 2020-11-08 MED ORDER — SODIUM CHLORIDE 0.9 % IV SOLN: 100.0000 mg | Freq: Every day | INTRAVENOUS | Status: DC

## 2020-11-08 MED ORDER — PANTOPRAZOLE SODIUM 40 MG IV SOLR
40.0000 mg | Freq: Two times a day (BID) | INTRAVENOUS | Status: DC
  Administered 2020-11-08 – 2020-11-13 (×10): 40 mg via INTRAVENOUS
  Filled 2020-11-08 (×10): qty 40

## 2020-11-08 MED ORDER — MELATONIN 5 MG PO TABS
5.0000 mg | ORAL_TABLET | Freq: Every day | ORAL | Status: DC
  Filled 2020-11-08 (×3): qty 1

## 2020-11-08 MED ORDER — MORPHINE SULFATE (PF) 4 MG/ML IV SOLN
2.0000 mg | Freq: Once | INTRAVENOUS | Status: AC
Start: 2020-11-08 — End: 2020-11-08
  Administered 2020-11-08: 2 mg via INTRAVENOUS
  Filled 2020-11-08: qty 1

## 2020-11-08 MED ORDER — MORPHINE SULFATE (PF) 4 MG/ML IV SOLN
2.0000 mg | Freq: Once | INTRAVENOUS | Status: AC
  Administered 2020-11-08: 2 mg via INTRAVENOUS
  Filled 2020-11-08: qty 1

## 2020-11-08 MED ORDER — SODIUM CHLORIDE 0.9 % IV SOLN
100.0000 mg | Freq: Every day | INTRAVENOUS | Status: AC
  Administered 2020-11-09 – 2020-11-12 (×4): 100 mg via INTRAVENOUS
  Filled 2020-11-08 (×4): qty 20

## 2020-11-08 MED ORDER — CALCIUM CARBONATE ANTACID 500 MG PO CHEW
1.0000 | CHEWABLE_TABLET | Freq: Three times a day (TID) | ORAL | Status: DC
  Administered 2020-11-10 – 2020-11-11 (×2): 200 mg via ORAL
  Filled 2020-11-08 (×7): qty 1

## 2020-11-08 MED ORDER — HYOSCYAMINE SULFATE 0.125 MG PO TABS
0.1250 mg | ORAL_TABLET | Freq: Three times a day (TID) | ORAL | Status: DC | PRN
  Filled 2020-11-08: qty 1

## 2020-11-08 MED ORDER — LACTATED RINGERS IV BOLUS
500.0000 mL | Freq: Once | INTRAVENOUS | Status: AC
  Administered 2020-11-08: 500 mL via INTRAVENOUS

## 2020-11-08 MED ORDER — GUAIFENESIN-DM 100-10 MG/5ML PO SYRP
10.0000 mL | ORAL_SOLUTION | ORAL | Status: DC | PRN
  Administered 2020-11-10 – 2020-11-11 (×2): 10 mL via ORAL
  Filled 2020-11-08 (×2): qty 10

## 2020-11-08 MED ORDER — APIXABAN 2.5 MG PO TABS
2.5000 mg | ORAL_TABLET | Freq: Two times a day (BID) | ORAL | Status: DC
  Administered 2020-11-08 – 2020-11-13 (×10): 2.5 mg via ORAL
  Filled 2020-11-08 (×10): qty 1

## 2020-11-08 MED ORDER — POTASSIUM CHLORIDE CRYS ER 20 MEQ PO TBCR
40.0000 meq | EXTENDED_RELEASE_TABLET | Freq: Once | ORAL | Status: AC
  Administered 2020-11-08: 40 meq via ORAL
  Filled 2020-11-08: qty 2

## 2020-11-08 MED ORDER — SODIUM CHLORIDE 0.9 % IV SOLN: 200.0000 mg | Freq: Once | INTRAVENOUS | Status: DC

## 2020-11-08 MED ORDER — HYDROCOD POLST-CPM POLST ER 10-8 MG/5ML PO SUER
5.0000 mL | Freq: Two times a day (BID) | ORAL | Status: DC | PRN
Start: 2020-11-08 — End: 2020-11-11
  Administered 2020-11-09 – 2020-11-11 (×3): 5 mL via ORAL
  Filled 2020-11-08 (×3): qty 5

## 2020-11-08 NOTE — H&P (Addendum)
TRH H&P   Patient Demographics:    [name], is a 87 y.o. female  MRN: [id number]   DOB - Oct 31, 1933  Admit Date - 11/08/2020  Outpatient Primary MD for the patient is Sharilyn Sites, MD  Referring MD/NP/PA: PA christian  Patient coming from: home  Chief Complaint  Patient presents with   Nausea      HPI:    [name]  is a 87 y.o. female, with past medical history relevant for chronic atrial fibrillation, HTN,, HLD, rheumatoid arthritis, CVA, L1/L2 fracture status post kyphoplasty, chronic lower back pain, A. fib, uncontrolled in the past requiring multiple DCCV's, requiring AV node ablation and PPM implant in 03/2020. -Patient presents to ED secondary to multiple complaints, mainly failure to thrive, she does report chronic nausea, chronic abdominal pain, inability to keep food down, weight loss, very poor appetite, as well recently she does start endorsing some shortness of breath, cough as well, but with known history of interstitial lung disease, COPD, as well she has RA, but with no oxygen requirement currently. - in ED was noted to be hypoxic, in the low 80s on room air, chest x-ray was significant for multifocal opacities, she is with known chronic interstitial lung disease, her COVID-19 was positive, ET abdomen pelvis with significant abnormalities which appears to be more chronic , he had hospitalist consulted to admit the patient given her COVID-19 positive status and new oxygen requirement.   Review of systems:    In addition to the HPI above,  No Fever-chills, but failure to thrive, weight loss, fatigue, poor appetite No Headache, No changes with Vision or hearing, No problems swallowing food or Liquids, No Chest pain, complains of cough and shortness of Breath, Does report chronic abdominal pain, nausea no Vommitting, Bowel movements are regular, No Blood  in stool or Urine, No dysuria, No new skin rashes or bruises, No new joints pains-aches, he has chronic lower back pain No new weakness, tingling, numbness in any extremity, No recent weight gain or loss, No polyuria, polydypsia or polyphagia, No significant Mental Stressors.  A full 10 point Review of Systems was done, except as stated above, all other Review of Systems were negative.   With Past History of the following :    Past Medical History:  Diagnosis Date   A-fib [facility])    Atrial fibrillation ([place])    Cervicalgia    Chronic bronchitis with COPD (chronic obstructive pulmonary disease) ([place])    Colon cancer (HCC)    Degeneration of cervical intervertebral disc    History of multiple strokes 2021   2 strokes within same day   Hypertension    Pinched nerve in shoulder    Rheumatoid arthritis(714.0)    RUQ pain    Vulvar dystrophy    Yeast infection       Past Surgical History:  Procedure Laterality Date   ABDOMINAL  HYSTERECTOMY  09/13/1958   APPENDECTOMY  with hysterectomy   AV NODE ABLATION N/A 04/08/2020   Procedure: AV NODE ABLATION;  Surgeon: Evans Lance, MD;  Location: [place] [facility];  Service: Cardiovascular;  Laterality: N/A;   BACK SURGERY  12/08/2010   plate/screws C3 C4   CARDIOVERSION N/A 09/29/2019   Procedure: CARDIOVERSION;  Surgeon: Arnoldo Lenis, MD;  [facility];  Service: Endoscopy;  Laterality: N/A;  pt ate early this AM per Dr. Harl Bowie   CARDIOVERSION N/A 02/26/2020   Procedure: CARDIOVERSION;  Surgeon: Sueanne Margarita, MD;  Location: [facility] ENDOSCOPY;  Service: Cardiovascular;  Laterality: N/A;   CHOLECYSTECTOMY  01/13/2000   COLONOSCOPY  04/02/2009   COLONOSCOPY  06/04/2005   COLONOSCOPY N/A 07/26/2015   Rehman:examined portion of ileum normal, patent end to end ileocolonic anastomosis, characterized by healthy appearing mucosa, diverticulosis in sigmoid colon, exam otherwise normal, internal hemorrhoids, no specimens    COLONOSCOPY WITH ESOPHAGOGASTRODUODENOSCOPY (EGD) N/A 04/08/2012   Procedure: COLONOSCOPY WITH ESOPHAGOGASTRODUODENOSCOPY (EGD);  Surgeon: Rogene Houston, MD;  [facility];  Service: Endoscopy;  Laterality: N/A;  130-moved to 245 Ann notified pt   ESOPHAGOGASTRODUODENOSCOPY N/A 07/26/2015   Rehman: normal esophagus, z line regular 40 cm from incisors, normal stomach, normal duodenal bulb and second portion of duodenum, no specimens.   HEMICOLECTOMY  10/13/2003   PACEMAKER IMPLANT N/A 04/08/2020   Procedure: PACEMAKER IMPLANT;  Surgeon: Evans Lance, MD;  Location: [place] [facility];  Service: Cardiovascular;  Laterality: N/A;      Social History:     Social History   Tobacco Use   Smoking status: Never   Smokeless tobacco: Never  Substance Use Topics   Alcohol use: No       Family History :     Family History  Problem Relation Age of Onset   Rheum arthritis Mother    Parkinsonism Father    Hiatal hernia Sister    Healthy Brother    Healthy Daughter    Healthy Son    Healthy Son      Home Medications:   Prior to Admission medications   Medication Sig Start Date End Date Taking? Authorizing Provider  apixaban (ELIQUIS) 2.5 MG TABS tablet Take 1 tablet (2.5 mg total) by mouth 2 (two) times daily. 09/08/18   Gerlene Fee, NP  Calcium Carbonate Antacid (TUMS PO) Take by mouth.    [provider]  famotidine (PEPCID) 20 MG tablet Take 1 tablet (20 mg total) by mouth at bedtime. Patient not taking: Reported on 11/01/2020 09/19/20   Gabriel Rung, NP  HYDROcodone-acetaminophen (NORCO) 7.5-325 MG tablet Take 1 tablet by mouth 3 (three) times daily as needed for moderate pain. 09/08/18   Gerlene Fee, NP  hyoscyamine (LEVSIN) 0.125 MG tablet Take 1 tablet (0.125 mg total) by mouth every 8 (eight) hours as needed (abdominal pain). 10/31/20   Harvel Quale, MD  leflunomide (ARAVA) 20 MG tablet Take 1 tablet (20 mg total) by mouth daily.  09/08/18   Gerlene Fee, NP  Magnesium Oxide 400 MG CAPS Take 1 capsule (400 mg total) by mouth daily. 05/17/20   Strader, Fransisco Hertz, PA-C  metoprolol succinate (TOPROL XL) 25 MG 24 hr tablet Take 1 tablet (25 mg total) by mouth daily. 11/01/20   Strader, Fransisco Hertz, PA-C  Omega-3 Fatty Acids (FISH OIL) 1000 MG CAPS Take 1,000 mg by mouth daily.    [provider]  omeprazole (  PRILOSEC) 40 MG capsule Take 1 capsule (40 mg total) by mouth 2 (two) times daily. 09/19/20   Carlan, Chelsea L, NP  rosuvastatin (CRESTOR) 20 MG tablet Take 1 tablet (20 mg total) by mouth daily at 6 PM. 09/08/18   Nyoka Cowden, Phylis Bougie, NP  sucralfate (CARAFATE) 1 GM/10ML suspension Take 10 mLs (1 g total) by mouth 4 (four) times daily -  with meals and at bedtime. Patient not taking: Reported on 11/01/2020 09/19/20 10/19/20  Gabriel Rung, NP  torsemide (DEMADEX) 20 MG tablet Take 20 mg by mouth daily.    [provider]     Allergies:     Allergies  Allergen Reactions   Iodinated Diagnostic Agents Anaphylaxis    08-12-10 Pt had severe reaction with itching/hives/loss of consciousness within 5 minutes of CEPI today.  She received both Kenalog and Iondinated Contrast, so not certain which agent caused reaction.  If patient returns for another CEPI, Dr. Jobe Igo considers using Decadron and a 13-hour prep.  jkl   Nerve Block Tray Anaphylaxis    Patient states that she was sent to have Epidural for her shoulder. After rec'ving she remembers saying that she was itching all over  And that's it. She was transported to hospital  By EMS. Stayed overnight.   Nitrofuran Derivatives Hives and Nausea And Vomiting   Triamcinolone Acetonide Anaphylaxis    08-12-10  Pt had severe reaction with itching/hives/loss of consciousness within five minutes of CEPI today.  She received both Kenalog and Iodinated Contrast, so not certain which agent caused reaction.  If pt returns for another cervical epidural steroid injection, Dr.  Jobe Igo suggests using Decadron and a 13-hr prep.  JKL PATIENT USES PROCTOCREAM 2.5% WITHOUT DIFFICULTY FOR VULVAR DYSTROPHY Patient says she can take prednisone without any problem   Vibra-Tab [Doxycycline] Nausea And Vomiting     Physical Exam:   Vitals  Blood pressure (!) 155/62, pulse 73, temperature 98.6 F (37 C), temperature source Oral, resp. rate (!) 24, SpO2 95 %.   1. General patient extremely frail, chronically ill-appearing, laying in bed, tachypneic  2. Normal affect and insight, Not Suicidal or Homicidal, Awake Alert, Oriented X 3.  3. No F.N deficits, ALL C.Nerves Intact, Strength 5/5 all 4 extremities, Sensation intact all 4 extremities, Plantars down going.  4. Ears and Eyes appear Normal, Conjunctivae clear, PERRLA. Moist Oral Mucosa.  5. Supple Neck, No JVD, No cervical lymphadenopathy appriciated, No Carotid Bruits.  6. Symmetrical Chest wall movement, scattered Rales and rhonchi  7. RRR, No Gallops, Rubs or Murmurs, No Parasternal Heave.  8. Positive Bowel Sounds, Abdomen Soft, No tenderness, No organomegaly appriciated,No rebound -guarding or rigidity.  9.  No Cyanosis, Normal Skin Turgor, No Skin Rash or Bruise.  10. Good muscle tone,  joints appear normal , no effusions, Normal ROM.  11. No Palpable Lymph Nodes in Neck or Axillae    Data Review:    CBC Recent Labs  Lab 11/08/20 1300  WBC 4.1  HGB 10.9*  HCT 33.4*  PLT 141*  MCV 97.7  MCH 31.9  MCHC 32.6  RDW 14.8  LYMPHSABS 0.6*  MONOABS 0.5  EOSABS 0.1  BASOSABS 0.0   ------------------------------------------------------------------------------------------------------------------  Chemistries  Recent Labs  Lab 11/08/20 1300  NA 139  K 3.6  CL 101  CO2 29  GLUCOSE 107*  BUN 17  CREATININE 0.56  CALCIUM 7.8*  AST 29  ALT 14  ALKPHOS 55  BILITOT 0.3    ------------------------------------------------------------------------------------------------------------------ estimated creatinine  clearance is 36.9 mL/min (by C-G formula based on SCr of 0.56 mg/dL). ------------------------------------------------------------------------------------------------------------------ No results for input(s): TSH, T4TOTAL, T3FREE, THYROIDAB in the last 72 hours.  Invalid input(s): FREET3  Coagulation profile No results for input(s): INR, PROTIME in the last 168 hours. ------------------------------------------------------------------------------------------------------------------- No results for input(s): DDIMER in the last 72 hours. -------------------------------------------------------------------------------------------------------------------  Cardiac Enzymes No results for input(s): CKMB, TROPONINI, MYOGLOBIN in the last 168 hours.  Invalid input(s): CK ------------------------------------------------------------------------------------------------------------------    Component Value Date/Time   BNP 328.0 (H) 02/16/2020 0944     ---------------------------------------------------------------------------------------------------------------  Urinalysis    Component Value Date/Time   COLORURINE YELLOW 11/08/2020 1802   APPEARANCEUR CLEAR 11/08/2020 1802   LABSPEC 1.016 11/08/2020 1802   PHURINE 5.0 11/08/2020 1802   GLUCOSEU NEGATIVE 11/08/2020 1802   HGBUR NEGATIVE 11/08/2020 1802   BILIRUBINUR NEGATIVE 11/08/2020 1802   KETONESUR NEGATIVE 11/08/2020 1802   PROTEINUR 30 (A) 11/08/2020 1802   UROBILINOGEN 0.2 04/23/2014 1300   NITRITE NEGATIVE 11/08/2020 1802   LEUKOCYTESUR NEGATIVE 11/08/2020 1802    ----------------------------------------------------------------------------------------------------------------   Imaging Results:    CT ABDOMEN PELVIS WO CONTRAST  Result Date: 11/08/2020 CLINICAL DATA:  87 year old with  acute nonlocalized abdominal pain. Nausea. Patient reports unable to eat for 2 weeks. EXAM: CT ABDOMEN AND PELVIS WITHOUT CONTRAST TECHNIQUE: Multidetector CT imaging of the abdomen and pelvis was performed following the standard protocol without IV contrast. COMPARISON:  None. FINDINGS: Lower chest: Breathing motion artifact with interstitial coarsening and subpleural reticulation. Trace right pleural thickening. There is bronchiolectasis in the medial right lower lobe. Pacemaker wires are partially included mitral annulus and coronary artery calcifications. Hepatobiliary: No focal lesion on this unenhanced exam. Occasional calcified granuloma. There is questionable capsular nodularity involving the left hepatic lobe. Cholecystectomy. Common bile duct measures 12 mm. No visualized choledocholithiasis. Pancreas: Fatty atrophy. No ductal dilatation or inflammation. No evidence of pancreatic mass on this unenhanced exam. Spleen: Normal in size without focal abnormality. Adrenals/Urinary Tract: Mild adrenal thickening without dominant adrenal nodule. There is no hydronephrosis, renal calculi, or focal renal abnormality. No evidence of focal renal lesion on this unenhanced exam. Unremarkable urinary bladder Stomach/Bowel: Small posterior gastric cardia diverticulum. Suggestion of diffuse gastric wall thickening. Wall thickening about the greater curvature of 18 mm. Lack of gastric distension limits detailed assessment. No small bowel obstruction or inflammatory change. Administered enteric contrast is seen throughout the colon. Suspected right hemicolectomy with enteric anastomosis in the right upper quadrant. Distal colonic diverticulosis without diverticulitis. There may be circumferential rectal wall thickening, series 2, image 88. Vascular/Lymphatic: Advanced aortic atherosclerosis. No aortic aneurysm. No portal venous or mesenteric gas. No enlarged lymph nodes in the abdomen or pelvis. Reproductive: Status post  hysterectomy. No adnexal masses. Other: No free air or ascites.  No abdominal wall hernia. Musculoskeletal: L1 and L2 compression fractures with vertebral augmentation. The bones are diffusely under mineralized. No acute osseous abnormalities are seen. IMPRESSION: 1. Suggestion of diffuse gastric wall thickening, including the greater curvature. This may represent gastritis or peptic ulcer disease, neoplastic involvement not excluded. Recommend correlation with endoscopy. 2. Possible circumferential rectal wall thickening, recommend correlation with physical exam. 3. Cholecystectomy with common bile duct dilatation measuring 12 mm, likely secondary to prior cholecystectomy. Recommend correlation with LFTs. 4. Colonic diverticulosis without diverticulitis. 5. Possible capsular nodularity involving the left hepatic lobe, can be seen in the setting of early cirrhosis. Recommend correlation with cirrhosis risk factors. Aortic Atherosclerosis (ICD10-I70.0). Electronically Signed   By: Keith Rake M.D.   On:  11/08/2020 17:07   DG Chest 2 View  Result Date: 11/08/2020 CLINICAL DATA:  Cough, shortness of breath EXAM: CHEST - 2 VIEW COMPARISON:  04/09/2020 FINDINGS: The heart size and mediastinal contours are within normal limits. Left chest single lead pacer. Unchanged elevation of the right hemidiaphragm. Mild, diffuse bilateral interstitial pulmonary opacity. The visualized skeletal structures are unremarkable. IMPRESSION: Mild, diffuse bilateral interstitial pulmonary opacity, likely edema. No focal airspace opacity. Electronically Signed   By: Delanna Ahmadi M.D.   On: 11/08/2020 14:08    My personal review of EKG: Rhythm NSR,  QRS duration 109 ms QT/QTcB 508/564 ms P-R-T axes 183 -42 -86 Sinus or ectopic atrial rhythm Short PR interval Inferior infarct, old Anterior infarct, age indeterminate Prolonged QT interval    Assessment & Plan:    Active Problems:   Abdominal pain, chronic, epigastric    Essential hypertension   Atrial fibrillation (HCC)   Chronic anticoagulation/Eliquis for Afib   GERD without esophagitis   Acute respiratory failure (HCC)   Acute hypoxic respiratory failure -She reports dyspnea, she has hypoxia as well, he is dyspneic, tachypneic when speaking, and saturation goes 82 to 85% on room air. -He is certain with underlying interstitial lung disease as evident on previous imaging, but this x-ray has some multifocal opacity, as well she had no oxygen requirement in the past. -She Nguyen be treated for COVID-19 Pneumonia, given her hypoxia she Nguyen be started on steroids, and she Nguyen be started on remdesivir as well. -At high risk for worsening respiratory failure in the setting of interstitial lung disease, extremely frail, immunosuppressive status due to her RA treatment -She was encouraged to use incentive spirometer, flutter valve  Chronic abdominal pain, abnormal finding on CT abdomen pelvis on admission including diffuse gastric wall thickening, circumferential rectal wall thickening, nodularity of left hepatic lobe -Can be followed as an outpatient with patient primary GI. -But I Nguyen keep her on IV Protonix here as she is on steroids, and having gastritis on imaging.  Atrial fibrillation -Continue with Eliquis for anticoagulation -Continue with metoprolol for heart rate control  RA - Nguyen hold ARAVA  chronic back pain -continue home medications   physical deconditioning and weakness/failure to thrive -Patient is extremely frail, deconditioned, Nguyen consult PT/OT -We Nguyen consult nutritionist service as well  Prolonged QTC -Significantly prolonged at 564, Nguyen correct hypokalemia, Nguyen check magnesium, Nguyen avoid prolonging agents and monitor on telemetry.  GERD/nausea -continue with PPI   HLD -continue statins    HTN -Continue with home medications     DVT Prophylaxis on Eliquis  AM Labs Ordered, also please review Full  Orders  Family Communication: Admission, patients condition and plan of care including tests being ordered have been discussed with the patient and daughter who indicate understanding and agree with the plan and Code Status.  Code Status DNR, confirmed by daughter  Likely DC to  home  Condition GUARDED    Consults called: none    Admission status: inpatient    Time spent in minutes : 66 minute   Phillips Climes M.D on 11/08/2020 at 7:02 PM   [facility] - Office  [phone]

## 2020-11-08 NOTE — ED Provider Notes (Addendum)
[facility] EMERGENCY DEPARTMENT Provider Note   CSN: [id number] Arrival date & time: 11/08/20  1154     History Chief Complaint  Patient presents with   Nausea    [name] is a 87 y.o. female with a PMH significant for Afib, stroke, rheumatic lung disease, COPD who presents with acute on chronic abdominal pain, nausea, inability to keep food down, weight loss. Patient reports she is being seen by GI for investigation of ongoing weight loss, nausea, abdominal pain but has not yet been able to be scheduled for a planned MRI. She denies shortness of breath, chest pain, does endorse that she has required oxygen for her COPD in the past but not currently.  Patient does have a chronic cough, however she reports that it is worsened recently.  Patient reports the pain, nausea very severe at this time, and that she feels like she wants to die if she cannot get to feeling better.  HPI     Past Medical History:  Diagnosis Date   A-fib [facility])    Atrial fibrillation ([place])    Cervicalgia    Chronic bronchitis with COPD (chronic obstructive pulmonary disease) ([place])    Colon cancer ([place])    Degeneration of cervical intervertebral disc    History of multiple strokes 2021   2 strokes within same day   Hypertension    Pinched nerve in shoulder    Rheumatoid arthritis(714.0)    RUQ pain    Vulvar dystrophy    Yeast infection     Patient Active Problem List   Diagnosis Date Noted   Chronic prescription opiate use 10/31/2020   Atrial fibrillation with rapid ventricular response ([place]) 04/08/2020   Secondary hypercoagulable state ([place]) 02/19/2020   Chronic bronchitis ([place]) 02/16/2020   Physical deconditioning    ILD (interstitial lung disease) ([place]) 08/31/2019   Shortness of breath 08/31/2019   Vitamin D deficiency 09/18/2018   Chronic back pain greater than 3 months duration 09/06/2018   GERD without esophagitis 09/04/2018   Chronic vertigo 09/04/2018   Hypomagnesemia 09/04/2018    Rheumatoid arthritis ([place])    TIA (transient ischemic attack) 08/25/2018   CVA (cerebral vascular accident) Acute subcentimeter infarction in the right [place] 08/25/2018   Chronic anticoagulation/Eliquis for Afib 08/25/2018   Acute CVA (cerebrovascular accident) ([place]) 08/25/2018   Chronic bilateral low back pain without sciatica 09/02/2017   Neuralgia, post-herpetic 09/02/2017   Wedge compression fracture of first lumbar vertebra, initial encounter for closed fracture ([place]) 05/06/2017   CAP (community acquired pneumonia) 02/24/2017   Acute respiratory failure with hypoxia ([place]) 02/23/2017   Bronchitis 02/22/2017   Hypoxia 02/22/2017   Generalized weakness 02/22/2017   RAD (reactive airway disease) with wheezing, mild intermittent, with acute exacerbation 02/22/2017   Impingement syndrome of shoulder, left 02/17/2017   History of repair of rotator cuff 11/05/2016   Acute pain of left shoulder due to trauma 10/15/2016   Complete tear of left rotator cuff 10/15/2016   Cervical radiculopathy 09/24/2016   Shoulder pain, left 09/24/2016   Vulvar dystrophy 05/23/2014   Atrial fibrillation ([place]) 08/10/2013   Essential hypertension 07/04/2013   Tachycardia 01/31/2013   Dystrophy, vulva 05/04/2012   Abdominal pain, chronic, epigastric 03/28/2012   Dyspepsia 04/14/2011   History of colon cancer 03/17/2011   Diarrhea 03/17/2011   HLD (hyperlipidemia) 11/30/2006   BRONCHIECTASIS 11/30/2006   RHEUMATOID ARTHRITIS 11/30/2006    Past Surgical History:  Procedure Laterality Date   ABDOMINAL HYSTERECTOMY  09/13/1958  APPENDECTOMY  with hysterectomy   AV NODE ABLATION N/A 04/08/2020   Procedure: AV NODE ABLATION;  Surgeon: Evans Lance, MD;  Location: [place] [facility];  Service: Cardiovascular;  Laterality: N/A;   BACK SURGERY  12/08/2010   plate/screws C3 C4   CARDIOVERSION N/A 09/29/2019   Procedure: CARDIOVERSION;  Surgeon: Arnoldo Lenis, MD;  Location[facility];  Service: Endoscopy;  Laterality: N/A;  pt ate early this AM per Dr. Harl Bowie   CARDIOVERSION N/A 02/26/2020   Procedure: CARDIOVERSION;  Surgeon: Sueanne Margarita, MD;  Location: [facility] ENDOSCOPY;  Service: Cardiovascular;  Laterality: N/A;   CHOLECYSTECTOMY  01/13/2000   COLONOSCOPY  04/02/2009   COLONOSCOPY  06/04/2005   COLONOSCOPY N/A 07/26/2015   Rehman:examined portion of ileum normal, patent end to end ileocolonic anastomosis, characterized by healthy appearing mucosa, diverticulosis in sigmoid colon, exam otherwise normal, internal hemorrhoids, no specimens   COLONOSCOPY WITH ESOPHAGOGASTRODUODENOSCOPY (EGD) N/A 04/08/2012   Procedure: COLONOSCOPY WITH ESOPHAGOGASTRODUODENOSCOPY (EGD);  Surgeon: Rogene Houston, MD;  Location: [facility];  Service: Endoscopy;  Laterality: N/A;  130-moved to 245 Ann notified pt   ESOPHAGOGASTRODUODENOSCOPY N/A 07/26/2015   Rehman: normal esophagus, z line regular 40 cm from incisors, normal stomach, normal duodenal bulb and second portion of duodenum, no specimens.   HEMICOLECTOMY  10/13/2003   PACEMAKER IMPLANT N/A 04/08/2020   Procedure: PACEMAKER IMPLANT;  Surgeon: Evans Lance, MD;  Location: [place] [facility];  Service: Cardiovascular;  Laterality: N/A;     OB History     Gravida  4   Para  4   Term      Preterm      AB      Living  4      SAB      IAB      Ectopic      Multiple      Live Births  4           Family History  Problem Relation Age of Onset   Rheum arthritis Mother    Parkinsonism Father    Hiatal hernia Sister    Healthy Brother    Healthy Daughter    Healthy Son    Healthy Son     Social History   Tobacco Use   Smoking status: Never   Smokeless tobacco: Never  Vaping Use   Vaping Use: Never used  Substance Use Topics   Alcohol use: No   Drug use: No    Home Medications Prior to Admission medications   Medication Sig Start Date End Date Taking? Authorizing Provider  apixaban  (ELIQUIS) 2.5 MG TABS tablet Take 1 tablet (2.5 mg total) by mouth 2 (two) times daily. 09/08/18   Gerlene Fee, NP  Calcium Carbonate Antacid (TUMS PO) Take by mouth.    [provider]  famotidine (PEPCID) 20 MG tablet Take 1 tablet (20 mg total) by mouth at bedtime. Patient not taking: Reported on 11/01/2020 09/19/20   Gabriel Rung, NP  HYDROcodone-acetaminophen (NORCO) 7.5-325 MG tablet Take 1 tablet by mouth 3 (three) times daily as needed for moderate pain. 09/08/18   Gerlene Fee, NP  hyoscyamine (LEVSIN) 0.125 MG tablet Take 1 tablet (0.125 mg total) by mouth every 8 (eight) hours as needed (abdominal pain). 10/31/20   Harvel Quale, MD  leflunomide (ARAVA) 20 MG tablet Take 1 tablet (20 mg total) by mouth daily. 09/08/18   Gerlene Fee, NP  Magnesium  Oxide 400 MG CAPS Take 1 capsule (400 mg total) by mouth daily. 05/17/20   Strader, Fransisco Hertz, PA-C  metoprolol succinate (TOPROL XL) 25 MG 24 hr tablet Take 1 tablet (25 mg total) by mouth daily. 11/01/20   Strader, Fransisco Hertz, PA-C  Omega-3 Fatty Acids (FISH OIL) 1000 MG CAPS Take 1,000 mg by mouth daily.    [provider]  omeprazole (PRILOSEC) 40 MG capsule Take 1 capsule (40 mg total) by mouth 2 (two) times daily. 09/19/20   Carlan, Chelsea L, NP  rosuvastatin (CRESTOR) 20 MG tablet Take 1 tablet (20 mg total) by mouth daily at 6 PM. 09/08/18   Nyoka Cowden, Phylis Bougie, NP  sucralfate (CARAFATE) 1 GM/10ML suspension Take 10 mLs (1 g total) by mouth 4 (four) times daily -  with meals and at bedtime. Patient not taking: Reported on 11/01/2020 09/19/20 10/19/20  Gabriel Rung, NP  torsemide (DEMADEX) 20 MG tablet Take 20 mg by mouth daily.    [provider]    Allergies    Iodinated diagnostic agents, Nerve block tray, Nitrofuran derivatives, Triamcinolone acetonide, and Vibra-tab [doxycycline]  Review of Systems   Review of Systems  Constitutional:  Positive for appetite change and unexpected  weight change.  Respiratory:  Positive for cough.   Gastrointestinal:  Positive for abdominal pain and nausea. Negative for vomiting.  All other systems reviewed and are negative.  Physical Exam Updated Vital Signs BP (!) 155/62   Pulse 73   Temp 98.6 F (37 C) (Oral)   Resp (!) 24   SpO2 95%   Physical Exam Vitals and nursing note reviewed.  Constitutional:      Appearance: Normal appearance.     Comments: Ill-appearing, malnourished elderly female laying on bed in some distress, groaning in pain  HENT:     Head: Normocephalic and atraumatic.  Eyes:     General:        Right eye: No discharge.        Left eye: No discharge.  Cardiovascular:     Rate and Rhythm: Normal rate.     Heart sounds: No murmur heard.   No friction rub. No gallop.  Pulmonary:     Effort: Pulmonary effort is normal.     Breath sounds: Normal breath sounds.     Comments: Scattered rhonchi throughout, poor inspiratory effort, no focal consolidation noted.  No wheezing. Abdominal:     General: Bowel sounds are normal.     Comments: Diffusely tender to palpation, more focally tender in epigastric region.  No rebound, rigidity, guarding, no ecchymosis noted on abdominal wall.  Skin:    General: Skin is dry.     Capillary Refill: Capillary refill takes less than 2 seconds.     Coloration: Skin is pale.  Neurological:     Mental Status: She is alert and oriented to person, place, and time.  Psychiatric:        Behavior: Behavior normal.     Comments: Tearful, anxious    ED Results / Procedures / Treatments   Labs (all labs ordered are listed, but only abnormal results are displayed) Labs Reviewed  RESP PANEL BY RT-PCR (FLU A&B, COVID) ARPGX2 - Abnormal; Notable for the following components:      Result Value   SARS Coronavirus 2 by RT PCR POSITIVE (*)    All other components within normal limits  CBC WITH DIFFERENTIAL/PLATELET - Abnormal; Notable for the following components:   RBC 3.42 (*)  Hemoglobin 10.9 (*)    HCT 33.4 (*)    Platelets 141 (*)    Lymphs Abs 0.6 (*)    All other components within normal limits  COMPREHENSIVE METABOLIC PANEL - Abnormal; Notable for the following components:   Glucose, Bld 107 (*)    Calcium 7.8 (*)    Total Protein 6.0 (*)    Albumin 3.0 (*)    All other components within normal limits  URINALYSIS, ROUTINE W REFLEX MICROSCOPIC - Abnormal; Notable for the following components:   Protein, ur 30 (*)    All other components within normal limits  LIPASE, BLOOD    EKG   EKG Interpretation  Date/Time:  Friday November 08 2020 13:17:19 EDT Ventricular Rate:  74 PR Interval:  59 QRS Duration: 109 QT Interval:  508 QTC Calculation: 564 R Axis:   -42 Text Interpretation: Sinus or ectopic atrial rhythm Short PR interval Inferior infarct, old Anterior infarct, age indeterminate Prolonged QT interval Since last tracing QT has lengthened Otherwise no significant change Confirmed by Daleen Bo [phone]) on 11/08/2020 4:43:08 PM  Radiology CT ABDOMEN PELVIS WO CONTRAST  Result Date: 11/08/2020 CLINICAL DATA:  87 year old with acute nonlocalized abdominal pain. Nausea. Patient reports unable to eat for 2 weeks. EXAM: CT ABDOMEN AND PELVIS WITHOUT CONTRAST TECHNIQUE: Multidetector CT imaging of the abdomen and pelvis was performed following the standard protocol without IV contrast. COMPARISON:  None. FINDINGS: Lower chest: Breathing motion artifact with interstitial coarsening and subpleural reticulation. Trace right pleural thickening. There is bronchiolectasis in the medial right lower lobe. Pacemaker wires are partially included mitral annulus and coronary artery calcifications. Hepatobiliary: No focal lesion on this unenhanced exam. Occasional calcified granuloma. There is questionable capsular nodularity involving the left hepatic lobe. Cholecystectomy. Common bile duct measures 12 mm. No visualized choledocholithiasis. Pancreas: Fatty atrophy.  No ductal dilatation or inflammation. No evidence of pancreatic mass on this unenhanced exam. Spleen: Normal in size without focal abnormality. Adrenals/Urinary Tract: Mild adrenal thickening without dominant adrenal nodule. There is no hydronephrosis, renal calculi, or focal renal abnormality. No evidence of focal renal lesion on this unenhanced exam. Unremarkable urinary bladder Stomach/Bowel: Small posterior gastric cardia diverticulum. Suggestion of diffuse gastric wall thickening. Wall thickening about the greater curvature of 18 mm. Lack of gastric distension limits detailed assessment. No small bowel obstruction or inflammatory change. Administered enteric contrast is seen throughout the colon. Suspected right hemicolectomy with enteric anastomosis in the right upper quadrant. Distal colonic diverticulosis without diverticulitis. There may be circumferential rectal wall thickening, series 2, image 88. Vascular/Lymphatic: Advanced aortic atherosclerosis. No aortic aneurysm. No portal venous or mesenteric gas. No enlarged lymph nodes in the abdomen or pelvis. Reproductive: Status post hysterectomy. No adnexal masses. Other: No free air or ascites.  No abdominal wall hernia. Musculoskeletal: L1 and L2 compression fractures with vertebral augmentation. The bones are diffusely under mineralized. No acute osseous abnormalities are seen. IMPRESSION: 1. Suggestion of diffuse gastric wall thickening, including the greater curvature. This may represent gastritis or peptic ulcer disease, neoplastic involvement not excluded. Recommend correlation with endoscopy. 2. Possible circumferential rectal wall thickening, recommend correlation with physical exam. 3. Cholecystectomy with common bile duct dilatation measuring 12 mm, likely secondary to prior cholecystectomy. Recommend correlation with LFTs. 4. Colonic diverticulosis without diverticulitis. 5. Possible capsular nodularity involving the left hepatic lobe, can be seen  in the setting of early cirrhosis. Recommend correlation with cirrhosis risk factors. Aortic Atherosclerosis (ICD10-I70.0). Electronically Signed   By: Aurther Loft.D.  On: 11/08/2020 17:07   DG Chest 2 View  Result Date: 11/08/2020 CLINICAL DATA:  Cough, shortness of breath EXAM: CHEST - 2 VIEW COMPARISON:  04/09/2020 FINDINGS: The heart size and mediastinal contours are within normal limits. Left chest single lead pacer. Unchanged elevation of the right hemidiaphragm. Mild, diffuse bilateral interstitial pulmonary opacity. The visualized skeletal structures are unremarkable. IMPRESSION: Mild, diffuse bilateral interstitial pulmonary opacity, likely edema. No focal airspace opacity. Electronically Signed   By: Delanna Ahmadi M.D.   On: 11/08/2020 14:08    Procedures .Critical Care Performed by: Anselmo Pickler, PA-C Authorized by: Anselmo Pickler, PA-C   Critical care provider statement:    Critical care time (minutes):  32   Critical care was necessary to treat or prevent imminent or life-threatening deterioration of the following conditions:  Respiratory failure   Critical care was time spent personally by me on the following activities:  Development of treatment plan with patient or surrogate, ordering and performing treatments and interventions, ordering and review of laboratory studies, ordering and review of radiographic studies, pulse oximetry, examination of patient, evaluation of patient's response to treatment, re-evaluation of patient's condition, review of old charts and interpretation of cardiac output measurements   Care discussed with: admitting provider     Medications Ordered in ED Medications  ondansetron (ZOFRAN) injection 4 mg (4 mg Intravenous Given 11/08/20 1316)  morphine 4 MG/ML injection 2 mg (2 mg Intravenous Given 11/08/20 1316)  lactated ringers bolus 500 mL (500 mLs Intravenous New Bag/Given 11/08/20 1759)  ondansetron (ZOFRAN) injection 4 mg (4  mg Intravenous Given 11/08/20 1811)  morphine 4 MG/ML injection 2 mg (2 mg Intravenous Given 11/08/20 1820)    ED Course  I have reviewed the triage vital signs and the nursing notes.  Pertinent labs & imaging results that were available during my care of the patient were reviewed by me and considered in my medical decision making (see chart for details).    MDM Rules/Calculators/A&P                         I discussed this case with my attending physician who cosigned this note including patient's presenting symptoms, physical exam, and planned diagnostics and interventions. Attending physician stated agreement with plan or made changes to plan which were implemented.   Attending physician assessed patient at bedside.  Patient does notably have a history of colon cancer, with some colon resection status post several years ago, she does not have a colostomy.  She is a very ill-appearing, malnourished patient who is nauseous, in distress, with frequent oxygen desaturations especially when she speaks.  Patient reports that she has had 2 weeks of not being able to eat anything, general worsening.  Patient has a chronic cough and some issues with her lungs, had required oxygen at home but has not used it recently.  Basic lab work reveals mild anemia, thrombocytopenia, slightly decreased albumin consistent with protein calorie malnutrition.  Respiratory panel is significant for COVID.  Patient has received multiple vaccinations, however there is no clear timeline of when her symptoms developed, so we will hold off on antiviral infusion at this time.  Her chest x-ray shows some pulmonary edema, favor third spacing secondary to protein calorie malnutrition versus heart failure.  Initial EKG did show sinus rhythm, however patient is in and out of tachycardia, will obtain repeat EKG to assess for atrial fibrillation as patient has a documented history of atrial fibrillation.  Urinalysis shows some signs of  dehydration with ketones present, no evidence of urinary tract infection.  CT of the abdomen pelvis reveals some wall thickening in the stomach, rectum, as well as lesion on the liver all of which need to be correlated with her physical exam, risk factors.  Recommended follow-up with endoscopy, colonoscopy based on results of CT.  Patient is requiring 2 to 4 L of nasal cannula oxygen, and is still desaturating into the mid to low 80s anytime that she speaks.  Will consult for admission at this time for acute hypoxic respiratory failure with COVID 19, as well as protein calorie malnutrition, failure to thrive.  Spoke with hospitalist who agrees to admission at this time. Final Clinical Impression(s) / ED Diagnoses Final diagnoses:  COVID  Nausea    Rx / DC Orders ED Discharge Orders     None        Anselmo Pickler, PA-C 11/08/20 1831    Anselmo Pickler, PA-C 11/09/20 1643    Varney Biles, MD 11/10/20 [phone]

## 2020-11-08 NOTE — Progress Notes (Unsigned)
M

## 2020-11-08 NOTE — ED Triage Notes (Signed)
Nausea and unable to eat for 2 weeks

## 2020-11-08 NOTE — ED Notes (Signed)
Patient transported to X-ray 

## 2020-11-09 ENCOUNTER — Other Ambulatory Visit: Payer: Self-pay

## 2020-11-09 DIAGNOSIS — R1013 Epigastric pain: Secondary | ICD-10-CM | POA: Diagnosis not present

## 2020-11-09 DIAGNOSIS — U071 COVID-19: Secondary | ICD-10-CM | POA: Diagnosis not present

## 2020-11-09 DIAGNOSIS — I4821 Permanent atrial fibrillation: Secondary | ICD-10-CM | POA: Diagnosis not present

## 2020-11-09 DIAGNOSIS — I10 Essential (primary) hypertension: Secondary | ICD-10-CM

## 2020-11-09 DIAGNOSIS — J9601 Acute respiratory failure with hypoxia: Secondary | ICD-10-CM | POA: Diagnosis not present

## 2020-11-09 LAB — COMPREHENSIVE METABOLIC PANEL
ALT: 15 U/L (ref 0–44)
AST: 30 U/L (ref 15–41)
Albumin: 3 g/dL — ABNORMAL LOW (ref 3.5–5.0)
Alkaline Phosphatase: 57 U/L (ref 38–126)
Anion gap: 7 (ref 5–15)
BUN: 12 mg/dL (ref 8–23)
CO2: 29 mmol/L (ref 22–32)
Calcium: 7.5 mg/dL — ABNORMAL LOW (ref 8.9–10.3)
Chloride: 102 mmol/L (ref 98–111)
Creatinine, Ser: 0.64 mg/dL (ref 0.44–1.00)
GFR, Estimated: >60 mL/min (ref >60)
Glucose, Bld: 134 mg/dL — ABNORMAL HIGH (ref 70–99)
Potassium: 5 mmol/L (ref 3.5–5.1)
Sodium: 138 mmol/L (ref 135–145)
Total Bilirubin: 0.1 mg/dL — ABNORMAL LOW (ref 0.3–1.2)
Total Protein: 6.1 g/dL — ABNORMAL LOW (ref 6.5–8.1)

## 2020-11-09 LAB — CBC WITH DIFFERENTIAL/PLATELET
Abs Immature Granulocytes: 0.05 10*3/uL (ref 0.00–0.07)
Basophils Absolute: 0 10*3/uL (ref 0.0–0.1)
Basophils Relative: 1 %
Eosinophils Absolute: 0 10*3/uL (ref 0.0–0.5)
Eosinophils Relative: 0 %
HCT: 33.8 % — ABNORMAL LOW (ref 36.0–46.0)
Hemoglobin: 11 g/dL — ABNORMAL LOW (ref 12.0–15.0)
Immature Granulocytes: 1 %
Lymphocytes Relative: 14 %
Lymphs Abs: 0.6 10*3/uL — ABNORMAL LOW (ref 0.7–4.0)
MCH: 31.7 pg (ref 26.0–34.0)
MCHC: 32.5 g/dL (ref 30.0–36.0)
MCV: 97.4 fL (ref 80.0–100.0)
Monocytes Absolute: 0.1 10*3/uL (ref 0.1–1.0)
Monocytes Relative: 3 %
Neutro Abs: 3.4 10*3/uL (ref 1.7–7.7)
Neutrophils Relative %: 81 %
Platelets: 126 10*3/uL — ABNORMAL LOW (ref 150–400)
RBC: 3.47 MIL/uL — ABNORMAL LOW (ref 3.87–5.11)
RDW: 14.6 % (ref 11.5–15.5)
WBC: 4.3 10*3/uL (ref 4.0–10.5)
nRBC: 0 % (ref 0.0–0.2)

## 2020-11-09 LAB — FERRITIN: Ferritin: 124 ng/mL (ref 11–307)

## 2020-11-09 LAB — C-REACTIVE PROTEIN: CRP: 0.7 mg/dL (ref <1.0)

## 2020-11-09 LAB — BRAIN NATRIURETIC PEPTIDE: B Natriuretic Peptide: 271 pg/mL — ABNORMAL HIGH (ref 0.0–100.0)

## 2020-11-09 MED ORDER — ORAL CARE MOUTH RINSE
15.0000 mL | Freq: Two times a day (BID) | OROMUCOSAL | Status: DC
  Administered 2020-11-10 – 2020-11-13 (×6): 15 mL via OROMUCOSAL

## 2020-11-09 MED ORDER — HYOSCYAMINE SULFATE 0.125 MG SL SUBL: 0.1250 mg | SUBLINGUAL_TABLET | Freq: Four times a day (QID) | SUBLINGUAL | Status: DC | PRN

## 2020-11-09 MED ORDER — ACETAMINOPHEN 325 MG PO TABS: 650.0000 mg | ORAL_TABLET | Freq: Four times a day (QID) | ORAL | Status: DC | PRN

## 2020-11-09 MED ORDER — LOPERAMIDE HCL 2 MG PO CAPS
2.0000 mg | ORAL_CAPSULE | Freq: Four times a day (QID) | ORAL | Status: DC | PRN
  Administered 2020-11-09: 2 mg via ORAL
  Filled 2020-11-09: qty 1

## 2020-11-09 MED ORDER — ONDANSETRON HCL 4 MG/2ML IJ SOLN
4.0000 mg | Freq: Four times a day (QID) | INTRAMUSCULAR | Status: DC | PRN
  Administered 2020-11-12 – 2020-11-13 (×3): 4 mg via INTRAVENOUS
  Filled 2020-11-09 (×4): qty 2

## 2020-11-09 MED ORDER — LIDOCAINE VISCOUS HCL 2 % MT SOLN
15.0000 mL | OROMUCOSAL | Status: DC | PRN
  Administered 2020-11-09: 15 mL via OROMUCOSAL
  Filled 2020-11-09: qty 15

## 2020-11-09 MED ORDER — ALBUTEROL SULFATE HFA 108 (90 BASE) MCG/ACT IN AERS: 2.0000 | INHALATION_SPRAY | RESPIRATORY_TRACT | Status: DC | PRN

## 2020-11-09 MED ORDER — MAGNESIUM SULFATE 4 GM/100ML IV SOLN
4.0000 g | Freq: Once | INTRAVENOUS | Status: AC
  Administered 2020-11-09: 4 g via INTRAVENOUS
  Filled 2020-11-09: qty 100

## 2020-11-09 MED ORDER — ENSURE ENLIVE PO LIQD
237.0000 mL | Freq: Two times a day (BID) | ORAL | Status: DC
  Administered 2020-11-10 – 2020-11-12 (×4): 237 mL via ORAL

## 2020-11-09 NOTE — Plan of Care (Signed)

## 2020-11-09 NOTE — Plan of Care (Signed)
  Problem: Acute Rehab PT Goals(only PT should resolve) Goal: Pt Will Go Sit To Supine/Side Flowsheets (Taken 11/09/2020 1320) Pt will go Sit to Supine/Side: with minimal assist Goal: Patient Will Perform Sitting Balance Flowsheets (Taken 11/09/2020 1320) Patient will perform sitting balance:  with min guard assist  1-2 min  with bilateral UE support Goal: Patient Will Transfer Sit To/From Stand Flowsheets (Taken 11/09/2020 1320) Patient will transfer sit to/from stand: with moderate assist     1:21 PM, 11/09/20 Jerene Pitch, DPT Physical Therapy with [facility]  [phone] office

## 2020-11-09 NOTE — Progress Notes (Signed)
PROGRESS NOTE    [name]  YBO:[id number] DOB: 23-Jan-1933 DOA: 11/08/2020 PCP: Sharilyn Sites, MD    Brief Narrative:  87 year old female with a history of atrial fibrillation, rheumatoid arthritis, chronic low back pain, chronic abdominal pain followed by GI.  She comes to the emergency room with worsening nausea, abdominal pain, frequent stools.  She also reports shortness of breath and cough.  She was found to be COVID-positive.  She was noted to be hypoxic on room air with increased work of breathing.  Started on remdesivir, steroids and oxygen.   Assessment & Plan:   Active Problems:   Abdominal pain, chronic, epigastric   Essential hypertension   Atrial fibrillation (HCC)   Chronic anticoagulation/Eliquis for Afib   GERD without esophagitis   Acute respiratory failure (HCC)   Acute respiratory failure with hypoxia -Noted to have oxygen saturations around 82 to 85% on room air.  She is also quite short of breath in conversation. -She does have underlying interstitial lung disease, COPD and now has COVID-19 pneumonia. -Continue supportive measures and wean off oxygen as tolerated  COVID-19 pneumonia -Due to her shortness of breath, cough, multiple comorbidities and higher risk of decompensation, she has been started on remdesivir and steroids -Continue to follow inflammatory markers  Chronic abdominal pain/nausea -She is followed by GI -Plans were for outpatient MRI of abdomen -This will have to be done at [facility] since she has a pacemaker -I suspect this can wait until her respiratory status has stabilized -Continue supportive measures with pain management, PPI, antiemetics -We will likely need EGD for evidence of gastritis on imaging.  With this will likely have to be delayed several weeks until her respiratory status/COVID infection have stabilized  Atrial fibrillation -Anticoagulated with Eliquis -She is on metoprolol for rate control  Rheumatoid  arthritis -She is chronically on leflunomide which is currently on hold  Failure to thrive -Patient continues to have poor p.o. intake -We will get nutrition input regarding supplements -Use viscous lidocaine for mouth ulcers  Generalized weakness -Seen by physical therapy with recommendations for skilled facility placement   DVT prophylaxis: apixaban (ELIQUIS) tablet 2.5 mg Start: 11/08/20 2200 apixaban (ELIQUIS) tablet 2.5 mg  Code Status: DNR Family Communication: Discussed with patient's daughter at the bedside Disposition Plan: Status is: Inpatient  Remains inpatient appropriate because: Continued need for IV treatments with remdesivir, steroids and oxygen for COVID-pneumonia   Consultants:    Procedures:    Antimicrobials:      Subjective: Patient complains of nausea.  She does have a cough.  Feels she feels short of breath.  Becomes short of breath in conversation.  P.o. intake remains poor.  She does describe sores in her mouth.  Objective: Vitals:   11/09/20 1000 11/09/20 1201 11/09/20 1330 11/09/20 1614  BP: (!) 125/59  129/81 (!) 141/66  Pulse: 72 73 69 73  Resp: (!) 21 (!) 22 (!) 25 (!) 23  Temp:    98.5 F (36.9 C)  TempSrc:    Axillary  SpO2: 97% 97% 95% 97%  Weight:    47.9 kg  Height:    5\' 2"  (1.575 m)    Intake/Output Summary (Last 24 hours) at 11/09/2020 2004 Last data filed at 11/09/2020 1614 Gross per 24 hour  Intake 438.58 ml  Output --  Net 438.58 ml   Filed Weights   11/09/20 1614  Weight: 47.9 kg    Examination:  General exam: Appears calm and comfortable  Respiratory system: Scattered coarse  breath sounds respiratory effort normal. Cardiovascular system: S1 & S2 heard, RRR. No JVD, murmurs, rubs, gallops or clicks. No pedal edema. Gastrointestinal system: Abdomen is nondistended, soft and nontender. No organomegaly or masses felt. Normal bowel sounds heard. Central nervous system: Alert and oriented. No focal neurological  deficits. Extremities: Symmetric 5 x 5 power. Skin: No rashes, lesions or ulcers Psychiatry: Judgement and insight appear normal. Mood & affect appropriate.     Data Reviewed: I have personally reviewed following labs and imaging studies  CBC: Recent Labs  Lab 11/08/20 1300 11/09/20 0327  WBC 4.1 4.3  NEUTROABS 2.9 3.4  HGB 10.9* 11.0*  HCT 33.4* 33.8*  MCV 97.7 97.4  PLT 141* 706*   Basic Metabolic Panel: Recent Labs  Lab 11/08/20 1300 11/08/20 2117 11/09/20 0327  NA 139  --  138  K 3.6  --  5.0  CL 101  --  102  CO2 29  --  29  GLUCOSE 107*  --  134*  BUN 17  --  12  CREATININE 0.56  --  0.64  CALCIUM 7.8*  --  7.5*  MG  --  1.6*  --    GFR: Estimated Creatinine Clearance: 38.9 mL/min (by C-G formula based on SCr of 0.64 mg/dL). Liver Function Tests: Recent Labs  Lab 11/08/20 1300 11/09/20 0327  AST 29 30  ALT 14 15  ALKPHOS 55 57  BILITOT 0.3 0.1*  PROT 6.0* 6.1*  ALBUMIN 3.0* 3.0*   Recent Labs  Lab 11/08/20 1300  LIPASE 24   No results for input(s): AMMONIA in the last 168 hours. Coagulation Profile: No results for input(s): INR, PROTIME in the last 168 hours. Cardiac Enzymes: No results for input(s): CKTOTAL, CKMB, CKMBINDEX, TROPONINI in the last 168 hours. BNP (last 3 results) No results for input(s): PROBNP in the last 8760 hours. HbA1C: No results for input(s): HGBA1C in the last 72 hours. CBG: No results for input(s): GLUCAP in the last 168 hours. Lipid Profile: No results for input(s): CHOL, HDL, LDLCALC, TRIG, CHOLHDL, LDLDIRECT in the last 72 hours. Thyroid Function Tests: No results for input(s): TSH, T4TOTAL, FREET4, T3FREE, THYROIDAB in the last 72 hours. Anemia Panel: Recent Labs    11/09/20 0327  FERRITIN 124   Sepsis Labs: No results for input(s): PROCALCITON, LATICACIDVEN in the last 168 hours.  Recent Results (from the past 240 hour(s))  Resp Panel by RT-PCR (Flu A&B, Covid) Nasopharyngeal Swab     Status: Abnormal    Collection Time: 11/08/20  1:20 PM   Specimen: Nasopharyngeal Swab; Nasopharyngeal(NP) swabs in vial transport medium  Result Value Ref Range Status   SARS Coronavirus 2 by RT PCR POSITIVE (A) NEGATIVE Final    Comment: RESULT CALLED TO, READ BACK BY AND VERIFIED WITH: H CRAWFORD RN [id number] [id number] K FORSYTH (NOTE) SARS-CoV-2 target nucleic acids are DETECTED.  The SARS-CoV-2 RNA is generally detectable in upper respiratory specimens during the acute phase of infection. Positive results are indicative of the presence of the identified virus, but do not rule out bacterial infection or co-infection with other pathogens not detected by the test. Clinical correlation with patient history and other diagnostic information is necessary to determine patient infection status. The expected result is Negative.  Fact Sheet for Patients: EntrepreneurPulse.com.au  Fact Sheet for Healthcare Providers: IncredibleEmployment.be  This test is not yet approved or cleared by the [place] FDA and  has been authorized for detection and/or diagnosis of SARS-CoV-2 by FDA under an Emergency Use  Authorization (EUA).  This EUA will remain in effect (meaning this test can  be used) for the duration of  the COVID-19 declaration under Section 564(b)(1) of the Act, 21 U.S.C. section 360bbb-3(b)(1), unless the authorization is terminated or revoked sooner.     Influenza A by PCR NEGATIVE NEGATIVE Final   Influenza B by PCR NEGATIVE NEGATIVE Final    Comment: (NOTE) The Xpert Xpress SARS-CoV-2/FLU/RSV plus assay is intended as an aid in the diagnosis of influenza from Nasopharyngeal swab specimens and should not be used as a sole basis for treatment. Nasal washings and aspirates are unacceptable for Xpert Xpress SARS-CoV-2/FLU/RSV testing.  Fact Sheet for Patients: EntrepreneurPulse.com.au  Fact Sheet for Healthcare  Providers: IncredibleEmployment.be  This test is not yet approved or cleared by the [place] FDA and has been authorized for detection and/or diagnosis of SARS-CoV-2 by FDA under an Emergency Use Authorization (EUA). This EUA will remain in effect (meaning this test can be used) for the duration of the COVID-19 declaration under Section 564(b)(1) of the Act, 21 U.S.C. section 360bbb-3(b)(1), unless the authorization is terminated or revoked.  Performed at [facility], [address] [place] [address] [place], [place] [address]          Radiology Studies: CT ABDOMEN PELVIS WO CONTRAST  Result Date: 11/08/2020 CLINICAL DATA:  87 year old with acute nonlocalized abdominal pain. Nausea. Patient reports unable to eat for 2 weeks. EXAM: CT ABDOMEN AND PELVIS WITHOUT CONTRAST TECHNIQUE: Multidetector CT imaging of the abdomen and pelvis was performed following the standard protocol without IV contrast. COMPARISON:  None. FINDINGS: Lower chest: Breathing motion artifact with interstitial coarsening and subpleural reticulation. Trace right pleural thickening. There is bronchiolectasis in the medial right lower lobe. Pacemaker wires are partially included mitral annulus and coronary artery calcifications. Hepatobiliary: No focal lesion on this unenhanced exam. Occasional calcified granuloma. There is questionable capsular nodularity involving the left hepatic lobe. Cholecystectomy. Common bile duct measures 12 mm. No visualized choledocholithiasis. Pancreas: Fatty atrophy. No ductal dilatation or inflammation. No evidence of pancreatic mass on this unenhanced exam. Spleen: Normal in size without focal abnormality. Adrenals/Urinary Tract: Mild adrenal thickening without dominant adrenal nodule. There is no hydronephrosis, renal calculi, or focal renal abnormality. No evidence of focal renal lesion on this unenhanced exam. Unremarkable urinary bladder Stomach/Bowel: Small posterior gastric cardia  diverticulum. Suggestion of diffuse gastric wall thickening. Wall thickening about the greater curvature of 18 mm. Lack of gastric distension limits detailed assessment. No small bowel obstruction or inflammatory change. Administered enteric contrast is seen throughout the colon. Suspected right hemicolectomy with enteric anastomosis in the right upper quadrant. Distal colonic diverticulosis without diverticulitis. There may be circumferential rectal wall thickening, series 2, image 88. Vascular/Lymphatic: Advanced aortic atherosclerosis. No aortic aneurysm. No portal venous or mesenteric gas. No enlarged lymph nodes in the abdomen or pelvis. Reproductive: Status post hysterectomy. No adnexal masses. Other: No free air or ascites.  No abdominal wall hernia. Musculoskeletal: L1 and L2 compression fractures with vertebral augmentation. The bones are diffusely under mineralized. No acute osseous abnormalities are seen. IMPRESSION: 1. Suggestion of diffuse gastric wall thickening, including the greater curvature. This may represent gastritis or peptic ulcer disease, neoplastic involvement not excluded. Recommend correlation with endoscopy. 2. Possible circumferential rectal wall thickening, recommend correlation with physical exam. 3. Cholecystectomy with common bile duct dilatation measuring 12 mm, likely secondary to prior cholecystectomy. Recommend correlation with LFTs. 4. Colonic diverticulosis without diverticulitis. 5. Possible capsular nodularity involving the left hepatic lobe, can be seen in  the setting of early cirrhosis. Recommend correlation with cirrhosis risk factors. Aortic Atherosclerosis (ICD10-I70.0). Electronically Signed   By: Keith Rake M.D.   On: 11/08/2020 17:07   DG Chest 2 View  Result Date: 11/08/2020 CLINICAL DATA:  Cough, shortness of breath EXAM: CHEST - 2 VIEW COMPARISON:  04/09/2020 FINDINGS: The heart size and mediastinal contours are within normal limits. Left chest single  lead pacer. Unchanged elevation of the right hemidiaphragm. Mild, diffuse bilateral interstitial pulmonary opacity. The visualized skeletal structures are unremarkable. IMPRESSION: Mild, diffuse bilateral interstitial pulmonary opacity, likely edema. No focal airspace opacity. Electronically Signed   By: Delanna Ahmadi M.D.   On: 11/08/2020 14:08        Scheduled Meds:  apixaban  2.5 mg Oral BID   calcium carbonate  1 tablet Oral TID WC   [START ON 11/10/2020] feeding supplement  237 mL Oral BID BM   mouth rinse  15 mL Mouth Rinse BID   melatonin  6 mg Oral QHS   methylPREDNISolone (SOLU-MEDROL) injection  80 mg Intravenous Q12H   metoprolol succinate  25 mg Oral Daily   pantoprazole (PROTONIX) IV  40 mg Intravenous Q12H   rosuvastatin  20 mg Oral q1800   sucralfate  1 g Oral TID WC & HS   Continuous Infusions:  remdesivir 100 mg in NS 100 mL Stopped (11/09/20 1153)     LOS: 1 day    Time spent: 58mins    Kathie Dike, MD [facility]   If 7PM-7AM, please contact night-coverage [email or web address]  11/09/2020, 8:04 PM

## 2020-11-09 NOTE — Evaluation (Signed)
Physical Therapy Evaluation Patient Details Name: [name] MRN: [id number] DOB: 09/15/1933 Today's Date: 11/09/2020  History of Present Illness  [name]  is a 87 y.o. female, with past medical history relevant for chronic atrial fibrillation, HTN,, HLD, rheumatoid arthritis, CVA, L1/L2 fracture status post kyphoplasty, chronic lower back pain, A. fib, uncontrolled in the past requiring multiple DCCV's, requiring AV node ablation and PPM implant in 03/2020.  -Patient presents to ED secondary to multiple complaints, mainly failure to thrive, she does report chronic nausea, chronic abdominal pain, inability to keep food down, weight loss, very poor appetite, as well recently she does start endorsing some shortness of breath, cough as well, but with known history of interstitial lung disease, COPD, as well she has RA, but with no oxygen requirement currently.  - in ED was noted to be hypoxic, in the low 80s on room air, chest x-ray was significant for multifocal opacities, she is with known chronic interstitial lung disease, her COVID-19 was positive, ET abdomen pelvis with significant abnormalities which appears to be more chronic , he had hospitalist consulted to admit the patient given her COVID-19 positive status and new oxygen requirement.   Clinical Impression    Patient sitting up in bed at start of session with daughter present and agreeable to therapy. Patient very tired with poor trunk control. Able to perform bed mobilities with moderate assist but unable to sit edge of bed without physical assist by PT. During bed mobilities it was observed that the patient had a bowel movement. Nursing notified and patient able to roll left and right with min assist by PT for changing of linens. Required +2 assist to transfer up in bed secondary to weakness. Patient wheelchair bound at baseline but was able to transfer to and from her chair independently. Family trying to set up schedule for them to  stay with her 24/7 but unsure at this time if they will be able to do that.  Patient will continue to benefit from skilled physical therapy in hospital and recommended venue below to increase strength, balance, endurance for safe ADLs and gait.    Recommendations for follow up therapy are one component of a multi-disciplinary discharge planning process, led by the attending physician.  Recommendations may be updated based on patient status, additional functional criteria and insurance authorization.  Follow Up Recommendations Skilled nursing-short term rehab (<3 hours/day)    Assistance Recommended at Discharge Frequent or constant Supervision/Assistance  Functional Status Assessment    Equipment Recommendations  None recommended by PT    Recommendations for Other Services       Precautions / Restrictions Precautions Precautions: Fall Restrictions Weight Bearing Restrictions: No      Mobility  Bed Mobility Overal bed mobility: Needs Assistance Bed Mobility: Rolling;Sidelying to Sit;Supine to Sit;Sit to Supine;Sit to Sidelying Rolling: Min assist (verbal cues) Sidelying to sit: Mod assist Supine to sit: Mod assist Sit to supine: Mod assist Sit to sidelying: Min assist General bed mobility comments: slow labored movements, poor trunk and head control, needs assist sitting edge of bed  +2 Total assist to transfer up in bed  Transfers                   General transfer comment: unable    Ambulation/Gait                Stairs            Wheelchair Mobility    Modified Rankin (Stroke Patients  Only)       Balance Overall balance assessment: Needs assistance   Sitting balance-Leahy Scale: Zero Sitting balance - Comments: requires trunk support to maintain balance, poor head and trunk control                                     Pertinent Vitals/Pain Pain Assessment: No/denies pain    Home Living Family/patient expects to be  discharged to:: Private residence Living Arrangements: Alone Available Help at Discharge: Family;Friend(s) Type of Home: House Home Access: Stairs to enter Entrance Stairs-Rails: Right (going up) Entrance Stairs-Number of Steps: 3   Home Layout: One level Home Equipment: Wheelchair - manual;BSC (rollator)      Prior Function Prior Level of Function : Needs assist       Physical Assist : Mobility (physical);ADLs (physical) Mobility (physical): Gait ADLs (physical): IADLs Mobility Comments: uses WC at home, doesn't walk, able to transfer to and from bed/WC/BSC ADLs Comments: home bound, family assists as needed     Hand Dominance        Extremity/Trunk Assessment        Lower Extremity Assessment Lower Extremity Assessment: Generalized weakness    Cervical / Trunk Assessment Cervical / Trunk Assessment: Kyphotic  Communication   Communication: No difficulties  Cognition Arousal/Alertness: Lethargic Behavior During Therapy: WFL for tasks assessed/performed                                            General Comments      Exercises General Exercises - Lower Extremity Ankle Circles/Pumps: 10 reps;Both;Strengthening;Supine Heel Slides: 5 reps;Both;Supine   Assessment/Plan    PT Assessment Patient needs continued PT services  PT Problem List Decreased strength;Decreased range of motion;Decreased activity tolerance;Decreased balance;Decreased mobility;Decreased knowledge of use of DME       PT Treatment Interventions Therapeutic activities;Therapeutic exercise;Balance training;Neuromuscular re-education;Patient/family education    PT Goals (Current goals can be found in the Care Plan section)  Acute Rehab PT Goals Patient Stated Goal: to go home PT Goal Formulation: With patient/family Time For Goal Achievement: 11/23/20 Potential to Achieve Goals: Fair    Frequency Min 3X/week   Barriers to discharge Decreased caregiver  support patietn has children who are willing to care for her but are unable to stay with her 24/7 at this time    Co-evaluation               AM-PAC PT "6 Clicks" Mobility  Outcome Measure Help needed turning from your back to your side while in a flat bed without using bedrails?: A Little Help needed moving from lying on your back to sitting on the side of a flat bed without using bedrails?: A Lot Help needed moving to and from a bed to a chair (including a wheelchair)?: Total Help needed standing up from a chair using your arms (e.g., wheelchair or bedside chair)?: Total Help needed to walk in hospital room?: Total Help needed climbing 3-5 steps with a railing? : Total 6 Click Score: 9    End of Session Equipment Utilized During Treatment: Oxygen Activity Tolerance: Patient limited by lethargy Patient left: in bed;with call bell/phone within reach;with family/visitor present Nurse Communication: Mobility status PT Visit Diagnosis: Muscle weakness (generalized) (M62.81);Unsteadiness on feet (R26.81)    Time: 8250-0370 PT Time Calculation (  min) (ACUTE ONLY): 26 min   Charges:   PT Evaluation $PT Eval Moderate Complexity: 1 Mod PT Treatments $Therapeutic Activity: 8-22 mins       1:21 PM, 11/09/20 Jerene Pitch, DPT Physical Therapy with [facility]  [phone] office

## 2020-11-10 DIAGNOSIS — R1013 Epigastric pain: Secondary | ICD-10-CM | POA: Diagnosis not present

## 2020-11-10 DIAGNOSIS — J9601 Acute respiratory failure with hypoxia: Secondary | ICD-10-CM | POA: Diagnosis not present

## 2020-11-10 DIAGNOSIS — U071 COVID-19: Secondary | ICD-10-CM | POA: Diagnosis not present

## 2020-11-10 DIAGNOSIS — I4821 Permanent atrial fibrillation: Secondary | ICD-10-CM | POA: Diagnosis not present

## 2020-11-10 LAB — CBC WITH DIFFERENTIAL/PLATELET
Abs Immature Granulocytes: 0.06 10*3/uL (ref 0.00–0.07)
Basophils Absolute: 0 10*3/uL (ref 0.0–0.1)
Basophils Relative: 0 %
Eosinophils Absolute: 0 10*3/uL (ref 0.0–0.5)
Eosinophils Relative: 0 %
HCT: 31.5 % — ABNORMAL LOW (ref 36.0–46.0)
Hemoglobin: 10.2 g/dL — ABNORMAL LOW (ref 12.0–15.0)
Immature Granulocytes: 1 %
Lymphocytes Relative: 7 %
Lymphs Abs: 0.6 10*3/uL — ABNORMAL LOW (ref 0.7–4.0)
MCH: 31.1 pg (ref 26.0–34.0)
MCHC: 32.4 g/dL (ref 30.0–36.0)
MCV: 96 fL (ref 80.0–100.0)
Monocytes Absolute: 0.4 10*3/uL (ref 0.1–1.0)
Monocytes Relative: 5 %
Neutro Abs: 7.2 10*3/uL (ref 1.7–7.7)
Neutrophils Relative %: 87 %
Platelets: 133 10*3/uL — ABNORMAL LOW (ref 150–400)
RBC: 3.28 MIL/uL — ABNORMAL LOW (ref 3.87–5.11)
RDW: 14.7 % (ref 11.5–15.5)
WBC: 8.2 10*3/uL (ref 4.0–10.5)
nRBC: 0 % (ref 0.0–0.2)

## 2020-11-10 LAB — COMPREHENSIVE METABOLIC PANEL
ALT: 13 U/L (ref 0–44)
AST: 24 U/L (ref 15–41)
Albumin: 2.8 g/dL — ABNORMAL LOW (ref 3.5–5.0)
Alkaline Phosphatase: 50 U/L (ref 38–126)
Anion gap: 8 (ref 5–15)
BUN: 24 mg/dL — ABNORMAL HIGH (ref 8–23)
CO2: 27 mmol/L (ref 22–32)
Calcium: 7.4 mg/dL — ABNORMAL LOW (ref 8.9–10.3)
Chloride: 103 mmol/L (ref 98–111)
Creatinine, Ser: 0.67 mg/dL (ref 0.44–1.00)
GFR, Estimated: >60 mL/min (ref >60)
Glucose, Bld: 168 mg/dL — ABNORMAL HIGH (ref 70–99)
Potassium: 4.7 mmol/L (ref 3.5–5.1)
Sodium: 138 mmol/L (ref 135–145)
Total Bilirubin: <0.1 mg/dL — ABNORMAL LOW (ref 0.3–1.2)
Total Protein: 5.6 g/dL — ABNORMAL LOW (ref 6.5–8.1)

## 2020-11-10 LAB — C-REACTIVE PROTEIN: CRP: 0.6 mg/dL (ref <1.0)

## 2020-11-10 LAB — FERRITIN: Ferritin: 140 ng/mL (ref 11–307)

## 2020-11-10 MED ORDER — ADULT MULTIVITAMIN W/MINERALS CH
1.0000 | ORAL_TABLET | Freq: Every day | ORAL | Status: DC
  Administered 2020-11-11 – 2020-11-12 (×2): 1 via ORAL
  Filled 2020-11-10 (×4): qty 1

## 2020-11-10 MED ORDER — LACTATED RINGERS IV SOLN: INTRAVENOUS | Status: DC

## 2020-11-10 NOTE — Clinical Social Work Note (Signed)
CSW contacted patient's son to inquire about PT's recommendation for SNF. Patient's son reported that he is agreeable to [facility] but would like to confer with his siblings first before agreeing to SNF. CSW started FL2. TOC to follow.

## 2020-11-10 NOTE — Progress Notes (Signed)
Initial Nutrition Assessment  DOCUMENTATION CODES:   Not applicable  INTERVENTION:   -MVI with minerals daily -Continue Ensure Enlive po BID, each supplement provides 350 kcal and 20 grams of protein  -Hormel Shake BID, each supplement provides 520 kcals and 22 grams protein  NUTRITION DIAGNOSIS:   Increased nutrient needs related to acute illness (COVID-19) as evidenced by estimated needs.  GOAL:   Patient will meet greater than or equal to 90% of their needs  MONITOR:   PO intake, Supplement acceptance, Diet advancement, Weight trends, Labs, Skin, I & O's  REASON FOR ASSESSMENT:   Malnutrition Screening Tool, Consult Assessment of nutrition requirement/status, Enteral/tube feeding initiation and management  ASSESSMENT:   87 year old female with a history of atrial fibrillation, rheumatoid arthritis, chronic low back pain, chronic abdominal pain followed by GI.  She comes to the emergency room with worsening nausea, abdominal pain, frequent stools.  She also reports shortness of breath and cough.  She was found to be COVID-positive.  She was noted to be hypoxic on room air with increased work of breathing.  Started on remdesivir, steroids and oxygen.  Pt admitted with acute respiratory failure and COVID-19 pneumonia.   Reviewed I/O's: +189 ml x 24 hors and +939 ml since admission  Pt unavailable at time of visit. Attempted to speak with pt via call to hospital room phone, however, unable to reach. RD unable to obtain further nutrition-related history or complete nutrition-focused physical exam at this time.    Per MD notes, pt with poor oral intake secondary to mouth ulcers. Noted variable acceptance of Ensure supplements. MD requesting recommendations for supplements. No plan for enteral support at this time.   Reviewed wt hx; noted wt gain over the past 3 months.   Palliative care consult pending for goals of care discussions.   Medications reviewed and include calcium  carbonate, solu-medrol, and remdesivir.   Labs reviewed.   Diet Order:   Diet Order             DIET - DYS 1 Room service appropriate? Yes; Fluid consistency: Thin  Diet effective now                   EDUCATION NEEDS:   No education needs have been identified at this time  Skin:  Skin Assessment: Reviewed RN Assessment  Last BM:  11/09/20  Height:   Ht Readings from Last 1 Encounters:  11/10/20 5\' 2"  (1.575 m)    Weight:   Wt Readings from Last 1 Encounters:  11/10/20 52.6 kg    Ideal Body Weight:  50 kg  BMI:  Body mass index is 21.21 kg/m.  Estimated Nutritional Needs:   Kcal:  1650-1850  Protein:  80-95 grams  Fluid:  > 1.6 L    Loistine Chance, RD, LDN, [place] Registered Dietitian II Certified Diabetes Care and Education Specialist Please refer to [facility] for RD and/or RD on-call/weekend/after hours pager

## 2020-11-10 NOTE — Progress Notes (Signed)
This LPN assisted NT is drying pt. And assisted with bed placement. No other needs expressed at this time. Call bell in reach. With bed alarm on and placed in lowest position.

## 2020-11-10 NOTE — Progress Notes (Signed)
PROGRESS NOTE    [name]  QQV:[id number] DOB: April 15, 1933 DOA: 11/08/2020 PCP: Sharilyn Sites, MD    Brief Narrative:  87 year old female with a history of atrial fibrillation, rheumatoid arthritis, chronic low back pain, chronic abdominal pain followed by GI.  She comes to the emergency room with worsening nausea, abdominal pain, frequent stools.  She also reports shortness of breath and cough.  She was found to be COVID-positive.  She was noted to be hypoxic on room air with increased work of breathing.  Started on remdesivir, steroids and oxygen.   Assessment & Plan:   Active Problems:   Abdominal pain, chronic, epigastric   Essential hypertension   Atrial fibrillation (HCC)   Chronic anticoagulation/Eliquis for Afib   GERD without esophagitis   Acute respiratory failure (HCC)   Acute respiratory failure with hypoxia -Noted to have oxygen saturations around 82 to 85% on room air.  She is also quite short of breath in conversation. -She does have underlying interstitial lung disease, COPD and now has COVID-19 pneumonia. -Continue supportive measures and wean off oxygen as tolerated  COVID-19 pneumonia -Due to her shortness of breath, cough, multiple comorbidities and higher risk of decompensation, she has been started on remdesivir and steroids -Inflammatory markers were not significantly elevated, but clinically she is very short of breath and has significant secretions.  Chronic abdominal pain/nausea -She is followed by GI -Plans were for outpatient MRI of abdomen -This will have to be done at [facility] since she has a pacemaker -I suspect this can wait until her respiratory status has stabilized -Continue supportive measures with pain management, PPI, antiemetics -We will likely need EGD for evidence of gastritis on imaging.  With this will likely have to be delayed several weeks until her respiratory status/COVID infection have stabilized  Atrial  fibrillation -Anticoagulated with Eliquis -She is on metoprolol for rate control  Rheumatoid arthritis -She is chronically on leflunomide which is currently on hold  Failure to thrive -Patient continues to have poor p.o. intake -We will get nutrition input regarding supplements -Use viscous lidocaine for mouth ulcers  Generalized weakness -Seen by physical therapy with recommendations for skilled facility placement  Goals of care -Had an honest discussion with the patient's son regarding her multiple comorbidities, current illness and overall poor prognosis -Jeani Hawking was understanding and agreed that at many levels, family understands that she is very sick/weak and would have a difficult time recovering -Plan is to continue current treatments and monitor for improvement of her overall function -If she fails to make meaningful improvements in the next couple of days, it may be reasonable to have further discussions around quality of life/palliative care   DVT prophylaxis: apixaban (ELIQUIS) tablet 2.5 mg Start: 11/08/20 2200 apixaban (ELIQUIS) tablet 2.5 mg  Code Status: DNR Family Communication: Discussed with patient's son Jeani Hawking, at the bedside Disposition Plan: Status is: Inpatient  Remains inpatient appropriate because: Continued need for IV treatments with remdesivir, steroids and oxygen for COVID-pneumonia   Consultants:    Procedures:    Antimicrobials:      Subjective: Continues to have shortness of breath.  Cough is nonproductive.  She is able to take some food and by mouth, but has had nausea with it.  No diarrhea since yesterday.  She feels that the viscous lidocaine is helping her mouth soreness.  Family was concerned that she may have had some slurring of her speech this morning.  Upon my evaluation, speech appears to be at baseline.  She  did not have any other new focal deficits.  Objective: Vitals:   11/10/20 0551 11/10/20 0949 11/10/20 1147 11/10/20 1321  BP:  (!) 128/55 (!) 136/53 (!) 141/61 (!) 142/71  Pulse: 73 73 70 70  Resp: 17  20 20   Temp: 98.4 F (36.9 C)  (!) 97.1 F (36.2 C) (!) 97.5 F (36.4 C)  TempSrc:   Oral Oral  SpO2: 96%  98% 96%  Weight: 52.6 kg     Height: 5\' 2"  (1.575 m)       Intake/Output Summary (Last 24 hours) at 11/10/2020 2052 Last data filed at 11/10/2020 1742 Gross per 24 hour  Intake 452.27 ml  Output 150 ml  Net 302.27 ml   Filed Weights   11/09/20 1614 11/10/20 0551  Weight: 47.9 kg 52.6 kg    Examination:  General exam: Alert, awake, oriented x 3 Respiratory system: Coarse breath sounds bilaterally.  Increased respiratory effort Cardiovascular system:RRR. No murmurs, rubs, gallops. Gastrointestinal system: Abdomen is nondistended, soft and nontender. No organomegaly or masses felt. Normal bowel sounds heard. Central nervous system: Alert and oriented. No focal neurological deficits. Extremities: No C/C/E, +pedal pulses Skin: No rashes, lesions or ulcers Psychiatry: Judgement and insight appear normal. Mood & affect appropriate.      Data Reviewed: I have personally reviewed following labs and imaging studies  CBC: Recent Labs  Lab 11/08/20 1300 11/09/20 0327 11/10/20 0520  WBC 4.1 4.3 8.2  NEUTROABS 2.9 3.4 7.2  HGB 10.9* 11.0* 10.2*  HCT 33.4* 33.8* 31.5*  MCV 97.7 97.4 96.0  PLT 141* 126* 448*   Basic Metabolic Panel: Recent Labs  Lab 11/08/20 1300 11/08/20 2117 11/09/20 0327 11/10/20 0520  NA 139  --  138 138  K 3.6  --  5.0 4.7  CL 101  --  102 103  CO2 29  --  29 27  GLUCOSE 107*  --  134* 168*  BUN 17  --  12 24*  CREATININE 0.56  --  0.64 0.67  CALCIUM 7.8*  --  7.5* 7.4*  MG  --  1.6*  --   --    GFR: Estimated Creatinine Clearance: 40.7 mL/min (by C-G formula based on SCr of 0.67 mg/dL). Liver Function Tests: Recent Labs  Lab 11/08/20 1300 11/09/20 0327 11/10/20 0520  AST 29 30 24   ALT 14 15 13   ALKPHOS 55 57 50  BILITOT 0.3 0.1* <0.1*  PROT 6.0*  6.1* 5.6*  ALBUMIN 3.0* 3.0* 2.8*   Recent Labs  Lab 11/08/20 1300  LIPASE 24   No results for input(s): AMMONIA in the last 168 hours. Coagulation Profile: No results for input(s): INR, PROTIME in the last 168 hours. Cardiac Enzymes: No results for input(s): CKTOTAL, CKMB, CKMBINDEX, TROPONINI in the last 168 hours. BNP (last 3 results) No results for input(s): PROBNP in the last 8760 hours. HbA1C: No results for input(s): HGBA1C in the last 72 hours. CBG: No results for input(s): GLUCAP in the last 168 hours. Lipid Profile: No results for input(s): CHOL, HDL, LDLCALC, TRIG, CHOLHDL, LDLDIRECT in the last 72 hours. Thyroid Function Tests: No results for input(s): TSH, T4TOTAL, FREET4, T3FREE, THYROIDAB in the last 72 hours. Anemia Panel: Recent Labs    11/09/20 0327 11/10/20 0520  FERRITIN 124 140   Sepsis Labs: No results for input(s): PROCALCITON, LATICACIDVEN in the last 168 hours.  Recent Results (from the past 240 hour(s))  Resp Panel by RT-PCR (Flu A&B, Covid) Nasopharyngeal Swab     Status:  Abnormal   Collection Time: 11/08/20  1:20 PM   Specimen: Nasopharyngeal Swab; Nasopharyngeal(NP) swabs in vial transport medium  Result Value Ref Range Status   SARS Coronavirus 2 by RT PCR POSITIVE (A) NEGATIVE Final    Comment: RESULT CALLED TO, READ BACK BY AND VERIFIED WITH: H CRAWFORD RN [id number] K FORSYTH (NOTE) SARS-CoV-2 target nucleic acids are DETECTED.  The SARS-CoV-2 RNA is generally detectable in upper respiratory specimens during the acute phase of infection. Positive results are indicative of the presence of the identified virus, but do not rule out bacterial infection or co-infection with other pathogens not detected by the test. Clinical correlation with patient history and other diagnostic information is necessary to determine patient infection status. The expected result is Negative.  Fact Sheet for  Patients: EntrepreneurPulse.com.au  Fact Sheet for Healthcare Providers: IncredibleEmployment.be  This test is not yet approved or cleared by the [place] FDA and  has been authorized for detection and/or diagnosis of SARS-CoV-2 by FDA under an Emergency Use Authorization (EUA).  This EUA will remain in effect (meaning this test can  be used) for the duration of  the COVID-19 declaration under Section 564(b)(1) of the Act, 21 U.S.C. section 360bbb-3(b)(1), unless the authorization is terminated or revoked sooner.     Influenza A by PCR NEGATIVE NEGATIVE Final   Influenza B by PCR NEGATIVE NEGATIVE Final    Comment: (NOTE) The Xpert Xpress SARS-CoV-2/FLU/RSV plus assay is intended as an aid in the diagnosis of influenza from Nasopharyngeal swab specimens and should not be used as a sole basis for treatment. Nasal washings and aspirates are unacceptable for Xpert Xpress SARS-CoV-2/FLU/RSV testing.  Fact Sheet for Patients: EntrepreneurPulse.com.au  Fact Sheet for Healthcare Providers: IncredibleEmployment.be  This test is not yet approved or cleared by the [place] FDA and has been authorized for detection and/or diagnosis of SARS-CoV-2 by FDA under an Emergency Use Authorization (EUA). This EUA will remain in effect (meaning this test can be used) for the duration of the COVID-19 declaration under Section 564(b)(1) of the Act, 21 U.S.C. section 360bbb-3(b)(1), unless the authorization is terminated or revoked.  Performed at [facility], [address] [place], [place] [address]          Radiology Studies: No results found.      Scheduled Meds:  apixaban  2.5 mg Oral BID   calcium carbonate  1 tablet Oral TID WC   feeding supplement  237 mL Oral BID BM   mouth rinse  15 mL Mouth Rinse BID   melatonin  6 mg Oral QHS   methylPREDNISolone (SOLU-MEDROL) injection  80 mg Intravenous  Q12H   metoprolol succinate  25 mg Oral Daily   multivitamin with minerals  1 tablet Oral Daily   pantoprazole (PROTONIX) IV  40 mg Intravenous Q12H   rosuvastatin  20 mg Oral q1800   sucralfate  1 g Oral TID WC & HS   Continuous Infusions:  lactated ringers 75 mL/hr at 11/10/20 1742   remdesivir 100 mg in NS 100 mL Stopped (11/10/20 1029)     LOS: 2 days    Time spent: 55mins    Kathie Dike, MD [facility]   If 7PM-7AM, please contact night-coverage [email or web address]  11/10/2020, 8:52 PM

## 2020-11-11 ENCOUNTER — Other Ambulatory Visit (INDEPENDENT_AMBULATORY_CARE_PROVIDER_SITE_OTHER): Payer: Self-pay

## 2020-11-11 ENCOUNTER — Encounter (HOSPITAL_COMMUNITY): Payer: Self-pay | Admitting: Internal Medicine

## 2020-11-11 DIAGNOSIS — I480 Paroxysmal atrial fibrillation: Secondary | ICD-10-CM | POA: Diagnosis not present

## 2020-11-11 DIAGNOSIS — J9601 Acute respiratory failure with hypoxia: Secondary | ICD-10-CM | POA: Diagnosis not present

## 2020-11-11 DIAGNOSIS — I4821 Permanent atrial fibrillation: Secondary | ICD-10-CM | POA: Diagnosis not present

## 2020-11-11 DIAGNOSIS — G8929 Other chronic pain: Secondary | ICD-10-CM

## 2020-11-11 DIAGNOSIS — Z515 Encounter for palliative care: Secondary | ICD-10-CM | POA: Diagnosis not present

## 2020-11-11 DIAGNOSIS — U071 COVID-19: Secondary | ICD-10-CM | POA: Diagnosis not present

## 2020-11-11 DIAGNOSIS — E782 Mixed hyperlipidemia: Secondary | ICD-10-CM | POA: Diagnosis not present

## 2020-11-11 DIAGNOSIS — Z7189 Other specified counseling: Secondary | ICD-10-CM

## 2020-11-11 DIAGNOSIS — R1013 Epigastric pain: Secondary | ICD-10-CM | POA: Diagnosis not present

## 2020-11-11 LAB — CBC WITH DIFFERENTIAL/PLATELET
Abs Immature Granulocytes: 0.14 10*3/uL — ABNORMAL HIGH (ref 0.00–0.07)
Basophils Absolute: 0 10*3/uL (ref 0.0–0.1)
Basophils Relative: 0 %
Eosinophils Absolute: 0 10*3/uL (ref 0.0–0.5)
Eosinophils Relative: 0 %
HCT: 31.3 % — ABNORMAL LOW (ref 36.0–46.0)
Hemoglobin: 10.1 g/dL — ABNORMAL LOW (ref 12.0–15.0)
Immature Granulocytes: 1 %
Lymphocytes Relative: 5 %
Lymphs Abs: 0.5 10*3/uL — ABNORMAL LOW (ref 0.7–4.0)
MCH: 31.7 pg (ref 26.0–34.0)
MCHC: 32.3 g/dL (ref 30.0–36.0)
MCV: 98.1 fL (ref 80.0–100.0)
Monocytes Absolute: 0.5 10*3/uL (ref 0.1–1.0)
Monocytes Relative: 5 %
Neutro Abs: 8.7 10*3/uL — ABNORMAL HIGH (ref 1.7–7.7)
Neutrophils Relative %: 89 %
Platelets: 133 10*3/uL — ABNORMAL LOW (ref 150–400)
RBC: 3.19 MIL/uL — ABNORMAL LOW (ref 3.87–5.11)
RDW: 14.9 % (ref 11.5–15.5)
WBC: 9.8 10*3/uL (ref 4.0–10.5)
nRBC: 0 % (ref 0.0–0.2)

## 2020-11-11 LAB — COMPREHENSIVE METABOLIC PANEL
ALT: 14 U/L (ref 0–44)
AST: 26 U/L (ref 15–41)
Albumin: 2.7 g/dL — ABNORMAL LOW (ref 3.5–5.0)
Alkaline Phosphatase: 48 U/L (ref 38–126)
Anion gap: 9 (ref 5–15)
BUN: 26 mg/dL — ABNORMAL HIGH (ref 8–23)
CO2: 27 mmol/L (ref 22–32)
Calcium: 7.3 mg/dL — ABNORMAL LOW (ref 8.9–10.3)
Chloride: 103 mmol/L (ref 98–111)
Creatinine, Ser: 0.57 mg/dL (ref 0.44–1.00)
GFR, Estimated: >60 mL/min (ref >60)
Glucose, Bld: 160 mg/dL — ABNORMAL HIGH (ref 70–99)
Potassium: 4.5 mmol/L (ref 3.5–5.1)
Sodium: 139 mmol/L (ref 135–145)
Total Bilirubin: 0.4 mg/dL (ref 0.3–1.2)
Total Protein: 5.6 g/dL — ABNORMAL LOW (ref 6.5–8.1)

## 2020-11-11 LAB — C-REACTIVE PROTEIN: CRP: 0.6 mg/dL (ref <1.0)

## 2020-11-11 LAB — FERRITIN: Ferritin: 131 ng/mL (ref 11–307)

## 2020-11-11 MED ORDER — IPRATROPIUM-ALBUTEROL 20-100 MCG/ACT IN AERS
1.0000 | INHALATION_SPRAY | Freq: Four times a day (QID) | RESPIRATORY_TRACT | Status: DC
  Administered 2020-11-12 (×2): 1 via RESPIRATORY_TRACT
  Filled 2020-11-11: qty 4

## 2020-11-11 MED ORDER — HYDROCODONE BIT-HOMATROP MBR 5-1.5 MG/5ML PO SOLN
5.0000 mL | ORAL | Status: DC | PRN
  Administered 2020-11-11 – 2020-11-12 (×4): 5 mL via ORAL
  Filled 2020-11-11 (×4): qty 5

## 2020-11-11 MED ORDER — ALBUTEROL SULFATE HFA 108 (90 BASE) MCG/ACT IN AERS: 2.0000 | INHALATION_SPRAY | RESPIRATORY_TRACT | Status: DC | PRN

## 2020-11-11 NOTE — Consult Note (Signed)
Consultation Note Date: 11/11/2020   Patient Name: [name]  DOB: Mar 16, 1933  MRN: [id number]  Age / Sex: 87 y.o., female  PCP: Cassandra Sites, MD Referring Physician: Kathie Dike, MD  Reason for Consultation: Establishing goals of care  HPI/Patient Profile: 87 y.o. female  with past medical history of chronic A. fib uncontrolled in the past requiring multiple DCCV's, requiring AV node ablation and PPM implant 03/2020, chronic bronchitis with COPD, history of colon cancer, HTN/HLD, RA, CVA, L1/L2 fracture status post kyphoplasty, chronic low back pain, chronic abdomen pain/nausea with GI following, admitted on 11/08/2020 with acute respiratory failure with hypoxia/COVID, chronic abdominal pain/nausea, failure to thrive.   Clinical Assessment and Goals of Care: I have reviewed medical records including EPIC notes, labs and imaging, received report from RN, assessed the patient and then met at the bedside along with sons, [name] and [name], to discuss diagnosis prognosis, GOC, EOL wishes, disposition and options.  I introduced Palliative Medicine as specialized medical care for people living with serious illness. It focuses on providing relief from the symptoms and stress of a serious illness. The goal is to improve quality of life for both the patient and the family.  [name] tells me that the family had been investigating outpatient palliative services with hospice of [facility].  We discussed a brief life review of the patient.  [name] has been living independently.  She has been experiencing abdomen pain and nausea, following with GI, but continues to experience issues and weight loss.  She and her family endorse that she is likely needing more help, and it is unlikely that she would be able to return home to live independently.  We then focused on their current illness.  We talked about time  for outcomes, a few more days to see how she is able to recover from [place].  I encouraged her to do the best she can.  We talked about the possibility of short-term rehab when she is improved enough to leave the hospital.  At this point [name] would like to attempt short-term rehab.  The natural disease trajectory and expectations at EOL were discussed.  I attempted to elicit values and goals of care important to the patient.  We talked about her chronic illness burden, her poor functional status, her frailty.  I shared that some people who look like her need to live in a nursing home.  I share that although people adjust, some people shared that they would rather pass away then to live in a nursing home.  [name] responds, "that might be me".  Again, we talked about time for outcomes.    Hospice and Palliative Care services outpatient were explained and offered.  We talked about outpatient palliative services for "treat the treatable" care, with a nurse practitioner visiting the home once per month.  We also talked about at home hospice care for "treat the treatable" care with an aide 3 times a week, registered nurse every week or 2.  We also talked  about residential hospice, to let nature take its course.  The difference between aggressive medical intervention and comfort care was considered in light of the patient's goals of care.  I encourage [name] and her family to give time for outcomes.  Son [name] shares that her faith stating that they will pray on these choices.  Advanced directives, concepts specific to code status, artifical feeding and hydration, and rehospitalization were considered and discussed.  At this point DNR.  Discussed the importance of continued conversation with family and the medical providers regarding overall plan of care and treatment options, ensuring decisions are within the context of the patient's values and GOCs.  Questions and concerns were addressed.    The patient and family was encouraged to call with questions or concerns.  PMT will continue to support holistically.  Conference with attending, bedside nursing staff, transition of care team related to patient condition, needs, goals of care, disposition.  HCPOA   NEXT OF KIN -[name] tells me that she has 3 sons and a daughter.  She shares that she would want her children to make choices for her as a team.    SUMMARY OF RECOMMENDATIONS   At this point continue to treat the treatable but no CPR or intubation Time for outcomes. At this point agreeable to short-term rehab and outpatient palliative    Code Status/Advance Care Planning: DNR  Symptom Management:  Per hospitalist, no additional needs at this time.  Palliative Prophylaxis:  Oral Care and Turn Reposition  Additional Recommendations (Limitations, Scope, Preferences): Treat the treatable but no CPR or intubation  Psycho-social/Spiritual:  Desire for further Chaplaincy support:no Additional Recommendations: Caregiving  Support/Resources and Education on Hospice  Prognosis:  Unable to determine, based on outcomes.  Guarded at this point.  The next days to weeks will indicate [name]'s ability to have meaningful recovery.  Discharge Planning:  To be determined, based on outcomes.  At this point agreeable to short-term rehab.       Primary Diagnoses: Present on Admission:  Acute respiratory failure (HCC)  Abdominal pain, chronic, epigastric  Atrial fibrillation (HCC)  Essential hypertension  GERD without esophagitis   I have reviewed the medical record, interviewed the patient and family, and examined the patient. The following aspects are pertinent.  Past Medical History:  Diagnosis Date   A-fib [facility])    Atrial fibrillation (HCC)    Cervicalgia    Chronic bronchitis with COPD (chronic obstructive pulmonary disease) (HCC)    Colon cancer (HCC)    Degeneration of cervical intervertebral disc     History of multiple strokes 2021   2 strokes within same day   Hypertension    Pinched nerve in shoulder    Rheumatoid arthritis(714.0)    RUQ pain    Vulvar dystrophy    Yeast infection    Social History   Socioeconomic History   Marital status: Married    Spouse name: Not on file   Number of children: Not on file   Years of education: Not on file   Highest education level: Not on file  Occupational History   Not on file  Tobacco Use   Smoking status: Never   Smokeless tobacco: Never  Vaping Use   Vaping Use: Never used  Substance and Sexual Activity   Alcohol use: No   Drug use: No   Sexual activity: Never  Other Topics Concern   Not on file  Social History Narrative   Nonsmoker.  She lives  alone.  Her son calls her every day to check on her.  Has a daughter involved in care as well.     Social Determinants of Health   Financial Resource Strain: Not on file  Food Insecurity: Not on file  Transportation Needs: Not on file  Physical Activity: Not on file  Stress: Not on file  Social Connections: Not on file   Family History  Problem Relation Age of Onset   Rheum arthritis Mother    Parkinsonism Father    Hiatal hernia Sister    Healthy Brother    Healthy Daughter    Healthy Son    Healthy Son    Scheduled Meds:  apixaban  2.5 mg Oral BID   calcium carbonate  1 tablet Oral TID WC   feeding supplement  237 mL Oral BID BM   mouth rinse  15 mL Mouth Rinse BID   melatonin  6 mg Oral QHS   methylPREDNISolone (SOLU-MEDROL) injection  80 mg Intravenous Q12H   metoprolol succinate  25 mg Oral Daily   multivitamin with minerals  1 tablet Oral Daily   pantoprazole (PROTONIX) IV  40 mg Intravenous Q12H   rosuvastatin  20 mg Oral q1800   sucralfate  1 g Oral TID WC & HS   Continuous Infusions:  lactated ringers 75 mL/hr at 11/10/20 1742   remdesivir 100 mg in NS 100 mL 100 mg (11/11/20 0841)   PRN Meds:.acetaminophen, albuterol, chlorpheniramine-HYDROcodone,  guaiFENesin-dextromethorphan, hyoscyamine, lidocaine, loperamide, ondansetron Medications Prior to Admission:  Prior to Admission medications   Medication Sig Start Date End Date Taking? Authorizing Provider  acetaminophen (TYLENOL) 500 MG tablet Take 500 mg by mouth 2 (two) times daily.   Yes [provider]  apixaban (ELIQUIS) 2.5 MG TABS tablet Take 1 tablet (2.5 mg total) by mouth 2 (two) times daily. 09/08/18  Yes Gerlene Fee, NP  HYDROcodone-acetaminophen (NORCO) 7.5-325 MG tablet Take 1 tablet by mouth 3 (three) times daily as needed for moderate pain. Patient taking differently: Take 1 tablet by mouth 2 (two) times daily as needed for moderate pain. 1030 and at 0200 09/08/18  Yes Gerlene Fee, NP  leflunomide (ARAVA) 20 MG tablet Take 1 tablet (20 mg total) by mouth daily. 09/08/18  Yes Gerlene Fee, NP  Magnesium Oxide 400 MG CAPS Take 1 capsule (400 mg total) by mouth daily. 05/17/20  Yes Strader, [place] M, PA-C  metoprolol succinate (TOPROL XL) 25 MG 24 hr tablet Take 1 tablet (25 mg total) by mouth daily. 11/01/20  Yes Strader, [place] M, PA-C  Omega-3 Fatty Acids (FISH OIL) 1000 MG CAPS Take 1,000 mg by mouth daily.   Yes [provider]  omeprazole (PRILOSEC) 40 MG capsule Take 1 capsule (40 mg total) by mouth 2 (two) times daily. 09/19/20  Yes Carlan, Chelsea L, NP  rosuvastatin (CRESTOR) 20 MG tablet Take 1 tablet (20 mg total) by mouth daily at 6 PM. 09/08/18  Yes Gerlene Fee, NP  torsemide (DEMADEX) 20 MG tablet Take 20 mg by mouth daily.   Yes [provider]  Calcium Carbonate Antacid (TUMS PO) Take by mouth. Patient not taking: No sig reported    [provider]  famotidine (PEPCID) 20 MG tablet Take 1 tablet (20 mg total) by mouth at bedtime. Patient not taking: No sig reported 09/19/20   Carlan, Chelsea L, NP  hyoscyamine (LEVSIN) 0.125 MG tablet Take 1 tablet (0.125 mg total) by mouth every 8 (eight) hours as  needed (abdominal  pain). Patient not taking: No sig reported 10/31/20   Montez Morita, Quillian Quince, MD  sucralfate (CARAFATE) 1 GM/10ML suspension Take 10 mLs (1 g total) by mouth 4 (four) times daily -  with meals and at bedtime. Patient not taking: Reported on 11/01/2020 09/19/20 10/19/20  Gabriel Rung, NP   Allergies  Allergen Reactions   Iodinated Diagnostic Agents Anaphylaxis    08-12-10 Pt had severe reaction with itching/hives/loss of consciousness within 5 minutes of CEPI today.  She received both Kenalog and Iondinated Contrast, so not certain which agent caused reaction.  If patient returns for another CEPI, Dr. Jobe Igo considers using Decadron and a 13-hour prep.  jkl   Nerve Block Tray Anaphylaxis    Patient states that she was sent to have Epidural for her shoulder. After rec'ving she remembers saying that she was itching all over  And that's it. She was transported to hospital  By EMS. Stayed overnight.   Nitrofuran Derivatives Hives and Nausea And Vomiting   Triamcinolone Acetonide Anaphylaxis    08-12-10  Pt had severe reaction with itching/hives/loss of consciousness within five minutes of CEPI today.  She received both Kenalog and Iodinated Contrast, so not certain which agent caused reaction.  If pt returns for another cervical epidural steroid injection, Dr. Jobe Igo suggests using Decadron and a 13-hr prep.  JKL PATIENT USES PROCTOCREAM 2.5% WITHOUT DIFFICULTY FOR VULVAR DYSTROPHY Patient says she can take prednisone without any problem   Vibra-Tab [Doxycycline] Nausea And Vomiting   Review of Systems  Unable to perform ROS: Age   Physical Exam Vitals and nursing note reviewed.  Constitutional:      General: She is not in acute distress.    Appearance: She is ill-appearing.  HENT:     Head: Atraumatic.     Mouth/Throat:     Mouth: Mucous membranes are dry.  Cardiovascular:     Rate and Rhythm: Normal rate.  Pulmonary:     Effort: Pulmonary effort is normal.     Comments: Work of  breathing noted at times Musculoskeletal:        General: No swelling.  Skin:    General: Skin is warm and dry.     Coloration: Skin is pale.  Neurological:     Mental Status: She is alert and oriented to person, place, and time.  Psychiatric:        Mood and Affect: Mood normal.        Behavior: Behavior normal.    Vital Signs: BP (!) 157/72 (BP Location: Left Arm)   Pulse 84   Temp 98.6 F (37 C) (Oral)   Resp 20   Ht $R'5\' 2"'nj$  (1.575 m)   Wt 53.2 kg   SpO2 94%   BMI 21.45 kg/m  Pain Scale: 0-10   Pain Score: 0-No pain   SpO2: SpO2: 94 % O2 Device:SpO2: 94 % O2 Flow Rate: .O2 Flow Rate (L/min): 2 L/min  IO: Intake/output summary:  Intake/Output Summary (Last 24 hours) at 11/11/2020 1340 Last data filed at 11/11/2020 0900 Gross per 24 hour  Intake 1173.18 ml  Output 150 ml  Net 1023.18 ml    LBM: Last BM Date: 11/11/20 Baseline Weight: Weight: 47.9 kg Most recent weight: Weight: 53.2 kg     Palliative Assessment/Data:   Flowsheet Rows    Flowsheet Row Most Recent Value  Intake Tab   Referral Department Hospitalist  Unit at Time of Referral Cardiac/Telemetry Unit  Palliative Care Primary Diagnosis Pulmonary  Date Notified 11/08/20  Palliative Care Type New Palliative care  Reason for referral Clarify Goals of Care  Date of Admission 11/08/20  Date first seen by Palliative Care 11/11/20  # of days Palliative referral response time 3 Day(s)  # of days IP prior to Palliative referral 0  Clinical Assessment   Palliative Performance Scale Score 40%  Pain Max last 24 hours Not able to report  Pain Min Last 24 hours Not able to report  Dyspnea Max Last 24 Hours Not able to report  Dyspnea Min Last 24 hours Not able to report  Psychosocial & Spiritual Assessment   Palliative Care Outcomes        Time In: 1040 Time Out: 1150 Time Total: 70 minutes  Greater than 50%  of this time was spent counseling and coordinating care related to the above assessment  and plan.  Signed by: Drue Novel, NP   Please contact Palliative Medicine Team phone at [phone] for questions and concerns.  For individual provider: See Shea Evans

## 2020-11-11 NOTE — Plan of Care (Signed)

## 2020-11-11 NOTE — Progress Notes (Signed)
PROGRESS NOTE    [name]  STM:[id number] DOB: [birth year] DOA: 11/08/2020 PCP: Sharilyn Sites, MD    Brief Narrative:  87 year old female with a history of atrial fibrillation, rheumatoid arthritis, chronic low back pain, chronic abdominal pain followed by GI.  She comes to the emergency room with worsening nausea, abdominal pain, frequent stools.  She also reports shortness of breath and cough.  She was found to be COVID-positive.  She was noted to be hypoxic on room air with increased work of breathing.  Started on remdesivir, steroids and oxygen.   Assessment & Plan:   Active Problems:   Abdominal pain, chronic, epigastric   Essential hypertension   Atrial fibrillation (HCC)   Chronic anticoagulation/Eliquis for Afib   GERD without esophagitis   Acute respiratory failure (HCC)   Acute respiratory failure with hypoxia -Noted to have oxygen saturations around 82 to 85% on room air.  She is also quite short of breath in conversation. -She does have underlying interstitial lung disease, COPD and now has COVID-19 pneumonia. -Continue supportive measures and wean off oxygen as tolerated  COVID-19 pneumonia -Due to her shortness of breath, cough, multiple comorbidities and higher risk of decompensation, she has been started on remdesivir and steroids -Inflammatory markers were not significantly elevated, but clinically she is very short of breath and has significant secretions. -Continue current treatments and pulmonary hygiene  Chronic abdominal pain/nausea -She is followed by GI -Plans were for outpatient MRI of abdomen -This will have to be done at [facility] since she has a pacemaker -I suspect this can wait until her respiratory status has stabilized -Continue supportive measures with pain management, PPI, antiemetics -Will likely need EGD for evidence of gastritis on imaging.  This will likely have to be delayed several weeks until her respiratory status/COVID  infection have stabilized  Atrial fibrillation -Anticoagulated with Eliquis -She is on metoprolol for rate control  Rheumatoid arthritis -She is chronically on leflunomide which is currently on hold  Failure to thrive -Patient continues to have poor p.o. intake -We will get nutrition input regarding supplements -Use viscous lidocaine for mouth ulcers  Generalized weakness -Seen by physical therapy with recommendations for skilled facility placement  Goals of care -Had an honest discussion with the patient's son regarding her multiple comorbidities, current illness and overall poor prognosis -Jeani Hawking was understanding and agreed that at many levels, family understands that she is very sick/weak and would have a difficult time recovering -Plan is to continue current treatments and monitor for improvement of her overall function -If she fails to make meaningful improvements in the next couple of days, it may be reasonable to have further discussions around quality of life/palliative care   DVT prophylaxis: apixaban (ELIQUIS) tablet 2.5 mg Start: 11/08/20 2200 apixaban (ELIQUIS) tablet 2.5 mg  Code Status: DNR Family Communication: Discussed with patient's son and daughter at the bedside Disposition Plan: Status is: Inpatient  Remains inpatient appropriate because: Continued need for IV treatments with remdesivir, steroids and oxygen for COVID-pneumonia   Consultants:  Palliative care  Procedures:    Antimicrobials:      Subjective: She feels like she has more energy today.  Continues to have cough with difficult to expectorate sputum.  Feels that oral intake may be improving.  She is still very weak  Objective: Vitals:   11/10/20 2107 11/11/20 0449 11/11/20 0500 11/11/20 1238  BP: (!) 118/55 (!) 136/59  (!) 157/72  Pulse: 70 71  84  Resp: 19 19  20  Temp: 98.3 F (36.8 C) 98 F (36.7 C)  98.6 F (37 C)  TempSrc:    Oral  SpO2: 93% 90%  94%  Weight:   53.2 kg    Height:        Intake/Output Summary (Last 24 hours) at 11/11/2020 1928 Last data filed at 11/11/2020 1700 Gross per 24 hour  Intake 2230.3 ml  Output --  Net 2230.3 ml   Filed Weights   11/09/20 1614 11/10/20 0551 11/11/20 0500  Weight: 47.9 kg 52.6 kg 53.2 kg    Examination:  General exam: Alert, awake, oriented x 3 Respiratory system: Coarse breath sounds bilaterally.  Increased respiratory effort Cardiovascular system:RRR. No murmurs, rubs, gallops. Gastrointestinal system: Abdomen is nondistended, soft and nontender. No organomegaly or masses felt. Normal bowel sounds heard. Central nervous system: Alert and oriented. No focal neurological deficits. Extremities: No C/C/E, +pedal pulses Skin: No rashes, lesions or ulcers Psychiatry: Judgement and insight appear normal. Mood & affect appropriate.      Data Reviewed: I have personally reviewed following labs and imaging studies  CBC: Recent Labs  Lab 11/08/20 1300 11/09/20 0327 11/10/20 0520 11/11/20 0533  WBC 4.1 4.3 8.2 9.8  NEUTROABS 2.9 3.4 7.2 8.7*  HGB 10.9* 11.0* 10.2* 10.1*  HCT 33.4* 33.8* 31.5* 31.3*  MCV 97.7 97.4 96.0 98.1  PLT 141* 126* 133* 008*   Basic Metabolic Panel: Recent Labs  Lab 11/08/20 1300 11/08/20 2117 11/09/20 0327 11/10/20 0520 11/11/20 0533  NA 139  --  138 138 139  K 3.6  --  5.0 4.7 4.5  CL 101  --  102 103 103  CO2 29  --  29 27 27   GLUCOSE 107*  --  134* 168* 160*  BUN 17  --  12 24* 26*  CREATININE 0.56  --  0.64 0.67 0.57  CALCIUM 7.8*  --  7.5* 7.4* 7.3*  MG  --  1.6*  --   --   --    GFR: Estimated Creatinine Clearance: 40.7 mL/min (by C-G formula based on SCr of 0.57 mg/dL). Liver Function Tests: Recent Labs  Lab 11/08/20 1300 11/09/20 0327 11/10/20 0520 11/11/20 0533  AST 29 30 24 26   ALT 14 15 13 14   ALKPHOS 55 57 50 48  BILITOT 0.3 0.1* <0.1* 0.4  PROT 6.0* 6.1* 5.6* 5.6*  ALBUMIN 3.0* 3.0* 2.8* 2.7*   Recent Labs  Lab 11/08/20 1300  LIPASE  24   No results for input(s): AMMONIA in the last 168 hours. Coagulation Profile: No results for input(s): INR, PROTIME in the last 168 hours. Cardiac Enzymes: No results for input(s): CKTOTAL, CKMB, CKMBINDEX, TROPONINI in the last 168 hours. BNP (last 3 results) No results for input(s): PROBNP in the last 8760 hours. HbA1C: No results for input(s): HGBA1C in the last 72 hours. CBG: No results for input(s): GLUCAP in the last 168 hours. Lipid Profile: No results for input(s): CHOL, HDL, LDLCALC, TRIG, CHOLHDL, LDLDIRECT in the last 72 hours. Thyroid Function Tests: No results for input(s): TSH, T4TOTAL, FREET4, T3FREE, THYROIDAB in the last 72 hours. Anemia Panel: Recent Labs    11/10/20 0520 11/11/20 0533  FERRITIN 140 131   Sepsis Labs: No results for input(s): PROCALCITON, LATICACIDVEN in the last 168 hours.  Recent Results (from the past 240 hour(s))  Resp Panel by RT-PCR (Flu A&B, Covid) Nasopharyngeal Swab     Status: Abnormal   Collection Time: 11/08/20  1:20 PM   Specimen: Nasopharyngeal Swab; Nasopharyngeal(NP) swabs in  vial transport medium  Result Value Ref Range Status   SARS Coronavirus 2 by RT PCR POSITIVE (A) NEGATIVE Final    Comment: RESULT CALLED TO, READ BACK BY AND VERIFIED WITH: H CRAWFORD RN [id number] K FORSYTH (NOTE) SARS-CoV-2 target nucleic acids are DETECTED.  The SARS-CoV-2 RNA is generally detectable in upper respiratory specimens during the acute phase of infection. Positive results are indicative of the presence of the identified virus, but do not rule out bacterial infection or co-infection with other pathogens not detected by the test. Clinical correlation with patient history and other diagnostic information is necessary to determine patient infection status. The expected result is Negative.  Fact Sheet for Patients: EntrepreneurPulse.com.au  Fact Sheet for Healthcare  Providers: IncredibleEmployment.be  This test is not yet approved or cleared by the [place] FDA and  has been authorized for detection and/or diagnosis of SARS-CoV-2 by FDA under an Emergency Use Authorization (EUA).  This EUA will remain in effect (meaning this test can  be used) for the duration of  the COVID-19 declaration under Section 564(b)(1) of the Act, 21 U.S.C. section 360bbb-3(b)(1), unless the authorization is terminated or revoked sooner.     Influenza A by PCR NEGATIVE NEGATIVE Final   Influenza B by PCR NEGATIVE NEGATIVE Final    Comment: (NOTE) The Xpert Xpress SARS-CoV-2/FLU/RSV plus assay is intended as an aid in the diagnosis of influenza from Nasopharyngeal swab specimens and should not be used as a sole basis for treatment. Nasal washings and aspirates are unacceptable for Xpert Xpress SARS-CoV-2/FLU/RSV testing.  Fact Sheet for Patients: EntrepreneurPulse.com.au  Fact Sheet for Healthcare Providers: IncredibleEmployment.be  This test is not yet approved or cleared by the [place] FDA and has been authorized for detection and/or diagnosis of SARS-CoV-2 by FDA under an Emergency Use Authorization (EUA). This EUA will remain in effect (meaning this test can be used) for the duration of the COVID-19 declaration under Section 564(b)(1) of the Act, 21 U.S.C. section 360bbb-3(b)(1), unless the authorization is terminated or revoked.  Performed at [facility], [address], [place], [place] [address]          Radiology Studies: No results found.      Scheduled Meds:  apixaban  2.5 mg Oral BID   calcium carbonate  1 tablet Oral TID WC   feeding supplement  237 mL Oral BID BM   Ipratropium-Albuterol  1 puff Inhalation Q6H   mouth rinse  15 mL Mouth Rinse BID   melatonin  6 mg Oral QHS   methylPREDNISolone (SOLU-MEDROL) injection  80 mg Intravenous Q12H   metoprolol succinate  25 mg  Oral Daily   multivitamin with minerals  1 tablet Oral Daily   pantoprazole (PROTONIX) IV  40 mg Intravenous Q12H   rosuvastatin  20 mg Oral q1800   sucralfate  1 g Oral TID WC & HS   Continuous Infusions:  lactated ringers 75 mL/hr at 11/11/20 1513   remdesivir 100 mg in NS 100 mL Stopped (11/11/20 0928)     LOS: 3 days    Time spent: 51mins    Kathie Dike, MD [facility]   If 7PM-7AM, please contact night-coverage [email or web address]  11/11/2020, 7:28 PM

## 2020-11-11 NOTE — Evaluation (Signed)
Occupational Therapy Evaluation Patient Details Name: [name] MRN: [id number] DOB: 1933/03/08 Today's Date: 11/11/2020   History of Present Illness [name]  is a 87 y.o. female, with past medical history relevant for chronic atrial fibrillation, HTN,, HLD, rheumatoid arthritis, CVA, L1/L2 fracture status post kyphoplasty, chronic lower back pain, A. fib, uncontrolled in the past requiring multiple DCCV's, requiring AV node ablation and PPM implant in 03/2020.  -Patient presents to ED secondary to multiple complaints, mainly failure to thrive, she does report chronic nausea, chronic abdominal pain, inability to keep food down, weight loss, very poor appetite, as well recently she does start endorsing some shortness of breath, cough as well, but with known history of interstitial lung disease, COPD, as well she has RA, but with no oxygen requirement currently.  - in ED was noted to be hypoxic, in the low 80s on room air, chest x-ray was significant for multifocal opacities, she is with known chronic interstitial lung disease, her COVID-19 was positive, ET abdomen pelvis with significant abnormalities which appears to be more chronic , he had hospitalist consulted to admit the patient given her COVID-19 positive status and new oxygen requirement.   Clinical Impression   Pt agreeable to OT evaluation. Pt demonstrates fatigue and lethargy but able to engage in therapy tasks. Pt taken off O2 during session and able to remain at 90% or above SpO2 until returned to O2 at end of session. Pt fatigues quickly requiring supervision to min G assist for bed mobility and min A for sit to stand and  min to mod A for walking forward and and backward with RW. Pt is able to don socks seated at EOB with supervision assist. Pt will benefit from continued OT in the hospital and recommended venue below to increase strength, balance, and endurance for safe ADL's.        Recommendations for follow up therapy  are one component of a multi-disciplinary discharge planning process, led by the attending physician.  Recommendations may be updated based on patient status, additional functional criteria and insurance authorization.   Follow Up Recommendations  Skilled nursing-short term rehab (<3 hours/day)    Assistance Recommended at Discharge Intermittent Supervision/Assistance  Functional Status Assessment  Patient has had a recent decline in their functional status and demonstrates the ability to make significant improvements in function in a reasonable and predictable amount of time.  Equipment Recommendations  None recommended by OT    Recommendations for Other Services       Precautions / Restrictions Precautions Precautions: Fall Restrictions Weight Bearing Restrictions: No      Mobility Bed Mobility Overal bed mobility: Needs Assistance Bed Mobility: Supine to Sit;Sit to Supine   Sidelying to sit: Min guard;Supervision   Sit to supine: Supervision;Min guard   General bed mobility comments: slow labored movement, pt fatigues quickly    Transfers Overall transfer level: Needs assistance Equipment used: Rolling walker (2 wheels) Transfers: Sit to/from Stand Sit to Stand: Min guard;Min assist           General transfer comment: Pt able to stand with use of RW with Min A.      Balance Overall balance assessment: Needs assistance Sitting-balance support: No upper extremity supported;Feet supported Sitting balance-Leahy Scale: Poor Sitting balance - Comments: poor to fair seated at EOB Postural control: Left lateral lean Standing balance support: Bilateral upper extremity supported;During functional activity;Reliant on assistive device for balance Standing balance-Leahy Scale: Poor Standing balance comment: using RW\  ADL either performed or assessed with clinical judgement   ADL Overall ADL's : Needs assistance/impaired                      Lower Body Dressing: Supervision/safety;Min guard;Sitting/lateral leans Lower Body Dressing Details (indicate cue type and reason): Pt able to doff and don socks seated at EOB. Toilet Transfer: Minimal assistance;Moderate assistance;Stand-pivot;Rolling walker (2 wheels) Toilet Transfer Details (indicate cue type and reason): Partially simulated with sit to stand and ambulation forward and backward with min A using RW.         Functional mobility during ADLs: Minimal assistance;Moderate assistance;Rollator (4 wheels) General ADL Comments: Pt able to step forward and backward but fatigued quickly with labored movement.     Vision Baseline Vision/History: 2 Legally blind Ability to See in Adequate Light: 3 Highly impaired Patient Visual Report: No change from baseline Vision Assessment?: No apparent visual deficits (Other than baseline deficits.)     Perception     Praxis      Pertinent Vitals/Pain Pain Assessment: No/denies pain     Hand Dominance Left   Extremity/Trunk Assessment Upper Extremity Assessment Upper Extremity Assessment: LUE deficits/detail;Generalized weakness LUE Deficits / Details: Previous shoulder injury. Limited to 2+/5 shoulder flexion MMT. Generally weake below shoulder.   Lower Extremity Assessment Lower Extremity Assessment: Defer to PT evaluation   Cervical / Trunk Assessment Cervical / Trunk Assessment: Kyphotic   Communication Communication Communication: HOH   Cognition Arousal/Alertness: Lethargic Behavior During Therapy: WFL for tasks assessed/performed Overall Cognitive Status: Within Functional Limits for tasks assessed                                       General Comments       Exercises     Shoulder Instructions      Home Living Family/patient expects to be discharged to:: Private residence Living Arrangements: Alone Available Help at Discharge: Family;Friend(s) Type of Home: House Home  Access: Stairs to enter [employer] of Steps: 3 Entrance Stairs-Rails: Right (going up) Home Layout: One level     Bathroom Shower/Tub: Teacher, early years/pre: Standard     Home Equipment: Wheelchair - manual;BSC          Prior Functioning/Environment Prior Level of Function : Needs assist       Physical Assist : Mobility (physical);ADLs (physical) Mobility (physical): Gait ADLs (physical): IADLs Mobility Comments: uses WC at home, doesn't walk, able to transfer to and from bed/WC/BSC ADLs Comments: home bound, family assists as needed; takes sponge baths with assist as needed.        OT Problem List: Decreased strength;Decreased range of motion;Decreased activity tolerance;Impaired balance (sitting and/or standing);Impaired vision/perception;Decreased coordination      OT Treatment/Interventions: Self-care/ADL training;Therapeutic exercise;Patient/family education;Balance training;Therapeutic activities;DME and/or AE instruction;Visual/perceptual remediation/compensation    OT Goals(Current goals can be found in the care plan section) Acute Rehab OT Goals Patient Stated Goal: Attend rehab prior to return home. OT Goal Formulation: With patient Time For Goal Achievement: 11/25/20 Potential to Achieve Goals: Fair  OT Frequency: Min 2X/week    End of Session Equipment Utilized During Treatment: Rolling walker (2 wheels);Oxygen  Activity Tolerance: Patient limited by fatigue Patient left: in bed;with call bell/phone within reach;with bed alarm set;with family/visitor present  OT Visit Diagnosis: Unsteadiness on feet (R26.81);Muscle weakness (generalized) (M62.81);Other abnormalities of gait and mobility (R26.89);Low vision, both eyes (  H54.2)                Time: 7948-0165 OT Time Calculation (min): 27 min Charges:  OT General Charges $OT Visit: 1 Visit OT Evaluation $OT Eval Moderate Complexity: 1 Mod  Chrishawn Kring OT, MOT  Larey Seat 11/11/2020, 12:19 PM

## 2020-11-11 NOTE — Plan of Care (Signed)
  Problem: Acute Rehab OT Goals (only OT should resolve) Goal: Pt. Will Perform Grooming Flowsheets (Taken 11/11/2020 1222) Pt Will Perform Grooming:  with modified independence  standing  with adaptive equipment Goal: Pt. Will Perform Lower Body Dressing Flowsheets (Taken 11/11/2020 1222) Pt Will Perform Lower Body Dressing:  with modified independence  sit to/from stand  sitting/lateral leans Goal: Pt. Will Transfer To Toilet [place] (Taken 11/11/2020 1222) Pt Will Transfer to Toilet:  with supervision  stand pivot transfer Goal: Pt/Caregiver Will Perform Home Exercise Program Flowsheets (Taken 11/11/2020 1222) Pt/caregiver will Perform Home Exercise Program:  Increased ROM  Increased strength  Right Upper extremity  Left upper extremity  Independently  Caytlyn Evers OT, MOT

## 2020-11-12 DIAGNOSIS — U071 COVID-19: Secondary | ICD-10-CM | POA: Diagnosis not present

## 2020-11-12 DIAGNOSIS — I4821 Permanent atrial fibrillation: Secondary | ICD-10-CM | POA: Diagnosis not present

## 2020-11-12 DIAGNOSIS — J9601 Acute respiratory failure with hypoxia: Secondary | ICD-10-CM | POA: Diagnosis not present

## 2020-11-12 DIAGNOSIS — R1013 Epigastric pain: Secondary | ICD-10-CM | POA: Diagnosis not present

## 2020-11-12 LAB — CBC WITH DIFFERENTIAL/PLATELET
Abs Immature Granulocytes: 0.16 10*3/uL — ABNORMAL HIGH (ref 0.00–0.07)
Basophils Absolute: 0 10*3/uL (ref 0.0–0.1)
Basophils Relative: 0 %
Eosinophils Absolute: 0 10*3/uL (ref 0.0–0.5)
Eosinophils Relative: 0 %
HCT: 30.2 % — ABNORMAL LOW (ref 36.0–46.0)
Hemoglobin: 9.8 g/dL — ABNORMAL LOW (ref 12.0–15.0)
Immature Granulocytes: 2 %
Lymphocytes Relative: 6 %
Lymphs Abs: 0.5 10*3/uL — ABNORMAL LOW (ref 0.7–4.0)
MCH: 31.8 pg (ref 26.0–34.0)
MCHC: 32.5 g/dL (ref 30.0–36.0)
MCV: 98.1 fL (ref 80.0–100.0)
Monocytes Absolute: 0.4 10*3/uL (ref 0.1–1.0)
Monocytes Relative: 5 %
Neutro Abs: 6.8 10*3/uL (ref 1.7–7.7)
Neutrophils Relative %: 87 %
Platelets: 142 10*3/uL — ABNORMAL LOW (ref 150–400)
RBC: 3.08 MIL/uL — ABNORMAL LOW (ref 3.87–5.11)
RDW: 15 % (ref 11.5–15.5)
WBC: 7.8 10*3/uL (ref 4.0–10.5)
nRBC: 0.4 % — ABNORMAL HIGH (ref 0.0–0.2)

## 2020-11-12 LAB — COMPREHENSIVE METABOLIC PANEL
ALT: 25 U/L (ref 0–44)
AST: 36 U/L (ref 15–41)
Albumin: 2.6 g/dL — ABNORMAL LOW (ref 3.5–5.0)
Alkaline Phosphatase: 50 U/L (ref 38–126)
Anion gap: 7 (ref 5–15)
BUN: 27 mg/dL — ABNORMAL HIGH (ref 8–23)
CO2: 29 mmol/L (ref 22–32)
Calcium: 7.4 mg/dL — ABNORMAL LOW (ref 8.9–10.3)
Chloride: 105 mmol/L (ref 98–111)
Creatinine, Ser: 0.63 mg/dL (ref 0.44–1.00)
GFR, Estimated: >60 mL/min (ref >60)
Glucose, Bld: 155 mg/dL — ABNORMAL HIGH (ref 70–99)
Potassium: 4.5 mmol/L (ref 3.5–5.1)
Sodium: 141 mmol/L (ref 135–145)
Total Bilirubin: 0.3 mg/dL (ref 0.3–1.2)
Total Protein: 5.2 g/dL — ABNORMAL LOW (ref 6.5–8.1)

## 2020-11-12 LAB — FERRITIN: Ferritin: 138 ng/mL (ref 11–307)

## 2020-11-12 LAB — C-REACTIVE PROTEIN: CRP: 0.7 mg/dL (ref <1.0)

## 2020-11-12 LAB — GLUCOSE, CAPILLARY
Glucose-Capillary: 127 mg/dL — ABNORMAL HIGH (ref 70–99)
Glucose-Capillary: 172 mg/dL — ABNORMAL HIGH (ref 70–99)

## 2020-11-12 MED ORDER — METHYLPREDNISOLONE SODIUM SUCC 40 MG IJ SOLR
40.0000 mg | Freq: Two times a day (BID) | INTRAMUSCULAR | Status: DC
  Administered 2020-11-12 – 2020-11-13 (×2): 40 mg via INTRAVENOUS
  Filled 2020-11-12 (×2): qty 1

## 2020-11-12 MED ORDER — LEFLUNOMIDE 20 MG PO TABS
20.0000 mg | ORAL_TABLET | Freq: Every day | ORAL | Status: DC
  Administered 2020-11-12 – 2020-11-13 (×2): 20 mg via ORAL
  Filled 2020-11-12 (×5): qty 1

## 2020-11-12 MED ORDER — IPRATROPIUM-ALBUTEROL 20-100 MCG/ACT IN AERS: 1.0000 | INHALATION_SPRAY | Freq: Four times a day (QID) | RESPIRATORY_TRACT | Status: DC | PRN

## 2020-11-12 MED ORDER — IPRATROPIUM-ALBUTEROL 20-100 MCG/ACT IN AERS
1.0000 | INHALATION_SPRAY | Freq: Two times a day (BID) | RESPIRATORY_TRACT | Status: DC
  Administered 2020-11-12: 1 via RESPIRATORY_TRACT

## 2020-11-12 NOTE — NC FL2 (Signed)
[place] LEVEL OF CARE SCREENING TOOL     IDENTIFICATION  Patient Name: [name] Birthdate: [birth year] Sex: female Admission Date (Current Location): 11/08/2020  [facility] and [place] Number:  [employer] and Address:  [place] [address] [place]      Provider Number: [id number]  Attending Physician Name and Address:  Kathie Dike, MD  Relative Name and Phone Number:  LAEL, WETHERBEE)   [phone]    Current Level of Care: Hospital Recommended Level of Care: [place] Prior Approval Number:    Date Approved/Denied: 08/26/18 PASRR Number: [id number]  Discharge Plan: SNF    Current Diagnoses: Patient Active Problem List   Diagnosis Date Noted   Acute respiratory failure ([place]) 11/08/2020   Chronic prescription opiate use 10/31/2020   Atrial fibrillation with rapid ventricular response ([place]) 04/08/2020   Secondary hypercoagulable state ([place]) 02/19/2020   Chronic bronchitis ([place]) 02/16/2020   Physical deconditioning    ILD (interstitial lung disease) ([place]) 08/31/2019   Shortness of breath 08/31/2019   Vitamin D deficiency 09/18/2018   Chronic back pain greater than 3 months duration 09/06/2018   GERD without esophagitis 09/04/2018   Chronic vertigo 09/04/2018   Hypomagnesemia 09/04/2018   Rheumatoid arthritis ([place])    TIA (transient ischemic attack) 08/25/2018   CVA (cerebral vascular accident) Acute subcentimeter infarction in the right [place] 08/25/2018   Chronic anticoagulation/Eliquis for Afib 08/25/2018   Acute CVA (cerebrovascular accident) ([place]) 08/25/2018   Chronic bilateral low back pain without sciatica 09/02/2017   Neuralgia, post-herpetic 09/02/2017   Wedge compression fracture of first lumbar vertebra, initial encounter for closed fracture ([place]) 05/06/2017   CAP (community acquired pneumonia) 02/24/2017   Acute respiratory failure with hypoxia ([place])  02/23/2017   Bronchitis 02/22/2017   Hypoxia 02/22/2017   Generalized weakness 02/22/2017   RAD (reactive airway disease) with wheezing, mild intermittent, with acute exacerbation 02/22/2017   Impingement syndrome of shoulder, left 02/17/2017   History of repair of rotator cuff 11/05/2016   Acute pain of left shoulder due to trauma 10/15/2016   Complete tear of left rotator cuff 10/15/2016   Cervical radiculopathy 09/24/2016   Shoulder pain, left 09/24/2016   Vulvar dystrophy 05/23/2014   Atrial fibrillation ([place]) 08/10/2013   Essential hypertension 07/04/2013   Tachycardia 01/31/2013   Dystrophy, vulva 05/04/2012   Abdominal pain, chronic, epigastric 03/28/2012   Dyspepsia 04/14/2011   History of colon cancer 03/17/2011   Diarrhea 03/17/2011   HLD (hyperlipidemia) 11/30/2006   BRONCHIECTASIS 11/30/2006   RHEUMATOID ARTHRITIS 11/30/2006    Orientation RESPIRATION BLADDER Height & Weight     Self, Time, Situation, Place  O2 (3L) Continent Weight: 104 lb 15 oz (47.6 kg) Height:  5\' 2"  (157.5 cm)  BEHAVIORAL SYMPTOMS/MOOD NEUROLOGICAL BOWEL NUTRITION STATUS      Incontinent Diet (DIET - DYS 1 Room service appropriate? Yes; Fluid consistency: Thin)  AMBULATORY STATUS COMMUNICATION OF NEEDS Skin   Extensive Assist Verbally Skin abrasions (Left foot)                       Personal Care Assistance Level of Assistance  Bathing, Feeding, Dressing Bathing Assistance: Maximum assistance Feeding assistance: Independent Dressing Assistance: Maximum assistance     Functional Limitations Info  Sight, Hearing, Speech Sight Info: Impaired Hearing Info: Adequate Speech Info: Adequate    SPECIAL CARE FACTORS FREQUENCY  PT (By licensed PT), OT (By licensed OT)  PT Frequency: 5x OT Frequency: 5x            Contractures Contractures Info: Not present    Additional Factors Info  Isolation Precautions Code Status Info: DNR Allergies Info: Iodinated Diagnostic Agents   Nerve Block Tray  Nitrofuran Derivatives  Triamcinolone Acetonide  Vibra-tab (doxycycline)     Isolation Precautions Info: COVID+ 11/08/20     Current Medications (11/12/2020):  This is the current hospital active medication list Current Facility-Administered Medications  Medication Dose Route Frequency Provider Last Rate Last Admin   acetaminophen (TYLENOL) tablet 650 mg  650 mg Oral Q6H PRN Kathie Dike, MD       albuterol (VENTOLIN HFA) 108 (90 Base) MCG/ACT inhaler 2 puff  2 puff Inhalation Q4H PRN Kathie Dike, MD       apixaban (ELIQUIS) tablet 2.5 mg  2.5 mg Oral BID Elgergawy, Silver Huguenin, MD   2.5 mg at 11/12/20 9735   calcium carbonate (TUMS - dosed in mg elemental calcium) chewable tablet 200 mg of elemental calcium  1 tablet Oral TID WC Elgergawy, Silver Huguenin, MD   200 mg of elemental calcium at 11/11/20 0835   feeding supplement (ENSURE ENLIVE / ENSURE PLUS) liquid 237 mL  237 mL Oral BID BM Kathie Dike, MD   237 mL at 11/11/20 1531   guaiFENesin-dextromethorphan (ROBITUSSIN DM) 100-10 MG/5ML syrup 10 mL  10 mL Oral Q4H PRN Elgergawy, Silver Huguenin, MD   10 mL at 11/11/20 1309   HYDROcodone bit-homatropine (HYCODAN) 5-1.5 MG/5ML syrup 5 mL  5 mL Oral Q4H PRN Kathie Dike, MD   5 mL at 11/12/20 1159   hyoscyamine (LEVSIN SL) SL tablet 0.125 mg  0.125 mg Sublingual Q6H PRN Memon, Jolaine Artist, MD       Ipratropium-Albuterol (COMBIVENT) respimat 1 puff  1 puff Inhalation BID Kathie Dike, MD       leflunomide (ARAVA) tablet 20 mg  20 mg Oral Daily Memon, Jolaine Artist, MD   20 mg at 11/12/20 1232   lidocaine (XYLOCAINE) 2 % viscous mouth solution 15 mL  15 mL Mouth/Throat Q3H PRN Kathie Dike, MD   15 mL at 11/09/20 1026   loperamide (IMODIUM) capsule 2 mg  2 mg Oral Q6H PRN Kathie Dike, MD   2 mg at 11/09/20 1722   MEDLINE mouth rinse  15 mL Mouth Rinse BID Kathie Dike, MD   15 mL at 11/12/20 0930   melatonin tablet 6 mg  6 mg Oral QHS Elgergawy, Silver Huguenin, MD   6 mg at 11/11/20  2051   methylPREDNISolone sodium succinate (SOLU-MEDROL) 40 mg/mL injection 40 mg  40 mg Intravenous Q12H Memon, Jolaine Artist, MD       metoprolol succinate (TOPROL-XL) 24 hr tablet 25 mg  25 mg Oral Daily Elgergawy, Silver Huguenin, MD   25 mg at 11/12/20 3299   multivitamin with minerals tablet 1 tablet  1 tablet Oral Daily Kathie Dike, MD   1 tablet at 11/12/20 0922   ondansetron (ZOFRAN) injection 4 mg  4 mg Intravenous Q6H PRN Kathie Dike, MD   4 mg at 11/12/20 1134   pantoprazole (PROTONIX) injection 40 mg  40 mg Intravenous Q12H Elgergawy, Silver Huguenin, MD   40 mg at 11/12/20 0930   rosuvastatin (CRESTOR) tablet 20 mg  20 mg Oral q1800 Elgergawy, Silver Huguenin, MD   20 mg at 11/11/20 1720   sucralfate (CARAFATE) 1 GM/10ML suspension 1 g  1 g Oral TID WC & HS Elgergawy, Silver Huguenin, MD  1 g at 11/12/20 1159     Discharge Medications: Please see discharge summary for a list of discharge medications.  Relevant Imaging Results:  Relevant Lab Results:   Additional Information PT SSN [id number]. COVID+ 11/08/20  Jaquel Coomer, Clydene Pugh, LCSW

## 2020-11-12 NOTE — Progress Notes (Signed)
Physical Therapy Treatment Patient Details Name: [name] MRN: [id number] DOB: 1933/05/01 Today's Date: 11/12/2020   History of Present Illness Ayako Tapanes  is a 87 y.o. female, with past medical history relevant for chronic atrial fibrillation, HTN,, HLD, rheumatoid arthritis, CVA, L1/L2 fracture status post kyphoplasty, chronic lower back pain, A. fib, uncontrolled in the past requiring multiple DCCV's, requiring AV node ablation and PPM implant in 03/2020.  -Patient presents to ED secondary to multiple complaints, mainly failure to thrive, she does report chronic nausea, chronic abdominal pain, inability to keep food down, weight loss, very poor appetite, as well recently she does start endorsing some shortness of breath, cough as well, but with known history of interstitial lung disease, COPD, as well she has RA, but with no oxygen requirement currently.  - in ED was noted to be hypoxic, in the low 80s on room air, chest x-ray was significant for multifocal opacities, she is with known chronic interstitial lung disease, her COVID-19 was positive, ET abdomen pelvis with significant abnormalities which appears to be more chronic , he had hospitalist consulted to admit the patient given her COVID-19 positive status and new oxygen requirement.    PT Comments    Patient agreeable for therapy, presents on 2 LPM O2 and her son in room.  Patient demonstrates slow labored movement for sitting up at bedside requiring use of bed rail for completing supine to sitting with HOB flat, fair/good return for completing BLE ROM/strengthening exercises while seated at bedside with verbal cueing and demonstration.  Once fatigued patient has to lie on side to rest for 2-3 minutes before sitting up right and limited to a few slow labored side steps before having to sit due to generalized weakness.  Patient tolerated sitting up in chair after therapy with her son in room.  Patient on room air throughout visit with  SpO2 at 91% after therapy while seated in chair - nurse notified to monitor.  Patient will benefit from continued physical therapy in hospital and recommended venue below to increase strength, balance, endurance for safe ADLs and gait.        Recommendations for follow up therapy are one component of a multi-disciplinary discharge planning process, led by the attending physician.  Recommendations may be updated based on patient status, additional functional criteria and insurance authorization.  Follow Up Recommendations  Skilled nursing-short term rehab (<3 hours/day)     Assistance Recommended at Discharge PRN  Equipment Recommendations  None recommended by PT    Recommendations for Other Services       Precautions / Restrictions Precautions Precautions: Fall Restrictions Weight Bearing Restrictions: No     Mobility  Bed Mobility Overal bed mobility: Needs Assistance Bed Mobility: Supine to Sit     Supine to sit: Min assist     General bed mobility comments: slow labored movement, had to use bed rail with HOB flat    Transfers Overall transfer level: Needs assistance Equipment used: Rolling walker (2 wheels) Transfers: Sit to/from [facility] Sit to Stand: Min assist Stand pivot transfers: Min assist         General transfer comment: slow unsteady labored movement    Ambulation/Gait Ambulation/Gait assistance: Mod assist Gait Distance (Feet): 4 Feet Assistive device: Rolling walker (2 wheels) Gait Pattern/deviations: Decreased step length - left;Decreased stance time - right;Decreased stride length         Stairs             Wheelchair Mobility  Modified Rankin (Stroke Patients Only)       Balance Overall balance assessment: Needs assistance Sitting-balance support: Feet supported;No upper extremity supported Sitting balance-Leahy Scale: Fair Sitting balance - Comments: seated at EOB, once fatigued tends to lie on side  to rest   Standing balance support: Reliant on assistive device for balance;During functional activity;Bilateral upper extremity supported Standing balance-Leahy Scale: Poor Standing balance comment: using RW                            Cognition Arousal/Alertness: Awake/alert Behavior During Therapy: WFL for tasks assessed/performed Overall Cognitive Status: Within Functional Limits for tasks assessed                                          Exercises General Exercises - Lower Extremity Long Arc Quad: Seated;AROM;Strengthening;Both;10 reps Hip Flexion/Marching: Seated;AROM;Strengthening;Both;10 reps Toe Raises: Seated;AROM;Strengthening;Both;10 reps Heel Raises: Seated;AROM;Strengthening;Both;10 reps    General Comments        Pertinent Vitals/Pain Pain Assessment: No/denies pain    Home Living                          Prior Function            PT Goals (current goals can now be found in the care plan section) Acute Rehab PT Goals Patient Stated Goal: to go home PT Goal Formulation: With patient/family Time For Goal Achievement: 11/23/20 Potential to Achieve Goals: Good Progress towards PT goals: Progressing toward goals    Frequency    Min 3X/week      PT Plan Current plan remains appropriate    Co-evaluation              AM-PAC PT "6 Clicks" Mobility   Outcome Measure  Help needed turning from your back to your side while in a flat bed without using bedrails?: A Little Help needed moving from lying on your back to sitting on the side of a flat bed without using bedrails?: A Little Help needed moving to and from a bed to a chair (including a wheelchair)?: A Lot Help needed standing up from a chair using your arms (e.g., wheelchair or bedside chair)?: A Lot Help needed to walk in hospital room?: A Lot Help needed climbing 3-5 steps with a railing? : Total 6 Click Score: 13    End of Session   Activity  Tolerance: Patient tolerated treatment well;Patient limited by fatigue Patient left: in chair;with call bell/phone within reach;with chair alarm set;with family/visitor present Nurse Communication: Mobility status PT Visit Diagnosis: Muscle weakness (generalized) (M62.81);Unsteadiness on feet (R26.81);Other abnormalities of gait and mobility (R26.89)     Time: 3016-0109 PT Time Calculation (min) (ACUTE ONLY): 31 min  Charges:  $Therapeutic Exercise: 8-22 mins $Therapeutic Activity: 8-22 mins                     2:25 PM, 11/12/20 Lonell Grandchild, MPT Physical Therapist with [facility] [phone] office [phone] mobile phone

## 2020-11-12 NOTE — Progress Notes (Signed)
Palliative: Chart review completed.  Time for outcomes.  PMT to follow-up with patient and family 11/2.  Conference with attending, bedside nursing staff, transition of care team related to patient condition, needs, goals of care.  No charge Quinn Axe, NP Palliative medicine team Team phone [phone] Greater than 50% of this time was spent counseling and coordinating care related to the above assessment and plan.

## 2020-11-12 NOTE — Progress Notes (Signed)
PROGRESS NOTE    [name]  AYT:[id number] DOB: Mar 26, 1933 DOA: 11/08/2020 PCP: Sharilyn Sites, MD    Brief Narrative:  87 year old female with a history of atrial fibrillation, rheumatoid arthritis, chronic low back pain, chronic abdominal pain followed by GI.  She comes to the emergency room with worsening nausea, abdominal pain, frequent stools.  She also reports shortness of breath and cough.  She was found to be COVID-positive.  She was noted to be hypoxic on room air with increased work of breathing.  Started on remdesivir, steroids and oxygen. Patient is quite weak/frail at baseline. With her significant comorbidities, she was felt to have higher risk of mortality. Fortunately, she appears to be improving. Palliative care following. She will hopefully be able to discharge to SNF in 1-2 days if she continues to improve.   Assessment & Plan:   Active Problems:   Abdominal pain, chronic, epigastric   Essential hypertension   Atrial fibrillation (HCC)   Chronic anticoagulation/Eliquis for Afib   GERD without esophagitis   Acute respiratory failure (HCC)   Acute respiratory failure with hypoxia -Noted to have oxygen saturations around 82 to 85% on room air.  She was also quite short of breath in conversation. -She does have underlying interstitial lung disease, COPD and now has COVID-19 pneumonia. -Continue supportive measures and wean off oxygen as tolerated -currently she is down to 2L, can hopefully wean oxygen off in the next 24 hours  COVID-19 pneumonia -Due to her shortness of breath, cough, multiple comorbidities and higher risk of decompensation, she has been started on remdesivir and steroids -Inflammatory markers were not significantly elevated, but clinically she is very short of breath and has significant secretions. -Continue current treatments and pulmonary hygiene -clinically, she appears to be improving -currently on solumedrol 80mg  IV q12h, will reduce dose  and start to taper  Chronic abdominal pain/nausea -She is followed by GI -Plans were for outpatient MRI of abdomen -This will have to be done at [facility] since she has a pacemaker -I suspect this can wait until her respiratory status has stabilized -Continue supportive measures with pain management, PPI, antiemetics -Also, she will likely need EGD for evidence of gastritis on imaging.  This will likely have to be delayed several weeks until her respiratory status/COVID infection have stabilized  Atrial fibrillation -Anticoagulated with Eliquis -She is on metoprolol for rate control  Rheumatoid arthritis -She is chronically on leflunomide   Failure to thrive -Patient continues to have poor p.o. intake -We will get nutrition input regarding supplements -Use viscous lidocaine for mouth soreness  Generalized weakness -Seen by physical therapy with recommendations for skilled facility placement -patient is agreeable for placement  Goals of care -patient is DNR -palliative care following to engage with family around goals of care and to assist with transitioning to hospice if patient does not make meaningful improvements -Fortunately, it appears that she is starting to improve and will hopefully be able to go to SNF in the next 1-2 days   DVT prophylaxis: apixaban (ELIQUIS) tablet 2.5 mg Start: 11/08/20 2200 apixaban (ELIQUIS) tablet 2.5 mg  Code Status: DNR Family Communication: Discussed with patient's son and daughter at the bedside Disposition Plan: Status is: Inpatient  Remains inpatient appropriate because: Continued need for IV treatments with remdesivir, steroids and oxygen for COVID-pneumonia   Consultants:  Palliative care  Procedures:    Antimicrobials:      Subjective: Feels that she is starting to expectorate more sputum with cough, breathing slowly  improving. Po intake has also started to improve. Feels stronger today  Objective: Vitals:   11/12/20  0206 11/12/20 0500 11/12/20 0600 11/12/20 0731  BP:   (!) 159/65   Pulse:   75   Resp:   18   Temp:   98.2 F (36.8 C)   TempSrc:   Oral   SpO2: 95%  92% 93%  Weight:  47.6 kg    Height:        Intake/Output Summary (Last 24 hours) at 11/12/2020 1131 Last data filed at 11/12/2020 0830 Gross per 24 hour  Intake 1669.36 ml  Output 650 ml  Net 1019.36 ml   Filed Weights   11/10/20 0551 11/11/20 0500 11/12/20 0500  Weight: 52.6 kg 53.2 kg 47.6 kg    Examination:  General exam: Alert, awake, oriented x 3 Respiratory system: scattered rhonchi. Respiratory effort normal. Cardiovascular system:RRR. No murmurs, rubs, gallops. Gastrointestinal system: Abdomen is nondistended, soft and nontender. No organomegaly or masses felt. Normal bowel sounds heard. Central nervous system: Alert and oriented. No focal neurological deficits. Extremities: No C/C/E, +pedal pulses Skin: No rashes, lesions or ulcers Psychiatry: Judgement and insight appear normal. Mood & affect appropriate.       Data Reviewed: I have personally reviewed following labs and imaging studies  CBC: Recent Labs  Lab 11/08/20 1300 11/09/20 0327 11/10/20 0520 11/11/20 0533 11/12/20 0619  WBC 4.1 4.3 8.2 9.8 7.8  NEUTROABS 2.9 3.4 7.2 8.7* 6.8  HGB 10.9* 11.0* 10.2* 10.1* 9.8*  HCT 33.4* 33.8* 31.5* 31.3* 30.2*  MCV 97.7 97.4 96.0 98.1 98.1  PLT 141* 126* 133* 133* 400*   Basic Metabolic Panel: Recent Labs  Lab 11/08/20 1300 11/08/20 2117 11/09/20 0327 11/10/20 0520 11/11/20 0533 11/12/20 0619  NA 139  --  138 138 139 141  K 3.6  --  5.0 4.7 4.5 4.5  CL 101  --  102 103 103 105  CO2 29  --  29 27 27 29   GLUCOSE 107*  --  134* 168* 160* 155*  BUN 17  --  12 24* 26* 27*  CREATININE 0.56  --  0.64 0.67 0.57 0.63  CALCIUM 7.8*  --  7.5* 7.4* 7.3* 7.4*  MG  --  1.6*  --   --   --   --    GFR: Estimated Creatinine Clearance: 38.6 mL/min (by C-G formula based on SCr of 0.63 mg/dL). Liver Function  Tests: Recent Labs  Lab 11/08/20 1300 11/09/20 0327 11/10/20 0520 11/11/20 0533 11/12/20 0619  AST 29 30 24 26  36  ALT 14 15 13 14 25   ALKPHOS 55 57 50 48 50  BILITOT 0.3 0.1* <0.1* 0.4 0.3  PROT 6.0* 6.1* 5.6* 5.6* 5.2*  ALBUMIN 3.0* 3.0* 2.8* 2.7* 2.6*   Recent Labs  Lab 11/08/20 1300  LIPASE 24   No results for input(s): AMMONIA in the last 168 hours. Coagulation Profile: No results for input(s): INR, PROTIME in the last 168 hours. Cardiac Enzymes: No results for input(s): CKTOTAL, CKMB, CKMBINDEX, TROPONINI in the last 168 hours. BNP (last 3 results) No results for input(s): PROBNP in the last 8760 hours. HbA1C: No results for input(s): HGBA1C in the last 72 hours. CBG: Recent Labs  Lab 11/12/20 0741  GLUCAP 127*   Lipid Profile: No results for input(s): CHOL, HDL, LDLCALC, TRIG, CHOLHDL, LDLDIRECT in the last 72 hours. Thyroid Function Tests: No results for input(s): TSH, T4TOTAL, FREET4, T3FREE, THYROIDAB in the last 72 hours. Anemia Panel: Recent  Labs    11/11/20 0533 11/12/20 0619  FERRITIN 131 138   Sepsis Labs: No results for input(s): PROCALCITON, LATICACIDVEN in the last 168 hours.  Recent Results (from the past 240 hour(s))  Resp Panel by RT-PCR (Flu A&B, Covid) Nasopharyngeal Swab     Status: Abnormal   Collection Time: 11/08/20  1:20 PM   Specimen: Nasopharyngeal Swab; Nasopharyngeal(NP) swabs in vial transport medium  Result Value Ref Range Status   SARS Coronavirus 2 by RT PCR POSITIVE (A) NEGATIVE Final    Comment: RESULT CALLED TO, READ BACK BY AND VERIFIED WITH: H CRAWFORD RN [phone] K FORSYTH (NOTE) SARS-CoV-2 target nucleic acids are DETECTED.  The SARS-CoV-2 RNA is generally detectable in upper respiratory specimens during the acute phase of infection. Positive results are indicative of the presence of the identified virus, but do not rule out bacterial infection or co-infection with other pathogens not detected by the test.  Clinical correlation with patient history and other diagnostic information is necessary to determine patient infection status. The expected result is Negative.  Fact Sheet for Patients: EntrepreneurPulse.com.au  Fact Sheet for Healthcare Providers: IncredibleEmployment.be  This test is not yet approved or cleared by the [place] FDA and  has been authorized for detection and/or diagnosis of SARS-CoV-2 by FDA under an Emergency Use Authorization (EUA).  This EUA will remain in effect (meaning this test can  be used) for the duration of  the COVID-19 declaration under Section 564(b)(1) of the Act, 21 U.S.C. section 360bbb-3(b)(1), unless the authorization is terminated or revoked sooner.     Influenza A by PCR NEGATIVE NEGATIVE Final   Influenza B by PCR NEGATIVE NEGATIVE Final    Comment: (NOTE) The Xpert Xpress SARS-CoV-2/FLU/RSV plus assay is intended as an aid in the diagnosis of influenza from Nasopharyngeal swab specimens and should not be used as a sole basis for treatment. Nasal washings and aspirates are unacceptable for Xpert Xpress SARS-CoV-2/FLU/RSV testing.  Fact Sheet for Patients: EntrepreneurPulse.com.au  Fact Sheet for Healthcare Providers: IncredibleEmployment.be  This test is not yet approved or cleared by the [place] FDA and has been authorized for detection and/or diagnosis of SARS-CoV-2 by FDA under an Emergency Use Authorization (EUA). This EUA will remain in effect (meaning this test can be used) for the duration of the COVID-19 declaration under Section 564(b)(1) of the Act, 21 U.S.C. section 360bbb-3(b)(1), unless the authorization is terminated or revoked.  Performed at [facility], [address], [place], [place] [address]          Radiology Studies: No results found.      Scheduled Meds:  apixaban  2.5 mg Oral BID   calcium carbonate  1 tablet Oral  TID WC   feeding supplement  237 mL Oral BID BM   Ipratropium-Albuterol  1 puff Inhalation BID   mouth rinse  15 mL Mouth Rinse BID   melatonin  6 mg Oral QHS   methylPREDNISolone (SOLU-MEDROL) injection  80 mg Intravenous Q12H   metoprolol succinate  25 mg Oral Daily   multivitamin with minerals  1 tablet Oral Daily   pantoprazole (PROTONIX) IV  40 mg Intravenous Q12H   rosuvastatin  20 mg Oral q1800   sucralfate  1 g Oral TID WC & HS   Continuous Infusions:  lactated ringers Stopped (11/11/20 2047)     LOS: 4 days    Time spent: 66mins    Kathie Dike, MD [facility]   If 7PM-7AM, please contact night-coverage [email or web address]  11/12/2020, 11:31 AM

## 2020-11-12 NOTE — TOC Progression Note (Signed)
Transition of Care [facility]) - Progression Note    Patient Details  Name: [name] MRN: [id number] Date of Birth: 01/15/1933  Transition of Care [facility]) CM/SW Contact  Ihor Gully, LCSW Phone Number: 11/12/2020, 2:22 PM  Clinical Narrative:    Patient family is now agreeable to SNF. Referral made to [facility] accepting SNFs.    Expected Discharge Plan: Skilled Nursing Facility Barriers to Discharge: Continued Medical Work up  Expected Discharge Plan and Services Expected Discharge Plan: [place]                                               Social Determinants of Health (SDOH) Interventions    Readmission Risk Interventions No flowsheet data found.

## 2020-11-13 DIAGNOSIS — M6281 Muscle weakness (generalized): Secondary | ICD-10-CM | POA: Diagnosis not present

## 2020-11-13 DIAGNOSIS — J9601 Acute respiratory failure with hypoxia: Secondary | ICD-10-CM | POA: Diagnosis not present

## 2020-11-13 DIAGNOSIS — Z7901 Long term (current) use of anticoagulants: Secondary | ICD-10-CM | POA: Diagnosis not present

## 2020-11-13 DIAGNOSIS — J449 Chronic obstructive pulmonary disease, unspecified: Secondary | ICD-10-CM | POA: Diagnosis not present

## 2020-11-13 DIAGNOSIS — Z515 Encounter for palliative care: Secondary | ICD-10-CM | POA: Diagnosis not present

## 2020-11-13 DIAGNOSIS — J9600 Acute respiratory failure, unspecified whether with hypoxia or hypercapnia: Secondary | ICD-10-CM | POA: Diagnosis not present

## 2020-11-13 DIAGNOSIS — K219 Gastro-esophageal reflux disease without esophagitis: Secondary | ICD-10-CM

## 2020-11-13 DIAGNOSIS — Z950 Presence of cardiac pacemaker: Secondary | ICD-10-CM | POA: Diagnosis not present

## 2020-11-13 DIAGNOSIS — R627 Adult failure to thrive: Secondary | ICD-10-CM | POA: Diagnosis not present

## 2020-11-13 DIAGNOSIS — R109 Unspecified abdominal pain: Secondary | ICD-10-CM | POA: Diagnosis not present

## 2020-11-13 DIAGNOSIS — Z7189 Other specified counseling: Secondary | ICD-10-CM | POA: Diagnosis not present

## 2020-11-13 DIAGNOSIS — M069 Rheumatoid arthritis, unspecified: Secondary | ICD-10-CM | POA: Diagnosis not present

## 2020-11-13 DIAGNOSIS — I4891 Unspecified atrial fibrillation: Secondary | ICD-10-CM | POA: Diagnosis not present

## 2020-11-13 DIAGNOSIS — I4821 Permanent atrial fibrillation: Secondary | ICD-10-CM | POA: Diagnosis not present

## 2020-11-13 DIAGNOSIS — E785 Hyperlipidemia, unspecified: Secondary | ICD-10-CM | POA: Diagnosis not present

## 2020-11-13 DIAGNOSIS — U071 COVID-19: Secondary | ICD-10-CM | POA: Diagnosis not present

## 2020-11-13 DIAGNOSIS — I10 Essential (primary) hypertension: Secondary | ICD-10-CM | POA: Diagnosis not present

## 2020-11-13 DIAGNOSIS — G8929 Other chronic pain: Secondary | ICD-10-CM | POA: Diagnosis not present

## 2020-11-13 DIAGNOSIS — E46 Unspecified protein-calorie malnutrition: Secondary | ICD-10-CM | POA: Diagnosis not present

## 2020-11-13 DIAGNOSIS — Z79899 Other long term (current) drug therapy: Secondary | ICD-10-CM | POA: Diagnosis not present

## 2020-11-13 DIAGNOSIS — R1013 Epigastric pain: Secondary | ICD-10-CM | POA: Diagnosis not present

## 2020-11-13 DIAGNOSIS — Z8673 Personal history of transient ischemic attack (TIA), and cerebral infarction without residual deficits: Secondary | ICD-10-CM | POA: Diagnosis not present

## 2020-11-13 DIAGNOSIS — J441 Chronic obstructive pulmonary disease with (acute) exacerbation: Secondary | ICD-10-CM | POA: Diagnosis not present

## 2020-11-13 LAB — COMPREHENSIVE METABOLIC PANEL
ALT: 39 U/L (ref 0–44)
AST: 50 U/L — ABNORMAL HIGH (ref 15–41)
Albumin: 2.6 g/dL — ABNORMAL LOW (ref 3.5–5.0)
Alkaline Phosphatase: 55 U/L (ref 38–126)
Anion gap: 7 (ref 5–15)
BUN: 26 mg/dL — ABNORMAL HIGH (ref 8–23)
CO2: 29 mmol/L (ref 22–32)
Calcium: 7.9 mg/dL — ABNORMAL LOW (ref 8.9–10.3)
Chloride: 104 mmol/L (ref 98–111)
Creatinine, Ser: 0.65 mg/dL (ref 0.44–1.00)
GFR, Estimated: >60 mL/min (ref >60)
Glucose, Bld: 150 mg/dL — ABNORMAL HIGH (ref 70–99)
Potassium: 4.5 mmol/L (ref 3.5–5.1)
Sodium: 140 mmol/L (ref 135–145)
Total Bilirubin: 0.2 mg/dL — ABNORMAL LOW (ref 0.3–1.2)
Total Protein: 5.4 g/dL — ABNORMAL LOW (ref 6.5–8.1)

## 2020-11-13 LAB — CBC WITH DIFFERENTIAL/PLATELET
Abs Immature Granulocytes: 0.34 10*3/uL — ABNORMAL HIGH (ref 0.00–0.07)
Basophils Absolute: 0.1 10*3/uL (ref 0.0–0.1)
Basophils Relative: 1 %
Eosinophils Absolute: 0 10*3/uL (ref 0.0–0.5)
Eosinophils Relative: 0 %
HCT: 32.7 % — ABNORMAL LOW (ref 36.0–46.0)
Hemoglobin: 10.3 g/dL — ABNORMAL LOW (ref 12.0–15.0)
Immature Granulocytes: 4 %
Lymphocytes Relative: 6 %
Lymphs Abs: 0.5 10*3/uL — ABNORMAL LOW (ref 0.7–4.0)
MCH: 30.8 pg (ref 26.0–34.0)
MCHC: 31.5 g/dL (ref 30.0–36.0)
MCV: 97.9 fL (ref 80.0–100.0)
Monocytes Absolute: 0.3 10*3/uL (ref 0.1–1.0)
Monocytes Relative: 4 %
Neutro Abs: 6.9 10*3/uL (ref 1.7–7.7)
Neutrophils Relative %: 85 %
Platelets: 154 10*3/uL (ref 150–400)
RBC: 3.34 MIL/uL — ABNORMAL LOW (ref 3.87–5.11)
RDW: 14.8 % (ref 11.5–15.5)
WBC: 8.1 10*3/uL (ref 4.0–10.5)
nRBC: 0.9 % — ABNORMAL HIGH (ref 0.0–0.2)

## 2020-11-13 LAB — FERRITIN: Ferritin: 147 ng/mL (ref 11–307)

## 2020-11-13 LAB — C-REACTIVE PROTEIN: CRP: 0.6 mg/dL (ref <1.0)

## 2020-11-13 MED ORDER — CALCIUM CARBONATE ANTACID 500 MG PO CHEW: 1.0000 | CHEWABLE_TABLET | Freq: Two times a day (BID) | ORAL | 1 refills | Status: DC

## 2020-11-13 MED ORDER — ZINC GLUCONATE 50 MG PO TABS: 50.0000 mg | ORAL_TABLET | Freq: Every day | ORAL | 3 refills | Status: DC

## 2020-11-13 MED ORDER — HYDROCODONE-ACETAMINOPHEN 7.5-325 MG PO TABS: 1.0000 | ORAL_TABLET | Freq: Three times a day (TID) | ORAL | 0 refills | Status: DC | PRN

## 2020-11-13 MED ORDER — HYDROCODONE BIT-HOMATROP MBR 5-1.5 MG/5ML PO SOLN: 5.0000 mL | Freq: Four times a day (QID) | ORAL | Status: DC

## 2020-11-13 MED ORDER — ADULT MULTIVITAMIN W/MINERALS CH: 1.0000 | ORAL_TABLET | Freq: Every day | ORAL | 3 refills | Status: DC

## 2020-11-13 MED ORDER — PANTOPRAZOLE SODIUM 40 MG PO TBEC: 40.0000 mg | DELAYED_RELEASE_TABLET | Freq: Every day | ORAL | Status: DC

## 2020-11-13 MED ORDER — PREDNISONE 20 MG PO TABS: 40.0000 mg | ORAL_TABLET | Freq: Every day | ORAL | 0 refills | Status: AC

## 2020-11-13 MED ORDER — HYDROCODONE BIT-HOMATROP MBR 5-1.5 MG/5ML PO SOLN: 5.0000 mL | ORAL | 0 refills | Status: DC | PRN

## 2020-11-13 MED ORDER — IPRATROPIUM-ALBUTEROL 20-100 MCG/ACT IN AERS: 1.0000 | INHALATION_SPRAY | Freq: Four times a day (QID) | RESPIRATORY_TRACT | 1 refills | Status: DC | PRN

## 2020-11-13 MED ORDER — GUAIFENESIN-DM 100-10 MG/5ML PO SYRP: 10.0000 mL | ORAL_SOLUTION | ORAL | 0 refills | Status: DC | PRN

## 2020-11-13 MED ORDER — MELATONIN 3 MG PO TABS: 6.0000 mg | ORAL_TABLET | Freq: Every day | ORAL | 0 refills | Status: DC

## 2020-11-13 MED ORDER — ENSURE ENLIVE PO LIQD: 237.0000 mL | Freq: Two times a day (BID) | ORAL | 12 refills | Status: DC

## 2020-11-13 MED ORDER — HYDROCODONE BIT-HOMATROP MBR 5-1.5 MG/5ML PO SOLN: 5.0000 mL | ORAL | Status: DC | PRN

## 2020-11-13 MED ORDER — VITAMIN C 250 MG PO TABS: 250.0000 mg | ORAL_TABLET | Freq: Every day | ORAL | 2 refills | Status: DC

## 2020-11-13 MED ORDER — ALBUTEROL SULFATE HFA 108 (90 BASE) MCG/ACT IN AERS: 2.0000 | INHALATION_SPRAY | RESPIRATORY_TRACT | 1 refills | Status: DC | PRN

## 2020-11-13 NOTE — Care Management Important Message (Signed)
Important Message  Patient Details  Name: [name] MRN: [id number] Date of Birth: 1933-10-24   Medicare Important Message Given:  Yes - Important Message mailed due to current National Emergency     Tommy Medal 11/13/2020, 2:14 PM

## 2020-11-13 NOTE — Progress Notes (Signed)
Palliative: [name] is resting quietly in bed.  She appears acutely/chronically ill and very frail.   Although she is alert and able to make her basic needs known, she is sleeping most of the day per her son [name] who is at bedside.   We talked about her discharge to short-term rehab.  At this point due to COVID positive status, family would like a trial of short-term rehab.  I encourage [name] to do the best that she can.  Medications reviewed.  Conference with attending related to patient condition, needs, goals of care.  Plan:   At this point continue to treat the treatable but no CPR or intubation.  Trial of short-term rehab.  Interested in outpatient palliative/hospice care.  Family is able to set this up as needed.  25 minutes  Quinn Axe, NP Palliative medicine team Team phone [phone] Greater than 50% of this time was spent counseling and coordinating care related to the above assessment and plan.

## 2020-11-13 NOTE — Progress Notes (Signed)
Nutrition Follow-up  DOCUMENTATION CODES:   Not applicable  INTERVENTION:  Ensure Enlive po BID, each supplement provides 350 kcal and 20 grams of protein   Assist with feeding as needed all meals to maximize nutrition intake  LAST BM 10/29- recommend consider adding bowel supplement  NUTRITION DIAGNOSIS:   Increased nutrient needs related to acute illness (COVID-19) as evidenced by estimated needs.   GOAL:   Patient will meet greater than or equal to 90% of their needs   MONITOR:   PO intake, Supplement acceptance, Diet advancement, Weight trends, Labs, Skin, I & O's  REASON FOR ASSESSMENT:   Consult Assessment of nutrition requirement/status  ASSESSMENT:   87 year old female with a history of atrial fibrillation, rheumatoid arthritis, chronic low back pain, chronic abdominal pain followed by GI.  She comes to the emergency room with worsening nausea, abdominal pain, frequent stools.  She also reports shortness of breath and cough.  She was found to be COVID-positive.  She was noted to be hypoxic on room air with increased work of breathing.  Started on remdesivir, steroids and oxygen.  Patient is receiving pureed foods. Poor oral intake 0-50% of meals since admission.   Medications: TUMs, Solumedrol, Carafate, crestor.   Her weight has increased significantly (9%) x 2 weeks from recent admission 10/21- 45.5 kg  to current 49.7 kg.   Labs: BMP Latest Ref Rng & Units 11/13/2020 11/12/2020 11/11/2020  Glucose 70 - 99 mg/dL 150(H) 155(H) 160(H)  BUN 8 - 23 mg/dL 26(H) 27(H) 26(H)  Creatinine 0.44 - 1.00 mg/dL 0.65 0.63 0.57  Sodium 135 - 145 mmol/L 140 141 139  Potassium 3.5 - 5.1 mmol/L 4.5 4.5 4.5  Chloride 98 - 111 mmol/L 104 105 103  CO2 22 - 32 mmol/L 29 29 27   Calcium 8.9 - 10.3 mg/dL 7.9(L) 7.4(L) 7.3(L)     NUTRITION - FOCUSED PHYSICAL EXAM: Unable to complete Nutrition-Focused physical exam at this time.    Diet Order:   Diet Order             DIET -  DYS 1 Room service appropriate? Yes; Fluid consistency: Thin  Diet effective now                   EDUCATION NEEDS:   No education needs have been identified at this time  Skin:  Skin Assessment: Reviewed RN Assessment  Last BM:  10/29  Height:   Ht Readings from Last 1 Encounters:  11/10/20 5\' 2"  (1.575 m)    Weight:   Wt Readings from Last 1 Encounters:  11/13/20 49.7 kg    Ideal Body Weight:  50 kg  BMI:  Body mass index is 20.04 kg/m.  Estimated Nutritional Needs:   Kcal:  1650-1850  Protein:  80-95 grams  Fluid:  > 1.6 L    Colman Cater MS,RD,CSG,LDN Contact: Shea Evans

## 2020-11-13 NOTE — Progress Notes (Signed)
Last documented BM in chart is 11/08/2020. When questioned, pt states that her last BM was "day before yesterday, it was loose". Pt's daughter states pt had multiple loose BM's upon arrival to ED 11/08/20 and in ED on 11/09/20 prior to being brought up to inpatient room #341.

## 2020-11-13 NOTE — Progress Notes (Signed)
Pt remains up in recliner but is requesting to go back to bed. States feels really tired and needs to lie down. Pt able to stand on her own and hold to [place] to take small steps to bed and pivot to sit with only standby assistance. Pt wet with urine, pt cleaned and purewick reapplied,secured with mesh panties. Pt tolerated movement well, no increased SOB noted. Son remains at bedside.

## 2020-11-13 NOTE — Discharge Instructions (Signed)
1)Avoid ibuprofen/Advil/Aleve/Motrin/Goody Powders/Naproxen/BC powders/Meloxicam/Diclofenac/Indomethacin and other Nonsteroidal anti-inflammatory medications as these will make you more likely to bleed and can cause stomach ulcers, can also cause Kidney problems.   2)Repeat CBC and BMP blood test on Monday 11/18/2020

## 2020-11-13 NOTE — Progress Notes (Signed)
Occupational Therapy Treatment Patient Details Name: [name] MRN: [id number] DOB: 18-Apr-1933 Today's Date: 11/13/2020   History of present illness [name]  is a 87 y.o. female, with past medical history relevant for chronic atrial fibrillation, HTN,, HLD, rheumatoid arthritis, CVA, L1/L2 fracture status post kyphoplasty, chronic lower back pain, A. fib, uncontrolled in the past requiring multiple DCCV's, requiring AV node ablation and PPM implant in 03/2020.  -Patient presents to ED secondary to multiple complaints, mainly failure to thrive, she does report chronic nausea, chronic abdominal pain, inability to keep food down, weight loss, very poor appetite, as well recently she does start endorsing some shortness of breath, cough as well, but with known history of interstitial lung disease, COPD, as well she has RA, but with no oxygen requirement currently.  - in ED was noted to be hypoxic, in the low 80s on room air, chest x-ray was significant for multifocal opacities, she is with known chronic interstitial lung disease, her COVID-19 was positive, ET abdomen pelvis with significant abnormalities which appears to be more chronic , he had hospitalist consulted to admit the patient given her COVID-19 positive status and new oxygen requirement.   OT comments  Pt agreeable to OT treatment. Pt placed on room air for duration of session staying above 90% SpO2. Pt able to complete supine A/ROM exercises with mild limitations noted in L UE during movement. Pt able to complete sit to supine bed mobility with min A with slow labored movement. Min A needed for stand pivot transfer to chair from EOB. Pt clearly fatigued but motivated to participate. Pt left in chair and placed back on oxygen. Pt's family requested information on benefit of rehab at this time. Family informed pt is recommended for SNF due to pt's level of motivation and willingness to participate in therapy making it likely that pt could  make mild to moderate improvements from more rehab. Pt will benefit from continued OT in the hospital and recommended venue below to increase strength, balance, and endurance for safe ADL's.        Recommendations for follow up therapy are one component of a multi-disciplinary discharge planning process, led by the attending physician.  Recommendations may be updated based on patient status, additional functional criteria and insurance authorization.    Follow Up Recommendations  Skilled nursing-short term rehab (<3 hours/day)    Assistance Recommended at Discharge Intermittent Supervision/Assistance  Equipment Recommendations  None recommended by OT          Precautions / Restrictions Precautions Precautions: Fall Restrictions Weight Bearing Restrictions: No       Mobility Bed Mobility   Bed Mobility: Supine to Sit     Supine to sit: Min assist;HOB elevated     General bed mobility comments: Slow labored movement; single hand held assist and extneded time.    Transfers Overall transfer level: Needs assistance Equipment used: Rolling walker (2 wheels) Transfers: Sit to/from [facility] Sit to Stand: Min assist Stand pivot transfers: Min assist               Balance Overall balance assessment: Needs assistance Sitting-balance support: Feet supported;No upper extremity supported Sitting balance-Leahy Scale: Fair Sitting balance - Comments: seated at EOB, periodic resting to side   Standing balance support: Reliant on assistive device for balance;During functional activity;Bilateral upper extremity supported Standing balance-Leahy Scale: Poor Standing balance comment: using RW  ADL either performed or assessed with clinical judgement   ADL                                       Functional mobility during ADLs: Minimal assistance;Rolling walker (2 wheels) General ADL Comments: Min A for  stand pivot transfer with RW.                       Cognition Arousal/Alertness: Awake/alert Behavior During Therapy: WFL for tasks assessed/performed Overall Cognitive Status: Within Functional Limits for tasks assessed                                            Exercises Exercises: General Upper Extremity General Exercises - Upper Extremity Shoulder Flexion: AROM;Supine;5 reps;Both (x 7 protraction until pt fatigued) Shoulder ABduction: AROM;5 reps;Supine;Both Shoulder Horizontal ABduction: AROM;5 reps;Both;Supine   Shoulder Instructions       General Comments      Pertinent Vitals/ Pain       Pain Assessment: No/denies pain                                                          Frequency  Min 2X/week        Progress Toward Goals  OT Goals(current goals can now be found in the care plan section)  Progress towards OT goals: Progressing toward goals  Acute Rehab OT Goals Patient Stated Goal: Attend rehab prior to return home. OT Goal Formulation: With patient/family Time For Goal Achievement: 11/25/20 Potential to Achieve Goals: Fair ADL Goals Pt Will Perform Grooming: with modified independence;standing;with adaptive equipment Pt Will Perform Lower Body Dressing: with modified independence;sit to/from stand;sitting/lateral leans Pt Will Transfer to Toilet: with supervision;stand pivot transfer Pt/caregiver will Perform Home Exercise Program: Increased ROM;Increased strength;Right Upper extremity;Left upper extremity;Independently  Plan Discharge plan remains appropriate                                    End of Session Equipment Utilized During Treatment: Rolling walker (2 wheels);Oxygen  OT Visit Diagnosis: Unsteadiness on feet (R26.81);Muscle weakness (generalized) (M62.81);Other abnormalities of gait and mobility (R26.89);Low vision, both eyes (H54.2)   Activity Tolerance Patient tolerated  treatment well   Patient Left with call bell/phone within reach;with family/visitor present;in chair;with chair alarm set   Nurse Communication          Time: [id number] OT Time Calculation (min): 23 min  Charges: OT General Charges $OT Visit: 1 Visit OT Treatments $Therapeutic Exercise: 8-22 mins  Jannat Rosemeyer OT, MOT  Larey Seat 11/13/2020, 9:59 AM

## 2020-11-13 NOTE — Discharge Summary (Signed)
[name], is a 87 y.o. female  DOB 11/19/1933  MRN [id number].  Admission date:  11/08/2020  Admitting Physician  Albertine Patricia, MD  Discharge Date:  11/13/2020   Primary MD  Sharilyn Sites, MD  Recommendations for primary care physician for things to follow:  1)Avoid ibuprofen/Advil/Aleve/Motrin/Goody Powders/Naproxen/BC powders/Meloxicam/Diclofenac/Indomethacin and other Nonsteroidal anti-inflammatory medications as these will make you more likely to bleed and can cause stomach ulcers, can also cause Kidney problems.   2)Repeat CBC and BMP blood test on Monday 11/18/2020   Admission Diagnosis  Acute respiratory failure (HCC) [J96.00] Nausea [R11.0] COVID [U07.1]   Discharge Diagnosis  Acute respiratory failure (HCC) [J96.00] Nausea [R11.0] COVID [U07.1]    Active Problems:   Chronic anticoagulation/Eliquis for Afib   Atrial fibrillation (HCC)   Abdominal pain, chronic, epigastric   Essential hypertension   GERD without esophagitis   Acute respiratory failure (HCC)      Past Medical History:  Diagnosis Date   A-fib [facility])    Atrial fibrillation (HCC)    Cervicalgia    Chronic bronchitis with COPD (chronic obstructive pulmonary disease) (HCC)    Colon cancer (HCC)    Degeneration of cervical intervertebral disc    History of multiple strokes 2021   2 strokes within same day   Hypertension    Pinched nerve in shoulder    Rheumatoid arthritis(714.0)    RUQ pain    Vulvar dystrophy    Yeast infection     Past Surgical History:  Procedure Laterality Date   ABDOMINAL HYSTERECTOMY  09/13/1958   APPENDECTOMY  with hysterectomy   AV NODE ABLATION N/A 04/08/2020   Procedure: AV NODE ABLATION;  Surgeon: Evans Lance, MD;  Location: [place] [facility];  Service: Cardiovascular;  Laterality: N/A;   BACK SURGERY  12/08/2010   plate/screws C3 C4   CARDIOVERSION N/A 09/29/2019    Procedure: CARDIOVERSION;  Surgeon: Arnoldo Lenis, MD;  [facility];  Service: Endoscopy;  Laterality: N/A;  pt ate early this AM per Dr. Harl Bowie   CARDIOVERSION N/A 02/26/2020   Procedure: CARDIOVERSION;  Surgeon: Sueanne Margarita, MD;  Location: [facility] ENDOSCOPY;  Service: Cardiovascular;  Laterality: N/A;   CHOLECYSTECTOMY  01/13/2000   COLONOSCOPY  04/02/2009   COLONOSCOPY  06/04/2005   COLONOSCOPY N/A 07/26/2015   Rehman:examined portion of ileum normal, patent end to end ileocolonic anastomosis, characterized by healthy appearing mucosa, diverticulosis in sigmoid colon, exam otherwise normal, internal hemorrhoids, no specimens   COLONOSCOPY WITH ESOPHAGOGASTRODUODENOSCOPY (EGD) N/A 04/08/2012   Procedure: COLONOSCOPY WITH ESOPHAGOGASTRODUODENOSCOPY (EGD);  Surgeon: Rogene Houston, MD;  [facility];  Service: Endoscopy;  Laterality: N/A;  130-moved to 245 Ann notified pt   ESOPHAGOGASTRODUODENOSCOPY N/A 07/26/2015   Rehman: normal esophagus, z line regular 40 cm from incisors, normal stomach, normal duodenal bulb and second portion of duodenum, no specimens.   HEMICOLECTOMY  10/13/2003   PACEMAKER IMPLANT N/A 04/08/2020   Procedure: PACEMAKER IMPLANT;  Surgeon: Evans Lance, MD;  Location:  [place] [facility] LAB;  Service: Cardiovascular;  Laterality: N/A;       HPI  from the history and physical done on the day of admission:    [name]  is a 87 y.o. female, with past medical history relevant for chronic atrial fibrillation, HTN,, HLD, rheumatoid arthritis, CVA, L1/L2 fracture status post kyphoplasty, chronic lower back pain, A. fib, uncontrolled in the past requiring multiple DCCV's, requiring AV node ablation and PPM implant in 03/2020. -Patient presents to ED secondary to multiple complaints, mainly failure to thrive, she does report chronic nausea, chronic abdominal pain, inability to keep food down, weight loss, very poor appetite, as well recently she  does start endorsing some shortness of breath, cough as well, but with known history of interstitial lung disease, COPD, as well she has RA, but with no oxygen requirement currently. - in ED was noted to be hypoxic, in the low 80s on room air, chest x-ray was significant for multifocal opacities, she is with known chronic interstitial lung disease, her COVID-19 was positive, ET abdomen pelvis with significant abnormalities which appears to be more chronic , he had hospitalist consulted to admit the patient given her COVID-19 positive status and new oxygen requirement.        Hospital Course:     Brief Narrative:  87 year old female with a history of atrial fibrillation, rheumatoid arthritis, chronic low back pain, chronic abdominal pain followed by GI.  She comes to the emergency room with worsening nausea, abdominal pain, frequent stools.  She also reports shortness of breath and cough.  She was found to be COVID-positive.  She was noted to be hypoxic on room air with increased work of breathing.  Started on remdesivir, steroids and oxygen. Patient is quite weak/frail at baseline. With her significant comorbidities, she was felt to have higher risk of mortality. Fortunately, she appears to be improving. Palliative care following.  discharge to SNF     Assessment & Plan:   Active Problems:   Abdominal pain, chronic, epigastric   Essential hypertension   Atrial fibrillation (HCC)   Chronic anticoagulation/Eliquis for Afib   GERD without esophagitis   Acute respiratory failure (HCC)     Acute respiratory failure with hypoxia -Noted to have oxygen saturations around 82 to 85% on room air.  She was also quite short of breath in conversation. -She does have underlying interstitial lung disease, COPD and now has COVID-19 pneumonia. -Currently requiring 2 L of oxygen via nasal cannula   COVID-19 pneumonia -Due to her shortness of breath, cough, multiple comorbidities and higher risk of  decompensation,  - Completed IV remdesivir and iv steroids -Discharge on p.o. prednisone -Inflammatory markers were not significantly elevated,  -Continue current treatments and pulmonary hygiene/bronchiolitis and mucolytics as advised -clinically, she appears to be improving   Chronic abdominal pain/nausea -She is followed by GI -Plans were for outpatient MRI of abdomen -This will have to be done at [facility] since she has a pacemaker -This can be done as outpatient once respiratory status stabilized further -Continue supportive measures with pain management, PPI, antiemetics -Also, she will likely need EGD for evidence of gastritis on imaging.  This will likely have to be delayed several weeks until her respiratory status/COVID infection have stabilized   Atrial fibrillation -Anticoagulated with Eliquis -She is on metoprolol for rate control   Rheumatoid arthritis -She is chronically on leflunomide    Failure to thrive --Nutritional supplements advised, dietitian consult at SNF recommended   Generalized weakness -  Seen by physical therapy and Occupational Therapy with recommendations for skilled facility placement -patient is agreeable for placement   Goals of care -patient is DNR -palliative care following to engage with family around goals of care and to assist with transitioning to hospice if patient does not make meaningful improvements -Fortunately, it appears that she is starting to improve  -Discussed at bedside with patient's son who is a pastor/minister    DVT prophylaxis: apixaban (ELIQUIS) tablet 2.5 mg Start: 11/08/20 2200 apixaban (ELIQUIS) tablet 2.5 mg  Code Status: DNR Family Communication: Discussed with patient's son who is a Sports administrator at bedside  disposition Plan: Discharged to SNF rehab    Consultants:  Palliative care   Discharge Condition: Stable  Follow UP   Contact information for after-discharge care     [facility] .   Service: Skilled Nursing Contact information: [address] [place] [phone]                     Diet and Activity recommendation:  As advised  Discharge Instructions    Discharge Instructions     Call MD for:  difficulty breathing, headache or visual disturbances   Complete by: As directed    Call MD for:  persistant dizziness or light-headedness   Complete by: As directed    Call MD for:  persistant nausea and vomiting   Complete by: As directed    Call MD for:  temperature >100.4   Complete by: As directed    Diet - low sodium heart healthy   Complete by: As directed    Discharge instructions   Complete by: As directed    1)Avoid ibuprofen/Advil/Aleve/Motrin/Goody Powders/Naproxen/BC powders/Meloxicam/Diclofenac/Indomethacin and other Nonsteroidal anti-inflammatory medications as these will make you more likely to bleed and can cause stomach ulcers, can also cause Kidney problems.   2)Repeat CBC and BMP blood test on Monday 11/18/2020   Increase activity slowly   Complete by: As directed         Discharge Medications     Allergies as of 11/13/2020       Reactions   Iodinated Diagnostic Agents Anaphylaxis   08-12-10 Pt had severe reaction with itching/hives/loss of consciousness within 5 minutes of CEPI today.  She received both Kenalog and Iondinated Contrast, so not certain which agent caused reaction.  If patient returns for another CEPI, Dr. Jobe Igo considers using Decadron and a 13-hour prep.  jkl   Nerve Block Tray Anaphylaxis   Patient states that she was sent to have Epidural for her shoulder. After rec'ving she remembers saying that she was itching all over  And that's it. She was transported to hospital  By EMS. Stayed overnight.   Nitrofuran Derivatives Hives, Nausea And Vomiting   Triamcinolone Acetonide Anaphylaxis   08-12-10  Pt had severe reaction with itching/hives/loss of  consciousness within five minutes of CEPI today.  She received both Kenalog and Iodinated Contrast, so not certain which agent caused reaction.  If pt returns for another cervical epidural steroid injection, Dr. Jobe Igo suggests using Decadron and a 13-hr prep.  JKL PATIENT USES PROCTOCREAM 2.5% WITHOUT DIFFICULTY FOR VULVAR DYSTROPHY Patient says she can take prednisone without any problem   Vibra-tab [doxycycline] Nausea And Vomiting        Medication List     STOP taking these medications    famotidine 20 MG tablet Commonly known as: Pepcid   Fish Oil 1000  MG Caps   hyoscyamine 0.125 MG tablet Commonly known as: Levsin   sucralfate 1 GM/10ML suspension Commonly known as: CARAFATE       TAKE these medications    acetaminophen 500 MG tablet Commonly known as: TYLENOL Take 500 mg by mouth 2 (two) times daily.   albuterol 108 (90 Base) MCG/ACT inhaler Commonly known as: VENTOLIN HFA Inhale 2 puffs into the lungs every 4 (four) hours as needed for wheezing or shortness of breath.   apixaban 2.5 MG Tabs tablet Commonly known as: ELIQUIS Take 1 tablet (2.5 mg total) by mouth 2 (two) times daily.   calcium carbonate 500 MG chewable tablet Commonly known as: Tums Chew 1 tablet (200 mg of elemental calcium total) by mouth 2 (two) times daily with a meal. What changed:  medication strength how much to take when to take this   feeding supplement Liqd Take 237 mLs by mouth 2 (two) times daily between meals.   guaiFENesin-dextromethorphan 100-10 MG/5ML syrup Commonly known as: ROBITUSSIN DM Take 10 mLs by mouth every 4 (four) hours as needed for cough.   HYDROcodone bit-homatropine 5-1.5 MG/5ML syrup Commonly known as: HYCODAN Take 5 mLs by mouth every 4 (four) hours as needed for cough.   HYDROcodone-acetaminophen 7.5-325 MG tablet Commonly known as: NORCO Take 1 tablet by mouth every 8 (eight) hours as needed for moderate pain. What changed: when to take this    Ipratropium-Albuterol 20-100 MCG/ACT Aers respimat Commonly known as: COMBIVENT Inhale 1 puff into the lungs every 6 (six) hours as needed for wheezing or shortness of breath (cough).   leflunomide 20 MG tablet Commonly known as: ARAVA Take 1 tablet (20 mg total) by mouth daily.   Magnesium Oxide 400 MG Caps Take 1 capsule (400 mg total) by mouth daily.   melatonin 3 MG Tabs tablet Take 2 tablets (6 mg total) by mouth at bedtime.   metoprolol succinate 25 MG 24 hr tablet Commonly known as: Toprol XL Take 1 tablet (25 mg total) by mouth daily.   multivitamin with minerals Tabs tablet Take 1 tablet by mouth daily. Start taking on: November 14, 2020   omeprazole 40 MG capsule Commonly known as: PRILOSEC Take 1 capsule (40 mg total) by mouth 2 (two) times daily.   predniSONE 20 MG tablet Commonly known as: DELTASONE Take 2 tablets (40 mg total) by mouth daily with breakfast for 5 days.   rosuvastatin 20 MG tablet Commonly known as: CRESTOR Take 1 tablet (20 mg total) by mouth daily at 6 PM.   torsemide 20 MG tablet Commonly known as: DEMADEX Take 20 mg by mouth daily.   vitamin C 250 MG tablet Commonly known as: ASCORBIC ACID Take 1 tablet (250 mg total) by mouth daily.   zinc gluconate 50 MG tablet Take 1 tablet (50 mg total) by mouth daily.        Major procedures and Radiology Reports - PLEASE review detailed and final reports for all details, in brief -   CT ABDOMEN PELVIS WO CONTRAST  Result Date: 11/08/2020 CLINICAL DATA:  87 year old with acute nonlocalized abdominal pain. Nausea. Patient reports unable to eat for 2 weeks. EXAM: CT ABDOMEN AND PELVIS WITHOUT CONTRAST TECHNIQUE: Multidetector CT imaging of the abdomen and pelvis was performed following the standard protocol without IV contrast. COMPARISON:  None. FINDINGS: Lower chest: Breathing motion artifact with interstitial coarsening and subpleural reticulation. Trace right pleural thickening. There is  bronchiolectasis in the medial right lower lobe. Pacemaker wires are partially  included mitral annulus and coronary artery calcifications. Hepatobiliary: No focal lesion on this unenhanced exam. Occasional calcified granuloma. There is questionable capsular nodularity involving the left hepatic lobe. Cholecystectomy. Common bile duct measures 12 mm. No visualized choledocholithiasis. Pancreas: Fatty atrophy. No ductal dilatation or inflammation. No evidence of pancreatic mass on this unenhanced exam. Spleen: Normal in size without focal abnormality. Adrenals/Urinary Tract: Mild adrenal thickening without dominant adrenal nodule. There is no hydronephrosis, renal calculi, or focal renal abnormality. No evidence of focal renal lesion on this unenhanced exam. Unremarkable urinary bladder Stomach/Bowel: Small posterior gastric cardia diverticulum. Suggestion of diffuse gastric wall thickening. Wall thickening about the greater curvature of 18 mm. Lack of gastric distension limits detailed assessment. No small bowel obstruction or inflammatory change. Administered enteric contrast is seen throughout the colon. Suspected right hemicolectomy with enteric anastomosis in the right upper quadrant. Distal colonic diverticulosis without diverticulitis. There may be circumferential rectal wall thickening, series 2, image 88. Vascular/Lymphatic: Advanced aortic atherosclerosis. No aortic aneurysm. No portal venous or mesenteric gas. No enlarged lymph nodes in the abdomen or pelvis. Reproductive: Status post hysterectomy. No adnexal masses. Other: No free air or ascites.  No abdominal wall hernia. Musculoskeletal: L1 and L2 compression fractures with vertebral augmentation. The bones are diffusely under mineralized. No acute osseous abnormalities are seen. IMPRESSION: 1. Suggestion of diffuse gastric wall thickening, including the greater curvature. This may represent gastritis or peptic ulcer disease, neoplastic involvement not  excluded. Recommend correlation with endoscopy. 2. Possible circumferential rectal wall thickening, recommend correlation with physical exam. 3. Cholecystectomy with common bile duct dilatation measuring 12 mm, likely secondary to prior cholecystectomy. Recommend correlation with LFTs. 4. Colonic diverticulosis without diverticulitis. 5. Possible capsular nodularity involving the left hepatic lobe, can be seen in the setting of early cirrhosis. Recommend correlation with cirrhosis risk factors. Aortic Atherosclerosis (ICD10-I70.0). Electronically Signed   By: Keith Rake M.D.   On: 11/08/2020 17:07   DG Chest 2 View  Result Date: 11/08/2020 CLINICAL DATA:  Cough, shortness of breath EXAM: CHEST - 2 VIEW COMPARISON:  04/09/2020 FINDINGS: The heart size and mediastinal contours are within normal limits. Left chest single lead pacer. Unchanged elevation of the right hemidiaphragm. Mild, diffuse bilateral interstitial pulmonary opacity. The visualized skeletal structures are unremarkable. IMPRESSION: Mild, diffuse bilateral interstitial pulmonary opacity, likely edema. No focal airspace opacity. Electronically Signed   By: Delanna Ahmadi M.D.   On: 11/08/2020 14:08    Micro Results   Recent Results (from the past 240 hour(s))  Resp Panel by RT-PCR (Flu A&B, Covid) Nasopharyngeal Swab     Status: Abnormal   Collection Time: 11/08/20  1:20 PM   Specimen: Nasopharyngeal Swab; Nasopharyngeal(NP) swabs in vial transport medium  Result Value Ref Range Status   SARS Coronavirus 2 by RT PCR POSITIVE (A) NEGATIVE Final    Comment: RESULT CALLED TO, READ BACK BY AND VERIFIED WITH: H CRAWFORD RN [id number] K FORSYTH (NOTE) SARS-CoV-2 target nucleic acids are DETECTED.  The SARS-CoV-2 RNA is generally detectable in upper respiratory specimens during the acute phase of infection. Positive results are indicative of the presence of the identified virus, but do not rule out bacterial infection or  co-infection with other pathogens not detected by the test. Clinical correlation with patient history and other diagnostic information is necessary to determine patient infection status. The expected result is Negative.  Fact Sheet for Patients: EntrepreneurPulse.com.au  Fact Sheet for Healthcare Providers: IncredibleEmployment.be  This test is not yet approved or cleared by  the [employer] and  has been authorized for detection and/or diagnosis of SARS-CoV-2 by FDA under an Emergency Use Authorization (EUA).  This EUA will remain in effect (meaning this test can  be used) for the duration of  the COVID-19 declaration under Section 564(b)(1) of the Act, 21 U.S.C. section 360bbb-3(b)(1), unless the authorization is terminated or revoked sooner.     Influenza A by PCR NEGATIVE NEGATIVE Final   Influenza B by PCR NEGATIVE NEGATIVE Final    Comment: (NOTE) The Xpert Xpress SARS-CoV-2/FLU/RSV plus assay is intended as an aid in the diagnosis of influenza from Nasopharyngeal swab specimens and should not be used as a sole basis for treatment. Nasal washings and aspirates are unacceptable for Xpert Xpress SARS-CoV-2/FLU/RSV testing.  Fact Sheet for Patients: EntrepreneurPulse.com.au  Fact Sheet for Healthcare Providers: IncredibleEmployment.be  This test is not yet approved or cleared by the [place] FDA and has been authorized for detection and/or diagnosis of SARS-CoV-2 by FDA under an Emergency Use Authorization (EUA). This EUA will remain in effect (meaning this test can be used) for the duration of the COVID-19 declaration under Section 564(b)(1) of the Act, 21 U.S.C. section 360bbb-3(b)(1), unless the authorization is terminated or revoked.  Performed at [facility], [address], [place], [place] [address]     Today   Devola today has no new complaints -Cough,  shortness of breath and hypoxia persist -Patient's son who is a pastor/mental status at bedside, questions answered          Patient has been seen and examined prior to discharge   Objective   Blood pressure (!) 142/61, pulse 69, temperature 98.1 F (36.7 C), temperature source Oral, resp. rate 20, height 5\' 2"  (1.575 m), weight 49.7 kg, SpO2 97 %.   Intake/Output Summary (Last 24 hours) at 11/13/2020 1435 Last data filed at 11/13/2020 0900 Gross per 24 hour  Intake 780 ml  Output 400 ml  Net 380 ml    Exam Gen:- Awake Alert, intermittent cough HEENT:- [email or web address].AT, No sclera icterus Nose- Ali Molina 2L/min Neck-Supple Neck,No JVD,.  Lungs-diminished bases, scattered rhonchi and wheezes  CV- S1, S2 normal, regular Abd-  +ve B.Sounds, Abd Soft, No tenderness,    Extremity/Skin:- No  edema,   good pulses Psych-affect is appropriate, oriented x3 Neuro-generalized weakness, no new focal deficits, no tremors    Data Review   CBC w Diff:  Lab Results  Component Value Date   WBC 8.1 11/13/2020   HGB 10.3 (L) 11/13/2020   HGB 10.6 (L) 03/14/2020   HCT 32.7 (L) 11/13/2020   HCT 31.6 (L) 03/14/2020   PLT 154 11/13/2020   PLT 226 03/14/2020   LYMPHOPCT 6 11/13/2020   MONOPCT 4 11/13/2020   EOSPCT 0 11/13/2020   BASOPCT 1 11/13/2020    CMP:  Lab Results  Component Value Date   NA 140 11/13/2020   K 4.5 11/13/2020   CL 104 11/13/2020   CO2 29 11/13/2020   BUN 26 (H) 11/13/2020   CREATININE 0.65 11/13/2020   CREATININE 0.65 08/10/2013   PROT 5.4 (L) 11/13/2020   ALBUMIN 2.6 (L) 11/13/2020   BILITOT 0.2 (L) 11/13/2020   ALKPHOS 55 11/13/2020   AST 50 (H) 11/13/2020   ALT 39 11/13/2020  .   Total Discharge time is about 33 minutes  Roxan Hockey M.D on 11/13/2020 at 2:35 PM  Go to [email or web address] -  for contact info  [facility] - Office  [phone]   

## 2020-11-13 NOTE — Patient Instructions (Signed)

## 2020-11-13 NOTE — Progress Notes (Signed)
EMS to pick up patient for discharge back to [place]. IV removed and catheter intact. Telemetry discontinued. Purewick removed.

## 2020-11-14 DIAGNOSIS — J441 Chronic obstructive pulmonary disease with (acute) exacerbation: Secondary | ICD-10-CM | POA: Diagnosis not present

## 2020-11-14 DIAGNOSIS — R109 Unspecified abdominal pain: Secondary | ICD-10-CM | POA: Diagnosis not present

## 2020-11-14 DIAGNOSIS — I4891 Unspecified atrial fibrillation: Secondary | ICD-10-CM | POA: Diagnosis not present

## 2020-11-14 DIAGNOSIS — Z79899 Other long term (current) drug therapy: Secondary | ICD-10-CM | POA: Diagnosis not present

## 2020-11-14 DIAGNOSIS — K219 Gastro-esophageal reflux disease without esophagitis: Secondary | ICD-10-CM | POA: Diagnosis not present

## 2020-11-14 DIAGNOSIS — I10 Essential (primary) hypertension: Secondary | ICD-10-CM | POA: Diagnosis not present

## 2020-11-14 DIAGNOSIS — U071 COVID-19: Secondary | ICD-10-CM | POA: Diagnosis not present

## 2020-11-14 DIAGNOSIS — J9600 Acute respiratory failure, unspecified whether with hypoxia or hypercapnia: Secondary | ICD-10-CM | POA: Diagnosis not present

## 2020-11-15 ENCOUNTER — Other Ambulatory Visit: Payer: Self-pay | Admitting: *Deleted

## 2020-11-15 NOTE — Patient Outreach (Signed)
[facility]) Care Management  11/15/2020  [name] 24-Oct-1933 [id number]   Referral received as member was recently discharged from hospital.  Noted that member discharged to [facility] for SNF, call placed to facility to confirm.  Will close case at this time, anticipate new referral pending disposition from SNF.  Valente David, [place], MSN [facility] [phone]

## 2020-11-18 DIAGNOSIS — E46 Unspecified protein-calorie malnutrition: Secondary | ICD-10-CM | POA: Diagnosis not present

## 2020-11-18 DIAGNOSIS — I4891 Unspecified atrial fibrillation: Secondary | ICD-10-CM | POA: Diagnosis not present

## 2020-11-18 DIAGNOSIS — J441 Chronic obstructive pulmonary disease with (acute) exacerbation: Secondary | ICD-10-CM | POA: Diagnosis not present

## 2020-11-18 DIAGNOSIS — K219 Gastro-esophageal reflux disease without esophagitis: Secondary | ICD-10-CM | POA: Diagnosis not present

## 2020-11-18 DIAGNOSIS — J449 Chronic obstructive pulmonary disease, unspecified: Secondary | ICD-10-CM | POA: Diagnosis not present

## 2020-11-18 DIAGNOSIS — R109 Unspecified abdominal pain: Secondary | ICD-10-CM | POA: Diagnosis not present

## 2020-11-18 DIAGNOSIS — I10 Essential (primary) hypertension: Secondary | ICD-10-CM | POA: Diagnosis not present

## 2020-11-18 DIAGNOSIS — U071 COVID-19: Secondary | ICD-10-CM | POA: Diagnosis not present

## 2020-11-18 DIAGNOSIS — Z515 Encounter for palliative care: Secondary | ICD-10-CM | POA: Diagnosis not present

## 2020-11-18 DIAGNOSIS — J9600 Acute respiratory failure, unspecified whether with hypoxia or hypercapnia: Secondary | ICD-10-CM | POA: Diagnosis not present

## 2020-11-18 DIAGNOSIS — Z79899 Other long term (current) drug therapy: Secondary | ICD-10-CM | POA: Diagnosis not present

## 2020-12-12 DEATH — deceased

## 2020-12-30 ENCOUNTER — Ambulatory Visit (INDEPENDENT_AMBULATORY_CARE_PROVIDER_SITE_OTHER): Payer: Medicare Other | Admitting: Gastroenterology

## 2021-02-04 ENCOUNTER — Ambulatory Visit: Payer: Medicare Other | Admitting: Student

## 2021-02-08 IMAGING — DX DG CHEST 1V PORT
1 series · 1 of 1 positions shown · non-contrast
Comparison: 09/28/2019 chest radiograph.

CLINICAL DATA: Dyspnea, arrhythmia

EXAM:
PORTABLE CHEST 1 VIEW

[chest ap]
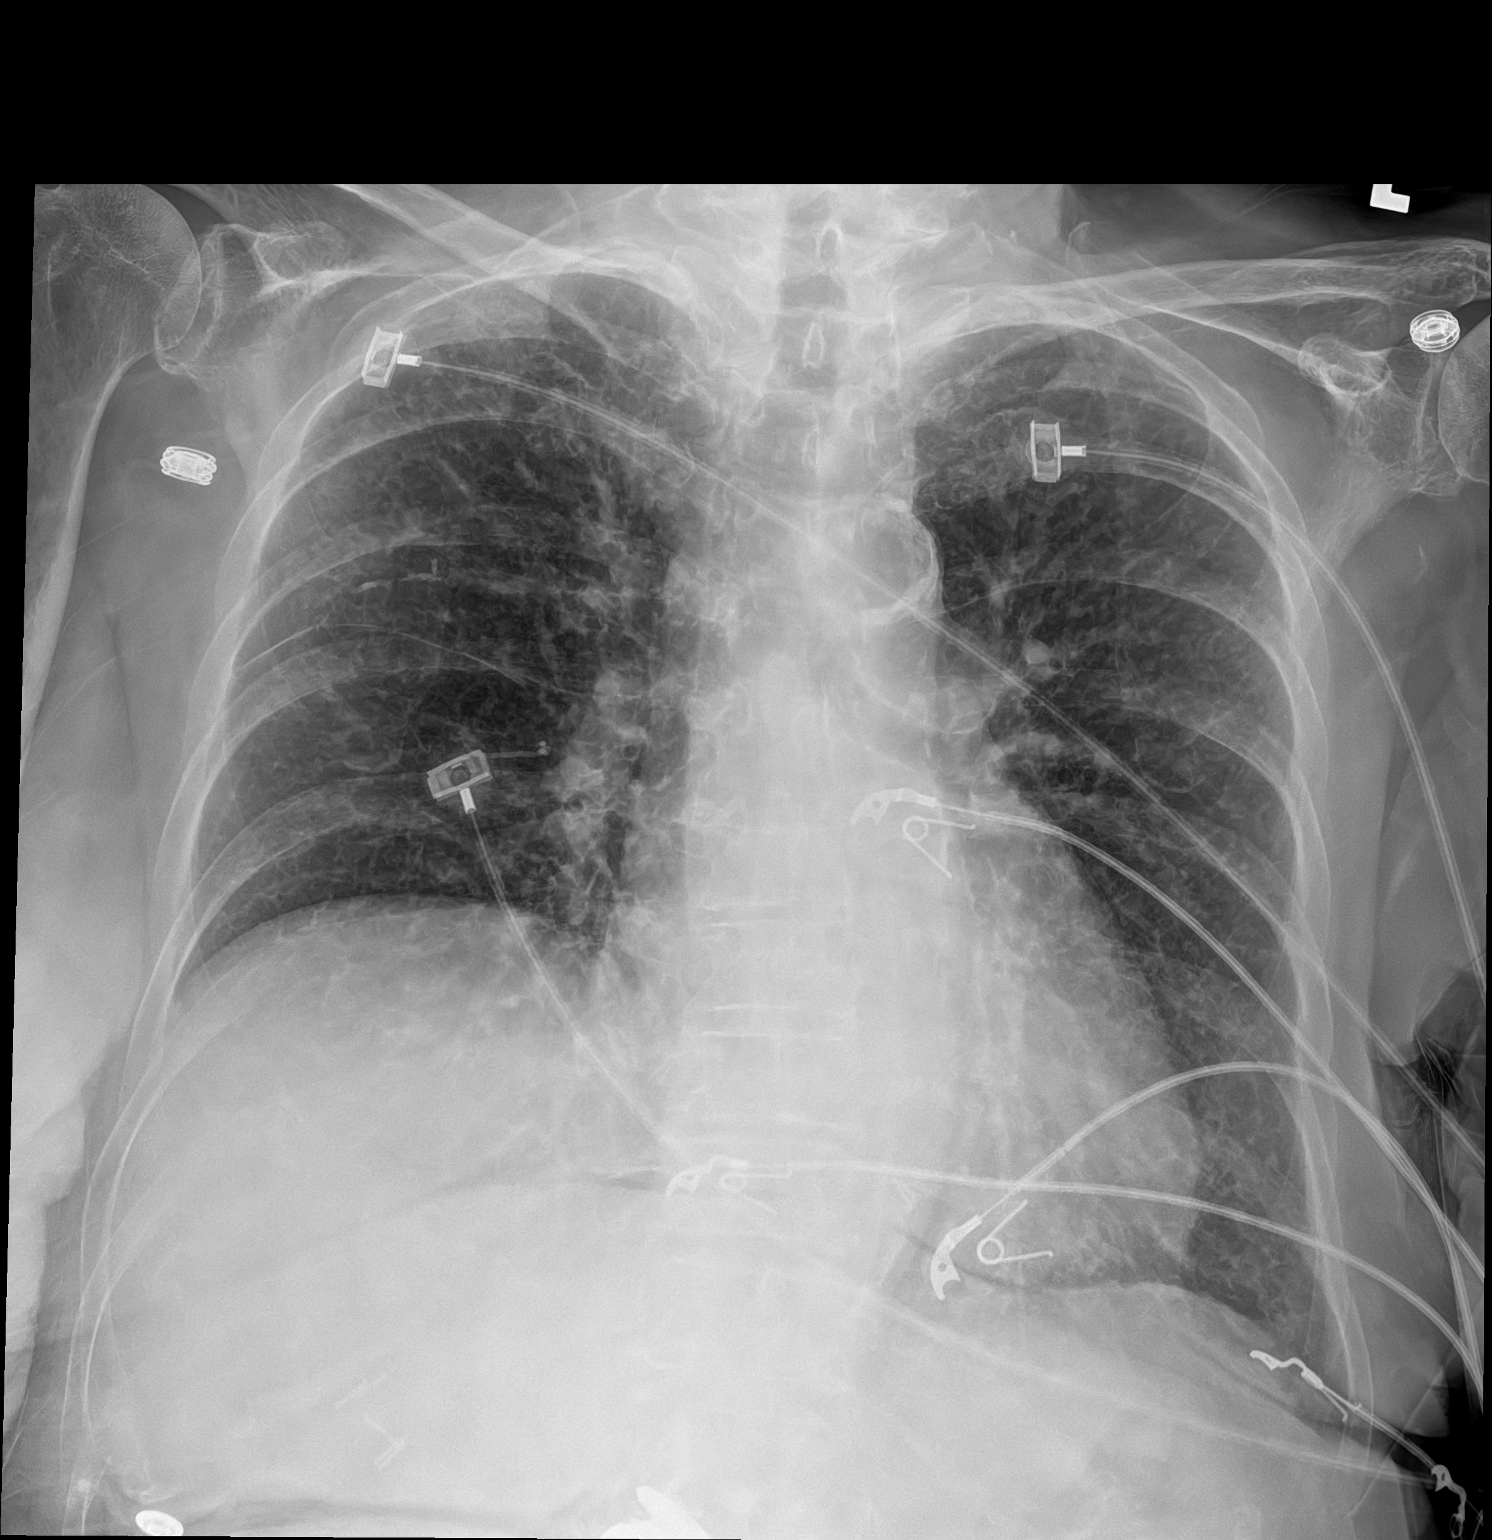

[1 of 1 positions shown; findings below may reference images not displayed]

FINDINGS: Stable cardiomediastinal silhouette with normal heart size. No
pneumothorax. No pleural effusion. Stable mild elevation of the
right hemidiaphragm. No pulmonary edema. No acute consolidative
airspace disease.
IMPRESSION: No active disease.

## 2022-02-19 ENCOUNTER — Encounter (HOSPITAL_COMMUNITY): Payer: Self-pay | Admitting: *Deleted
